# Patient Record
Sex: Male | Born: 1950
Health system: Southern US, Community
[De-identification: ages and names within clinical notes are randomized; demographics above are authoritative.]

## PROBLEM LIST (undated history)

## (undated) VITALS — BP 142/90 | HR 62 | Ht 66.0 in | Wt 197.5 lb

## (undated) DIAGNOSIS — N318 Other neuromuscular dysfunction of bladder: Secondary | ICD-10-CM

## (undated) DIAGNOSIS — I10 Essential (primary) hypertension: Secondary | ICD-10-CM

## (undated) DIAGNOSIS — E785 Hyperlipidemia, unspecified: Secondary | ICD-10-CM

## (undated) DIAGNOSIS — C61 Malignant neoplasm of prostate: Secondary | ICD-10-CM

## (undated) DIAGNOSIS — K219 Gastro-esophageal reflux disease without esophagitis: Secondary | ICD-10-CM

## (undated) DIAGNOSIS — G43009 Migraine without aura, not intractable, without status migrainosus: Secondary | ICD-10-CM

## (undated) DIAGNOSIS — N400 Enlarged prostate without lower urinary tract symptoms: Secondary | ICD-10-CM

## (undated) HISTORY — DX: Migraine without aura, not intractable, without status migrainosus: G43.009

## (undated) HISTORY — DX: Hyperlipidemia, unspecified: E78.5

## (undated) HISTORY — DX: Essential (primary) hypertension: I10

## (undated) HISTORY — DX: Gastro-esophageal reflux disease without esophagitis: K21.9

## (undated) HISTORY — DX: Other neuromuscular dysfunction of bladder: N31.8

## (undated) HISTORY — DX: Benign prostatic hyperplasia without lower urinary tract symptoms: N40.0

---

## 2001-10-01 ENCOUNTER — Encounter: Payer: Self-pay | Admitting: Internal Medicine

## 2001-10-01 ENCOUNTER — Encounter: Admission: RE | Admit: 2001-10-01 | Discharge: 2001-10-01 | Payer: Self-pay | Admitting: Internal Medicine

## 2002-09-11 ENCOUNTER — Ambulatory Visit (HOSPITAL_COMMUNITY): Admission: RE | Admit: 2002-09-11 | Discharge: 2002-09-11 | Payer: Self-pay | Admitting: Gastroenterology

## 2005-12-02 ENCOUNTER — Emergency Department (HOSPITAL_COMMUNITY): Admission: EM | Admit: 2005-12-02 | Discharge: 2005-12-02 | Payer: Self-pay | Admitting: Emergency Medicine

## 2005-12-19 ENCOUNTER — Ambulatory Visit: Payer: Self-pay | Admitting: Internal Medicine

## 2005-12-19 LAB — CONVERTED CEMR LAB: PSA: 1.06 ng/mL

## 2005-12-27 ENCOUNTER — Ambulatory Visit: Payer: Self-pay | Admitting: Internal Medicine

## 2006-01-11 ENCOUNTER — Ambulatory Visit: Payer: Self-pay

## 2006-03-08 ENCOUNTER — Ambulatory Visit: Payer: Self-pay | Admitting: Internal Medicine

## 2006-06-06 ENCOUNTER — Ambulatory Visit: Payer: Self-pay | Admitting: Internal Medicine

## 2007-01-04 ENCOUNTER — Encounter: Payer: Self-pay | Admitting: Internal Medicine

## 2007-01-04 DIAGNOSIS — N410 Acute prostatitis: Secondary | ICD-10-CM

## 2007-01-04 DIAGNOSIS — I10 Essential (primary) hypertension: Secondary | ICD-10-CM

## 2007-01-04 DIAGNOSIS — R0989 Other specified symptoms and signs involving the circulatory and respiratory systems: Secondary | ICD-10-CM | POA: Insufficient documentation

## 2007-01-04 DIAGNOSIS — E7800 Pure hypercholesterolemia, unspecified: Secondary | ICD-10-CM | POA: Insufficient documentation

## 2007-01-04 DIAGNOSIS — E785 Hyperlipidemia, unspecified: Secondary | ICD-10-CM

## 2007-01-04 HISTORY — DX: Hyperlipidemia, unspecified: E78.5

## 2007-01-04 HISTORY — DX: Essential (primary) hypertension: I10

## 2007-01-08 ENCOUNTER — Ambulatory Visit: Payer: Self-pay | Admitting: Internal Medicine

## 2007-01-08 LAB — CONVERTED CEMR LAB
Bilirubin Urine: NEGATIVE
Leukocytes, UA: NEGATIVE
Nitrite: NEGATIVE
Specific Gravity, Urine: 1.01 (ref 1.000–1.03)
Urine Glucose: NEGATIVE mg/dL
Urobilinogen, UA: 0.2 (ref 0.0–1.0)
pH: 7 (ref 5.0–8.0)

## 2007-03-06 ENCOUNTER — Ambulatory Visit: Payer: Self-pay | Admitting: Internal Medicine

## 2007-03-06 ENCOUNTER — Encounter: Payer: Self-pay | Admitting: Internal Medicine

## 2007-03-06 DIAGNOSIS — N318 Other neuromuscular dysfunction of bladder: Secondary | ICD-10-CM

## 2007-03-06 HISTORY — DX: Other neuromuscular dysfunction of bladder: N31.8

## 2007-03-07 LAB — CONVERTED CEMR LAB
ALT: 23 units/L (ref 0–53)
AST: 24 units/L (ref 0–37)
Bacteria, UA: NEGATIVE
Basophils Absolute: 0.1 10*3/uL (ref 0.0–0.1)
Bilirubin Urine: NEGATIVE
Bilirubin, Direct: 0.1 mg/dL (ref 0.0–0.3)
CO2: 31 meq/L (ref 19–32)
Chloride: 102 meq/L (ref 96–112)
Creatinine, Ser: 0.9 mg/dL (ref 0.4–1.5)
Eosinophils Absolute: 0.1 10*3/uL (ref 0.0–0.6)
Eosinophils Relative: 1.4 % (ref 0.0–5.0)
Glucose, Bld: 104 mg/dL — ABNORMAL HIGH (ref 70–99)
HCT: 43.6 % (ref 39.0–52.0)
Hemoglobin: 14.9 g/dL (ref 13.0–17.0)
MCHC: 34.3 g/dL (ref 30.0–36.0)
MCV: 92.6 fL (ref 78.0–100.0)
Monocytes Absolute: 0.6 10*3/uL (ref 0.2–0.7)
Mucus, UA: NEGATIVE
Neutrophils Relative %: 64.4 % (ref 43.0–77.0)
Nitrite: NEGATIVE
PSA: 1.07 ng/mL (ref 0.10–4.00)
RDW: 13.1 % (ref 11.5–14.6)
Sodium: 139 meq/L (ref 135–145)
Total Bilirubin: 0.8 mg/dL (ref 0.3–1.2)
Total Protein, Urine: NEGATIVE mg/dL
Total Protein: 8.2 g/dL (ref 6.0–8.3)
pH: 7 (ref 5.0–8.0)

## 2007-04-01 ENCOUNTER — Telehealth (INDEPENDENT_AMBULATORY_CARE_PROVIDER_SITE_OTHER): Payer: Self-pay | Admitting: *Deleted

## 2007-04-02 ENCOUNTER — Telehealth (INDEPENDENT_AMBULATORY_CARE_PROVIDER_SITE_OTHER): Payer: Self-pay | Admitting: *Deleted

## 2007-05-09 HISTORY — PX: COLONOSCOPY: SHX174

## 2007-05-15 ENCOUNTER — Ambulatory Visit: Payer: Self-pay | Admitting: Internal Medicine

## 2007-06-24 ENCOUNTER — Ambulatory Visit: Payer: Self-pay | Admitting: Cardiovascular Disease

## 2007-06-24 ENCOUNTER — Inpatient Hospital Stay (HOSPITAL_COMMUNITY): Admission: EM | Admit: 2007-06-24 | Discharge: 2007-06-26 | Payer: Self-pay | Admitting: Emergency Medicine

## 2007-07-05 ENCOUNTER — Ambulatory Visit: Payer: Self-pay | Admitting: Internal Medicine

## 2007-07-31 ENCOUNTER — Ambulatory Visit: Payer: Self-pay | Admitting: Internal Medicine

## 2007-07-31 DIAGNOSIS — G43009 Migraine without aura, not intractable, without status migrainosus: Secondary | ICD-10-CM

## 2007-07-31 DIAGNOSIS — K219 Gastro-esophageal reflux disease without esophagitis: Secondary | ICD-10-CM

## 2007-07-31 DIAGNOSIS — R1013 Epigastric pain: Secondary | ICD-10-CM

## 2007-07-31 HISTORY — DX: Gastro-esophageal reflux disease without esophagitis: K21.9

## 2007-07-31 HISTORY — DX: Migraine without aura, not intractable, without status migrainosus: G43.009

## 2007-07-31 LAB — CONVERTED CEMR LAB
Alkaline Phosphatase: 62 units/L (ref 39–117)
Bilirubin, Direct: 0.2 mg/dL (ref 0.0–0.3)
H Pylori IgG: NEGATIVE
LDL Cholesterol: 78 mg/dL (ref 0–99)
Lipase: 19 units/L (ref 11.0–59.0)
Total CHOL/HDL Ratio: 2.5
VLDL: 11 mg/dL (ref 0–40)

## 2007-08-22 ENCOUNTER — Ambulatory Visit: Payer: Self-pay

## 2007-08-22 ENCOUNTER — Ambulatory Visit: Payer: Self-pay | Admitting: Cardiovascular Disease

## 2007-10-07 ENCOUNTER — Ambulatory Visit: Payer: Self-pay | Admitting: Internal Medicine

## 2007-10-22 ENCOUNTER — Encounter: Payer: Self-pay | Admitting: Internal Medicine

## 2007-10-22 ENCOUNTER — Ambulatory Visit: Payer: Self-pay | Admitting: Internal Medicine

## 2007-10-23 ENCOUNTER — Encounter: Payer: Self-pay | Admitting: Internal Medicine

## 2008-04-13 ENCOUNTER — Ambulatory Visit: Payer: Self-pay | Admitting: Internal Medicine

## 2008-04-13 LAB — CONVERTED CEMR LAB
Albumin: 4.2 g/dL (ref 3.5–5.2)
Alkaline Phosphatase: 61 units/L (ref 39–117)
BUN: 12 mg/dL (ref 6–23)
Basophils Absolute: 0 10*3/uL (ref 0.0–0.1)
Basophils Relative: 0.5 % (ref 0.0–3.0)
Calcium: 9.6 mg/dL (ref 8.4–10.5)
Cholesterol: 182 mg/dL (ref 0–200)
Creatinine, Ser: 0.9 mg/dL (ref 0.4–1.5)
Crystals: NEGATIVE
Eosinophils Absolute: 0.1 10*3/uL (ref 0.0–0.7)
Eosinophils Relative: 0.8 % (ref 0.0–5.0)
GFR calc Af Amer: 111 mL/min
GFR calc non Af Amer: 92 mL/min
Glucose, Bld: 99 mg/dL (ref 70–99)
HCT: 42 % (ref 39.0–52.0)
HDL: 70.3 mg/dL (ref >39.0)
Ketones, ur: NEGATIVE mg/dL
Leukocytes, UA: NEGATIVE
MCHC: 34.6 g/dL (ref 30.0–36.0)
MCV: 93.3 fL (ref 78.0–100.0)
Monocytes Absolute: 0.6 10*3/uL (ref 0.1–1.0)
Neutrophils Relative %: 63.4 % (ref 43.0–77.0)
PSA: 1.21 ng/mL (ref 0.10–4.00)
Platelets: 254 10*3/uL (ref 150–400)
Potassium: 4.2 meq/L (ref 3.5–5.1)
RBC: 4.5 M/uL (ref 4.22–5.81)
Specific Gravity, Urine: 1.01 (ref 1.000–1.03)
Total Protein: 7.8 g/dL (ref 6.0–8.3)
Urine Glucose: NEGATIVE mg/dL
Urobilinogen, UA: 0.2 (ref 0.0–1.0)
VLDL: 14 mg/dL (ref 0–40)
WBC: 8.2 10*3/uL (ref 4.5–10.5)

## 2008-04-20 ENCOUNTER — Encounter: Payer: Self-pay | Admitting: Internal Medicine

## 2008-04-20 ENCOUNTER — Ambulatory Visit: Payer: Self-pay | Admitting: Internal Medicine

## 2008-11-30 ENCOUNTER — Ambulatory Visit: Payer: Self-pay | Admitting: Internal Medicine

## 2008-11-30 DIAGNOSIS — R350 Frequency of micturition: Secondary | ICD-10-CM

## 2008-11-30 DIAGNOSIS — L989 Disorder of the skin and subcutaneous tissue, unspecified: Secondary | ICD-10-CM | POA: Insufficient documentation

## 2008-11-30 LAB — CONVERTED CEMR LAB
Albumin: 4.3 g/dL (ref 3.5–5.2)
BUN: 14 mg/dL (ref 6–23)
Bilirubin Urine: NEGATIVE
CO2: 30 meq/L (ref 19–32)
Chloride: 106 meq/L (ref 96–112)
Cholesterol: 201 mg/dL — ABNORMAL HIGH (ref 0–200)
Direct LDL: 115.6 mg/dL
Nitrite: NEGATIVE
Potassium: 3.9 meq/L (ref 3.5–5.1)
Total CHOL/HDL Ratio: 3
Total Protein, Urine: 30 mg/dL
Triglycerides: 74 mg/dL (ref 0.0–149.0)
Urobilinogen, UA: 1 (ref 0.0–1.0)
VLDL: 14.8 mg/dL (ref 0.0–40.0)

## 2008-12-16 ENCOUNTER — Encounter: Payer: Self-pay | Admitting: Internal Medicine

## 2009-04-29 ENCOUNTER — Ambulatory Visit: Payer: Self-pay | Admitting: Internal Medicine

## 2009-04-29 LAB — CONVERTED CEMR LAB
Albumin: 4.4 g/dL (ref 3.5–5.2)
Alkaline Phosphatase: 61 units/L (ref 39–117)
BUN: 9 mg/dL (ref 6–23)
Basophils Absolute: 0 10*3/uL (ref 0.0–0.1)
CO2: 29 meq/L (ref 19–32)
Calcium: 9.4 mg/dL (ref 8.4–10.5)
Creatinine, Ser: 1 mg/dL (ref 0.4–1.5)
Eosinophils Absolute: 0 10*3/uL (ref 0.0–0.7)
Glucose, Bld: 100 mg/dL — ABNORMAL HIGH (ref 70–99)
HDL: 67 mg/dL (ref >39.00)
Lymphocytes Relative: 32.8 % (ref 12.0–46.0)
MCHC: 33.1 g/dL (ref 30.0–36.0)
Neutro Abs: 4.2 10*3/uL (ref 1.4–7.7)
Neutrophils Relative %: 57.9 % (ref 43.0–77.0)
Nitrite: NEGATIVE
Platelets: 271 10*3/uL (ref 150.0–400.0)
RDW: 13.3 % (ref 11.5–14.6)
Total Protein, Urine: NEGATIVE mg/dL
Triglycerides: 68 mg/dL (ref 0.0–149.0)
Urobilinogen, UA: 0.2 (ref 0.0–1.0)

## 2009-05-05 ENCOUNTER — Ambulatory Visit: Payer: Self-pay | Admitting: Internal Medicine

## 2009-06-14 ENCOUNTER — Ambulatory Visit: Payer: Self-pay | Admitting: Internal Medicine

## 2009-06-14 DIAGNOSIS — J069 Acute upper respiratory infection, unspecified: Secondary | ICD-10-CM | POA: Insufficient documentation

## 2009-06-14 DIAGNOSIS — R062 Wheezing: Secondary | ICD-10-CM

## 2009-06-22 ENCOUNTER — Encounter: Payer: Self-pay | Admitting: Internal Medicine

## 2009-07-28 ENCOUNTER — Ambulatory Visit: Payer: Self-pay | Admitting: Internal Medicine

## 2009-07-28 DIAGNOSIS — R109 Unspecified abdominal pain: Secondary | ICD-10-CM | POA: Insufficient documentation

## 2009-07-28 DIAGNOSIS — R0602 Shortness of breath: Secondary | ICD-10-CM

## 2009-07-28 LAB — CONVERTED CEMR LAB
ALT: 29 units/L (ref 0–53)
Alkaline Phosphatase: 60 units/L (ref 39–117)
Basophils Relative: 0.6 % (ref 0.0–3.0)
Bilirubin, Direct: 0.1 mg/dL (ref 0.0–0.3)
Calcium: 9.5 mg/dL (ref 8.4–10.5)
Chloride: 104 meq/L (ref 96–112)
Creatinine, Ser: 0.9 mg/dL (ref 0.4–1.5)
Eosinophils Relative: 0.5 % (ref 0.0–5.0)
GFR calc non Af Amer: 110.25 mL/min (ref >60)
Lymphocytes Relative: 36.5 % (ref 12.0–46.0)
Monocytes Relative: 8.6 % (ref 3.0–12.0)
Neutrophils Relative %: 53.8 % (ref 43.0–77.0)
RBC: 4.58 M/uL (ref 4.22–5.81)
Total Protein: 7.7 g/dL (ref 6.0–8.3)
WBC: 7.2 10*3/uL (ref 4.5–10.5)

## 2009-08-03 ENCOUNTER — Encounter: Admission: RE | Admit: 2009-08-03 | Discharge: 2009-08-03 | Payer: Self-pay | Admitting: Internal Medicine

## 2010-02-28 ENCOUNTER — Encounter: Payer: Self-pay | Admitting: Internal Medicine

## 2010-04-29 ENCOUNTER — Ambulatory Visit: Payer: Self-pay | Admitting: Internal Medicine

## 2010-05-03 LAB — CONVERTED CEMR LAB
Alkaline Phosphatase: 70 units/L (ref 39–117)
Basophils Absolute: 0 10*3/uL (ref 0.0–0.1)
Bilirubin Urine: NEGATIVE
Bilirubin, Direct: 0.1 mg/dL (ref 0.0–0.3)
Calcium: 9.6 mg/dL (ref 8.4–10.5)
Cholesterol: 246 mg/dL — ABNORMAL HIGH (ref 0–200)
Creatinine, Ser: 1 mg/dL (ref 0.4–1.5)
Direct LDL: 164.8 mg/dL
Eosinophils Absolute: 0 10*3/uL (ref 0.0–0.7)
GFR calc non Af Amer: 103.32 mL/min (ref >60.00)
HCT: 45.4 % (ref 39.0–52.0)
HDL: 66.7 mg/dL (ref >39.00)
Ketones, ur: NEGATIVE mg/dL
Leukocytes, UA: NEGATIVE
Lymphs Abs: 2.4 10*3/uL (ref 0.7–4.0)
MCHC: 34.3 g/dL (ref 30.0–36.0)
MCV: 93.4 fL (ref 78.0–100.0)
Monocytes Absolute: 0.6 10*3/uL (ref 0.1–1.0)
Monocytes Relative: 7.3 % (ref 3.0–12.0)
PSA: 2.23 ng/mL (ref 0.10–4.00)
Platelets: 309 10*3/uL (ref 150.0–400.0)
RDW: 14.6 % (ref 11.5–14.6)
Sodium: 139 meq/L (ref 135–145)
Total CHOL/HDL Ratio: 4
Triglycerides: 85 mg/dL (ref 0.0–149.0)
Urine Glucose: NEGATIVE mg/dL
Urobilinogen, UA: 0.2 (ref 0.0–1.0)
pH: 8 (ref 5.0–8.0)

## 2010-05-06 ENCOUNTER — Ambulatory Visit: Payer: Self-pay | Admitting: Internal Medicine

## 2010-05-06 ENCOUNTER — Encounter: Payer: Self-pay | Admitting: Internal Medicine

## 2010-05-06 DIAGNOSIS — M549 Dorsalgia, unspecified: Secondary | ICD-10-CM | POA: Insufficient documentation

## 2010-05-06 DIAGNOSIS — N400 Enlarged prostate without lower urinary tract symptoms: Secondary | ICD-10-CM

## 2010-05-06 DIAGNOSIS — E2839 Other primary ovarian failure: Secondary | ICD-10-CM

## 2010-05-06 HISTORY — DX: Benign prostatic hyperplasia without lower urinary tract symptoms: N40.0

## 2010-06-07 NOTE — Letter (Signed)
Summary: [facility]   Imported By: Sherian Rein 03/07/2010 08:16:36  _____________________________________________________________________  External Attachment:    Type:   Image     Comment:   External Document

## 2010-06-07 NOTE — Assessment & Plan Note (Signed)
Summary: COUGH/ CONGESTION /NWS  #   Vital Signs:  Patient profile:   60 year old male Height:      67 inches Weight:      198 pounds BMI:     31.12 O2 Sat:      96 % on Room air Temp:     97.6 degrees F oral Pulse rate:   65 / minute BP sitting:   122 / 78  (left arm) Cuff size:   regular  Vitals Entered ByZella Ball Ewing (June 14, 2009 2:27 PM)  O2 Flow:  Room air  CC: chest congestion,cough/RE   CC:  chest congestion and cough/RE.  History of Present Illness: here after being seen in urgent care last wk with URI and seems to be improving after zpack , but still with signficant cough and today some mild wheezing, but Pt denies CP, sob, doe, orthopnea, pnd, worsening LE edema, palps, dizziness or syncope .  Pt denies new neuro symptoms such as headache, facial or extremity weakness   Overall good compliance with meds, tolerating well.    Problems Prior to Update: 1)  Wheezing  (ICD-786.07) 2)  Uri  (ICD-465.9) 3)  Frequency, Urinary  (ICD-788.41) 4)  Skin Lesion  (ICD-709.9) 5)  Preventive Health Care  (ICD-V70.0) 6)  Hepatotoxicity, Drug-induced, Risk of  (ICD-V58.69) 7)  Abdominal Pain, Epigastric  (ICD-789.06) 8)  Common Migraine  (ICD-346.10) 9)  Gerd  (ICD-530.81) 10)  Preventive Health Care  (ICD-V70.0) 11)  Overactive Bladder  (ICD-596.51) 12)  Family History Diabetes 1st Degree Relative  (ICD-V18.0) 13)  Prostatitis, Acute  (ICD-601.0) 14)  Hypertension  (ICD-401.9) 15)  Hyperlipidemia  (ICD-272.4)  Medications Prior to Update: 1)  Ecotrin Low Strength 81 Mg  Tbec (Aspirin) .Marland Kitchen.. 1 By Mouth Qd 2)  Amlodipine Besylate 10 Mg Tabs (Amlodipine Besylate) .Marland Kitchen.. 1 By Mouth Once Daily 3)  Crestor 40 Mg Tabs (Rosuvastatin Calcium) .... 1/2  By Mouth Once Daily 4)  Miralax  Powd (Polyethylene Glycol 3350) .... Use Asd 5)  Benazepril Hcl 20 Mg Tabs (Benazepril Hcl) .Marland Kitchen.. 1po  Once Daily  Current Medications (verified): 1)  Ecotrin Low Strength 81 Mg  Tbec (Aspirin)  .Marland Kitchen.. 1 By Mouth Qd 2)  Amlodipine Besylate 10 Mg Tabs (Amlodipine Besylate) .Marland Kitchen.. 1 By Mouth Once Daily 3)  Crestor 40 Mg Tabs (Rosuvastatin Calcium) .... 1/2  By Mouth Once Daily 4)  Miralax  Powd (Polyethylene Glycol 3350) .... Use Asd 5)  Benazepril Hcl 20 Mg Tabs (Benazepril Hcl) .Marland Kitchen.. 1po  Once Daily 6)  Prednisone 10 Mg Tabs (Prednisone) .... 3po Qd For 3days, Then 2po Qd For 3days, Then 1po Qd For 3days, Then Stop  Allergies (verified): 1)  ! 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   Head:  normocephalic and atraumatic.   Eyes:  vision grossly intact, pupils equal, and pupils round.   Ears:  bilat tm's mild erythema, sinus nontender Nose:  nasal dischargemucosal pallor and mucosal erythema.   Mouth:  pharyngeal erythema and fair dentition.   Neck:  supple  and no masses.   Lungs:  normal respiratory effort, R decreased breath sounds, R wheezes, L decreased breath sounds, and L wheezes.   Heart:  normal rate and regular rhythm.   Extremities:  no edema, no erythema    Impression & Recommendations:  Problem # 1:  URI (ICD-465.9)  His updated medication list for this problem includes:    Ecotrin Low Strength 81 Mg Tbec (Aspirin) .Marland Kitchen... 1 by mouth qd resolving, prob viral, ok for fluids, and tyelnol as needed   Problem # 2:  WHEEZING (ICD-786.07)  mild , likely due to above, for low dose prednisone burst and taper off, for cxr today, and f/u any worsening s/s  Orders: T-2 View CXR, Same Day  (71020.5TC)  Problem # 3:  HYPERTENSION (ICD-401.9)  His updated medication list for this problem includes:    Amlodipine Besylate 10 Mg Tabs (Amlodipine besylate) .Marland Kitchen... 1 by mouth once daily    Benazepril Hcl 20 Mg Tabs (Benazepril hcl) .Marland Kitchen... 1po  once daily  BP today: 122/78 Prior BP: 122/80 (05/05/2009)  Labs Reviewed: K+: 3.9 (04/29/2009) Creat: : 1.0 (04/29/2009)   Chol: 159 (04/29/2009)   HDL: 67.00 (04/29/2009)   LDL: 78 (04/29/2009)   TG: 68.0 (04/29/2009) stable overall by hx and exam, ok to continue meds/tx as is   Complete Medication List: 1)  Ecotrin Low Strength 81 Mg Tbec (Aspirin) .Marland Kitchen.. 1 by mouth qd 2)  Amlodipine Besylate 10 Mg Tabs (Amlodipine besylate) .Marland Kitchen.. 1 by mouth once daily 3)  Crestor 40 Mg Tabs (Rosuvastatin calcium) .... 1/2  by mouth once daily 4)  Miralax Powd (Polyethylene glycol 3350) .... Use asd 5)  Benazepril Hcl 20 Mg Tabs (Benazepril hcl) .Marland Kitchen.. 1po  once daily 6)  Prednisone 10 Mg Tabs (Prednisone) .... 3po qd for 3days, then 2po qd for 3days, then 1po qd for 3days, then stop  Patient Instructions: 1)  Please take all new medications as prescribed 2)  Continue all previous medications as before this visit  3)  Please go to Radiology in the basement level for your X-Ray today  4)  Please schedule a follow-up appointment as needed. Prescriptions: PREDNISONE 10 MG TABS (PREDNISONE) 3po qd for 3days, then 2po qd for 3days, then 1po qd for 3days, then stop  #18 x 0   Entered and Authorized by:   Corwin Levins MD   Signed by:   Corwin Levins MD on 06/14/2009   Method used:   Print then Give to Patient   RxID:   [id number]    Immunization History:  Influenza Immunization History:    Influenza:  historical (02/05/2009)

## 2010-06-07 NOTE — Assessment & Plan Note (Signed)
Summary: BLOATING FEELING--STC   Vital Signs:  Patient profile:   60 year old male Height:      68 inches Weight:      203.25 pounds BMI:     31.02 O2 Sat:      97 % on Room air Temp:     98.1 degrees F oral Pulse rate:   62 / minute BP sitting:   112 / 70  (left arm) Cuff size:   large  Vitals Entered ByZella Ball Ewing (July 28, 2009 1:27 PM)  O2 Flow:  Room air  CC: Stomach bloating problems/RE   CC:  Stomach bloating problems/RE.  History of Present Illness: here with abd bloating and discomfort, feels full, has been trying to diet, by chart has gained about 5 lbs;  no n/v, dysphagia, but has been significantly constipated  - saw urgent care on a sat night 3 wks ago - tx with what sounds like golytely but he states did not realy help, but the herbal prep he to OTC seemed to help, though still "stuffed and bloated."  NO  abd pain but more pressure like, even up to the ribs.  Also with some sob - has to take a deeper breath to sing, and has occasional cough with this, but no other sob or cough;  Pt denies other CP, wheezing, orthopnea, pnd, worsening LE edema, palps, dizziness or syncope, but has had some mild DOE but no cough, fever.   No blood from stool.  No fever or feels hot and cold.  Has been on crestor for quite some time without constipation in the past  Problems Prior to Update: 1)  Dyspnea  (ICD-786.05) 2)  Abdominal Pain, Upper  (ICD-789.09) 3)  Wheezing  (ICD-786.07) 4)  Uri  (ICD-465.9) 5)  Frequency, Urinary  (ICD-788.41) 6)  Skin Lesion  (ICD-709.9) 7)  Preventive Health Care  (ICD-V70.0) 8)  Hepatotoxicity, Drug-induced, Risk of  (ICD-V58.69) 9)  Abdominal Pain, Epigastric  (ICD-789.06) 10)  Common Migraine  (ICD-346.10) 11)  Gerd  (ICD-530.81) 12)  Preventive Health Care  (ICD-V70.0) 13)  Overactive Bladder  (ICD-596.51) 14)  Family History Diabetes 1st Degree Relative  (ICD-V18.0) 15)  Prostatitis, Acute  (ICD-601.0) 16)  Hypertension  (ICD-401.9) 17)   Hyperlipidemia  (ICD-272.4)  Medications Prior to Update: 1)  Ecotrin Low Strength 81 Mg  Tbec (Aspirin) .Marland Kitchen.. 1 By Mouth Qd 2)  Amlodipine Besylate 10 Mg Tabs (Amlodipine Besylate) .Marland Kitchen.. 1 By Mouth Once Daily 3)  Crestor 40 Mg Tabs (Rosuvastatin Calcium) .... 1/2  By Mouth Once Daily 4)  Miralax  Powd (Polyethylene Glycol 3350) .... Use Asd 5)  Benazepril Hcl 20 Mg Tabs (Benazepril Hcl) .Marland Kitchen.. 1po  Once Daily 6)  Prednisone 10 Mg Tabs (Prednisone) .... 3po Qd For 3days, Then 2po Qd For 3days, Then 1po Qd For 3days, Then Stop  Current Medications (verified): 1)  Ecotrin Low Strength 81 Mg  Tbec (Aspirin) .Marland Kitchen.. 1 By Mouth Qd 2)  Amlodipine Besylate 10 Mg Tabs (Amlodipine Besylate) .Marland Kitchen.. 1 By Mouth Once Daily 3)  Crestor 40 Mg Tabs (Rosuvastatin Calcium) .... 1/2  By Mouth Once Daily 4)  Miralax  Powd (Polyethylene Glycol 3350) .... Use Asd 5)  Benazepril Hcl 20 Mg Tabs (Benazepril Hcl) .Marland Kitchen.. 1po  Once Daily 6)  Prednisone 10 Mg Tabs (Prednisone) .... 3po Qd For 3days, Then 2po Qd For 3days, Then 1po Qd For 3days, Then Stop  Allergies (verified): 1)  ! 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   Head:  normocephalic and atraumatic.   Eyes:  vision grossly intact, pupils equal, and pupils round.   Ears:  R ear normal and L ear normal.   Nose:  no external deformity and no nasal discharge.   Mouth:  no gingival abnormalities and pharynx pink and moist.   Neck:  supple and no masses.   Lungs:  normal respiratory  effort and normal breath sounds.   Heart:  normal rate and regular rhythm.   Abdomen:  soft, non-tender, normal bowel sounds, no masses, no guarding, and no rigidity.   Msk:  no joint tenderness and no joint swelling.   Extremities:  no edema, no erythema    Impression & Recommendations:  Problem # 1:  ABDOMINAL PAIN, UPPER (ICD-789.09)  His updated medication list for this problem includes:    Ecotrin Low Strength 81 Mg Tbec (Aspirin) .Marland Kitchen... 1 by mouth qd and generalized - for miralax daily and as needed senakot/mag citrate, check labs, and given the wt given and increased abd girth will check u/s - r/o ascites or other;  consider GI  Orders: Radiology Referral (Radiology) TLB-BMP (Basic Metabolic Panel-BMET) (80048-METABOL) TLB-CBC Platelet - w/Differential (85025-CBCD) TLB-Hepatic/Liver Function Pnl (80076-HEPATIC) TLB-Lipase (83690-LIPASE)  Problem # 2:  HYPERLIPIDEMIA (ICD-272.4)  His updated medication list for this problem includes:    Crestor 40 Mg Tabs (Rosuvastatin calcium) .Marland Kitchen... 1/2  by mouth once daily consider trial off the crestor if does not improve  Problem # 3:  DYSPNEA (ICD-786.05) etiology unclear, exam bening, no evidence for bronchitis or other at this time;  follow with expectant management,  ? anxiety  Problem # 4:  HYPERTENSION (ICD-401.9)  His updated medication list for this problem includes:    Amlodipine Besylate 10 Mg Tabs (Amlodipine besylate) .Marland Kitchen... 1 by mouth once daily    Benazepril Hcl 20 Mg Tabs (Benazepril hcl) .Marland Kitchen... 1po  once daily stable overall by hx and exam, ok to continue meds/tx as is   BP today: 112/70 Prior BP: 122/78 (06/14/2009)  Labs Reviewed: K+: 3.9 (04/29/2009) Creat: : 1.0 (04/29/2009)   Chol: 159 (04/29/2009)   HDL: 67.00 (04/29/2009)   LDL: 78 (04/29/2009)   TG: 68.0 (04/29/2009)  Complete Medication List: 1)  Ecotrin Low Strength 81 Mg Tbec (Aspirin) .Marland Kitchen.. 1 by mouth qd 2)  Amlodipine Besylate 10 Mg Tabs (Amlodipine  besylate) .Marland Kitchen.. 1 by mouth once daily 3)  Crestor 40 Mg Tabs (Rosuvastatin calcium) .... 1/2  by mouth once daily 4)  Miralax Powd (Polyethylene glycol 3350) .... Use asd 5)  Benazepril Hcl 20 Mg Tabs (Benazepril hcl) .Marland Kitchen.. 1po  once daily 6)  Prednisone 10 Mg Tabs (Prednisone) .... 3po qd for 3days, then 2po qd for 3days, then 1po qd for 3days, then stop  Anticoagulation Management Assessment/Plan:             Patient Instructions: 1)  please take the miralax every day 2)  you can also take senakot 1 per day, and/or magnesium citrate for the constipation as well 3)  Continue all previous medications  as before this visit 4)  Please schedule a follow-up appointment in Dec 2011 with CPX labs , or sooner if needed

## 2010-06-09 NOTE — Assessment & Plan Note (Signed)
Summary: PHYSICAL--STC   Vital Signs:  Patient profile:   60 year old male Height:      66 inches Weight:      197.50 pounds BMI:     31.99 O2 Sat:      97 % on Room air Temp:     97.6 degrees F oral Pulse rate:   62 / minute BP sitting:   142 / 90  (left arm) Cuff size:   regular  Vitals Entered By: Zella Ball Ewing CMA Duncan Dull) (May 06, 2010 8:34 AM)  O2 Flow:  Room air  Preventive Care Screening     had the flu shot earlier this season  CC: Adult Physical/RE   CC:  Adult Physical/RE.  History of Present Illness: here for wellness, overall doing well;  Pt denies CP, worsening sob, doe, wheezing, orthopnea, pnd, worsening LE edema, palps, dizziness or syncope  Pt denies new neuro symptoms such as headache, facial or extremity weakness  Pt denies polydipsia, polyuria   Overall good compliance with meds, trying to follow low chol diet, wt stable, little excercise however.  No fever, wt loss, night sweats, loss of appetite or other constitutional symptoms  Overall good compliance with meds, and good tolerability., except forgot to take BP med today, and has been out of the statin for 3 wks.  Denies worsening depressive symptoms, suicidal ideation, or panic.   Pt states good ability with ADL's, low fall risk, home safety reviewed and adequate, no significant change in hearing or vision, trying to follow lower chol diet, and occasionally active only with regular excercise.    Also, has had some some recurring LBP for 4 wks, mild to mod, intermittent,  across the lower back but can radiate to the bilat beltlime level, no LE pain/weaknumbness,  fever, wt lossw , bowel or bladder changes,  gait change or falls or injury.  Had similar pain in the past but gas OTC meds seemed to help , but not this time.  Bending or twisting or standing does not make worse.  Seems to be more with lyign down,  but no stiffness in the am.    Preventive Screening-Counseling & Management      Drug Use:  no.     Problems Prior to Update: 1)  Back Pain  (ICD-724.5) 2)  Benign Prostatic Hypertrophy  (ICD-600.00) 3)  Dyspnea  (ICD-786.05) 4)  Abdominal Pain, Upper  (ICD-789.09) 5)  Wheezing  (ICD-786.07) 6)  Uri  (ICD-465.9) 7)  Frequency, Urinary  (ICD-788.41) 8)  Skin Lesion  (ICD-709.9) 9)  Preventive Health Care  (ICD-V70.0) 10)  Hepatotoxicity, Drug-induced, Risk of  (ICD-V58.69) 11)  Abdominal Pain, Epigastric  (ICD-789.06) 12)  Common Migraine  (ICD-346.10) 13)  Gerd  (ICD-530.81) 14)  Preventive Health Care  (ICD-V70.0) 15)  Overactive Bladder  (ICD-596.51) 16)  Family History Diabetes 1st Degree Relative  (ICD-V18.0) 17)  Prostatitis, Acute  (ICD-601.0) 18)  Hypertension  (ICD-401.9) 19)  Hyperlipidemia  (ICD-272.4)  Medications Prior to Update: 1)  Ecotrin Low Strength 81 Mg  Tbec (Aspirin) .Marland Kitchen.. 1 By Mouth Qd 2)  Amlodipine Besylate 10 Mg Tabs (Amlodipine Besylate) .Marland Kitchen.. 1 By Mouth Once Daily 3)  Crestor 40 Mg Tabs (Rosuvastatin Calcium) .... 1/2  By Mouth Once Daily 4)  Miralax  Powd (Polyethylene Glycol 3350) .... Use Asd 5)  Benazepril Hcl 20 Mg Tabs (Benazepril Hcl) .Marland Kitchen.. 1po  Once Daily 6)  Prednisone 10 Mg Tabs (Prednisone) .... 3po Qd For 3days, Then 2po Qd For  3days, Then 1po Qd For 3days, Then Stop  Current Medications (verified): 1)  Ecotrin Low Strength 81 Mg  Tbec (Aspirin) .Marland Kitchen.. 1 By Mouth Qd 2)  Amlodipine Besylate 10 Mg Tabs (Amlodipine Besylate) .Marland Kitchen.. 1 By Mouth Once Daily 3)  Crestor 40 Mg Tabs (Rosuvastatin Calcium) .Marland Kitchen.. 1  By Mouth Once Daily 4)  Miralax  Powd (Polyethylene Glycol 3350) .... Use Asd 5)  Benazepril Hcl 20 Mg Tabs (Benazepril Hcl) .Marland Kitchen.. 1po  Once Daily 6)  Diclofenac Sodium 75 Mg Tbec (Diclofenac Sodium) .Marland Kitchen.. 1 By Mouth Two Times A Day As Needed For Pain  Allergies (verified): 1)  ! 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 Drug use-no  Risk Factors: Smoking Status: never (03/06/2007)  Past Medical History: Hyperlipidemia Hypertension Prostatitis OAB low vit D GERD/chronic constipation Benign prostatic hypertrophy  Social History: Never Smoked Alcohol use-no 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         all otherwise negative per pt -    Physical Exam  General:  alert and overweight-appearing.   Head:  normocephalic and atraumatic.   Eyes:  vision grossly intact, pupils equal, and pupils round.   Ears:  R ear normal and L ear normal.   Nose:  no external deformity and no nasal discharge.   Mouth:  no gingival abnormalities and pharynx pink and moist.   Neck:  supple and no masses.   Lungs:  normal respiratory effort and normal breath sounds.   Heart:  normal rate and regular rhythm.   Abdomen:  soft, non-tender, normal bowel sounds, no masses, no guarding, and no rigidity.   Msk:  no joint tenderness and no joint swelling.  , spine nontender Extremities:  no edema, no erythema  Neurologic:  cranial nerves II-XII intact and strength normal in all extremities.  sensation intact to light touch and DTRs symmetrical and normal.   Skin:  color normal and no rashes.     Psych:  not depressed appearing and slightly anxious.     Impression & Recommendations:  Problem # 1:  Preventive Health Care (ICD-V70.0) Overall doing well, age appropriate education and counseling updated, referral for preventive services and immunizations addressed, dietary counseling and smoking status adressed , most recent labs reviewed, ecg reviewed I have personally reviewed and have noted 1.The patient's medical and social history 2.Their use of alcohol, tobacco or illicit drugs 3.Their current medications and supplements 4. Functional ability including ADL's, fall risk, home safety risk, hearing & visual impairment 5.Diet and physical activities 6.Evidence for depression or mood disorders The patients weight, height, BMI  have been recorded in the chart I have made referrals, counseling and provided education to the patient based review of the above  Orders: EKG w/ Interpretation (93000)  Problem # 2:  HYPERTENSION (ICD-401.9) Assessment: Deteriorated  His updated  medication list for this problem includes:    Amlodipine Besylate 10 Mg Tabs (Amlodipine besylate) .Marland Kitchen... 1 by mouth once daily    Benazepril Hcl 20 Mg Tabs (Benazepril hcl) .Marland Kitchen... 1po  once daily  BP today: 142/90 Prior BP: 112/70 (07/28/2009)  Labs Reviewed: K+: 4.3 (05/03/2010) Creat: : 1.0 (05/03/2010)   Chol: 246 (05/03/2010)   HDL: 66.70 (05/03/2010)   LDL: 78 (04/29/2009)   TG: 85.0 (05/03/2010) elev today due to out of meds - to re-start  Problem # 3:  HYPERLIPIDEMIA (ICD-272.4)  His updated medication list for this problem includes:    Crestor 40 Mg Tabs (Rosuvastatin calcium) .Marland Kitchen... 1  by mouth once daily  Labs Reviewed: SGOT: 29 (05/03/2010)   SGPT: 29 (05/03/2010)   HDL:66.70 (05/03/2010), 67.00 (04/29/2009)  LDL:78 (04/29/2009), 98 (04/13/2008)  Chol:246 (05/03/2010), 159 (04/29/2009)  Trig:85.0 (05/03/2010), 68.0 (04/29/2009) LDL elev due to out of med - to re-start med, asked pt to go ahead  and call for refills as he needs instead of waiting for the appt, to avoid interruption in tx  Problem # 4:  BACK PAIN (ICD-724.5)  His updated medication list for this problem includes:    Ecotrin Low Strength 81 Mg Tbec (Aspirin) .Marland Kitchen... 1 by mouth qd    Diclofenac Sodium 75 Mg Tbec (Diclofenac sodium) .Marland Kitchen... 1 by mouth two times a day as needed for pain  Discussed use of moist heat or ice, modified activities, medications, and stretching/strengthening exercises. Back care instructions given. To be seen in 2 weeks if no improvement; sooner if worsening of symptoms.   treat as above, f/u any worsening signs or symptoms   Complete Medication List: 1)  Ecotrin Low Strength 81 Mg Tbec (Aspirin) .Marland Kitchen.. 1 by mouth qd 2)  Amlodipine Besylate 10 Mg Tabs (Amlodipine besylate) .Marland Kitchen.. 1 by mouth once daily 3)  Crestor 40 Mg Tabs (Rosuvastatin calcium) .Marland Kitchen.. 1  by mouth once daily 4)  Miralax Powd (Polyethylene glycol 3350) .... Use asd 5)  Benazepril Hcl 20 Mg Tabs (Benazepril hcl) .Marland Kitchen.. 1po  once daily 6)  Diclofenac Sodium 75 Mg Tbec (Diclofenac sodium) .Marland Kitchen.. 1 by mouth two times a day as needed for pain  Other Orders: Zoster (Shingles) Vaccine Live [phone]) Admin 1st Vaccine (60454)  Patient Instructions: 1)  you had the shingles shot today 2)  Please take all new medications as prescribed  - the new antiinflammatory for pain for as needed only 3)  Continue all previous medications as before this visit  - you are given the 30 days and 90 days prescriptions for all today 4)  Remember, only take HALF of the crestor (to save money) 5)  Please schedule a follow-up appointment in 1 year, or sooner if needed Prescriptions: DICLOFENAC SODIUM 75 MG TBEC (DICLOFENAC SODIUM) 1 by mouth two times a day as needed for pain  #180 x 3   Entered and Authorized by:   Corwin Levins MD   Signed by:   Corwin Levins MD on 05/06/2010   Method used:   Print then Give to Patient   RxID:   [id number] DICLOFENAC SODIUM  75 MG TBEC (DICLOFENAC SODIUM) 1 by mouth two times a day as needed for pain  #60 x 11   Entered and Authorized by:   Corwin Levins MD   Signed by:   Corwin Levins MD on 05/06/2010   Method used:   Print then Give to Patient   RxID:   [id number] BENAZEPRIL HCL 20  MG TABS (BENAZEPRIL HCL) 1po  once daily  #30 x 11   Entered and Authorized by:   Corwin Levins MD   Signed by:   Corwin Levins MD on 05/06/2010   Method used:   Print then Give to Patient   RxID:   [id number] BENAZEPRIL HCL 20 MG TABS (BENAZEPRIL HCL) 1po  once daily  #90 x 3   Entered and Authorized by:   Corwin Levins MD   Signed by:   Corwin Levins MD on 05/06/2010   Method used:   Print then Give to Patient   RxID:   [id number] CRESTOR 40 MG TABS (ROSUVASTATIN CALCIUM) 1  by mouth once daily  #30 x 11   Entered and Authorized by:   Corwin Levins MD   Signed by:   Corwin Levins MD on 05/06/2010   Method used:   Print then Give to Patient   RxID:   [id number] CRESTOR 40 MG TABS (ROSUVASTATIN CALCIUM) 1  by mouth once daily  #90 x 3   Entered and Authorized by:   Corwin Levins MD   Signed by:   Corwin Levins MD on 05/06/2010   Method used:   Print then Give to Patient   RxID:   [id number] AMLODIPINE BESYLATE 10 MG TABS (AMLODIPINE BESYLATE) 1 by mouth once daily  #30 x 11   Entered and Authorized by:   Corwin Levins MD   Signed by:   Corwin Levins MD on 05/06/2010   Method used:   Print then Give to Patient   RxID:   [id number] AMLODIPINE BESYLATE 10 MG TABS (AMLODIPINE BESYLATE) 1 by mouth once daily  #90 x 3   Entered and Authorized by:   Corwin Levins MD   Signed by:   Corwin Levins MD on 05/06/2010   Method used:   Print then Give to Patient   RxID:   [id number]    Orders Added: 1)  EKG w/ Interpretation [93000] 2)  Zoster (Shingles) Vaccine Live [90736] 3)  Admin 1st Vaccine [25427]   Immunizations Administered:  Zostavax # 1:    Vaccine Type: Zostavax    Site: right  deltoid    Mfr: Merck    Dose: 0.5 ml    Route: [place]    Given by: Zella Ball Ewing CMA (AAMA)    Exp. Date: 12/24/2010    Lot #: [id number]    VIS given: 02/17/05 given May 06, 2010.   Immunizations Administered:  Zostavax # 1:    Vaccine Type: Zostavax    Site: right deltoid    Mfr: Merck    Dose: 0.5 ml    Route: [place]    Given by: Zella Ball Ewing CMA (AAMA)    Exp. Date: 12/24/2010    Lot #: [id number]    VIS given: 02/17/05 given May 06, 2010.

## 2010-06-29 ENCOUNTER — Ambulatory Visit (INDEPENDENT_AMBULATORY_CARE_PROVIDER_SITE_OTHER): Payer: Self-pay | Admitting: Internal Medicine

## 2010-06-29 ENCOUNTER — Encounter: Payer: Self-pay | Admitting: Internal Medicine

## 2010-06-29 ENCOUNTER — Other Ambulatory Visit: Payer: Self-pay | Admitting: Internal Medicine

## 2010-06-29 DIAGNOSIS — N400 Enlarged prostate without lower urinary tract symptoms: Secondary | ICD-10-CM

## 2010-06-29 DIAGNOSIS — R351 Nocturia: Secondary | ICD-10-CM | POA: Insufficient documentation

## 2010-06-29 DIAGNOSIS — I10 Essential (primary) hypertension: Secondary | ICD-10-CM

## 2010-06-29 LAB — URINALYSIS, ROUTINE W REFLEX MICROSCOPIC
Bilirubin Urine: NEGATIVE
Leukocytes, UA: NEGATIVE
Nitrite: NEGATIVE
Total Protein, Urine: NEGATIVE

## 2010-07-05 NOTE — Assessment & Plan Note (Signed)
Summary: prostate exam/lb   Vital Signs:  Patient profile:   60 year old male Height:      67 inches Weight:      202.38 pounds BMI:     31.81 O2 Sat:      98 % on Room air Temp:     98.4 degrees F oral Pulse rate:   66 / minute BP sitting:   118 / 68  (left arm) Cuff size:   large  Vitals Entered By: Zella Ball Ewing CMA (AAMA) (June 29, 2010 1:17 PM)  O2 Flow:  Room air CC: Recheck prostate/RE   CC:  Recheck prostate/RE.  History of Present Illness: her to f/u - overall doing ok but although he is drinking more fluids overall he is having increased urinary freq during the day and night - having approx 4- times nocturia/.  No dysuria but has urgency, but no blood, back pain, f/c or n/v.  No prior of tx for prostate or DM or UTI or renal stones.  Starting to urine no problem, flow ok, and no sensation of retention.    Pt denies CP, worsening sob, doe, wheezing, orthopnea, pnd, worsening LE edema, palps, dizziness or syncope  Pt denies new neuro symptoms such as headache, facial or extremity weakness . Pt denies polydipsia, polyuria  Overall good compliance with meds, trying to follow low chol  diet, wt stable, little excercise however Denies worsening depressive symptoms, suicidal ideation, or panic.   Overall good compliance with meds, and good tolerability.    Problems Prior to Update: 1)  Back Pain  (ICD-724.5) 2)  Benign Prostatic Hypertrophy  (ICD-600.00) 3)  Dyspnea  (ICD-786.05) 4)  Abdominal Pain, Upper  (ICD-789.09) 5)  Wheezing  (ICD-786.07) 6)  Uri  (ICD-465.9) 7)  Frequency, Urinary  (ICD-788.41) 8)  Skin Lesion  (ICD-709.9) 9)  Preventive Health Care  (ICD-V70.0) 10)  Hepatotoxicity, Drug-induced, Risk of  (ICD-V58.69) 11)  Abdominal Pain, Epigastric  (ICD-789.06) 12)  Common Migraine  (ICD-346.10) 13)  Gerd  (ICD-530.81) 14)  Preventive Health Care  (ICD-V70.0) 15)  Overactive Bladder  (ICD-596.51) 16)  Family History Diabetes 1st Degree Relative   (ICD-V18.0) 17)  Prostatitis, Acute  (ICD-601.0) 18)  Hypertension  (ICD-401.9) 19)  Hyperlipidemia  (ICD-272.4)  Medications Prior to Update: 1)  Ecotrin Low Strength 81 Mg  Tbec (Aspirin) .Marland Kitchen.. 1 By Mouth Qd 2)  Amlodipine Besylate 10 Mg Tabs (Amlodipine Besylate) .Marland Kitchen.. 1 By Mouth Once Daily 3)  Crestor 40 Mg Tabs (Rosuvastatin Calcium) .Marland Kitchen.. 1  By Mouth Once Daily 4)  Miralax  Powd (Polyethylene Glycol 3350) .... Use Asd 5)  Benazepril Hcl 20 Mg Tabs (Benazepril Hcl) .Marland Kitchen.. 1po  Once Daily 6)  Diclofenac Sodium 75 Mg Tbec (Diclofenac Sodium) .Marland Kitchen.. 1 By Mouth Two Times A Day As Needed For Pain  Current Medications (verified): 1)  Ecotrin Low Strength 81 Mg  Tbec (Aspirin) .Marland Kitchen.. 1 By Mouth Qd 2)  Amlodipine Besylate 10 Mg Tabs (Amlodipine Besylate) .Marland Kitchen.. 1 By Mouth Once Daily 3)  Crestor 40 Mg Tabs (Rosuvastatin Calcium) .Marland Kitchen.. 1  By Mouth Once Daily 4)  Miralax  Powd (Polyethylene Glycol 3350) .... Use Asd 5)  Benazepril Hcl 20 Mg Tabs (Benazepril Hcl) .Marland Kitchen.. 1po  Once Daily 6)  Diclofenac Sodium 75 Mg Tbec (Diclofenac Sodium) .Marland Kitchen.. 1 By Mouth Two Times A Day As Needed For Pain 7)  Vesicare 5 Mg Tabs (Solifenacin Succinate) .Marland Kitchen.. 1po Once Daily  Allergies (verified): 1)  ! 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   Head:  normocephalic and atraumatic.   Eyes:  vision grossly intact, pupils equal, and pupils round.   Ears:  R ear normal and L ear normal.   Nose:  no  external deformity and no nasal discharge.   Mouth:  no gingival abnormalities and pharynx pink and moist.   Neck:  supple and no masses.   Lungs:  normal respiratory effort and normal breath sounds.   Heart:  normal rate and regular rhythm.   Abdomen:  soft, non-tender, normal bowel sounds, no masses, no guarding, and no rigidity.   Rectal:  DRE heme neg, with normal prostate size, nontender no nodule Msk:  no flank pain Extremities:  no edema, no erythema    Impression & Recommendations:  Problem # 1:  NOCTURIA (EAV-409.81)  with daytime freq and urgency as well, worse in the past few months; exam o/w benign, no hx of DM and CBG in the office today stable - 120;  wil check repeat Urine studies, but suspect overall OAB problem ;  gave vesicare 10 mg -1/2 once daily samples, and rx for vesicare as well; to urology if not improved  Orders: TLB-Udip w/ Micro (81001-URINE) T-Culture, Urine ([id number])  Problem # 2:  BENIGN PROSTATIC HYPERTROPHY (ICD-600.00) mild, consider flomax, but o/w minimal prostatism at best it seems  Problem # 3:  HYPERTENSION (ICD-401.9)  His updated medication list for this problem includes:    Amlodipine Besylate 10 Mg Tabs (Amlodipine besylate) .Marland Kitchen... 1 by mouth once daily    Benazepril Hcl 20 Mg Tabs (Benazepril hcl) .Marland Kitchen... 1po  once daily  BP today: 118/68 Prior BP: 142/90 (05/06/2010)  Labs Reviewed: K+: 4.3 (05/03/2010) Creat: : 1.0 (05/03/2010)   Chol: 246 (05/03/2010)   HDL: 66.70 (05/03/2010)   LDL: 78 (04/29/2009)   TG: 85.0 (05/03/2010) stable overall by hx and exam, ok to continue meds/tx as is   Complete Medication List: 1)  Ecotrin Low Strength 81 Mg Tbec (Aspirin) .Marland Kitchen.. 1 by mouth qd 2)  Amlodipine Besylate 10 Mg Tabs (Amlodipine besylate) .Marland Kitchen.. 1 by mouth once daily 3)  Crestor 40 Mg Tabs (Rosuvastatin calcium) .Marland Kitchen.. 1  by mouth once daily 4)  Miralax Powd (Polyethylene glycol 3350) .... Use asd 5)  Benazepril Hcl 20 Mg Tabs (Benazepril  hcl) .Marland Kitchen.. 1po  once daily 6)  Diclofenac Sodium 75 Mg Tbec (Diclofenac sodium) .Marland Kitchen.. 1 by mouth two times a day as needed for pain 7)  Vesicare 5 Mg Tabs (Solifenacin succinate) .Marland Kitchen.. 1po once daily  Patient Instructions: 1)  Your blood sugar today was 120 (OK) 2)  Please go to the Lab in the basement for your urine tests today  3)  The prostate was [facility] today 4)  Please take all new medications as prescribed  - the vesicare sample 10 mg at 1/2 once daily , then the prescription (remember the medicaiton takes about 3-4 wks to take full effect) 5)  If not improved, please call after 4 wks for referral to urology 6)  Continue all previous medications as before this visit  7)  Please schedule a follow-up appointment as needed. Prescriptions: VESICARE 5 MG TABS (SOLIFENACIN SUCCINATE) 1po once daily  #90 x 3   Entered and Authorized by:   Corwin Levins MD   Signed by:   Corwin Levins MD on 06/29/2010   Method used:   Print then Give to Patient   RxID:   [id number]    Orders Added: 1)  TLB-Udip w/ Micro [81001-URINE] 2)  T-Culture, Urine [[id number]] 3)  Est. Patient Level IV [30865]  Appended Document: Lab Order     Lab Visit  Laboratory Results   Blood Tests     CBG Random:: 120mg /dL    Orders Today:

## 2010-09-20 NOTE — Cardiovascular Report (Signed)
[name] [name], [name] [name]               ACCOUNT NO.:  [id number]   MEDICAL RECORD NO.:  [id number]          PATIENT TYPE:  INP   LOCATION:  3702                         FACILITY:  [facility]   PHYSICIAN:  Veverly Fells. Excell Seltzer, MD  DATE OF BIRTH:  [birth year]   DATE OF PROCEDURE:  06/26/2007  DATE OF DISCHARGE:  06/26/2007                            CARDIAC CATHETERIZATION   PROCEDURE:  Left heart catheterization, selective coronary angiography,  left ventricular angiography, StarClose of the right femoral artery.   INDICATIONS:  [name] [name] is a 60 year old gentleman who presented with  substernal chest pain.  He had an abnormal EKG with ST changes and T-  wave abnormality suggestive of ischemia.  He ruled out for a myocardial  infarction.  He was referred for cardiac catheterization.   DESCRIPTION OF PROCEDURE:  The risks and indications of the procedure  were reviewed with the patient and consent was obtained.  The right  groin was prepped, draped, and anesthetized with 1% lidocaine.  Using a  modified Seldinger technique, a 6-French sheath was placed in the right  femoral artery.  Standard 6-French catheters were used for selective  coronary angiography and left ventriculography.  The patient tolerated  the procedure well and had no immediate complications.  At the  completion of the procedure, a StarClose device was used to seal the  femoral arteriotomy.   FINDINGS:  1. Aortic pressure 124/73 with a mean of 96.  2. Left ventricular pressure 127/19.   CORONARY ANGIOGRAPHY:  1. Left mainstem:  Left mainstem is angiographically normal.  It      bifurcates into the LAD and left circumflex.  2. LAD:  The LAD is a large caliber vessel that courses down and wraps      around the LV apex.  The LAD supplies a moderate size first      diagonal branch.  At the first septal perforator in the mid portion      of the LAD, there is a smooth 30% stenosis.  The remaining portions      of the mid and  distal LAD have no significant angiographic disease.  3. Left circumflex:  The left circumflex is dominant.  It courses down      and supplies a large first OM branch as well as a left PDA branch      and left posterolateral branch.  There is no significant      angiographic stenosis throughout the left circumflex system.  4. Right coronary artery:  The right coronary artery is small and      nondominant.  There is a 50% ostial stenosis that I think is      related to vasospasm induced by the catheter.  There is no other      significant stenosis throughout.   LEFT VENTRICULOGRAPHY:  Left ventriculography demonstrates low normal LV  function with a LVEF of 55%.  There is no significant mitral  regurgitation.   ASSESSMENT:  1. Nonobstructive coronary artery disease.  2. Normal left ventricular function.   PLAN:  Recommend medical therapy with aspirin and a statin.  The patient  can follow up with Dr. Jonny Ruiz.  He does not require cardiology follow up  unless he develops any new cardiac problems.      Veverly Fells. Excell Seltzer, MD  Electronically Signed     MDC/MEDQ  D:  06/26/2007  T:  06/27/2007  Job:  [id number]   cc:   Corwin Levins, MD

## 2010-09-20 NOTE — Assessment & Plan Note (Signed)
[facility]                            CARDIOLOGY OFFICE NOTE   NAME:[name] [name]                      MRN:          [id number]  DATE:08/22/2007                            DOB:          29-May-1950    HISTORY:  [name] [name] presents today for evaluation of right groin  pain.  [name] [name] underwent a diagnostic heart catheterization back on  June 26, 2007.  He presented with substernal chest pain and was  referred for cardiac cath.  His catheterization was performed through a  6-French sheath in the right femoral artery.  At the completion of the  procedure, a Star Close device was used for arteriotomy closure.  He  complains of intermittent pain at the catheterization site ever since  his procedure.  He has had no numbness or tingling down the leg.  He has  noticed a small lump at the site of skin entry, but there has been no  bruising or other complaints.   [name] [name] has seen Dr. Jonny Ruiz in followup.  With his continued  complaints, he was referred here for examination.   Focused exam of the right leg shows the right groin site to be well  healed.  There is a very small area of superficial scar tissue at the  entry site.  There is no hematoma or mass.  There is no bruit over the  site.  The site is nontender.  Exam of the right leg shows no edema.  Peripheral pulses are 2+ and equal.   DIAGNOSTICS:  Arterial and venous duplex of the right groin shows no  evidence of obstruction, pseudoaneurysm or AV fistula formation.   ASSESSMENT:  This is a 60 year old gentleman with nonobstructive  coronary artery disease who has some post-cardiac catheterization right  groin pain.  I have reassured [name] [name] today that everything looked  okay both by physical exam and with ultrasound.  He does not require any  further evaluation or therapy at this point.  I would be happy to see  him back on an as-needed basis in the future.  Continued medical care  per  Dr. Jonny Ruiz.     [name] [name]. [name] Seltzer, MD  Electronically Signed    MDC/MedQ  DD: 08/22/2007  DT: 08/22/2007  Job #: [id number]   cc:   Corwin Levins, MD

## 2010-09-20 NOTE — H&P (Signed)
[name] [name], [name] [name]               ACCOUNT NO.:  [id number]   MEDICAL RECORD NO.:  [id number]          PATIENT TYPE:  INP   LOCATION:  1845                         FACILITY:  [facility]   PHYSICIAN:  Veverly Fells. Excell Seltzer, MD  DATE OF BIRTH:  1950-08-12   DATE OF ADMISSION:  06/24/2007  DATE OF DISCHARGE:                              HISTORY & PHYSICAL   TIME OF ADMISSION:  1420 hours.   PRIMARY CARE PHYSICIAN:  Dr. Corwin Levins.   CHIEF COMPLAINT:  Chest pressure.   HISTORY OF PRESENT ILLNESS:  This 60 year old male with a history of  hypertension and hyperlipidemia with four days of central substernal  chest pressure 7/10.  The chest pressure may have begun after eating.  He is not sure, but it has persisted since Friday, with no alleviating  factor.  Nothing makes the pain better.  Nothing makes the pain worse.  He notes that since the chest pressure has begun, he gets full faster.  He describes the pressure as a 7/10, decreased to 3/10 after receiving  sublingual nitroglycerin in the emergency room.  He notes that there is  no radiation of the chest pressure.  There is no lightheadedness.  He  has no confusion.  There is no shortness of breath, no complications, no  additional symptoms, just the chest pressure.   ALLERGIES:  CELEBREX causes neurological symptoms.   CURRENT MEDICATIONS:  1. Lotrel 10/20 mg once daily.  2. Crestor 5 mg once daily.  3. He said he was recently started on a fluid pill, but does not know      which one and did not recognize the name of Lasix or      hydrochlorothiazide.  4. He takes fish oil and Omega-3 once daily.  5. Aspirin 81 mg once daily.   PAST MEDICAL HISTORY:  1. Hypertension.  2. Hyperlipidemia.  He has no history of cardiac catheterizations or coronary artery bypass  graft surgery.  No prior cardiac workup.   SOCIAL/FAMILY HISTORY:  He lives in [place] with his wife.  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  He sometimes works as a pressure washer.  He has three  children, all healthy.  He has multiple healthy siblings.  His mother  died in her 22's of heart disease.  Father died of lung cancer.  He has  no history of smoking.  No alcohol use.  No drug use.  No herbal  medication use.  He says he has a fairly decent diet.  Does some mild  exercise without any problems.   REVIEW OF SYSTEMS:  He had a cough that was productive of yellow sputum,  but is now dry.  This has lasted for one week.  The review of systems is  otherwise negative.  He is a full code.   PHYSICAL EXAMINATION:  VITAL SIGNS:  Temperature 97 degrees, pulse 71,  respirations 18, blood pressure 108/62 on admission, O2 saturation 98%  on room air.  GENERAL:  In no apparent distress.  HEENT:  Normal.  NECK:  Supple, no jugular venous distention, no carotid bruits, no  thyromegaly.  He had one small left submandibular lymph node.  No  carotid bruits.  CARDIOVASCULAR:  S1 and S2, no murmur.  Regular rate and rhythm.  LUNGS:  He had coarse bilateral lung sounds.  Good air entry.  Able to  speak in full sentences without difficulty. No pain on deep inspiration.  No pain on leaning forward.  BACK:  He had a small boil in the midline of his back.  He says this has  been there for some time.  ABDOMEN:  Soft, nontender.  Normal bowel sounds.  No rebound or  guarding.  EXTREMITIES:  He had no peripheral edema.  NEUROLOGIC:  Alert and oriented x3.  Cranial nerves II-XII  intact.   Chest x-ray showed chronic bronchial changes but nothing acute.   Electrocardiogram:  Rate of 68, sinus, normal axis.  Marland Kitchen  He had no  hypertrophy.  He had ST depression in V4, V5 and V6, II, III and aVF  with the lateral ST depression being new, in addition to the inferior  depression, though he did have some questionable ST depression in the  prior electrocardiogram in 2005, and lead II was not consistent  throughout his  inferior leads.   LABORATORY DATA:  Sodium 138, potassium 3.9, chloride 107, bicarb 26,  BUN 11, glucose 100, creatinine pending.  White blood cell count 11.8  with a left shift of 8.9, hemoglobin 14.2, hematocrit 42.7, platelets  264.  D-dimer less than 0.22.  MB 1.2, troponin I less than 0.05.  In  October 2008, his HDL was 59, LDL 164.   ASSESSMENT/PLAN:  A 59 year old male with hypertension, hyperlipidemia,  chest pressure and ST depressions in multiple leads:  Based on these  findings, despite his negative cardiac markers, will plan on performing  a cardiac catheterization tomorrow while we risk stratify him, start him  on aspirin, heparin, morphine p.r.n., beta blocker, sublingual  nitroglycerin p.r.n. pain.  Watch him on telemetry and n.p.o. after  midnight.  This was discussed with the patient and his wife, and they  are agreeable.      Valetta Close, M.D.  Electronically Signed      Veverly Fells. Excell Seltzer, MD  Electronically Signed    JC/MEDQ  D:  06/24/2007  T:  06/24/2007  Job:  [id number]

## 2010-09-20 NOTE — Discharge Summary (Signed)
[name], [name]               ACCOUNT NO.:  [id number]   MEDICAL RECORD NO.:  [id number]          PATIENT TYPE:  INP   LOCATION:  3702                         FACILITY:  [facility]   PHYSICIAN:  Veverly Fells. Excell Seltzer, MD  DATE OF BIRTH:  09-25-1950   DATE OF ADMISSION:  06/24/2007  DATE OF DISCHARGE:  06/25/2007                               DISCHARGE SUMMARY   PRIMARY CARE [name] [name]:  [name] Levins, MD.   PRIMARY CARDIOLOGIST:  Veverly Fells. Excell Seltzer, MD.   DISCHARGE DIAGNOSIS:  Chest pain.   SECONDARY DIAGNOSES:  1. Hypertension.  2. Hyperlipidemia.   ALLERGIES:  CELEBREX.   PROCEDURE:  Left heart cardiac catheterization.   HISTORY OF PRESENT ILLNESS:  A 60 year old male without prior history of  coronary artery disease who presented to the [facility] Emergency  Department on June 24, 2007, with a four day history of intermittent  chest discomfort and pressure, generally developing after meals.  In the  ED, he was noted to have marked inferolateral T-wave abnormalities  suggestive of ischemia and he was admitted for further evaluation.   HOSPITAL COURSE:  [name] ruled out for MI, and has underwent left  heart cardiac catheterization this morning, June 26, 2007, revealing  nonobstructive coronary artery disease.  EF was 55%.  We have  recommended continued medical therapy with aspirin and Statin therapy as  well as blood pressure control (patient previously on Lotrel).  He will  be discharged home today in good condition.   DISCHARGE LABORATORY DATA:  Hemoglobin 13.1, hematocrit 38.5, wbc 8.9,  platelets 305.  Sodium 140, potassium 3.4, chloride 106, CO2 28, BUN 9,  creatinine 1.03, glucose 95.  Total bilirubin 1, alkaline phosphatase  62, AST 23, ALT 22, total protein 7.6, albumin 3.8, calcium 9.1.  Hemoglobin A1c 5.9.  Total CK 141, MB 1.1, troponin I 0.01.  Total  cholesterol 220, triglycerides 70, HDL 63, LDL 143.  TSH 3.058.   DISPOSITION:  Patient is being  discharged home today in good condition.   FOLLOW UP PLANS AND APPOINTMENTS:  Follow-up with Dr. Jonny [name] in 2-3 weeks.   DISCHARGE MEDICATIONS:  1. Crestor 5 mg daily.  2. Lotrel 10/20 mg daily.  3. Aspirin 81 mg daily.   OUTSTANDING LABORATORY STUDIES:  None.   DURATION OF DISCHARGE ENCOUNTER:  40 minutes including physician time.      Nicolasa Ducking, ANP      Veverly Fells. Excell Seltzer, MD  Electronically Signed    CB/MEDQ  D:  06/26/2007  T:  06/26/2007  Job:  [id number]   cc:   [name] Levins, MD

## 2010-09-23 NOTE — Consult Note (Signed)
NAMEMARSHAUN, LORTIE               ACCOUNT NO.:  [id number]   MEDICAL RECORD NO.:  [id number]          PATIENT TYPE:  EMS   LOCATION:  MAJO                         FACILITY:  [facility]   PHYSICIAN:  Marlan Palau, M.D.  DATE OF BIRTH:  March 27, 1951   DATE OF CONSULTATION:  12/02/2005  DATE OF DISCHARGE:  12/02/2005                                   CONSULTATION   HISTORY OF PRESENT ILLNESS:  [name] [name] is a 60 year old right-handed,  black gentleman born 19-Jul-1950, with a history of hypertension  followed by Dr. Ladell Pier.  This patient comes to the [facility]  emergency room via [facility] for an evaluation of onset of tongue  weakness that began within the last 24 hours prior to this evaluation.  The  patient noted that he had a long standing history of headaches dating back  greater than 7 years.  The patient began having a headache 3 days prior to  this evaluation.  The headache however, did not go away, persisted in to the  day prior to this admission.  The patient also notes some left shoulder pain  and neck pain.  The patient was seen through [facility] today after  he had taken some Celebrex for his neck pain on Friday and noted some  numbness in the mouth and prompted turning of the tongue to the left.  The  patient claims that he did not have problems swallowing, did not have  alteration in speech.  The patient felt that left face possibly was a little  bit numb.  The patient denied any weakness or numbness on the arms or legs.  Denies any gait disturbance, problems with vision, double vision, altered  vision.  It appeared headache is improved at this point and that tongue  weakness is improving, but not resolved.  The patient seen by Dr. Andee Poles today  at [facility].  Was sent to the emergency room for an MRI study.  The study was done today.  It reveals no medullary abnormalities.  There  appears to be a chronic periventricular lesion,  possibly a small vessel  infarct in the left brain.  This is in the parietal white matter.  The  patient however, has no acute lesions seen.  Neurology was then asked to see  this patient for further evaluation.  The patient was not on aspirin on a  regular basis prior to coming in.   PAST MEDICAL HISTORY:  1.  History of hypertension.  2.  Left tongue weakness as above.  3.  Hypercholesterolemia, not on medications.   PAST SURGICAL HISTORY:  Otherwise no surgical history.  The patient has  otherwise been in good health.  Does not smoke or drink.   ALLERGIES:  HAS NO KNOWN ALLERGIES.   CURRENT MEDICATIONS:  Lotrel once daily.   SOCIAL HISTORY:  The patient is married.  Has 3 children who are all alive  and well.  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   FAMILY HISTORY:  Noted that mother died with heart disease.  Father died of  lung cancer.  The patient has 3 brothers and 4 sisters who are alive and  well.   REVIEW OF SYSTEMS:  Noted for no for no recent fevers, chills.  The patient  denies any dizziness, blackout episodes.  Denies any problems with chest  pain, shortness of breath, nausea, vomiting, abdominal pain, trouble  controlling bowels or bladder, gait disturbance, blackout episodes.   PHYSICAL EXAMINATION:  VITAL SIGNS:  Blood pressure is 163/95, heart rate  59, respiratory rate 22.  He is afebrile.  GENERAL:  This patient is a fairly well-developed black male who is alert  and cooperative at the time of examination.  HEENT EXAMINATION:  Head is atraumatic.  Eyes:  Pupils equal, round and  reactive to light.  Disks soft and flat bilaterally.  NECK:  Supple. No carotid bruits noted.  RESPIRATORY EXAMINATION:  Clear.  CARDIOVASCULAR EXAMINATION:  Reveals a regular rate and rhythm.  No obvious  murmurs or rubs noted.  EXTREMITIES:  Without significant edema.  NEUROLOGIC EXAMINATION:  Cranial nerves as above.  Facial  asymmetry is  present.  The patient has some decreased pinprick sensation, the left  forehead is greater than the right, otherwise symmetric sensation in the  lower face.  The patient has deviation of the tongue fibula with protrusion.  The patient has normal speech pattern.  No dysarthria or aphasia.  Again,  the strength of the face muscles are symmetric and normal.  Motor sensory  was 5/5 in all 4.  Good symmetric motor tones.  Sensory testing was intact  to pinprick, soft touch,  senses intact in all 4.  The patient has good  finger to nose to finger, heel to shin.  Gait normal.  Tandem gait normal.  Romberg negative.  No evidence of pronator drift seen.  Deep tendon reflexes  symmetric and normal.  Toes downgoing bilaterally.  Again, gait is normal  and tandem gait normal.  No drift seen.   Laboratory values are pending at this time, drawn by Dr. Andee Poles.  MRI study is  as above.   IMPRESSION:  1.  History of headache.  2.  New onset of left tongue weakness, etiology unclear.   This patient has tongue numbness.  It is of unclear cause.  The patient has  clear protrusion of the tongue to the left suggesting left-sided tongue  weakness.  This could represent minor neuropathy affecting the 12th cranial  nerve on the left.  I suppose need to consider to rule out the possibility  of basilar process such as sarcoidosis, tuberculosis, etc,...  The patient  has no other focal findings however.  Even though the MRI scan of the brain  was unremarkable, a small medullary infarct could be missed.  Particularly  in light of the left shoulder and neck pain, we will need to consider rule  out the possibility of vertebral artery dissection.  The patient; however,  has not sustained an acute infarct that can be picked up by MRI study.   PLAN:  1.  The patient will go on aspirin.  2.  Outpatient MRI angiogram of the intracranial neck and intracranial      vessels. 3.  The patient will discharged  to home today.  Follow up with [facility].      Marlan Palau, M.D.  Electronically Signed     CKW/MEDQ  D:  12/02/2005  T:  12/03/2005  Job:  [id number]   cc:   Ladell Pier, M.D.

## 2010-09-23 NOTE — Op Note (Signed)
   [name] [name], [name] [name]                         ACCOUNT NO.:  [id number]   MEDICAL RECORD NO.:  [id number]                   PATIENT TYPE:  AMB   LOCATION:  ENDO                                 FACILITY:  [facility]   PHYSICIAN:  Danise Edge, M.D.                DATE OF BIRTH:  03-Jan-1951   DATE OF PROCEDURE:  09/10/2001  DATE OF DISCHARGE:                                 OPERATIVE REPORT   PROCEDURE PERFORMED:  Screening colonoscopy.   ENDOSCOPIST:  Charolett Bumpers, M.D.   INDICATIONS FOR PROCEDURE:  The patient is a 60 year old male born [birth year].  He is scheduled to undergo his first screening colonoscopy with  polypectomy to prevent colon cancer.   PREMEDICATION:  Versed 5 mg, Demerol 50 mg.   DESCRIPTION OF PROCEDURE:  After obtaining informed consent, the patient was  placed in the left lateral decubitus position.  I administered intravenous  Demerol and intravenous Versed to achieve conscious sedation for the  procedure.  The patient's blood pressure, oxygen saturations and cardiac  rhythm were monitored throughout the procedure and documented in the medical  record.   Anal inspection was normal.  Digital rectal exam revealed a nonnodular  prostate.  The pediatric Olympus video colonoscope was introduced into the  rectum and easily advanced to the cecum.  Colonic preparation for the exam  today was excellent.   Rectum:  Normal.   Sigmoid colon and descending colon:  Left colonic diverticulosis without  diverticulitis or diverticular stricture formation.   Splenic flexure:  Normal.   Transverse colon:  Normal.   Hepatic flexure:  Normal.   Ascending colon:  Normal.   Cecum and ileocecal valve:  Normal.    ASSESSMENT:  Normal screening proctocolonoscopy to the cecum.  No endoscopic  evidence for the presence of colorectal neoplasia.  Left colonic  diverticulosis noted.                                                    Danise Edge,  M.D.    MJ/MEDQ  D:  09/11/2002  T:  09/11/2002  Job:  [id number]   cc:   Ike Bene, M.D.  [address]  [place]  [place] [address]  Fax: [phone]

## 2011-01-03 IMAGING — US US ABDOMEN COMPLETE
1 series · 14 of 25 positions shown · non-contrast
Comparison: None.

CLINICAL DATA: Upper abdominal pain

COMPLETE ABDOMINAL ULTRASOUND

[Series 1: us abdomen complete · 0.26mm/px · 14 of 81 slices shown]
[im 1/81]
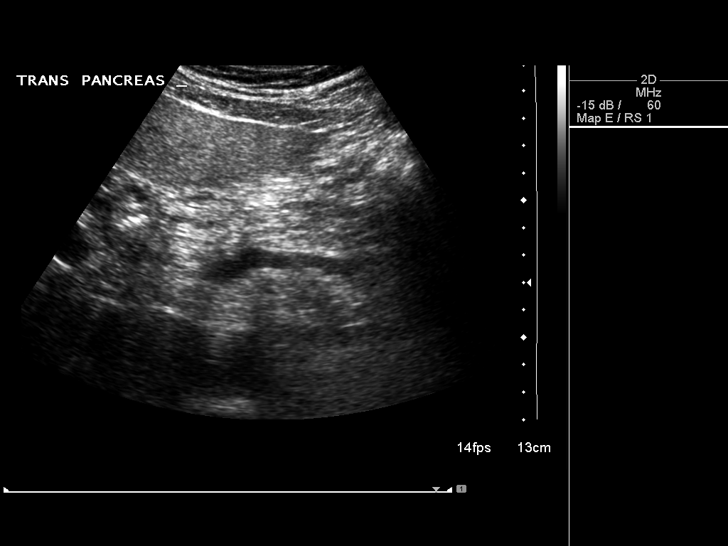
[im 7/81]
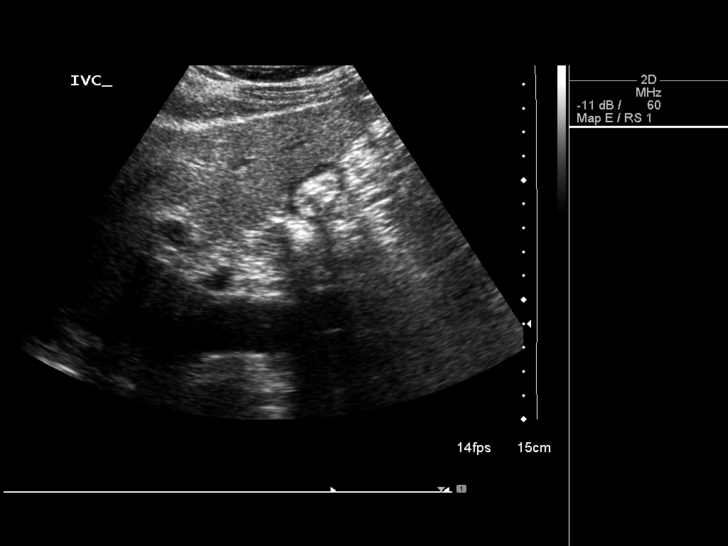
[im 14/81]
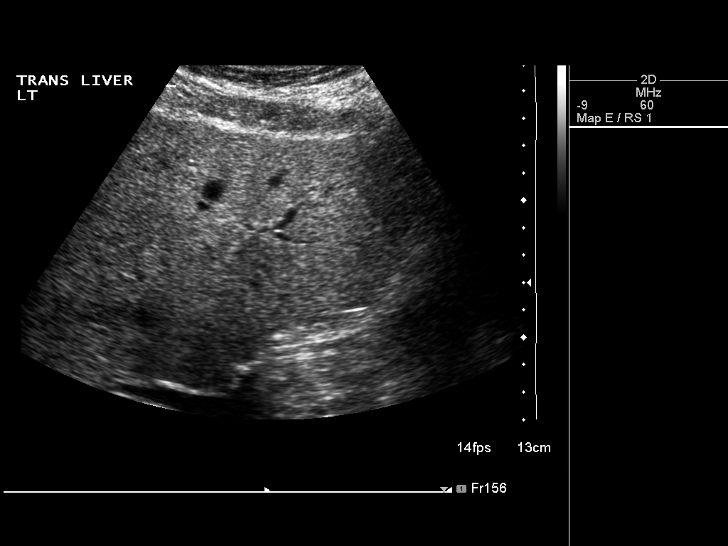
[im 21/81]
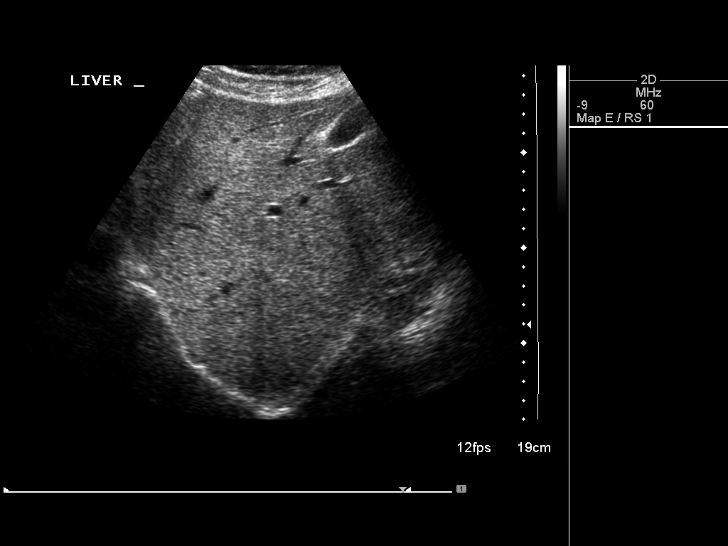
[im 27/81]
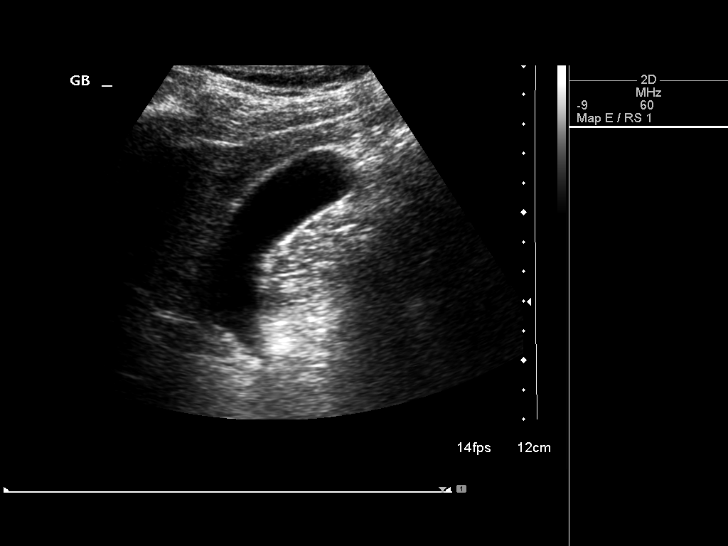
[im 31/81]
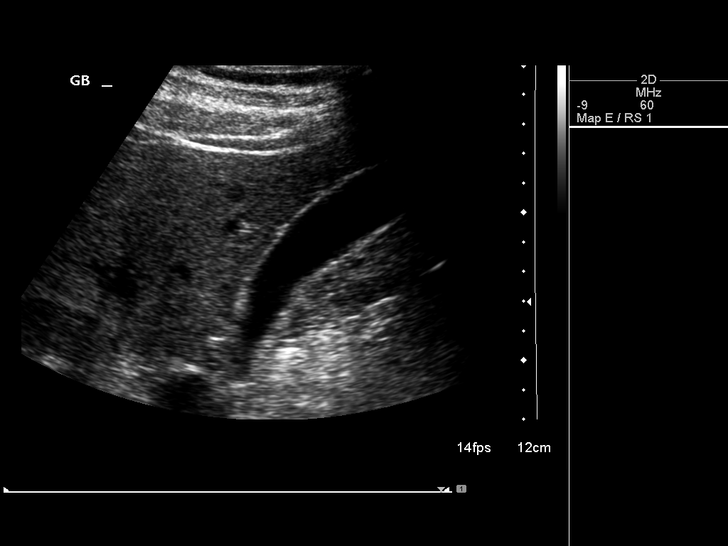
[im 37/81]
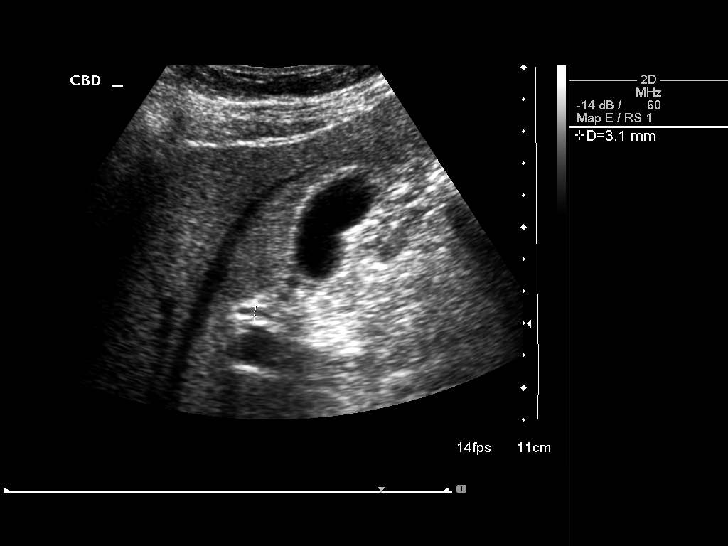
[im 44/81]
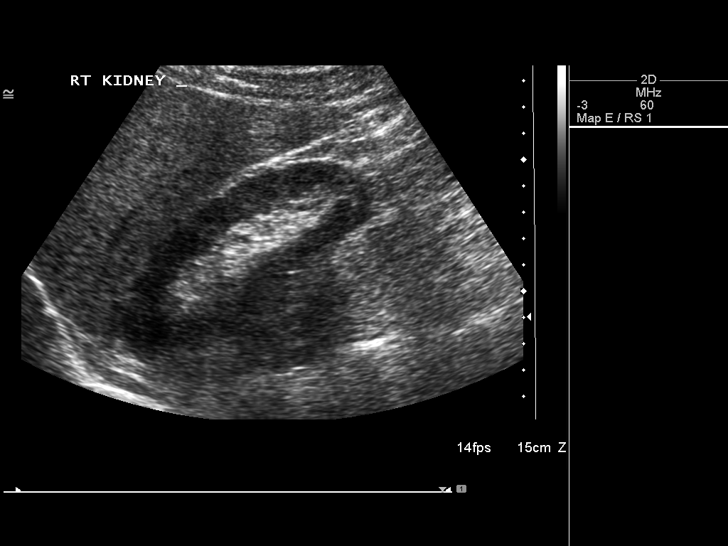
[im 51/81]
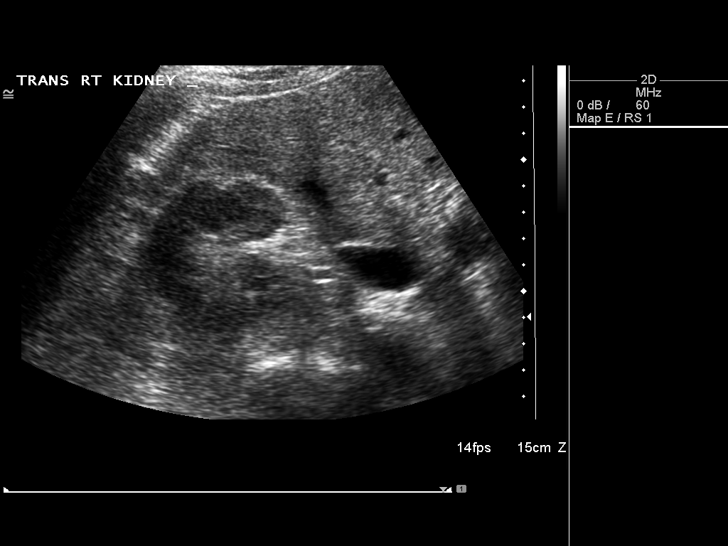
[im 54/81]
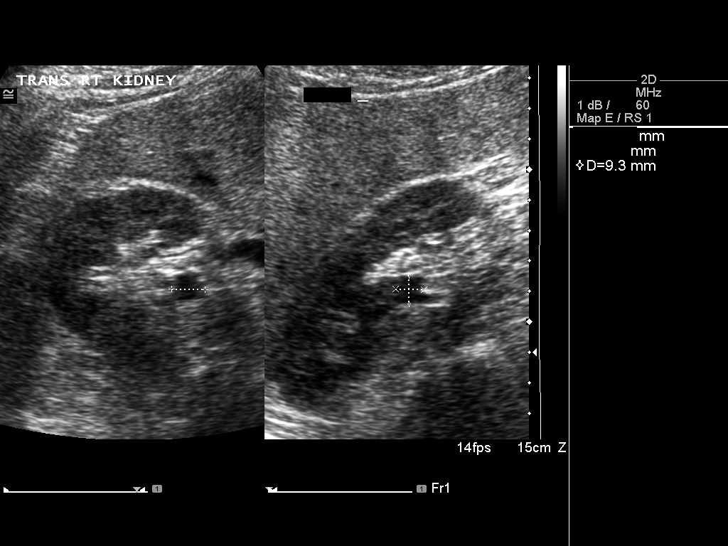
[im 61/81]
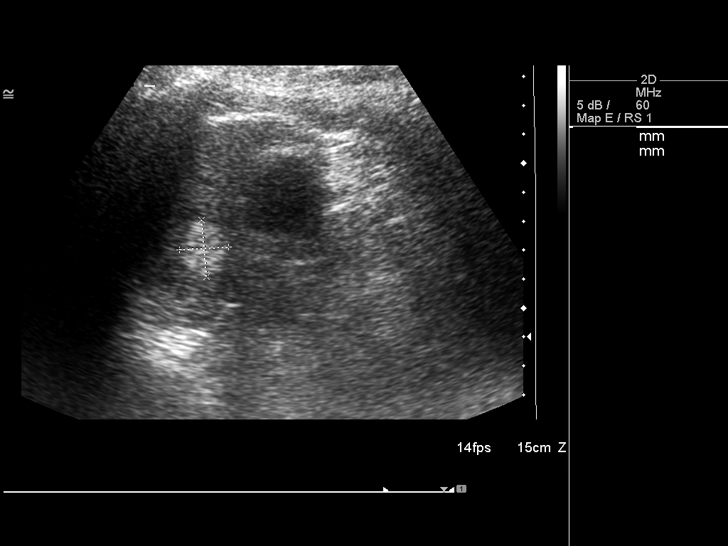
[im 67/81]
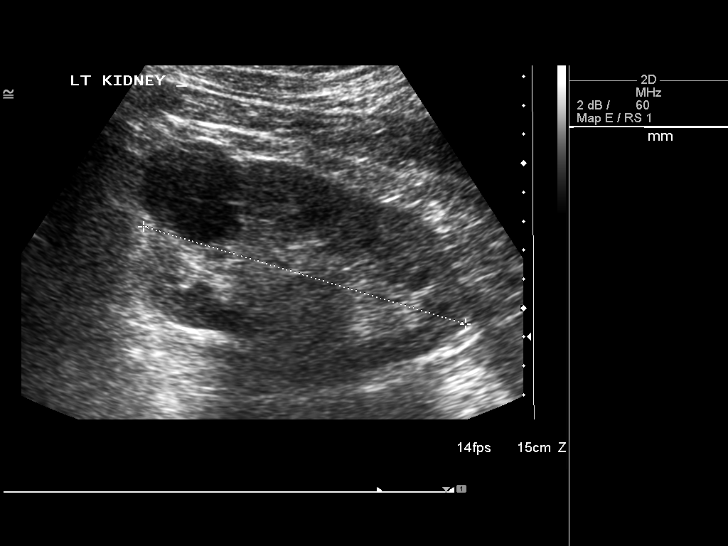
[im 74/81]
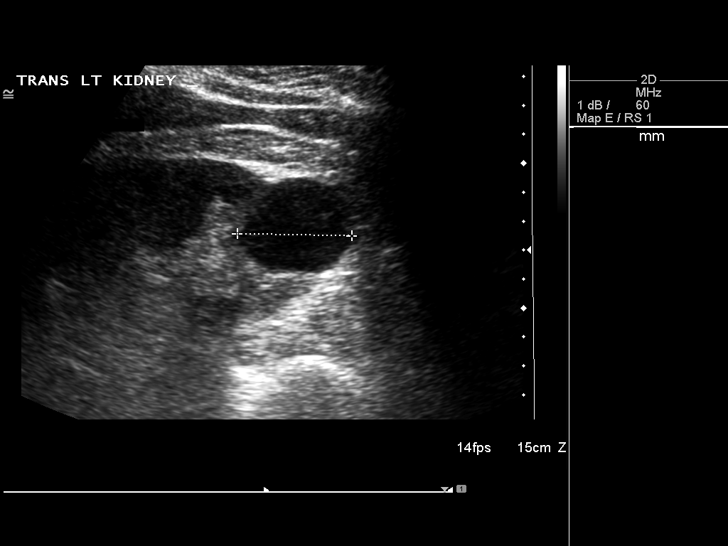
[im 81/81]
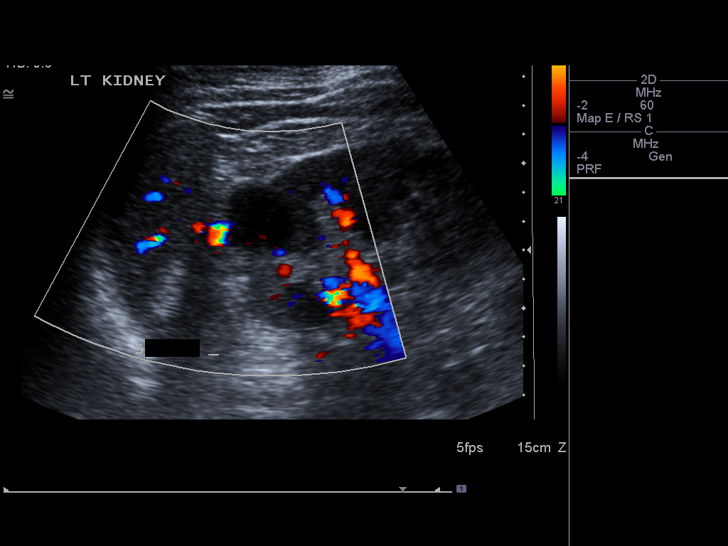

[14 of 25 positions shown; findings below may reference images not displayed]

FINDINGS: Gallbladder:  No gallstones, gallbladder wall thickening, or
pericholecystic fluid.

Common bile duct:   Within normal limits in caliber.

Liver:  No focal lesion identified.  Within normal limits in
parenchymal echogenicity.

IVC:  Appears normal.

Pancreas:  Limited visualization due to overlying bowel gas.
Visualized portions are unremarkable.

Spleen:  2.1 cm hyperechoic lesion within the spleen, likely
hemangioma.

Right Kidney:   1.1 cm medial mid pole cyst.  No hydronephrosis.

Left Kidney:  4.2 cm upper pole cyst which appears simple.  9 mm
mid pole cyst.  No hydronephrosis.

Abdominal aorta:  Mild ectasia, 2.9 cm.
IMPRESSION: Bilateral renal cysts.

Probable hemangioma in the spleen.

No acute findings.

## 2011-01-27 LAB — COMPREHENSIVE METABOLIC PANEL
ALT: 22
CO2: 29
Calcium: 9
Creatinine, Ser: 0.95
GFR calc non Af Amer: >60
Glucose, Bld: 104 — ABNORMAL HIGH
Sodium: 140
Total Bilirubin: 1

## 2011-01-27 LAB — DIFFERENTIAL
Eosinophils Absolute: 0.1
Eosinophils Relative: 1
Lymphs Abs: 2
Monocytes Relative: 6

## 2011-01-27 LAB — CARDIAC PANEL(CRET KIN+CKTOT+MB+TROPI)
CK, MB: 1.1
CK, MB: 1.3
Relative Index: 0.8
Relative Index: 0.9
Total CK: 139
Total CK: 141
Troponin I: 0.02

## 2011-01-27 LAB — HEMOGLOBIN A1C: Hgb A1c MFr Bld: 5.9

## 2011-01-27 LAB — HEPARIN LEVEL (UNFRACTIONATED)
Heparin Unfractionated: 0.39
Heparin Unfractionated: 0.57
Heparin Unfractionated: 0.92 — ABNORMAL HIGH

## 2011-01-27 LAB — BASIC METABOLIC PANEL
Calcium: 9.1
Creatinine, Ser: 1.03
GFR calc Af Amer: >60
GFR calc non Af Amer: >60
Sodium: 140

## 2011-01-27 LAB — I-STAT 8, (EC8 V) (CONVERTED LAB)
Acid-Base Excess: 1
Chloride: 107
HCT: 46
Operator id: 198171
Potassium: 3.9
pCO2, Ven: 40.7 — ABNORMAL LOW

## 2011-01-27 LAB — CBC
HCT: 42.7
Hemoglobin: 13.1
Hemoglobin: 14.8
MCHC: 34.1
MCV: 91.3
MCV: 91.6
Platelets: 303
Platelets: 324
RBC: 4.17 — ABNORMAL LOW
RBC: 4.66
RDW: 14.1
WBC: 11.8 — ABNORMAL HIGH

## 2011-01-27 LAB — CK TOTAL AND CKMB (NOT AT ARMC): Total CK: 139

## 2011-01-27 LAB — TSH: TSH: 3.058

## 2011-01-27 LAB — LIPID PANEL
Cholesterol: 220 — ABNORMAL HIGH
LDL Cholesterol: 143 — ABNORMAL HIGH

## 2011-01-27 LAB — POCT CARDIAC MARKERS: Troponin i, poc: <0.05

## 2011-01-27 LAB — PROTIME-INR: Prothrombin Time: 13.5

## 2011-02-09 ENCOUNTER — Telehealth: Payer: Self-pay

## 2011-02-09 DIAGNOSIS — Z0000 Encounter for general adult medical examination without abnormal findings: Secondary | ICD-10-CM

## 2011-02-09 DIAGNOSIS — Z1289 Encounter for screening for malignant neoplasm of other sites: Secondary | ICD-10-CM

## 2011-02-09 NOTE — Telephone Encounter (Signed)
Put order in for labs. 

## 2011-04-25 ENCOUNTER — Encounter: Payer: Self-pay | Admitting: Internal Medicine

## 2011-04-25 DIAGNOSIS — Z0000 Encounter for general adult medical examination without abnormal findings: Secondary | ICD-10-CM | POA: Insufficient documentation

## 2011-05-04 ENCOUNTER — Other Ambulatory Visit (INDEPENDENT_AMBULATORY_CARE_PROVIDER_SITE_OTHER): Payer: BC Managed Care – PPO

## 2011-05-04 DIAGNOSIS — Z0000 Encounter for general adult medical examination without abnormal findings: Secondary | ICD-10-CM

## 2011-05-04 DIAGNOSIS — Z1289 Encounter for screening for malignant neoplasm of other sites: Secondary | ICD-10-CM

## 2011-05-04 LAB — URINALYSIS, ROUTINE W REFLEX MICROSCOPIC
Bilirubin Urine: NEGATIVE
Ketones, ur: NEGATIVE
Leukocytes, UA: NEGATIVE
Urine Glucose: NEGATIVE
Urobilinogen, UA: 0.2 (ref 0.0–1.0)

## 2011-05-04 LAB — CBC WITH DIFFERENTIAL/PLATELET
Basophils Relative: 0.5 % (ref 0.0–3.0)
Eosinophils Relative: 0.8 % (ref 0.0–5.0)
HCT: 42.6 % (ref 39.0–52.0)
Lymphs Abs: 2.1 10*3/uL (ref 0.7–4.0)
MCV: 93 fl (ref 78.0–100.0)
Monocytes Absolute: 0.6 10*3/uL (ref 0.1–1.0)
Monocytes Relative: 7.5 % (ref 3.0–12.0)
Neutrophils Relative %: 66.1 % (ref 43.0–77.0)
Platelets: 292 10*3/uL (ref 150.0–400.0)
RBC: 4.58 Mil/uL (ref 4.22–5.81)
WBC: 8.3 10*3/uL (ref 4.5–10.5)

## 2011-05-04 LAB — BASIC METABOLIC PANEL
BUN: 13 mg/dL (ref 6–23)
CO2: 28 mEq/L (ref 19–32)
GFR: 95.96 mL/min (ref >60.00)
Glucose, Bld: 100 mg/dL — ABNORMAL HIGH (ref 70–99)
Potassium: 3.6 mEq/L (ref 3.5–5.1)
Sodium: 138 mEq/L (ref 135–145)

## 2011-05-04 LAB — LIPID PANEL
HDL: 64.5 mg/dL (ref >39.00)
LDL Cholesterol: 92 mg/dL (ref 0–99)
Total CHOL/HDL Ratio: 3
VLDL: 9.2 mg/dL (ref 0.0–40.0)

## 2011-05-04 LAB — HEPATIC FUNCTION PANEL
ALT: 20 U/L (ref 0–53)
AST: 24 U/L (ref 0–37)
Albumin: 4.4 g/dL (ref 3.5–5.2)
Alkaline Phosphatase: 66 U/L (ref 39–117)
Total Protein: 7.6 g/dL (ref 6.0–8.3)

## 2011-05-04 LAB — TSH: TSH: 1.19 u[IU]/mL (ref 0.35–5.50)

## 2011-05-08 ENCOUNTER — Ambulatory Visit (INDEPENDENT_AMBULATORY_CARE_PROVIDER_SITE_OTHER): Payer: BC Managed Care – PPO | Admitting: Internal Medicine

## 2011-05-08 VITALS — BP 128/80 | HR 68 | Temp 98.0°F | Ht 67.0 in | Wt 195.0 lb

## 2011-05-08 DIAGNOSIS — Z0000 Encounter for general adult medical examination without abnormal findings: Secondary | ICD-10-CM

## 2011-05-08 MED ORDER — BENAZEPRIL HCL 20 MG PO TABS: 20.0000 mg | ORAL_TABLET | Freq: Every day | ORAL | Status: DC

## 2011-05-08 MED ORDER — AMLODIPINE BESYLATE 10 MG PO TABS: 10.0000 mg | ORAL_TABLET | Freq: Every day | ORAL | Status: DC

## 2011-05-08 MED ORDER — TAMSULOSIN HCL 0.4 MG PO CAPS: 0.4000 mg | ORAL_CAPSULE | Freq: Every day | ORAL | Status: DC

## 2011-05-08 NOTE — Patient Instructions (Signed)
Your refills were sent to [facility] Continue all other medications as before Please keep your appointments with your specialists as you have planned - urology You are otherwise up to date with prevention today Please return in 1 year for your yearly visit, or sooner if needed, with Lab testing done 3-5 days before

## 2011-05-08 NOTE — Assessment & Plan Note (Addendum)
ECG reviewed as per emr, Overall doing well, age appropriate education and counseling updated, referrals for preventative services and immunizations addressed, dietary and smoking counseling addressed, most recent labs and ECG reviewed.  I have personally reviewed and have noted: 1) the patient's medical and social history 2) The pt's use of alcohol, tobacco, and illicit drugs 3) The patient's current medications and supplements 4) Functional ability including ADL's, fall risk, home safety risk, hearing and visual impairment 5) Diet and physical activities 6) Evidence for depression or mood disorder 7) The patient's height, weight, and BMI have been recorded in the chart I have made referrals, and provided counseling and education based on review of the above

## 2011-05-09 ENCOUNTER — Encounter: Payer: Self-pay | Admitting: Internal Medicine

## 2011-05-09 NOTE — Progress Notes (Signed)
  Subjective:    Patient ID: [name] [name], male    DOB: 04-Nov-1950, 61 y.o.   MRN: [id number]  HPI  Here for wellness and f/u;  Overall doing ok;  Pt denies CP, worsening SOB, DOE, wheezing, orthopnea, PND, worsening LE edema, palpitations, dizziness or syncope.  Pt denies neurological change such as new Headache, facial or extremity weakness.  Pt denies polydipsia, polyuria, or low sugar symptoms. Pt states overall good compliance with treatment and medications, good tolerability, and trying to follow lower cholesterol diet.  Pt denies worsening depressive symptoms, suicidal ideation or panic. No fever, wt loss, night sweats, loss of appetite, or other constitutional symptoms.  Pt states good ability with ADL's, low fall risk, home safety reviewed and adequate, no significant changes in hearing or vision, and occasionally active with exercise. No acute complaints Past Medical History  Diagnosis Date  . GERD 07/31/2007  . HYPERLIPIDEMIA 01/04/2007  . HYPERTENSION 01/04/2007  . OVERACTIVE BLADDER 03/06/2007  . BENIGN PROSTATIC HYPERTROPHY 05/06/2010  . COMMON MIGRAINE 07/31/2007   No past surgical history on file.  does not have a smoking history on file. He does not have any smokeless tobacco history on file. His alcohol and drug histories not on file. family history is not on file. Allergies  Allergen Reactions  . Celecoxib    No current outpatient prescriptions on file prior to visit.   Review of Systems Review of Systems  Constitutional: Negative for diaphoresis, activity change, appetite change and unexpected weight change.  HENT: Negative for hearing loss, ear pain, facial swelling, mouth sores and neck stiffness.   Eyes: Negative for pain, redness and visual disturbance.  Respiratory: Negative for shortness of breath and wheezing.   Cardiovascular: Negative for chest pain and palpitations.  Gastrointestinal: Negative for diarrhea, blood in stool, abdominal distention and rectal pain.    Genitourinary: Negative for hematuria, flank pain and decreased urine volume.  Musculoskeletal: Negative for myalgias and joint swelling.  Skin: Negative for color change and wound.  Neurological: Negative for syncope and numbness.  Hematological: Negative for adenopathy.  Psychiatric/Behavioral: Negative for hallucinations, self-injury, decreased concentration and agitation.      Objective:   Physical Exam BP 128/80  Pulse 68  Temp(Src) 98 F (36.7 C) (Oral)  Ht 5\' 7"  (1.702 m)  Wt 195 lb (88.451 kg)  BMI 30.54 kg/m2  SpO2 98% Physical Exam  VS noted Constitutional: Pt is oriented to person, place, and time. Appears well-developed and well-nourished.  HENT:  Head: Normocephalic and atraumatic.  Right Ear: External ear normal.  Left Ear: External ear normal.  Nose: Nose normal.  Mouth/Throat: Oropharynx is clear and moist.  Eyes: Conjunctivae and EOM are normal. Pupils are equal, round, and reactive to light.  Neck: Normal range of motion. Neck supple. No JVD present. No tracheal deviation present.  Cardiovascular: Normal rate, regular rhythm, normal heart sounds and intact distal pulses.   Pulmonary/Chest: Effort normal and breath sounds normal.  Abdominal: Soft. Bowel sounds are normal. There is no tenderness.  Musculoskeletal: Normal range of motion. Exhibits no edema.  Lymphadenopathy:  Has no cervical adenopathy.  Neurological: Pt is alert and oriented to person, place, and time. Pt has normal reflexes. No cranial nerve deficit.  Skin: Skin is warm and dry. No rash noted.  Psychiatric:  Has  normal mood and affect. Behavior is normal.     Assessment & Plan:

## 2011-08-10 ENCOUNTER — Encounter: Payer: Self-pay | Admitting: Internal Medicine

## 2011-08-10 ENCOUNTER — Ambulatory Visit (INDEPENDENT_AMBULATORY_CARE_PROVIDER_SITE_OTHER): Payer: BC Managed Care – PPO | Admitting: Internal Medicine

## 2011-08-10 ENCOUNTER — Other Ambulatory Visit (INDEPENDENT_AMBULATORY_CARE_PROVIDER_SITE_OTHER): Payer: BC Managed Care – PPO

## 2011-08-10 VITALS — BP 120/80 | HR 63 | Temp 97.2°F | Ht 67.0 in | Wt 197.4 lb

## 2011-08-10 DIAGNOSIS — I10 Essential (primary) hypertension: Secondary | ICD-10-CM

## 2011-08-10 DIAGNOSIS — R109 Unspecified abdominal pain: Secondary | ICD-10-CM

## 2011-08-10 DIAGNOSIS — K5900 Constipation, unspecified: Secondary | ICD-10-CM

## 2011-08-10 DIAGNOSIS — K429 Umbilical hernia without obstruction or gangrene: Secondary | ICD-10-CM

## 2011-08-10 DIAGNOSIS — K439 Ventral hernia without obstruction or gangrene: Secondary | ICD-10-CM

## 2011-08-10 LAB — URINALYSIS, ROUTINE W REFLEX MICROSCOPIC
Nitrite: NEGATIVE
Specific Gravity, Urine: 1.01 (ref 1.000–1.030)
Total Protein, Urine: NEGATIVE
pH: 7 (ref 5.0–8.0)

## 2011-08-10 LAB — BASIC METABOLIC PANEL
BUN: 11 mg/dL (ref 6–23)
Calcium: 9.6 mg/dL (ref 8.4–10.5)
GFR: 99.27 mL/min (ref >60.00)
Glucose, Bld: 98 mg/dL (ref 70–99)
Potassium: 3.7 mEq/L (ref 3.5–5.1)

## 2011-08-10 LAB — HEPATIC FUNCTION PANEL
AST: 22 U/L (ref 0–37)
Albumin: 4.3 g/dL (ref 3.5–5.2)

## 2011-08-10 LAB — CBC WITH DIFFERENTIAL/PLATELET
Eosinophils Relative: 0.2 % (ref 0.0–5.0)
HCT: 46.9 % (ref 39.0–52.0)
Lymphocytes Relative: 24.6 % (ref 12.0–46.0)
Lymphs Abs: 2.1 10*3/uL (ref 0.7–4.0)
Monocytes Relative: 7.9 % (ref 3.0–12.0)
Neutrophils Relative %: 67.1 % (ref 43.0–77.0)
Platelets: 305 10*3/uL (ref 150.0–400.0)
WBC: 8.4 10*3/uL (ref 4.5–10.5)

## 2011-08-10 LAB — LIPASE: Lipase: 21 U/L (ref 11.0–59.0)

## 2011-08-10 LAB — H. PYLORI ANTIBODY, IGG: H Pylori IgG: NEGATIVE

## 2011-08-10 MED ORDER — OMEPRAZOLE 20 MG PO CPDR: 20.0000 mg | DELAYED_RELEASE_CAPSULE | Freq: Every day | ORAL | Status: DC

## 2011-08-10 NOTE — Patient Instructions (Signed)
Take all new medications as prescribed - the omeprazole 20 mg per day (generic prilosec) Continue all other medications as before Please go to LAB in the Basement for the blood and/or urine tests to be done today You will be contacted regarding the referral for: ultrasound If not improved in 3-5 days, please call for Gi referral

## 2011-08-11 ENCOUNTER — Encounter: Payer: Self-pay | Admitting: Internal Medicine

## 2011-08-11 ENCOUNTER — Ambulatory Visit
Admission: RE | Admit: 2011-08-11 | Discharge: 2011-08-11 | Disposition: A | Discharge status: Home / Self Care | Payer: BC Managed Care – PPO | Source: Ambulatory Visit | Attending: Internal Medicine | Admitting: Internal Medicine

## 2011-08-11 DIAGNOSIS — R109 Unspecified abdominal pain: Secondary | ICD-10-CM

## 2011-08-13 ENCOUNTER — Encounter: Payer: Self-pay | Admitting: Internal Medicine

## 2011-08-13 DIAGNOSIS — K5900 Constipation, unspecified: Secondary | ICD-10-CM | POA: Insufficient documentation

## 2011-08-13 DIAGNOSIS — K439 Ventral hernia without obstruction or gangrene: Secondary | ICD-10-CM | POA: Insufficient documentation

## 2011-08-13 DIAGNOSIS — K429 Umbilical hernia without obstruction or gangrene: Secondary | ICD-10-CM | POA: Insufficient documentation

## 2011-08-13 NOTE — Progress Notes (Signed)
Subjective:    Patient ID: [name] [name], male    DOB: [birth year], 61 y.o.   MRN: [id number]  HPI  Here with 1 wk somewhat vague abd discomfort, pressure like, dull without fever, n/v, does somewhat radiate to the back, has some ongoing constipation but not clear to pt this is worse, overall mild in the past, no recent blood, grad worse over the wk, assoc with bloating, early satiety but no wt loss.  Life has been stressful recent, out of work, working now as unpaid Education officer, environmental.  Food makes no better or worse, tried one antacid pill yest but not clear if helped;   Denies urinary symptoms such as dysuria, frequency, urgency,or hematuria.  Pt denies chest pain, increased sob or doe, wheezing, orthopnea, PND, increased LE swelling, palpitations, dizziness or syncope.   Past Medical History  Diagnosis Date  . GERD 07/31/2007  . HYPERLIPIDEMIA 01/04/2007  . HYPERTENSION 01/04/2007  . OVERACTIVE BLADDER 03/06/2007  . BENIGN PROSTATIC HYPERTROPHY 05/06/2010  . COMMON MIGRAINE 07/31/2007   No past surgical history on file.  reports that he has never smoked. He does not have any smokeless tobacco history on file. His alcohol and drug histories not on file. family history is not on file. Allergies  Allergen Reactions  . Celecoxib    Current Outpatient Prescriptions on File Prior to Visit  Medication Sig Dispense Refill  . amLODipine (NORVASC) 10 MG tablet Take 1 tablet (10 mg total) by mouth daily.  90 tablet  3  . aspirin 81 MG tablet Take 160 mg by mouth daily.        . benazepril (LOTENSIN) 20 MG tablet Take 1 tablet (20 mg total) by mouth daily.  90 tablet  3  . polyethylene glycol (MIRALAX / GLYCOLAX) packet Take 17 g by mouth daily.        . rosuvastatin (CRESTOR) 40 MG tablet Take 40 mg by mouth daily.        . Tamsulosin HCl (FLOMAX) 0.4 MG CAPS Take 1 capsule (0.4 mg total) by mouth daily.  30 capsule  11  . omeprazole (PRILOSEC) 20 MG capsule Take 1 capsule (20 mg total) by mouth daily.  30  capsule  5   Review of Systems Review of Systems  Constitutional: Negative for diaphoresis and unexpected weight change.  Gastrointestinal: Negative for vomiting and blood in stool.  Genitourinary: Negative for hematuria and decreased urine volume.  Musculoskeletal: Negative for gait problem.  Skin: Negative for color change and wound.  Neurological: Negative for tremors and numbness.     Objective:   Physical Exam BP 120/80  Pulse 63  Temp(Src) 97.2 F (36.2 C) (Oral)  Ht 5\' 7"  (1.702 m)  Wt 197 lb 6 oz (89.529 kg)  BMI 30.91 kg/m2  SpO2 98% Physical Exam  VS noted Constitutional: Pt appears well-developed and well-nourished.  HENT: Head: Normocephalic.  Right Ear: External ear normal.  Left Ear: External ear normal.  Eyes: Conjunctivae and EOM are normal. Pupils are equal, round, and reactive to light.  Neck: Normal range of motion. Neck supple.  Cardiovascular: Normal rate and regular rhythm.   Pulmonary/Chest: Effort normal and breath sounds normal.  Abd:  Soft, NT, non-distended, + BS, except for mild RUQ/epigastric tender wtihout guarding or rebound Has nontender ventral and umbilical hernias - nontender Neurological: Pt is alert. No cranial nerve deficit.  Skin: Skin is warm. No erythema.  Psychiatric: Pt behavior is normal. Thought content normal. 1+ nervous, not depressed affect  Assessment & Plan:

## 2011-08-13 NOTE — Assessment & Plan Note (Signed)
Etiology unclear, for labs today and abd u/s to r/o GB, trial PPI, but suspect possible functional etiology such as mild worsening constipation

## 2011-08-13 NOTE — Assessment & Plan Note (Signed)
Asympt, doubt related to current symptoms

## 2011-08-13 NOTE — Assessment & Plan Note (Signed)
Asympt, doubt related to current symptoms  

## 2011-08-13 NOTE — Assessment & Plan Note (Signed)
stable overall by hx and exam, most recent data reviewed with pt, and pt to continue medical treatment as before BP Readings from Last 3 Encounters:  08/10/11 120/80  05/08/11 128/80  06/29/10 118/68

## 2011-08-13 NOTE — Assessment & Plan Note (Signed)
?   IBS related constipation, consider rx tx if not improved further with otc such as miralax

## 2011-11-01 ENCOUNTER — Ambulatory Visit (INDEPENDENT_AMBULATORY_CARE_PROVIDER_SITE_OTHER): Payer: BC Managed Care – PPO | Admitting: Emergency Medicine

## 2011-11-01 VITALS — BP 119/72 | HR 66 | Temp 98.6°F | Resp 16 | Ht 66.0 in | Wt 200.4 lb

## 2011-11-01 DIAGNOSIS — M546 Pain in thoracic spine: Secondary | ICD-10-CM

## 2011-11-01 MED ORDER — CYCLOBENZAPRINE HCL 10 MG PO TABS: 10.0000 mg | ORAL_TABLET | Freq: Three times a day (TID) | ORAL | Status: AC | PRN

## 2011-11-01 MED ORDER — NAPROXEN SODIUM 550 MG PO TABS: 550.0000 mg | ORAL_TABLET | Freq: Two times a day (BID) | ORAL | Status: DC

## 2011-11-01 NOTE — Patient Instructions (Addendum)
Back Pain, Adult Low back pain is very common. About 1 in 5 people have back pain.The cause of low back pain is rarely dangerous. The pain often gets better over time.About half of people with a sudden onset of back pain feel better in just 2 weeks. About 8 in 10 people feel better by 6 weeks.  CAUSES Some common causes of back pain include:  Strain of the muscles or ligaments supporting the spine.   Wear and tear (degeneration) of the spinal discs.   Arthritis.   Direct injury to the back.  DIAGNOSIS Most of the time, the direct cause of low back pain is not known.However, back pain can be treated effectively even when the exact cause of the pain is unknown.Answering your caregiver's questions about your overall health and symptoms is one of the most accurate ways to make sure the cause of your pain is not dangerous. If your caregiver needs more information, he or she may order lab work or imaging tests (X-rays or MRIs).However, even if imaging tests show changes in your back, this usually does not require surgery. HOME CARE INSTRUCTIONS For many people, back pain returns.Since low back pain is rarely dangerous, it is often a condition that people can learn to manageon their own.   Remain active. It is stressful on the back to sit or stand in one place. Do not sit, drive, or stand in one place for more than 30 minutes at a time. Take short walks on level surfaces as soon as pain allows.Try to increase the length of time you walk each day.   Do not stay in bed.Resting more than 1 or 2 days can delay your recovery.   Do not avoid exercise or work.Your body is made to move.It is not dangerous to be active, even though your back may hurt.Your back will likely heal faster if you return to being active before your pain is gone.   Pay attention to your body when you bend and lift. Many people have less discomfortwhen lifting if they bend their knees, keep the load close to their  bodies,and avoid twisting. Often, the most comfortable positions are those that put less stress on your recovering back.   Find a comfortable position to sleep. Use a firm mattress and lie on your side with your knees slightly bent. If you lie on your back, put a pillow under your knees.   Only take over-the-counter or prescription medicines as directed by your caregiver. Over-the-counter medicines to reduce pain and inflammation are often the most helpful.Your caregiver may prescribe muscle relaxant drugs.These medicines help dull your pain so you can more quickly return to your normal activities and healthy exercise.   Put ice on the injured area.   Put ice in a plastic bag.   Place a towel between your skin and the bag.   Leave the ice on for 15 to 20 minutes, 3 to 4 times a day for the first 2 to 3 days. After that, ice and heat may be alternated to reduce pain and spasms.   Ask your caregiver about trying back exercises and gentle massage. This may be of some benefit.   Avoid feeling anxious or stressed.Stress increases muscle tension and can worsen back pain.It is important to recognize when you are anxious or stressed and learn ways to manage it.Exercise is a great option.  SEEK MEDICAL CARE IF:  You have pain that is not relieved with rest or medicine.   You have   pain that does not improve in 1 week.   You have new symptoms.   You are generally not feeling well.  SEEK IMMEDIATE MEDICAL CARE IF:   You have pain that radiates from your back into your legs.   You develop new bowel or bladder control problems.   You have unusual weakness or numbness in your arms or legs.   You develop nausea or vomiting.   You develop abdominal pain.   You feel faint.  Document Released: 04/24/2005 Document Revised: 04/13/2011 Document Reviewed: 09/12/2010 [employer] Patient Information 2012 [employer]. 

## 2011-11-01 NOTE — Progress Notes (Signed)
  Subjective:    Patient ID: [name] [name], male    DOB: Oct 29, 1950, 61 y.o.   MRN: [id number]  HPI Comments: No history of direct injury.  Has right flank pain radiating around to abdomen for three weeks intermittently.  No other complaint.  Began after loading a truck.  No neurologic complaints, numbness tingling or weakness  Back Pain This is a new problem. The current episode started 1 to 4 weeks ago. The problem occurs daily. The problem is unchanged. The pain is present in the thoracic spine. The quality of the pain is described as aching. The pain is at a severity of 3/10. The pain is worse during the night. The symptoms are aggravated by bending and twisting. Pertinent negatives include no abdominal pain, bladder incontinence, bowel incontinence, chest pain, dysuria, fever, headaches, leg pain, numbness, paresis, paresthesias, pelvic pain, perianal numbness, tingling, weakness or weight loss. Risk factors include lack of exercise. He has tried nothing for the symptoms.      Review of Systems  Constitutional: Negative.  Negative for fever and weight loss.  HENT: Negative.   Respiratory: Negative.   Cardiovascular: Negative for chest pain.  Gastrointestinal: Negative.  Negative for abdominal pain and bowel incontinence.  Genitourinary: Negative.  Negative for bladder incontinence, dysuria and pelvic pain.  Musculoskeletal: Positive for back pain.  Skin: Negative.   Neurological: Negative for tingling, weakness, numbness, headaches and paresthesias.       Objective:   Physical Exam  Constitutional: He appears well-developed and well-nourished.  HENT:  Head: Normocephalic and atraumatic.  Eyes: Conjunctivae are normal.  Neck: Normal range of motion. Neck supple.  Cardiovascular: Normal rate and regular rhythm.   Pulmonary/Chest: Effort normal and breath sounds normal.  Abdominal: Soft. Bowel sounds are normal. There is no tenderness.  Musculoskeletal: Normal range of motion. He  exhibits tenderness.       Back:  Neurological: He is alert. He exhibits normal muscle tone. Coordination normal.  Skin: Skin is warm and dry.          Assessment & Plan:  Thoracic strain.  Meds, local heat.  Has no allergy to NSAIDS per patient.  Follow up in one week if no improvement

## 2012-03-15 ENCOUNTER — Ambulatory Visit (INDEPENDENT_AMBULATORY_CARE_PROVIDER_SITE_OTHER): Payer: BC Managed Care – PPO | Admitting: Emergency Medicine

## 2012-03-15 VITALS — BP 124/72 | HR 62 | Temp 98.2°F | Resp 16 | Ht 67.0 in | Wt 196.0 lb

## 2012-03-15 DIAGNOSIS — Z23 Encounter for immunization: Secondary | ICD-10-CM

## 2012-03-15 DIAGNOSIS — IMO0001 Reserved for inherently not codable concepts without codable children: Secondary | ICD-10-CM

## 2012-03-15 DIAGNOSIS — M791 Myalgia, unspecified site: Secondary | ICD-10-CM

## 2012-03-15 LAB — POCT CBC
HCT, POC: 46.7 % (ref 43.5–53.7)
Hemoglobin: 15.2 g/dL (ref 14.1–18.1)
Lymph, poc: 2.1 (ref 0.6–3.4)
MCH, POC: 30.6 pg (ref 27–31.2)
MCHC: 32.5 g/dL (ref 31.8–35.4)
MCV: 94.1 fL (ref 80–97)
RBC: 4.96 M/uL (ref 4.69–6.13)
WBC: 8.1 10*3/uL (ref 4.6–10.2)

## 2012-03-15 LAB — COMPREHENSIVE METABOLIC PANEL
CO2: 30 mEq/L (ref 19–32)
Calcium: 9.7 mg/dL (ref 8.4–10.5)
Chloride: 100 mEq/L (ref 96–112)
Creat: 0.89 mg/dL (ref 0.50–1.35)
Glucose, Bld: 98 mg/dL (ref 70–99)
Total Bilirubin: 1 mg/dL (ref 0.3–1.2)

## 2012-03-15 LAB — POCT SEDIMENTATION RATE: POCT SED RATE: 17 mm/hr (ref 0–22)

## 2012-03-15 LAB — CK: Total CK: 135 U/L (ref 7–232)

## 2012-03-15 NOTE — Progress Notes (Signed)
[facility] [address] [place] [address] [phone]  Date:  03/15/2012   Name:  [name] [name]   DOB:  07-29-1950   MRN:  [id number]  PCP:  Oliver Barre, MD    Chief Complaint: Muscle Pain and Headache   History of Present Illness:  [name] [name] is a 61 y.o. very pleasant male patient who presents with the following:  3 days ago had an upper respiratory infection that he describes as a head cold that is now resolved.  Over the same time has experienced muscle aches in his arms and legs.  No fever or chills, cough, nausea or vomiting.  No nasal congestion or drainage or sore throat.  No GI or GU symptoms.  Says that his legs feel "restless".  No claudication.  No numbness or weakness.  Denies any difficulty in past with his statin treatment.  Denies tick exposure.  No history of rash.   Patient Active Problem List  Diagnosis  . HYPERLIPIDEMIA  . COMMON MIGRAINE  . HYPERTENSION  . GERD  . OVERACTIVE BLADDER  . PROSTATITIS, ACUTE  . SKIN LESION  . DYSPNEA  . FREQUENCY, URINARY  . BENIGN PROSTATIC HYPERTROPHY  . BACK PAIN  . Nocturia  . Preventative health care  . Abdominal  pain, other specified site  . Constipation  . Ventral hernia  . Umbilical hernia    Past Medical History  Diagnosis Date  . GERD 07/31/2007  . HYPERLIPIDEMIA 01/04/2007  . HYPERTENSION 01/04/2007  . OVERACTIVE BLADDER 03/06/2007  . BENIGN PROSTATIC HYPERTROPHY 05/06/2010  . COMMON MIGRAINE 07/31/2007    No past surgical history on file.  History  Substance Use Topics  . Smoking status: Never Smoker   . Smokeless tobacco: Not on file  . Alcohol Use: Not on file    History reviewed. No pertinent family history.  Allergies  Allergen Reactions  . Celecoxib Other (See Comments)    Stroke like symptoms    Medication list has been reviewed and updated.  Current Outpatient Prescriptions on File Prior to Visit  Medication Sig Dispense Refill  . amLODipine  (NORVASC) 10 MG tablet Take 1 tablet (10 mg total) by mouth daily.  90 tablet  3  . aspirin 81 MG tablet Take 81 mg by mouth daily.       . benazepril (LOTENSIN) 20 MG tablet Take 1 tablet (20 mg total) by mouth daily.  90 tablet  3  . polyethylene glycol (MIRALAX / GLYCOLAX) packet Take 17 g by mouth as needed.       . rosuvastatin (CRESTOR) 40 MG tablet Take 40 mg by mouth daily.        . Tamsulosin HCl (FLOMAX) 0.4 MG CAPS Take 1 capsule (0.4 mg total) by mouth daily.  30 capsule  11  . naproxen sodium (ANAPROX DS) 550 MG tablet Take 1 tablet (550 mg total) by mouth 2 (two) times daily with a meal.  40 tablet  0  . omeprazole (PRILOSEC) 20 MG capsule Take 1 capsule (20 mg total) by mouth daily.  30 capsule  5    Review of Systems:  As per HPI, otherwise negative.    Physical Examination: Filed Vitals:   03/15/12 0849  BP: 124/72  Pulse: 62  Temp: 98.2 F (36.8 C)  Resp: 16   Filed Vitals:   03/15/12 0849  Height: 5\' 7"  (1.702 m)  Weight: 196 lb (88.905 kg)   Body mass index is 30.70  kg/(m^2). Ideal Body Weight: Weight in (lb) to have BMI = 25: 159.3   GEN: WDWN, NAD, Non-toxic, A & O x 3  No rash or sepsis HEENT: Atraumatic, Normocephalic. Neck supple. No masses, No LAD.  Oropharynx negative Ears and Nose: No external deformity.  TM negative CV: RRR, No M/G/R. No JVD. No thrill. No extra heart sounds. PULM: CTA B, no wheezes, crackles, rhonchi. No retractions. No resp. distress. No accessory muscle use. ABD: S, NT, ND, +BS. No rebound. No HSM. EXTR: No c/c/e NEURO Normal gait.  Normal muscle strength PSYCH: Normally interactive. Conversant. Not depressed or anxious appearing.  Calm demeanor.    Assessment and Plan: myalgais Labs Follow up based on labs  Carmelina Dane, MD  Results for orders placed in visit on 03/15/12  POCT CBC      Component Value Range   WBC 8.1  4.6 - 10.2 K/uL   Lymph, poc 2.1  0.6 - 3.4   POC LYMPH PERCENT 25.7  10 - 50 %L   MID  (cbc) 0.5  0 - 0.9   POC MID % 6.7  0 - 12 %M   POC Granulocyte 5.5  2 - 6.9   Granulocyte percent 67.6  37 - 80 %G   RBC 4.96  4.69 - 6.13 M/uL   Hemoglobin 15.2  14.1 - 18.1 g/dL   HCT, POC 13.0  86.5 - 53.7 %   MCV 94.1  80 - 97 fL   MCH, POC 30.6  27 - 31.2 pg   MCHC 32.5  31.8 - 35.4 g/dL   RDW, POC 78.4     Platelet Count, POC 323  142 - 424 K/uL   MPV 9.4  0 - 99.8 fL

## 2012-03-18 LAB — EHRLICHIA ANTIBODY PANEL
E chaffeensis (HGE) Ab, IgG: NEGATIVE
E chaffeensis (HGE) Ab, IgM: NEGATIVE

## 2012-03-19 LAB — B. BURGDORFI ANTIBODIES BY WB: B burgdorferi IgM Abs (IB): NEGATIVE

## 2012-05-07 ENCOUNTER — Other Ambulatory Visit (INDEPENDENT_AMBULATORY_CARE_PROVIDER_SITE_OTHER): Payer: BC Managed Care – PPO

## 2012-05-07 DIAGNOSIS — Z0000 Encounter for general adult medical examination without abnormal findings: Secondary | ICD-10-CM

## 2012-05-07 LAB — CBC WITH DIFFERENTIAL/PLATELET
Basophils Absolute: 0 10*3/uL (ref 0.0–0.1)
Eosinophils Absolute: 0 10*3/uL (ref 0.0–0.7)
Lymphocytes Relative: 26.7 % (ref 12.0–46.0)
MCHC: 33.2 g/dL (ref 30.0–36.0)
MCV: 91.6 fl (ref 78.0–100.0)
Monocytes Absolute: 0.5 10*3/uL (ref 0.1–1.0)
Neutrophils Relative %: 66.2 % (ref 43.0–77.0)
Platelets: 344 10*3/uL (ref 150.0–400.0)
RBC: 4.93 Mil/uL (ref 4.22–5.81)

## 2012-05-07 LAB — HEPATIC FUNCTION PANEL
Alkaline Phosphatase: 66 U/L (ref 39–117)
Bilirubin, Direct: 0.1 mg/dL (ref 0.0–0.3)
Total Bilirubin: 0.9 mg/dL (ref 0.3–1.2)
Total Protein: 8 g/dL (ref 6.0–8.3)

## 2012-05-07 LAB — URINALYSIS, ROUTINE W REFLEX MICROSCOPIC
Ketones, ur: NEGATIVE
Leukocytes, UA: NEGATIVE
Specific Gravity, Urine: 1.015 (ref 1.000–1.030)
Urine Glucose: NEGATIVE
Urobilinogen, UA: 0.2 (ref 0.0–1.0)
pH: 8 (ref 5.0–8.0)

## 2012-05-07 LAB — BASIC METABOLIC PANEL
BUN: 9 mg/dL (ref 6–23)
CO2: 29 mEq/L (ref 19–32)
Chloride: 102 mEq/L (ref 96–112)
Creatinine, Ser: 1 mg/dL (ref 0.4–1.5)
Glucose, Bld: 107 mg/dL — ABNORMAL HIGH (ref 70–99)
Potassium: 3.7 mEq/L (ref 3.5–5.1)

## 2012-05-07 LAB — PSA: PSA: 2.81 ng/mL (ref 0.10–4.00)

## 2012-05-07 LAB — LIPID PANEL
Cholesterol: 221 mg/dL — ABNORMAL HIGH (ref 0–200)
Total CHOL/HDL Ratio: 3
VLDL: 15 mg/dL (ref 0.0–40.0)

## 2012-05-07 LAB — LDL CHOLESTEROL, DIRECT: Direct LDL: 138.5 mg/dL

## 2012-05-09 ENCOUNTER — Encounter: Payer: Self-pay | Admitting: Internal Medicine

## 2012-05-09 ENCOUNTER — Ambulatory Visit (INDEPENDENT_AMBULATORY_CARE_PROVIDER_SITE_OTHER): Payer: BC Managed Care – PPO | Admitting: Internal Medicine

## 2012-05-09 VITALS — BP 134/80 | HR 62 | Temp 97.6°F | Ht 66.0 in | Wt 197.1 lb

## 2012-05-09 DIAGNOSIS — N318 Other neuromuscular dysfunction of bladder: Secondary | ICD-10-CM

## 2012-05-09 DIAGNOSIS — E785 Hyperlipidemia, unspecified: Secondary | ICD-10-CM

## 2012-05-09 DIAGNOSIS — I10 Essential (primary) hypertension: Secondary | ICD-10-CM

## 2012-05-09 DIAGNOSIS — Z0000 Encounter for general adult medical examination without abnormal findings: Secondary | ICD-10-CM

## 2012-05-09 DIAGNOSIS — R3129 Other microscopic hematuria: Secondary | ICD-10-CM

## 2012-05-09 MED ORDER — BENAZEPRIL HCL 20 MG PO TABS: 20.0000 mg | ORAL_TABLET | Freq: Every day | ORAL | Status: DC

## 2012-05-09 MED ORDER — ROSUVASTATIN CALCIUM 40 MG PO TABS: 40.0000 mg | ORAL_TABLET | Freq: Every day | ORAL | Status: DC

## 2012-05-09 MED ORDER — SOLIFENACIN SUCCINATE 5 MG PO TABS: 10.0000 mg | ORAL_TABLET | Freq: Every day | ORAL | Status: DC

## 2012-05-09 MED ORDER — OMEPRAZOLE 20 MG PO CPDR: 20.0000 mg | DELAYED_RELEASE_CAPSULE | Freq: Every day | ORAL | Status: DC

## 2012-05-09 MED ORDER — AMLODIPINE BESYLATE 10 MG PO TABS: 10.0000 mg | ORAL_TABLET | Freq: Every day | ORAL | Status: DC

## 2012-05-09 NOTE — Progress Notes (Signed)
Subjective:    Patient ID: [name] [name], male    DOB: 07/25/1950, 62 y.o.   MRN: [id number]  HPI  Here for wellness and f/u;  Overall doing ok;  Pt denies CP, worsening SOB, DOE, wheezing, orthopnea, PND, worsening LE edema, palpitations, dizziness or syncope.  Pt denies neurological change such as new Headache, facial or extremity weakness.  Pt denies polydipsia, polyuria, or low sugar symptoms. Pt states overall good compliance with treatment and medications, good tolerability, and trying to follow lower cholesterol diet.  Pt denies worsening depressive symptoms, suicidal ideation or panic. No fever, wt loss, night sweats, loss of appetite, or other constitutional symptoms.  Pt states good ability with ADL's, low fall risk, home safety reviewed and adequate, no significant changes in hearing or vision, and occasionally active with exercise.  Has not noticed gross hematuria but lab review today shows ongoing mild chronic microhemturia, not previously addressed. Sees yrly Dr Tannenbaum/urolgy for BPH .  Has not been taking the crestor lately as he should. Past Medical History  Diagnosis Date  . GERD 07/31/2007  . HYPERLIPIDEMIA 01/04/2007  . HYPERTENSION 01/04/2007  . OVERACTIVE BLADDER 03/06/2007  . BENIGN PROSTATIC HYPERTROPHY 05/06/2010  . COMMON MIGRAINE 07/31/2007   No past surgical history on file.  reports that he has never smoked. He does not have any smokeless tobacco history on file. His alcohol and drug histories not on file. family history is not on file. Allergies  Allergen Reactions  . Celecoxib Other (See Comments)    Stroke like symptoms   Current Outpatient Prescriptions on File Prior to Visit  Medication Sig Dispense Refill  . amLODipine (NORVASC) 10 MG tablet Take 1 tablet (10 mg total) by mouth daily.  90 tablet  3  . aspirin 81 MG tablet Take 81 mg by mouth daily.       . benazepril (LOTENSIN) 20 MG tablet Take 1 tablet (20 mg total) by mouth daily.  90 tablet  3  .  omeprazole (PRILOSEC) 20 MG capsule Take 1 capsule (20 mg total) by mouth daily.  30 capsule  5  . polyethylene glycol (MIRALAX / GLYCOLAX) packet Take 17 g by mouth as needed.       . rosuvastatin (CRESTOR) 40 MG tablet Take 40 mg by mouth daily.        . Tamsulosin HCl (FLOMAX) 0.4 MG CAPS Take 1 capsule (0.4 mg total) by mouth daily.  30 capsule  11   Review of Systems Review of Systems  Constitutional: Negative for diaphoresis, activity change, appetite change and unexpected weight change.  HENT: Negative for hearing loss, ear pain, facial swelling, mouth sores and neck stiffness.   Eyes: Negative for pain, redness and visual disturbance.  Respiratory: Negative for shortness of breath and wheezing.   Cardiovascular: Negative for chest pain and palpitations.  Gastrointestinal: Negative for diarrhea, blood in stool, abdominal distention and rectal pain.  Genitourinary: Negative for hematuria, flank pain and decreased urine volume.  Musculoskeletal: Negative for myalgias and joint swelling.  Skin: Negative for color change and wound.  Neurological: Negative for syncope and numbness.  Hematological: Negative for adenopathy.  Psychiatric/Behavioral: Negative for hallucinations, self-injury, decreased concentration and agitation.      Objective:   Physical Exam BP 134/80  Pulse 62  Temp 97.6 F (36.4 C) (Oral)  Ht 5\' 6"  (1.676 m)  Wt 197 lb 2 oz (89.415 kg)  BMI 31.82 kg/m2  SpO2 97% Physical Exam  VS noted Constitutional: Pt is oriented  to person, place, and time. Appears well-developed and well-nourished.  HENT:  Head: Normocephalic and atraumatic.  Right Ear: External ear normal.  Left Ear: External ear normal.  Nose: Nose normal.  Mouth/Throat: Oropharynx is clear and moist.  Eyes: Conjunctivae and EOM are normal. Pupils are equal, round, and reactive to light.  Neck: Normal range of motion. Neck supple. No JVD present. No tracheal deviation present.  Cardiovascular: Normal  rate, regular rhythm, normal heart sounds and intact distal pulses.   Pulmonary/Chest: Effort normal and breath sounds normal.  Abdominal: Soft. Bowel sounds are normal. There is no tenderness.  Musculoskeletal: Normal range of motion. Exhibits no edema.  Lymphadenopathy:  Has no cervical adenopathy.  Neurological: Pt is alert and oriented to person, place, and time. Pt has normal reflexes. No cranial nerve deficit.  Skin: Skin is warm and dry. No rash noted.  Psychiatric:  Has  normal mood and affect. Behavior is normal.      Assessment & Plan:

## 2012-05-09 NOTE — Assessment & Plan Note (Signed)

## 2012-05-09 NOTE — Assessment & Plan Note (Signed)
Has seen urology for BPH yearly for 3 yrs, but also has recurring microhematuria I think not addressed; will ask pt to return

## 2012-05-09 NOTE — Assessment & Plan Note (Signed)
To re-start crestor

## 2012-05-09 NOTE — Assessment & Plan Note (Signed)
stable overall by hx and exam, most recent data reviewed with pt, and pt to continue medical treatment as before BP Readings from Last 3 Encounters:  05/09/12 134/80  03/15/12 124/72  11/01/11 119/72

## 2012-05-09 NOTE — Patient Instructions (Addendum)
Take all new medications as prescribed - the Vesicare (which you can stop if you dont see any improvement in the frequency of urination after one month) Continue all other medications as before All of your medications were refilled today to the [facility] Please have the pharmacy call with any other other refills you may need. Please continue your efforts at being more active, low cholesterol diet, and weight control. Thank you for enrolling in MyChart. Please follow the instructions below to securely access your online medical record. MyChart allows you to send messages to your doctor, view your test results, renew your prescriptions, schedule appointments, and more. To Log into MyChart, please go to [email or web address], and your Username is: [email or web address] You will be contacted regarding the referral for: urology/Dr Patsi Sears - for the recurring blood cells in the urine Please return in 6 months, or sooner if needed

## 2012-05-09 NOTE — Assessment & Plan Note (Signed)
?   Bladder vs prostate irritative symptom, for vesicare 5 qd

## 2012-06-20 NOTE — Progress Notes (Signed)
Reviewed and agree.

## 2012-10-29 ENCOUNTER — Ambulatory Visit (INDEPENDENT_AMBULATORY_CARE_PROVIDER_SITE_OTHER): Payer: BC Managed Care – PPO | Admitting: Internal Medicine

## 2012-10-29 ENCOUNTER — Other Ambulatory Visit: Payer: Self-pay | Admitting: Internal Medicine

## 2012-10-29 ENCOUNTER — Other Ambulatory Visit (INDEPENDENT_AMBULATORY_CARE_PROVIDER_SITE_OTHER): Payer: BC Managed Care – PPO

## 2012-10-29 ENCOUNTER — Encounter: Payer: Self-pay | Admitting: Internal Medicine

## 2012-10-29 VITALS — BP 120/68 | HR 69 | Temp 98.5°F | Ht 67.0 in | Wt 201.5 lb

## 2012-10-29 DIAGNOSIS — E785 Hyperlipidemia, unspecified: Secondary | ICD-10-CM

## 2012-10-29 DIAGNOSIS — Z0000 Encounter for general adult medical examination without abnormal findings: Secondary | ICD-10-CM

## 2012-10-29 DIAGNOSIS — I10 Essential (primary) hypertension: Secondary | ICD-10-CM

## 2012-10-29 DIAGNOSIS — K219 Gastro-esophageal reflux disease without esophagitis: Secondary | ICD-10-CM

## 2012-10-29 DIAGNOSIS — G43009 Migraine without aura, not intractable, without status migrainosus: Secondary | ICD-10-CM

## 2012-10-29 LAB — LDL CHOLESTEROL, DIRECT: Direct LDL: 139.2 mg/dL

## 2012-10-29 LAB — LIPID PANEL: Triglycerides: 129 mg/dL (ref 0.0–149.0)

## 2012-10-29 MED ORDER — ATORVASTATIN CALCIUM 10 MG PO TABS: 10.0000 mg | ORAL_TABLET | Freq: Every day | ORAL | Status: DC

## 2012-10-29 MED ORDER — ROSUVASTATIN CALCIUM 40 MG PO TABS: 40.0000 mg | ORAL_TABLET | Freq: Every day | ORAL | Status: DC

## 2012-10-29 NOTE — Assessment & Plan Note (Signed)
stable overall by history and exam, recent data reviewed with pt, and pt to continue medical treatment as before,  to f/u any worsening symptoms or concerns Lab Results  Component Value Date   WBC 8.2 05/07/2012   HGB 15.0 05/07/2012   HCT 45.2 05/07/2012   PLT 344.0 05/07/2012   GLUCOSE 107* 05/07/2012   CHOL 221* 05/07/2012   TRIG 75.0 05/07/2012   HDL 69.30 05/07/2012   LDLDIRECT 138.5 05/07/2012   LDLCALC 92 05/04/2011   ALT 32 05/07/2012   AST 29 05/07/2012   NA 139 05/07/2012   K 3.7 05/07/2012   CL 102 05/07/2012   CREATININE 1.0 05/07/2012   BUN 9 05/07/2012   CO2 29 05/07/2012   TSH 1.19 05/07/2012   PSA 2.81 05/07/2012   INR 1.0 06/24/2007   HGBA1C  Value: 5.9 (NOTE)   The ADA recommends the following therapeutic goals for glycemic   control related to Hgb A1C measurement:   Goal of Therapy:   < 7.0% Hgb A1C   Action Suggested:  > 8.0% Hgb A1C   Ref:  Diabetes Care, 22, Suppl. 1, 1999 06/24/2007

## 2012-10-29 NOTE — Assessment & Plan Note (Signed)
stable overall by history and exam, recent data reviewed with pt, could not tolerate crestor though not clear to me if med was assoc with his weakness,  to f/u any worsening symptoms or concerns, f/u lipids today, consider lipitor rx Lab Results  Component Value Date   LDLCALC 92 05/04/2011

## 2012-10-29 NOTE — Assessment & Plan Note (Signed)
stable overall by history and exam, recent data reviewed with pt, and pt to continue medical treatment as before,  to f/u any worsening symptoms or concerns BP Readings from Last 3 Encounters:  10/29/12 120/68  05/09/12 134/80  03/15/12 124/72

## 2012-10-29 NOTE — Progress Notes (Signed)
Subjective:    Patient ID: [name] [name], male    DOB: 12/22/1950, 62 y.o.   MRN: [id number]  HPI  Here to f/u; overall doing ok,  Pt denies chest pain, increased sob or doe, wheezing, orthopnea, PND, increased LE swelling, palpitations, dizziness or syncope.  Pt denies polydipsia, polyuria, or low sugar symptoms such as weakness or confusion improved with po intake.  Pt denies new neurological symptoms such as new headache, or facial or extremity weakness or numbness.   Pt states overall good compliance with meds, has been trying to follow lower cholesterol diet, with wt overall stable,  but little exercise however.  Had to stop the crestor just in the past 3 wks due to "sluggish and weak", using a OTC supplement (red yeast rice) instead. Has a nagging mild intermittent hsarp pain to left head for 2 days, some sensitive to touch. Denies worsening reflux, abd pain, dysphagia, n/v, bowel change or blood.  Past Medical History  Diagnosis Date  . GERD 07/31/2007  . HYPERLIPIDEMIA 01/04/2007  . HYPERTENSION 01/04/2007  . OVERACTIVE BLADDER 03/06/2007  . BENIGN PROSTATIC HYPERTROPHY 05/06/2010  . COMMON MIGRAINE 07/31/2007   No past surgical history on file.  reports that he has never smoked. He does not have any smokeless tobacco history on file. His alcohol and drug histories are not on file. family history is not on file. Allergies  Allergen Reactions  . Celecoxib Other (See Comments)    Stroke like symptoms  . Crestor (Rosuvastatin)     weakness   Current Outpatient Prescriptions on File Prior to Visit  Medication Sig Dispense Refill  . amLODipine (NORVASC) 10 MG tablet Take 1 tablet (10 mg total) by mouth daily.  90 tablet  3  . aspirin 81 MG tablet Take 81 mg by mouth daily.       . benazepril (LOTENSIN) 20 MG tablet Take 1 tablet (20 mg total) by mouth daily.  90 tablet  3  . omeprazole (PRILOSEC) 20 MG capsule Take 1 capsule (20 mg total) by mouth daily.  90 capsule  3  . polyethylene  glycol (MIRALAX / GLYCOLAX) packet Take 17 g by mouth as needed.       . solifenacin (VESICARE) 5 MG tablet Take 2 tablets (10 mg total) by mouth daily.  90 tablet  3  . Tamsulosin HCl (FLOMAX) 0.4 MG CAPS Take 1 capsule (0.4 mg total) by mouth daily.  30 capsule  11   No current facility-administered medications on file prior to visit.    Review of Systems  Constitutional: Negative for unexpected weight change, or unusual diaphoresis  HENT: Negative for tinnitus.   Eyes: Negative for photophobia and visual disturbance.  Respiratory: Negative for choking and stridor.   Gastrointestinal: Negative for vomiting and blood in stool.  Genitourinary: Negative for hematuria and decreased urine volume.  Musculoskeletal: Negative for acute joint swelling Skin: Negative for color change and wound.  Neurological: Negative for tremors and numbness other than noted  Psychiatric/Behavioral: Negative for decreased concentration or  hyperactivity.       Objectivie   Physical Exam BP 120/68  Pulse 69  Temp(Src) 98.5 F (36.9 C) (Oral)  Ht 5\' 7"  (1.702 m)  Wt 201 lb 8 oz (91.4 kg)  BMI 31.55 kg/m2  SpO2 97% VS noted,  Constitutional: Pt appears well-developed and well-nourished.  HENT: Head: NCAT.  Right Ear: External ear normal.  Left Ear: External ear normal.  Eyes: Conjunctivae and EOM are normal. Pupils are  equal, round, and reactive to light.  Neck: Normal range of motion. Neck supple.  Cardiovascular: Normal rate and regular rhythm.   Pulmonary/Chest: Effort normal and breath sounds normal.  Abd:  Soft, NT, non-distended, + BS Neurological: Pt is alert. Not confused , cn 2-12 intact, motor 5/5 Skin: Skin is warm. No erythema.  Psychiatric: Pt behavior is normal. Thought content normal.     Assessment & Plan:

## 2012-10-29 NOTE — Patient Instructions (Signed)
Please continue all other medications as before, and refills have been done if requested. Please have the pharmacy call with any other refills you may need. Please continue your efforts at being more active, low cholesterol diet, and weight control. Please go to the LAB in the Basement (turn left off the elevator) for the tests to be done today - ONLY the cholesterol today You will be contacted by phone if any changes need to be made immediately.  Otherwise, you will receive a letter about your results with an explanation, but please check with MyChart first.  Please remember to sign up for My Chart if you have not done so, as this will be important to you in the future with finding out test results, communicating by private email, and scheduling acute appointments online when needed.  Please return in 6 months, or sooner if needed, with Lab testing done 3-5 days before

## 2012-10-29 NOTE — Assessment & Plan Note (Signed)
Not clear if recent c/w this, but o/w stable, exam benign, no neuro changes,  to f/u any worsening symptoms or concerns

## 2012-11-11 ENCOUNTER — Ambulatory Visit: Payer: BC Managed Care – PPO | Admitting: Internal Medicine

## 2012-12-24 ENCOUNTER — Other Ambulatory Visit: Payer: Self-pay

## 2012-12-24 MED ORDER — OMEPRAZOLE 20 MG PO CPDR: 20.0000 mg | DELAYED_RELEASE_CAPSULE | Freq: Every day | ORAL | Status: DC

## 2013-02-03 ENCOUNTER — Ambulatory Visit (INDEPENDENT_AMBULATORY_CARE_PROVIDER_SITE_OTHER): Payer: BC Managed Care – PPO

## 2013-02-03 DIAGNOSIS — Z23 Encounter for immunization: Secondary | ICD-10-CM

## 2013-03-01 ENCOUNTER — Ambulatory Visit (INDEPENDENT_AMBULATORY_CARE_PROVIDER_SITE_OTHER): Payer: BC Managed Care – PPO | Admitting: Family Medicine

## 2013-03-01 VITALS — BP 140/80 | HR 65 | Temp 98.4°F | Resp 16 | Ht 68.0 in | Wt 198.0 lb

## 2013-03-01 DIAGNOSIS — J209 Acute bronchitis, unspecified: Secondary | ICD-10-CM

## 2013-03-01 MED ORDER — AZITHROMYCIN 250 MG PO TABS: ORAL_TABLET | ORAL | Status: DC

## 2013-03-01 NOTE — Progress Notes (Signed)
[address]   [place], [place]  [address]   [phone]  Subjective:  This chart was scribed for Ethelda Chick, MD by Karle Plumber, ED Scribe. This patient was seen in room 12.   Patient ID: [name] [name], male    DOB: [birth year], 62 y.o.   MRN: [id number]  HPI HPI Comments:  [name] [name] is a 62 y.o. male who presents to [facility] complaining of intermittent productive cough with yellow sputum and congestion onset 3 days. He reports nasal congestion and sore throat. He states he has been taking Mucinex, NyQuil, and DayQuil. Pt denies sick contacts as far as he is aware. Pt denies chills, fever, diaphoresis, otalgia, SOB, wheezing, nausea, vomiting, and diarrhea. Pt is a retired Psychologist, occupational. He denies smoking. Pt states his PCP is Dr. Jonny Ruiz.   Review of Systems  Constitutional: Negative for fever, chills and diaphoresis.  HENT: Positive for congestion and sore throat. Negative for ear pain.        Nasal congestion  Respiratory: Positive for cough. Negative for shortness of breath and wheezing.   Gastrointestinal: Negative for nausea, vomiting and diarrhea.   Past Medical History  Diagnosis Date  . GERD 07/31/2007  . HYPERLIPIDEMIA 01/04/2007  . HYPERTENSION 01/04/2007  . OVERACTIVE BLADDER 03/06/2007  . BENIGN PROSTATIC HYPERTROPHY 05/06/2010  . COMMON MIGRAINE 07/31/2007   Allergies  Allergen Reactions  . Celecoxib Other (See Comments)    Stroke like symptoms  . Crestor [Rosuvastatin]     weakness   Current Outpatient Prescriptions on File Prior to Visit  Medication Sig Dispense Refill  . amLODipine (NORVASC) 10 MG tablet Take 1 tablet (10 mg total) by mouth daily.  90 tablet  3  . aspirin 81 MG tablet Take 81 mg by mouth daily.       Marland Kitchen atorvastatin (LIPITOR) 10 MG tablet Take 1 tablet (10 mg total) by mouth daily.  90 tablet  3  . benazepril (LOTENSIN) 20 MG tablet Take 1 tablet (20 mg total) by mouth daily.  90 tablet  3  . polyethylene glycol (MIRALAX / GLYCOLAX) packet Take  17 g by mouth as needed.       . Tamsulosin HCl (FLOMAX) 0.4 MG CAPS Take 1 capsule (0.4 mg total) by mouth daily.  30 capsule  11  . omeprazole (PRILOSEC) 20 MG capsule Take 1 capsule (20 mg total) by mouth daily.  30 capsule  11  . solifenacin (VESICARE) 5 MG tablet Take 2 tablets (10 mg total) by mouth daily.  90 tablet  3   No current facility-administered medications on file prior to visit.   History   Social History  . Marital Status: Married    Spouse Name: N/A    Number of Children: N/A  . Years of Education: N/A   Occupational History  . Not on file.   Social History Main Topics  . Smoking status: Never Smoker   . Smokeless tobacco: Not on file  . Alcohol Use: Not on file  . Drug Use: Not on file  . Sexual Activity: Not on file   Other Topics Concern  . Not on file   Social History Narrative  . No narrative on file        Objective:   Physical Exam  Nursing note and vitals reviewed. Constitutional: He is oriented to person, place, and time. He appears well-developed and well-nourished. No distress.  HENT:  Head: Normocephalic and atraumatic.  Right Ear: External ear normal.  Left Ear: External ear normal.  Mouth/Throat: Oropharynx is clear and moist. No oropharyngeal exudate.  Nasal congestion  Eyes: Conjunctivae are normal. No scleral icterus.  Neck: Neck supple.  Cardiovascular: Normal rate, regular rhythm, normal heart sounds and intact distal pulses.   No murmur heard. Pulmonary/Chest: Effort normal and breath sounds normal. No stridor. No respiratory distress. He has no wheezes. He has no rales.  Abdominal: Normal appearance. He exhibits no distension.  Lymphadenopathy:    He has no cervical adenopathy.  Neurological: He is alert and oriented to person, place, and time.  Skin: Skin is warm and dry. No rash noted.  Psychiatric: He has a normal mood and affect. His behavior is normal.    Triage Vitals: BP 140/80  Pulse 65  Temp(Src) 98.4 F (36.9  C) (Oral)  Resp 16  Ht 5\' 8"  (1.727 m)  Wt 198 lb (89.812 kg)  BMI 30.11 kg/m2  SpO2 98%     Assessment & Plan:  Acute bronchitis  1. Acute bronchitis: New.  Rx for Zpack provided; recommend Mucinex bid; continue Nyquil qhs.  RTC acute worsening or development of SOB.  Meds ordered this encounter  Medications  . azithromycin (ZITHROMAX) 250 MG tablet    Sig: Take 2 tabs PO x 1 dose, then 1 tab PO QD x 4 days    Dispense:  6 tablet    Refill:  0     I personally performed the services described in this documentation, which was scribed in my presence.  The recorded information has been reviewed and is accurate.

## 2013-03-01 NOTE — Patient Instructions (Signed)
1. Take Mucinex one pill twice daily for congestion. 2.  Use Nyquil at bedtime. 3.   Take Zpack as prescribed.  Bronchitis Bronchitis is the body's way of reacting to injury and/or infection (inflammation) of the bronchi. Bronchi are the air tubes that extend from the windpipe into the lungs. If the inflammation becomes severe, it may cause shortness of breath. CAUSES  Inflammation may be caused by:  A virus.  Germs (bacteria).  Dust.  Allergens.  Pollutants and many other irritants. The cells lining the bronchial tree are covered with tiny hairs (cilia). These constantly beat upward, away from the lungs, toward the mouth. This keeps the lungs free of pollutants. When these cells become too irritated and are unable to do their job, mucus begins to develop. This causes the characteristic cough of bronchitis. The cough clears the lungs when the cilia are unable to do their job. Without either of these protective mechanisms, the mucus would settle in the lungs. Then you would develop pneumonia. Smoking is a common cause of bronchitis and can contribute to pneumonia. Stopping this habit is the single most important thing you can do to help yourself. TREATMENT   Your caregiver may prescribe an antibiotic if the cough is caused by bacteria. Also, medicines that open up your airways make it easier to breathe. Your caregiver may also recommend or prescribe an expectorant. It will loosen the mucus to be coughed up. Only take over-the-counter or prescription medicines for pain, discomfort, or fever as directed by your caregiver.  Removing whatever causes the problem (smoking, for example) is critical to preventing the problem from getting worse.  Cough suppressants may be prescribed for relief of cough symptoms.  Inhaled medicines may be prescribed to help with symptoms now and to help prevent problems from returning.  For those with recurrent (chronic) bronchitis, there may be a need for steroid  medicines. SEEK IMMEDIATE MEDICAL CARE IF:   During treatment, you develop more pus-like mucus (purulent sputum).  You have a fever.  Your baby is older than 3 months with a rectal temperature of 102 F (38.9 C) or higher.  Your baby is 72 months old or younger with a rectal temperature of 100.4 F (38 C) or higher.  You become progressively more ill.  You have increased difficulty breathing, wheezing, or shortness of breath. It is necessary to seek immediate medical care if you are elderly or sick from any other disease. MAKE SURE YOU:   Understand these instructions.  Will watch your condition.  Will get help right away if you are not doing well or get worse. Document Released: 04/24/2005 Document Revised: 07/17/2011 Document Reviewed: 03/03/2008 [facility] Patient Information 2014 [place], [place].

## 2013-04-16 ENCOUNTER — Encounter: Payer: Self-pay | Admitting: Internal Medicine

## 2013-04-16 ENCOUNTER — Other Ambulatory Visit (INDEPENDENT_AMBULATORY_CARE_PROVIDER_SITE_OTHER): Payer: BC Managed Care – PPO

## 2013-04-16 ENCOUNTER — Ambulatory Visit (INDEPENDENT_AMBULATORY_CARE_PROVIDER_SITE_OTHER): Payer: BC Managed Care – PPO | Admitting: Internal Medicine

## 2013-04-16 VITALS — BP 132/82 | HR 64 | Temp 97.0°F | Ht 67.0 in | Wt 201.0 lb

## 2013-04-16 DIAGNOSIS — Z0000 Encounter for general adult medical examination without abnormal findings: Secondary | ICD-10-CM

## 2013-04-16 LAB — LIPID PANEL
HDL: 68.3 mg/dL (ref >39.00)
LDL Cholesterol: 108 mg/dL — ABNORMAL HIGH (ref 0–99)
VLDL: 12 mg/dL (ref 0.0–40.0)

## 2013-04-16 LAB — URINALYSIS, ROUTINE W REFLEX MICROSCOPIC
Ketones, ur: NEGATIVE
Leukocytes, UA: NEGATIVE
Specific Gravity, Urine: 1.015 (ref 1.000–1.030)
Total Protein, Urine: NEGATIVE
Urine Glucose: NEGATIVE
pH: 7.5 (ref 5.0–8.0)

## 2013-04-16 LAB — CBC WITH DIFFERENTIAL/PLATELET
Eosinophils Relative: 0.7 % (ref 0.0–5.0)
HCT: 46.9 % (ref 39.0–52.0)
Hemoglobin: 15.7 g/dL (ref 13.0–17.0)
Lymphs Abs: 2.3 10*3/uL (ref 0.7–4.0)
Monocytes Relative: 7.3 % (ref 3.0–12.0)
Neutro Abs: 6 10*3/uL (ref 1.4–7.7)
WBC: 9.1 10*3/uL (ref 4.5–10.5)

## 2013-04-16 LAB — HEPATIC FUNCTION PANEL
Albumin: 4.6 g/dL (ref 3.5–5.2)
Alkaline Phosphatase: 72 U/L (ref 39–117)
Total Bilirubin: 0.9 mg/dL (ref 0.3–1.2)

## 2013-04-16 LAB — BASIC METABOLIC PANEL
BUN: 12 mg/dL (ref 6–23)
Calcium: 9.6 mg/dL (ref 8.4–10.5)
GFR: 101.12 mL/min (ref >60.00)
Glucose, Bld: 105 mg/dL — ABNORMAL HIGH (ref 70–99)
Sodium: 139 mEq/L (ref 135–145)

## 2013-04-16 LAB — PSA: PSA: 3.11 ng/mL (ref 0.10–4.00)

## 2013-04-16 NOTE — Progress Notes (Signed)
Pre-visit discussion using our clinic review tool. No additional management support is needed unless otherwise documented below in the visit note.  

## 2013-04-16 NOTE — Assessment & Plan Note (Signed)

## 2013-04-16 NOTE — Progress Notes (Signed)
Subjective:    Patient ID: [name] [name], male    DOB: Aug 17, 1950, 62 y.o.   MRN: [id number]  HPI  Here for wellness and f/u;  Overall doing ok;  Pt denies CP, worsening SOB, DOE, wheezing, orthopnea, PND, worsening LE edema, palpitations, dizziness or syncope.  Pt denies neurological change such as new headache, facial or extremity weakness.  Pt denies polydipsia, polyuria, or low sugar symptoms. Pt states overall good compliance with treatment and medications, good tolerability, and has been trying to follow lower cholesterol diet.  Pt denies worsening depressive symptoms, suicidal ideation or panic. No fever, night sweats, wt loss, loss of appetite, or other constitutional symptoms.  Pt states good ability with ADL's, has low fall risk, home safety reviewed and adequate, no other significant changes in hearing or vision, and only occasionally active with exercise. No acute complaints. 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  Has yearly f/u with urology soon Past Medical History  Diagnosis Date  . GERD 07/31/2007  . HYPERLIPIDEMIA 01/04/2007  . HYPERTENSION 01/04/2007  . OVERACTIVE BLADDER 03/06/2007  . BENIGN PROSTATIC HYPERTROPHY 05/06/2010  . COMMON MIGRAINE 07/31/2007   No past surgical history on file.  reports that he has never smoked. He does not have any smokeless tobacco history on file. His alcohol and drug histories are not on file. family history is not on file. Allergies  Allergen Reactions  . Celecoxib Other (See Comments)    Stroke like symptoms  . Crestor [Rosuvastatin]     weakness   Current Outpatient Prescriptions on File Prior to Visit  Medication Sig Dispense Refill  . amLODipine (NORVASC) 10 MG tablet Take 1 tablet (10 mg total) by mouth daily.  90 tablet  3  . aspirin 81 MG tablet Take 81 mg by mouth daily.       Marland Kitchen atorvastatin (LIPITOR) 10 MG tablet Take 1 tablet (10 mg total) by mouth daily.  90 tablet  3  . benazepril (LOTENSIN) 20 MG tablet Take 1 tablet  (20 mg total) by mouth daily.  90 tablet  3  . omeprazole (PRILOSEC) 20 MG capsule Take 1 capsule (20 mg total) by mouth daily.  30 capsule  11  . polyethylene glycol (MIRALAX / GLYCOLAX) packet Take 17 g by mouth as needed.       . solifenacin (VESICARE) 5 MG tablet Take 2 tablets (10 mg total) by mouth daily.  90 tablet  3  . Tamsulosin HCl (FLOMAX) 0.4 MG CAPS Take 1 capsule (0.4 mg total) by mouth daily.  30 capsule  11   No current facility-administered medications on file prior to visit.   Review of Systems Constitutional: Negative for diaphoresis, activity change, appetite change or unexpected weight change.  HENT: Negative for hearing loss, ear pain, facial swelling, mouth sores and neck stiffness.   Eyes: Negative for pain, redness and visual disturbance.  Respiratory: Negative for shortness of breath and wheezing.   Cardiovascular: Negative for chest pain and palpitations.  Gastrointestinal: Negative for diarrhea, blood in stool, abdominal distention or other pain Genitourinary: Negative for hematuria, flank pain or change in urine volume.  Musculoskeletal: Negative for myalgias and joint swelling.  Skin: Negative for color change and wound.  Neurological: Negative for syncope and numbness. other than noted Hematological: Negative for adenopathy.  Psychiatric/Behavioral: Negative for hallucinations, self-injury, decreased concentration and agitation.      Objective:   Physical Exam BP 132/82  Pulse 64  Temp(Src) 97 F (36.1 C) (Oral)  Ht 5\' 7"  (1.702 m)  Wt 201 lb (91.173 kg)  BMI 31.47 kg/m2  SpO2 97% VS noted,  Constitutional: Pt is oriented to person, place, and time. Appears well-developed and well-nourished.  Head: Normocephalic and atraumatic.  Right Ear: External ear normal.  Left Ear: External ear normal.  Nose: Nose normal.  Mouth/Throat: Oropharynx is clear and moist.  Eyes: Conjunctivae and EOM are normal. Pupils are equal, round, and reactive to light.    Neck: Normal range of motion. Neck supple. No JVD present. No tracheal deviation present.  Cardiovascular: Normal rate, regular rhythm, normal heart sounds and intact distal pulses.   Pulmonary/Chest: Effort normal and breath sounds normal.  Abdominal: Soft. Bowel sounds are normal. There is no tenderness. No HSM  Musculoskeletal: Normal range of motion. Exhibits no edema.  Lymphadenopathy:  Has no cervical adenopathy.  Neurological: Pt is alert and oriented to person, place, and time. Pt has normal reflexes. No cranial nerve deficit.  Skin: Skin is warm and dry. No rash noted.  Psychiatric:  Has  normal mood and affect. Behavior is normal. mild nervous    Assessment & Plan:

## 2013-04-16 NOTE — Patient Instructions (Addendum)
Please continue all other medications as before, and refills have been done if requested. Please have the pharmacy call with any other refills you may need. Please continue your efforts at being more active, low cholesterol diet, and weight control. You are otherwise up to date with prevention measures today. Please go to the LAB in the Basement (turn left off the elevator) for the tests to be done today You will be contacted by phone if any changes need to be made immediately.  Otherwise, you will receive a letter about your results with an explanation, but please check with MyChart first.  Please keep your appointments with your specialists as you have planned - urology (and labs today should be forwarded there)  Please remember to sign up for My Chart if you have not done so, as this will be important to you in the future with finding out test results, communicating by private email, and scheduling acute appointments online when needed.  Please return in 1 year for your yearly visit, or sooner if needed  OK to re-schedule the appt in Jan 2015

## 2013-04-24 ENCOUNTER — Other Ambulatory Visit: Payer: Self-pay | Admitting: Internal Medicine

## 2013-05-06 ENCOUNTER — Telehealth: Payer: Self-pay | Admitting: Internal Medicine

## 2013-05-06 MED ORDER — ATORVASTATIN CALCIUM 10 MG PO TABS: 10.0000 mg | ORAL_TABLET | Freq: Every day | ORAL | Status: DC

## 2013-05-06 MED ORDER — AMLODIPINE BESYLATE 10 MG PO TABS: 10.0000 mg | ORAL_TABLET | Freq: Every day | ORAL | Status: DC

## 2013-05-06 NOTE — Telephone Encounter (Signed)
Pt request new rx to be send into [facility] on [address]. Pt request Atorvastatin, Amlodipine and Benazeprile. Please advise

## 2013-05-09 ENCOUNTER — Other Ambulatory Visit: Payer: Self-pay | Admitting: *Deleted

## 2013-05-09 MED ORDER — BENAZEPRIL HCL 20 MG PO TABS: ORAL_TABLET | ORAL | Status: DC

## 2013-05-27 DIAGNOSIS — H5200 Hypermetropia, unspecified eye: Secondary | ICD-10-CM | POA: Diagnosis not present

## 2013-05-27 DIAGNOSIS — H40009 Preglaucoma, unspecified, unspecified eye: Secondary | ICD-10-CM | POA: Diagnosis not present

## 2013-05-27 DIAGNOSIS — H52229 Regular astigmatism, unspecified eye: Secondary | ICD-10-CM | POA: Diagnosis not present

## 2013-05-27 DIAGNOSIS — H524 Presbyopia: Secondary | ICD-10-CM | POA: Diagnosis not present

## 2013-07-11 ENCOUNTER — Ambulatory Visit: Payer: Medicare Other

## 2013-07-11 ENCOUNTER — Ambulatory Visit (INDEPENDENT_AMBULATORY_CARE_PROVIDER_SITE_OTHER): Payer: Medicare Other | Admitting: Family Medicine

## 2013-07-11 VITALS — BP 128/80 | HR 78 | Temp 98.5°F | Resp 16 | Ht 67.0 in | Wt 196.0 lb

## 2013-07-11 DIAGNOSIS — R05 Cough: Secondary | ICD-10-CM

## 2013-07-11 DIAGNOSIS — J9801 Acute bronchospasm: Secondary | ICD-10-CM | POA: Diagnosis not present

## 2013-07-11 DIAGNOSIS — R059 Cough, unspecified: Secondary | ICD-10-CM

## 2013-07-11 MED ORDER — BENZONATATE 100 MG PO CAPS: 100.0000 mg | ORAL_CAPSULE | Freq: Three times a day (TID) | ORAL | Status: DC | PRN

## 2013-07-11 MED ORDER — ALBUTEROL SULFATE HFA 108 (90 BASE) MCG/ACT IN AERS: 2.0000 | INHALATION_SPRAY | Freq: Four times a day (QID) | RESPIRATORY_TRACT | Status: DC | PRN

## 2013-07-11 NOTE — Patient Instructions (Signed)
I do not think that you have a bacterial infection.  Use the inhaler as needed for cough attack, and try the tessalon perles to help prevent cough. Let me know if you are not better in the next few days- Sooner if worse.

## 2013-07-11 NOTE — Progress Notes (Signed)
[facility] [address] [place] [address] [phone]  Date:  07/11/2013   Name:  [name] [name]   DOB:  1950-10-19   MRN:  [id number]  PCP:  [name], MD    Chief Complaint: Cough   History of Present Illness:  [name] [name] is a 63 y.o. very pleasant male patient who presents with the following:  Here today with illness.  PMHx as below-  He is here today with a dry cough. If he takes a deep breath or exhales hard he will cough. Non- productive.  It may "hurt my chest" when he coughs but no other CP.  He feels congested.   He has not noted any fever, chills or body aches. He has not noted a runny or stuffy nose, no ST, occasional sneeze.   He is not aware of any wheezing.   No GI symptoms.    He did try some OTC medication- dimetapp?  Did not seem to help too much.    His BP is well controlled.  No sick contacts at home.  The cough is not keeping him awake at night.    Patient Active Problem List   Diagnosis Date Noted  . Hematuria, microscopic 05/09/2012  . Constipation 08/13/2011  . Ventral hernia 08/13/2011  . Umbilical hernia 92/42/6834  . Preventative health care 04/25/2011  . Nocturia 06/29/2010  . BENIGN PROSTATIC HYPERTROPHY 05/06/2010  . BACK PAIN 05/06/2010  . DYSPNEA 07/28/2009  . SKIN LESION 11/30/2008  . FREQUENCY, URINARY 11/30/2008  . COMMON MIGRAINE 07/31/2007  . GERD 07/31/2007  . OVERACTIVE BLADDER 03/06/2007  . HYPERLIPIDEMIA 01/04/2007  . HYPERTENSION 01/04/2007  . PROSTATITIS, ACUTE 01/04/2007    Past Medical History  Diagnosis Date  . GERD 07/31/2007  . HYPERLIPIDEMIA 01/04/2007  . HYPERTENSION 01/04/2007  . OVERACTIVE BLADDER 03/06/2007  . BENIGN PROSTATIC HYPERTROPHY 05/06/2010  . COMMON MIGRAINE 07/31/2007    History reviewed. No pertinent past surgical history.  History  Substance Use Topics  . Smoking status: Never Smoker   . Smokeless tobacco: Not on file  . Alcohol Use: Not on file    History  reviewed. No pertinent family history.  Allergies  Allergen Reactions  . Celecoxib Other (See Comments)    Stroke like symptoms  . Crestor [Rosuvastatin]     weakness    Medication list has been reviewed and updated.  Current Outpatient Prescriptions on File Prior to Visit  Medication Sig Dispense Refill  . amLODipine (NORVASC) 10 MG tablet Take 1 tablet (10 mg total) by mouth daily.  90 tablet  3  . aspirin 81 MG tablet Take 81 mg by mouth daily.       Marland Kitchen atorvastatin (LIPITOR) 10 MG tablet Take 1 tablet (10 mg total) by mouth daily.  90 tablet  3  . benazepril (LOTENSIN) 20 MG tablet TAKE 1 TABLET (20 MG TOTAL) BY MOUTH DAILY.  90 tablet  3  . omeprazole (PRILOSEC) 20 MG capsule Take 1 capsule (20 mg total) by mouth daily.  30 capsule  11  . polyethylene glycol (MIRALAX / GLYCOLAX) packet Take 17 g by mouth as needed.       . Tamsulosin HCl (FLOMAX) 0.4 MG CAPS Take 1 capsule (0.4 mg total) by mouth daily.  30 capsule  11  . solifenacin (VESICARE) 5 MG tablet Take 2 tablets (10 mg total) by mouth daily.  90 tablet  3   No current facility-administered medications on file prior  to visit.    Review of Systems:  As per HPI- otherwise negative.   Physical Examination: Filed Vitals:   07/11/13 0947  BP: 128/80  Pulse: 78  Temp: 98.5 F (36.9 C)  Resp: 16   Filed Vitals:   07/11/13 0947  Height: 5\' 7"  (1.702 m)  Weight: 196 lb (88.905 kg)   Body mass index is 30.69 kg/(m^2). Ideal Body Weight: Weight in (lb) to have BMI = 25: 159.3  GEN: WDWN, NAD, Non-toxic, A & O x 3, looks well HEENT: Atraumatic, Normocephalic. Neck supple. No masses, No LAD.  Bilateral TM wnl, oropharynx normal.  PEERL,EOMI.  Ears and Nose: No external deformity. CV: RRR, No M/G/R. No JVD. No thrill. No extra heart sounds. PULM: CTA B, no wheezes, crackles, rhonchi. No retractions. No resp. distress. No accessory muscle use. ABD: S, NT, ND. No rebound. No HSM. EXTR: No c/c/e NEURO Normal gait.   PSYCH: Normally interactive. Conversant. Not depressed or anxious appearing.  Calm demeanor.   [facility] reading (PRIMARY) by  Dr. Lorelei Pont. CXR: negative CHEST 2 VIEW  COMPARISON: None.  FINDINGS: Mild thoracic spondylosis. Cardiac and mediastinal margins appear normal. The lungs appear clear. No pleural effusion observed.  IMPRESSION: 1. No acute findings. 2. Thoracic spondylosis.  Assessment and Plan: Cough - Plan: DG Chest 2 View, albuterol (PROVENTIL HFA;VENTOLIN HFA) 108 (90 BASE) MCG/ACT inhaler, benzonatate (TESSALON) 100 MG capsule  Bronchospasm  Reassured that I do not think he needs abx at this time. Albuterol as needed for bronchospasm, tessalon perles prn See patient instructions for more details.     Signed Lamar Blinks, MD

## 2013-07-15 ENCOUNTER — Ambulatory Visit (INDEPENDENT_AMBULATORY_CARE_PROVIDER_SITE_OTHER): Payer: Medicare Other | Admitting: Internal Medicine

## 2013-07-15 ENCOUNTER — Encounter: Payer: Self-pay | Admitting: Internal Medicine

## 2013-07-15 VITALS — BP 122/82 | HR 65 | Temp 98.2°F | Ht 67.0 in | Wt 197.5 lb

## 2013-07-15 DIAGNOSIS — I10 Essential (primary) hypertension: Secondary | ICD-10-CM

## 2013-07-15 DIAGNOSIS — Z23 Encounter for immunization: Secondary | ICD-10-CM

## 2013-07-15 DIAGNOSIS — J45909 Unspecified asthma, uncomplicated: Secondary | ICD-10-CM | POA: Diagnosis not present

## 2013-07-15 MED ORDER — AZITHROMYCIN 250 MG PO TABS: ORAL_TABLET | ORAL | Status: DC

## 2013-07-15 MED ORDER — PREDNISONE 10 MG PO TABS: ORAL_TABLET | ORAL | Status: DC

## 2013-07-15 NOTE — Assessment & Plan Note (Signed)
stable overall by history and exam, recent data reviewed with pt, and pt to continue medical treatment as before,  to f/u any worsening symptoms or concerns BP Readings from Last 3 Encounters:  07/15/13 122/82  07/11/13 128/80  04/16/13 132/82

## 2013-07-15 NOTE — Assessment & Plan Note (Signed)
With mild worsening, recent cxr mar 6 reviewed with pt - no pna, ok for zpack, predpack x 1, cont other meds as before,  to f/u any worsening symptoms or concerns

## 2013-07-15 NOTE — Addendum Note (Signed)
Addended by: Harl Bowie on: 07/15/2013 02:14 PM   Modules accepted: Orders

## 2013-07-15 NOTE — Progress Notes (Signed)
Subjective:    Patient ID: [name] [name], male    DOB: [birth year], 63 y.o.   MRN: [id number]  HPI  Here to f/u, unfort with persistent now mild prod cough ? Greenish sputum, inhaler helps but not enough, no fever and Pt denies chest pain, orthopnea, PND, increased LE swelling, palpitations, dizziness or syncope.  Due for prevnar pneumonia shot, wants to do today Past Medical History  Diagnosis Date  . GERD 07/31/2007  . HYPERLIPIDEMIA 01/04/2007  . HYPERTENSION 01/04/2007  . OVERACTIVE BLADDER 03/06/2007  . BENIGN PROSTATIC HYPERTROPHY 05/06/2010  . COMMON MIGRAINE 07/31/2007   No past surgical history on file.  reports that he has never smoked. He does not have any smokeless tobacco history on file. His alcohol and drug histories are not on file. family history is not on file. Allergies  Allergen Reactions  . Celecoxib Other (See Comments)    Stroke like symptoms  . Crestor [Rosuvastatin]     weakness   Current Outpatient Prescriptions on File Prior to Visit  Medication Sig Dispense Refill  . albuterol (PROVENTIL HFA;VENTOLIN HFA) 108 (90 BASE) MCG/ACT inhaler Inhale 2 puffs into the lungs every 6 (six) hours as needed for wheezing or shortness of breath.  1 Inhaler  2  . amLODipine (NORVASC) 10 MG tablet Take 1 tablet (10 mg total) by mouth daily.  90 tablet  3  . aspirin 81 MG tablet Take 81 mg by mouth daily.       Marland Kitchen atorvastatin (LIPITOR) 10 MG tablet Take 1 tablet (10 mg total) by mouth daily.  90 tablet  3  . benazepril (LOTENSIN) 20 MG tablet TAKE 1 TABLET (20 MG TOTAL) BY MOUTH DAILY.  90 tablet  3  . benzonatate (TESSALON) 100 MG capsule Take 1 capsule (100 mg total) by mouth 3 (three) times daily as needed for cough.  40 capsule  0  . omeprazole (PRILOSEC) 20 MG capsule Take 1 capsule (20 mg total) by mouth daily.  30 capsule  11  . polyethylene glycol (MIRALAX / GLYCOLAX) packet Take 17 g by mouth as needed.       . solifenacin (VESICARE) 5 MG tablet Take 2 tablets (10 mg  total) by mouth daily.  90 tablet  3  . Tamsulosin HCl (FLOMAX) 0.4 MG CAPS Take 1 capsule (0.4 mg total) by mouth daily.  30 capsule  11   No current facility-administered medications on file prior to visit.    Review of Systems  Constitutional: Negative for unexpected weight change, or unusual diaphoresis  HENT: Negative for tinnitus.   Eyes: Negative for photophobia and visual disturbance.  Respiratory: Negative for choking and stridor.   Gastrointestinal: Negative for vomiting and blood in stool.  Genitourinary: Negative for hematuria and decreased urine volume.  Musculoskeletal: Negative for acute joint swelling Skin: Negative for color change and wound.  Neurological: Negative for tremors and numbness other than noted  Psychiatric/Behavioral: Negative for decreased concentration or  hyperactivity.       Objective:   Physical Exam BP 122/82  Pulse 65  Temp(Src) 98.2 F (36.8 C) (Oral)  Ht 5\' 7"  (1.702 m)  Wt 197 lb 8 oz (89.585 kg)  BMI 30.93 kg/m2  SpO2 97% VS noted, mild ill Constitutional: Pt appears well-developed and well-nourished.  HENT: Head: NCAT.  Right Ear: External ear normal.  Left Ear: External ear normal.  Bilat tm's with mild erythema.  Max sinus areas non tender.  Pharynx with mild erythema, no exudate Eyes:  Conjunctivae and EOM are normal. Pupils are equal, round, and reactive to light.  Neck: Normal range of motion. Neck supple.  Cardiovascular: Normal rate and regular rhythm.   Pulmonary/Chest: Effort normal and breath sounds decreased bilat, no overt rales or wheezing Neurological: Pt is alert. Not confused  Skin: Skin is warm. No erythema.  Psychiatric: Pt behavior is normal. Thought content normal.     Assessment & Plan:

## 2013-07-15 NOTE — Patient Instructions (Addendum)
You had the new Prevnar pneumonia shot today  Please take all new medication as prescribed Please continue all other medications as before, and refills have been done if requested. Please have the pharmacy call with any other refills you may need.  Please remember to sign up for MyChart if you have not done so, as this will be important to you in the future with finding out test results, communicating by private email, and scheduling acute appointments online when needed.

## 2013-07-21 ENCOUNTER — Other Ambulatory Visit: Payer: Self-pay | Admitting: *Deleted

## 2013-07-21 MED ORDER — AMLODIPINE BESYLATE 10 MG PO TABS: 10.0000 mg | ORAL_TABLET | Freq: Every day | ORAL | Status: DC

## 2013-07-21 MED ORDER — ATORVASTATIN CALCIUM 10 MG PO TABS: 10.0000 mg | ORAL_TABLET | Freq: Every day | ORAL | Status: DC

## 2013-07-21 MED ORDER — BENAZEPRIL HCL 20 MG PO TABS: ORAL_TABLET | ORAL | Status: DC

## 2013-07-29 ENCOUNTER — Other Ambulatory Visit: Payer: Self-pay

## 2013-07-29 MED ORDER — ATORVASTATIN CALCIUM 10 MG PO TABS: 10.0000 mg | ORAL_TABLET | Freq: Every day | ORAL | Status: DC

## 2013-07-29 MED ORDER — AMLODIPINE BESYLATE 10 MG PO TABS: 10.0000 mg | ORAL_TABLET | Freq: Every day | ORAL | Status: DC

## 2013-07-29 MED ORDER — BENAZEPRIL HCL 20 MG PO TABS: ORAL_TABLET | ORAL | Status: DC

## 2013-08-04 ENCOUNTER — Encounter: Payer: Self-pay | Admitting: Internal Medicine

## 2013-08-04 ENCOUNTER — Ambulatory Visit (INDEPENDENT_AMBULATORY_CARE_PROVIDER_SITE_OTHER): Payer: Medicare Other | Admitting: Internal Medicine

## 2013-08-04 VITALS — BP 120/88 | HR 80 | Temp 98.4°F | Resp 16 | Wt 198.0 lb

## 2013-08-04 DIAGNOSIS — I10 Essential (primary) hypertension: Secondary | ICD-10-CM

## 2013-08-04 DIAGNOSIS — R05 Cough: Secondary | ICD-10-CM | POA: Insufficient documentation

## 2013-08-04 DIAGNOSIS — R059 Cough, unspecified: Secondary | ICD-10-CM

## 2013-08-04 MED ORDER — SOLIFENACIN SUCCINATE 5 MG PO TABS: 10.0000 mg | ORAL_TABLET | Freq: Every day | ORAL | Status: DC

## 2013-08-04 MED ORDER — LOSARTAN POTASSIUM 100 MG PO TABS: 100.0000 mg | ORAL_TABLET | Freq: Every day | ORAL | Status: DC

## 2013-08-04 MED ORDER — OMEPRAZOLE 20 MG PO CPDR: 20.0000 mg | DELAYED_RELEASE_CAPSULE | Freq: Every day | ORAL | Status: DC

## 2013-08-04 NOTE — Assessment & Plan Note (Signed)
Stop Benazepril due to cough. Start Losartan instead. Continue Amlodipine

## 2013-08-04 NOTE — Assessment & Plan Note (Signed)
3/15 Stop Benazepril due to cough. Start Losartan instead. CXR is ok

## 2013-08-04 NOTE — Progress Notes (Signed)
Pre visit review using our clinic review tool, if applicable. No additional management support is needed unless otherwise documented below in the visit note. 

## 2013-08-04 NOTE — Patient Instructions (Signed)
Stop Benazepril. Start Losartan instead. Continue Amlodipine

## 2013-09-25 DIAGNOSIS — R972 Elevated prostate specific antigen [PSA]: Secondary | ICD-10-CM | POA: Diagnosis not present

## 2013-10-01 ENCOUNTER — Ambulatory Visit (INDEPENDENT_AMBULATORY_CARE_PROVIDER_SITE_OTHER): Payer: Medicare Other | Admitting: Internal Medicine

## 2013-10-01 ENCOUNTER — Encounter: Payer: Self-pay | Admitting: Internal Medicine

## 2013-10-01 VITALS — BP 132/80 | HR 61 | Temp 98.2°F | Ht 67.0 in | Wt 195.8 lb

## 2013-10-01 DIAGNOSIS — R059 Cough, unspecified: Secondary | ICD-10-CM

## 2013-10-01 DIAGNOSIS — I10 Essential (primary) hypertension: Secondary | ICD-10-CM | POA: Diagnosis not present

## 2013-10-01 DIAGNOSIS — N318 Other neuromuscular dysfunction of bladder: Secondary | ICD-10-CM

## 2013-10-01 DIAGNOSIS — R079 Chest pain, unspecified: Secondary | ICD-10-CM | POA: Diagnosis not present

## 2013-10-01 DIAGNOSIS — R972 Elevated prostate specific antigen [PSA]: Secondary | ICD-10-CM | POA: Insufficient documentation

## 2013-10-01 DIAGNOSIS — K219 Gastro-esophageal reflux disease without esophagitis: Secondary | ICD-10-CM | POA: Diagnosis not present

## 2013-10-01 DIAGNOSIS — R05 Cough: Secondary | ICD-10-CM

## 2013-10-01 MED ORDER — BENAZEPRIL HCL 20 MG PO TABS: 20.0000 mg | ORAL_TABLET | Freq: Every day | ORAL | Status: DC

## 2013-10-01 MED ORDER — TOLTERODINE TARTRATE ER 4 MG PO CP24: 4.0000 mg | ORAL_CAPSULE | Freq: Every day | ORAL | Status: DC

## 2013-10-01 NOTE — Assessment & Plan Note (Addendum)
Atypical, ECG reviewed as per emr, for stress testing - r/o ischemia, o/w suspect GI related  Note:  Total time for pt hx, exam, review of record with pt in the room, determination of diagnoses and plan for further eval and tx is > 40 min, with over 50% spent in coordination and counseling of patient

## 2013-10-01 NOTE — Assessment & Plan Note (Signed)
For re-start protonix 40 qd,  to f/u any worsening symptoms or concerns

## 2013-10-01 NOTE — Progress Notes (Signed)
Subjective:    Patient ID: [name] [name], male    DOB: [birth year], 63 y.o.   MRN: [id number]  HPI    Here to f/u; Pt denies increased sob or doe, wheezing, orthopnea, PND, increased LE swelling, palpitations, dizziness or syncope, except for ant lower sternal area tightness, constant now, alka seltzer plus seemed to help sometimes. No n/v, diaphroesis.   Last cxr mar 2015 - NAD, ECG reviewed as per emr today. Tolerating losartan 100 mg well, BP ok but legs seem to swell more, and cough from mar 30 visit no change. Prefers to go back to benazepril 20 mg. Also needs change of vesicare as although helps and no side effect, can no longer afford this yr with current ins Past Medical History  Diagnosis Date  . GERD 07/31/2007  . HYPERLIPIDEMIA 01/04/2007  . HYPERTENSION 01/04/2007  . OVERACTIVE BLADDER 03/06/2007  . BENIGN PROSTATIC HYPERTROPHY 05/06/2010  . COMMON MIGRAINE 07/31/2007   No past surgical history on file.  reports that he has never smoked. He does not have any smokeless tobacco history on file. His alcohol and drug histories are not on file. family history is not on file. Allergies  Allergen Reactions  . Benazepril     cough  . Celecoxib Other (See Comments)    Stroke like symptoms  . Crestor [Rosuvastatin]     weakness   Current Outpatient Prescriptions on File Prior to Visit  Medication Sig Dispense Refill  . albuterol (PROVENTIL HFA;VENTOLIN HFA) 108 (90 BASE) MCG/ACT inhaler Inhale 2 puffs into the lungs every 6 (six) hours as needed for wheezing or shortness of breath.  1 Inhaler  2  . amLODipine (NORVASC) 10 MG tablet Take 1 tablet (10 mg total) by mouth daily.  90 tablet  3  . aspirin 81 MG tablet Take 81 mg by mouth daily.       Marland Kitchen atorvastatin (LIPITOR) 10 MG tablet Take 1 tablet (10 mg total) by mouth daily.  90 tablet  3  . omeprazole (PRILOSEC) 20 MG capsule Take 1 capsule (20 mg total) by mouth daily.  90 capsule  3  . polyethylene glycol (MIRALAX / GLYCOLAX)  packet Take 17 g by mouth as needed.       . Tamsulosin HCl (FLOMAX) 0.4 MG CAPS Take 1 capsule (0.4 mg total) by mouth daily.  30 capsule  11  . solifenacin (VESICARE) 5 MG tablet Take 2 tablets (10 mg total) by mouth daily.  90 tablet  3   No current facility-administered medications on file prior to visit.   Review of Systems Constitutional: Negative for increased diaphoresis, other activity, appetite or other siginficant weight change  HENT: Negative for worsening hearing loss, ear pain, facial swelling, mouth sores and neck stiffness.   Eyes: Negative for other worsening pain, redness or visual disturbance.  Respiratory: Negative for shortness of breath and wheezing.   Cardiovascular: Negative for chest pain and palpitations.  Gastrointestinal: Negative for diarrhea, blood in stool, abdominal distention or other pain Genitourinary: Negative for hematuria, flank pain or change in urine volume.  Musculoskeletal: Negative for myalgias or other joint complaints.  Skin: Negative for color change and wound.  Neurological: Negative for syncope and numbness. other than noted Hematological: Negative for adenopathy. or other swelling Psychiatric/Behavioral: Negative for hallucinations, self-injury, decreased concentration or other worsening agitation.      Objective:   Physical Exam BP 132/80  Pulse 61  Temp(Src) 98.2 F (36.8 C) (Oral)  Ht 5\' 7"  (1.702  m)  Wt 195 lb 12 oz (88.792 kg)  BMI 30.65 kg/m2  SpO2 96% VS noted,  Constitutional: Pt appears well-developed, well-nourished.  HENT: Head: NCAT.  Right Ear: External ear normal.  Left Ear: External ear normal.  Eyes: . Pupils are equal, round, and reactive to light. Conjunctivae and EOM are normal Neck: Normal range of motion. Neck supple.  Cardiovascular: Normal rate and regular rhythm.   Pulmonary/Chest: Effort normal and breath sounds normal.  Abd:  Soft, NT, ND, + BS Neurological: Pt is alert. Not confused , motor grossly  intact Skin: Skin is warm. No rash Psychiatric: Pt behavior is normal. No agitation.     Assessment & Plan:  ADD: at end visit, we had called [facility] was contated, PSA last wk was 4.57; I let pt know, and stated since this was over 4.0, he will likely hear further from Dr Gaynelle Arabian regarding the elevated PSA

## 2013-10-01 NOTE — Progress Notes (Signed)
Pre visit review using our clinic review tool, if applicable. No additional management support is needed unless otherwise documented below in the visit note. 

## 2013-10-01 NOTE — Assessment & Plan Note (Signed)
Ok to change losartan 100 back to benazepril 20, Please continue all other medications as before, cont to monitor BP at home and next visit BP Readings from Last 3 Encounters:  10/01/13 132/80  08/04/13 120/88  07/15/13 122/82

## 2013-10-01 NOTE — Assessment & Plan Note (Signed)
Ok for trial change from vesicare to [employer] LA

## 2013-10-01 NOTE — Assessment & Plan Note (Signed)
In retrospect ? Related to GERD - for restart PPI as above

## 2013-10-01 NOTE — Patient Instructions (Addendum)
Please take all new medication as prescribed - the protonix 40 mg per day (for reflux), detrol LA (for overactive bladder), and benazepril 20 mg per day  OK to stop the losartan 100 mg, as well as the vesicare  Please continue all other medications as before, and refills have been done if requested. Please have the pharmacy call with any other refills you may need.  You will be contacted regarding the referral for: stress test  Please keep your appointments with your specialists as you have planned- Dr Tannenbaum/urology  Please return in 6 months, or sooner if needed

## 2013-10-02 ENCOUNTER — Telehealth: Payer: Self-pay | Admitting: Internal Medicine

## 2013-10-02 NOTE — Telephone Encounter (Signed)
Relevant patient education assigned to patient using Emmi. ° °

## 2013-10-06 DIAGNOSIS — R972 Elevated prostate specific antigen [PSA]: Secondary | ICD-10-CM | POA: Diagnosis not present

## 2013-11-27 ENCOUNTER — Encounter: Payer: Self-pay | Admitting: Cardiology

## 2013-12-01 ENCOUNTER — Ambulatory Visit (HOSPITAL_COMMUNITY): Payer: Medicare Other | Attending: Cardiology | Admitting: Radiology

## 2013-12-01 VITALS — BP 118/73 | Ht 67.0 in | Wt 196.0 lb

## 2013-12-01 DIAGNOSIS — J4489 Other specified chronic obstructive pulmonary disease: Secondary | ICD-10-CM | POA: Insufficient documentation

## 2013-12-01 DIAGNOSIS — R079 Chest pain, unspecified: Secondary | ICD-10-CM | POA: Diagnosis not present

## 2013-12-01 DIAGNOSIS — I5189 Other ill-defined heart diseases: Secondary | ICD-10-CM | POA: Insufficient documentation

## 2013-12-01 DIAGNOSIS — J449 Chronic obstructive pulmonary disease, unspecified: Secondary | ICD-10-CM | POA: Insufficient documentation

## 2013-12-01 DIAGNOSIS — Z8249 Family history of ischemic heart disease and other diseases of the circulatory system: Secondary | ICD-10-CM | POA: Insufficient documentation

## 2013-12-01 DIAGNOSIS — R0602 Shortness of breath: Secondary | ICD-10-CM | POA: Diagnosis not present

## 2013-12-01 MED ORDER — REGADENOSON 0.4 MG/5ML IV SOLN
0.4000 mg | Freq: Once | INTRAVENOUS | Status: AC
  Administered 2013-12-01: 0.4 mg via INTRAVENOUS

## 2013-12-01 MED ORDER — TECHNETIUM TC 99M SESTAMIBI GENERIC - CARDIOLITE
10.0000 | Freq: Once | INTRAVENOUS | Status: AC | PRN
  Administered 2013-12-01: 10 via INTRAVENOUS

## 2013-12-01 MED ORDER — TECHNETIUM TC 99M SESTAMIBI GENERIC - CARDIOLITE
30.0000 | Freq: Once | INTRAVENOUS | Status: AC | PRN
  Administered 2013-12-01: 30 via INTRAVENOUS

## 2013-12-01 NOTE — Progress Notes (Signed)
[facility] [place], [place] [address] [phone]    Cardiology Nuclear Med Study  [name] [name] is a 63 y.o. male     MRN : [id number]     DOB: Mar 19, 1951  Procedure Date: 12/01/2013  Nuclear Med Background Indication for Stress Test:  Evaluation for Ischemia History:  COPD and No H/O CAD, CATH: 7/8 yrs ago NL per pt  Cardiac Risk Factors: Family History - CAD, Hypertension and Lipids  Symptoms:  Chest Pain and SOB   Nuclear Pre-Procedure Caffeine/Decaff Intake:  None NPO After: 9:30pm   Lungs:  clear O2 Sat: 98% on room air. IV 0.9% NS with Angio Cath:  20g  IV Site: R Hand  IV Started by:  Matilde Haymaker, RN  Chest Size (in):  44 Cup Size: n/a  Height: 5\' 7"  (1.702 m)  Weight:  196 lb (88.905 kg)  BMI:  Body mass index is 30.69 kg/(m^2). Tech Comments:  n/a    Nuclear Med Study 1 or 2 day study: 1 day  Stress Test Type:  Lexiscan  Reading MD: n/a  Order Authorizing Provider:  Waynard Edwards  Resting Radionuclide: Technetium 54m Sestamibi  Resting Radionuclide Dose: 11.0 mCi   Stress Radionuclide:  Technetium 83m Sestamibi  Stress Radionuclide Dose: 33.0 mCi           Stress Protocol Rest HR: 61 Stress HR: 76  Rest BP: 118/73 Stress BP: 119/75  Exercise Time (min): n/a METS: n/a   Predicted Max HR: 155 bpm % Max HR: 49.03 bpm Rate Pressure Product: 9044   Dose of Adenosine (mg):  n/a Dose of Lexiscan: 0.4 mg  Dose of Atropine (mg): n/a Dose of Dobutamine: n/a mcg/kg/min (at max HR)  Stress Test Technologist: Perrin Maltese, EMT-P  Nuclear Technologist:  Vedia Pereyra, CNMT     Rest Procedure:  Myocardial perfusion imaging was performed at rest 45 minutes following the intravenous administration of Technetium 91m Sestamibi. Rest ECG: NSR with septal infarct and nonspecific T wave abnormality in the inferolateral leads  Stress Procedure:  The patient received IV Lexiscan 0.4 mg over 15-seconds.  Technetium  77m Sestamibi injected at 30-seconds. This patient had chest tightness with the Lexiscan injection. Quantitative spect images were obtained after a 45 minute delay. Stress ECG: No significant change from baseline ECG  QPS Raw Data Images:  Mild diaphragmatic attenuation.  Normal left ventricular size. Stress Images:  Very small defect in the mid and basal inferior wall Rest Images:  Normal homogeneous uptake in all areas of the myocardium. Subtraction (SDS):  Very small defect in the mid and basal inferior wall that could be due to variations in diaphragmatic attenuation but cannot rule out a very small area of ischemia Transient Ischemic Dilatation (Normal <1.22):  1.08 Lung/Heart Ratio (Normal <0.45):  0.29  Quantitative Gated Spect Images QGS EDV:  136 ml QGS ESV:  58 ml  Impression Exercise Capacity:  Lexiscan with no exercise. BP Response:  Hypotensive blood pressure response. Clinical Symptoms:  Typical chest pain. ECG Impression:  No signifcant changes from the baseline T wave changes noted on rest EKG Comparison with Prior Nuclear Study: No previous nuclear study performed  Overall Impression:  Low risk stress nuclear study with a very small reversible defect in the mid and basal inferior wall which is most likely related to variations in diaphragmatic attenunation but cannot rule out a very small area of ischemia..  LV Ejection Fraction: 57%.  LV Wall  Motion:  NL LV Function; NL Wall Motion  Signed: Fransico Him, MD [facility]

## 2013-12-19 ENCOUNTER — Encounter: Payer: Self-pay | Admitting: Internal Medicine

## 2013-12-29 ENCOUNTER — Ambulatory Visit (INDEPENDENT_AMBULATORY_CARE_PROVIDER_SITE_OTHER): Payer: Medicare Other | Admitting: Family Medicine

## 2013-12-29 VITALS — BP 122/74 | HR 92 | Temp 98.0°F | Resp 18 | Wt 195.0 lb

## 2013-12-29 DIAGNOSIS — M79609 Pain in unspecified limb: Secondary | ICD-10-CM | POA: Diagnosis not present

## 2013-12-29 DIAGNOSIS — M79645 Pain in left finger(s): Secondary | ICD-10-CM

## 2013-12-29 DIAGNOSIS — S61209A Unspecified open wound of unspecified finger without damage to nail, initial encounter: Secondary | ICD-10-CM

## 2013-12-29 MED ORDER — CEPHALEXIN 500 MG PO CAPS: 500.0000 mg | ORAL_CAPSULE | Freq: Two times a day (BID) | ORAL | Status: DC

## 2013-12-29 NOTE — Progress Notes (Signed)
This 63 year old gentleman who suffered a fishhook in his left volar ring finger at the distal phalanx. He clipped off the shank of the fishhook and the barb is buried in the phalanx.  Patient's is up-to-date on his tetanus shot, having had a tetanus shot within 10 years.  Objective: Patient has an obvious fishhook in the pulp of his distal phalanx on the left ring finger.  Area was cleansed with Betadine, anesthetized with 1% Xylocaine plain, and then removed uneventfully. A dressing was then applied as hemostasis was established.  Assessment: Foreign body, left ring finger, distal phalanx  Plan: Keflex 500 twice a day x5 days Return if signs of infection  Signed, Robyn Haber, MD

## 2014-02-23 ENCOUNTER — Telehealth: Payer: Self-pay | Admitting: Internal Medicine

## 2014-02-23 NOTE — Telephone Encounter (Signed)
Pt came by office requesting to speak to someone about a medication TOLTERODINE he was prescribed during his last visit with Dr Jenny Reichmann. Pt states he is experiencing symptoms and would like an alternative if possible. Please review this request and contact pt to advise.

## 2014-02-23 NOTE — Telephone Encounter (Signed)
Robin to contact pt regardin ? Side effect detrol ?

## 2014-02-24 MED ORDER — OXYBUTYNIN CHLORIDE ER 10 MG PO TB24: 10.0000 mg | ORAL_TABLET | Freq: Every day | ORAL | Status: DC

## 2014-02-24 NOTE — Telephone Encounter (Signed)
Ok fo change from detrol to oxybutinin asd,  - done erx

## 2014-02-24 NOTE — Telephone Encounter (Signed)
Called pt he stated whenever he started taking the detrol he started having real bad headaches, and plus his copay was to high a 30 day cost him $130. A nurse recommended oxybutynin she stated it works the same. If ok with md would like to try...[name] [name]

## 2014-02-24 NOTE — Telephone Encounter (Signed)
Notified pt with md response. Pt is wanting med sent to [facility]. Inform him will resend...[name] [name]

## 2014-02-27 ENCOUNTER — Ambulatory Visit (INDEPENDENT_AMBULATORY_CARE_PROVIDER_SITE_OTHER): Payer: Medicare Other | Admitting: *Deleted

## 2014-02-27 DIAGNOSIS — Z23 Encounter for immunization: Secondary | ICD-10-CM

## 2014-03-02 ENCOUNTER — Telehealth: Payer: Self-pay | Admitting: Internal Medicine

## 2014-03-02 NOTE — Telephone Encounter (Signed)
Pt came by office requesting to speak to Shirlean Mylar about Rx that had been sent in recently. Pt has specific questions and concerns. Please contatc pt when request is reviewed.  Name of med is: OXYBUTYNIN

## 2014-03-03 MED ORDER — OXYBUTYNIN CHLORIDE 5 MG PO TABS: 5.0000 mg | ORAL_TABLET | Freq: Two times a day (BID) | ORAL | Status: DC

## 2014-03-03 NOTE — Telephone Encounter (Signed)
Change to oxybutinin 5 bid done erx

## 2014-03-03 NOTE — Telephone Encounter (Signed)
Called the patient and his pharmacy stated the Oxybutynin 24 hour is too expensive and could you change to the regular, send to mail order

## 2014-03-27 DIAGNOSIS — N401 Enlarged prostate with lower urinary tract symptoms: Secondary | ICD-10-CM | POA: Diagnosis not present

## 2014-03-27 DIAGNOSIS — E559 Vitamin D deficiency, unspecified: Secondary | ICD-10-CM | POA: Diagnosis not present

## 2014-04-01 DIAGNOSIS — R972 Elevated prostate specific antigen [PSA]: Secondary | ICD-10-CM | POA: Diagnosis not present

## 2014-04-01 DIAGNOSIS — R351 Nocturia: Secondary | ICD-10-CM | POA: Diagnosis not present

## 2014-04-01 DIAGNOSIS — N401 Enlarged prostate with lower urinary tract symptoms: Secondary | ICD-10-CM | POA: Diagnosis not present

## 2014-04-03 ENCOUNTER — Ambulatory Visit: Payer: Medicare Other | Admitting: Internal Medicine

## 2014-04-08 ENCOUNTER — Encounter: Payer: Self-pay | Admitting: Internal Medicine

## 2014-04-08 ENCOUNTER — Other Ambulatory Visit (INDEPENDENT_AMBULATORY_CARE_PROVIDER_SITE_OTHER): Payer: Medicare Other

## 2014-04-08 ENCOUNTER — Ambulatory Visit (INDEPENDENT_AMBULATORY_CARE_PROVIDER_SITE_OTHER): Payer: Medicare Other | Admitting: Internal Medicine

## 2014-04-08 VITALS — BP 110/68 | HR 73 | Temp 98.5°F | Ht 67.0 in | Wt 198.0 lb

## 2014-04-08 DIAGNOSIS — R059 Cough, unspecified: Secondary | ICD-10-CM

## 2014-04-08 DIAGNOSIS — I10 Essential (primary) hypertension: Secondary | ICD-10-CM

## 2014-04-08 DIAGNOSIS — R972 Elevated prostate specific antigen [PSA]: Secondary | ICD-10-CM | POA: Diagnosis not present

## 2014-04-08 DIAGNOSIS — Z23 Encounter for immunization: Secondary | ICD-10-CM | POA: Diagnosis not present

## 2014-04-08 DIAGNOSIS — E785 Hyperlipidemia, unspecified: Secondary | ICD-10-CM

## 2014-04-08 DIAGNOSIS — N318 Other neuromuscular dysfunction of bladder: Secondary | ICD-10-CM | POA: Diagnosis not present

## 2014-04-08 DIAGNOSIS — R05 Cough: Secondary | ICD-10-CM | POA: Diagnosis not present

## 2014-04-08 LAB — HEPATIC FUNCTION PANEL
ALT: 28 U/L (ref 0–53)
AST: 26 U/L (ref 0–37)
Albumin: 4.4 g/dL (ref 3.5–5.2)
Alkaline Phosphatase: 68 U/L (ref 39–117)
BILIRUBIN TOTAL: 1 mg/dL (ref 0.2–1.2)
Bilirubin, Direct: 0.1 mg/dL (ref 0.0–0.3)
Total Protein: 7.7 g/dL (ref 6.0–8.3)

## 2014-04-08 LAB — BASIC METABOLIC PANEL
BUN: 13 mg/dL (ref 6–23)
CALCIUM: 9.3 mg/dL (ref 8.4–10.5)
CO2: 27 mEq/L (ref 19–32)
Chloride: 101 mEq/L (ref 96–112)
Creatinine, Ser: 1 mg/dL (ref 0.4–1.5)
GFR: 97.3 mL/min (ref >60.00)
GLUCOSE: 99 mg/dL (ref 70–99)
POTASSIUM: 3.6 meq/L (ref 3.5–5.1)
Sodium: 135 mEq/L (ref 135–145)

## 2014-04-08 LAB — LIPID PANEL
CHOLESTEROL: 167 mg/dL (ref 0–200)
HDL: 54.7 mg/dL (ref >39.00)
LDL Cholesterol: 102 mg/dL — ABNORMAL HIGH (ref 0–99)
NonHDL: 112.3
Total CHOL/HDL Ratio: 3
Triglycerides: 52 mg/dL (ref 0.0–149.0)
VLDL: 10.4 mg/dL (ref 0.0–40.0)

## 2014-04-08 LAB — CBC WITH DIFFERENTIAL/PLATELET
Basophils Absolute: 0 10*3/uL (ref 0.0–0.1)
Basophils Relative: 0.3 % (ref 0.0–3.0)
EOS PCT: 0.7 % (ref 0.0–5.0)
Eosinophils Absolute: 0.1 10*3/uL (ref 0.0–0.7)
HEMATOCRIT: 45.9 % (ref 39.0–52.0)
Hemoglobin: 15.2 g/dL (ref 13.0–17.0)
Lymphocytes Relative: 24.3 % (ref 12.0–46.0)
Lymphs Abs: 2 10*3/uL (ref 0.7–4.0)
MCHC: 33.2 g/dL (ref 30.0–36.0)
MCV: 91.9 fl (ref 78.0–100.0)
Monocytes Absolute: 0.6 10*3/uL (ref 0.1–1.0)
Monocytes Relative: 7.5 % (ref 3.0–12.0)
NEUTROS ABS: 5.6 10*3/uL (ref 1.4–7.7)
Neutrophils Relative %: 67.2 % (ref 43.0–77.0)
Platelets: 318 10*3/uL (ref 150.0–400.0)
RBC: 5 Mil/uL (ref 4.22–5.81)
RDW: 14.1 % (ref 11.5–15.5)
WBC: 8.4 10*3/uL (ref 4.0–10.5)

## 2014-04-08 LAB — URINALYSIS, ROUTINE W REFLEX MICROSCOPIC
BILIRUBIN URINE: NEGATIVE
KETONES UR: NEGATIVE
Leukocytes, UA: NEGATIVE
Nitrite: NEGATIVE
Specific Gravity, Urine: 1.015 (ref 1.000–1.030)
Total Protein, Urine: NEGATIVE
Urine Glucose: NEGATIVE
Urobilinogen, UA: 0.2 (ref 0.0–1.0)
pH: 6.5 (ref 5.0–8.0)

## 2014-04-08 LAB — PSA: PSA: 4.53 ng/mL — AB (ref 0.10–4.00)

## 2014-04-08 LAB — TSH: TSH: 1.54 u[IU]/mL (ref 0.35–4.50)

## 2014-04-08 NOTE — Addendum Note (Signed)
Addended by: Sharon Seller B on: 04/08/2014 08:58 AM   Modules accepted: Orders

## 2014-04-08 NOTE — Assessment & Plan Note (Signed)
stable overall by history and exam, recent data reviewed with pt, and pt to continue medical treatment as before,  to f/u any worsening symptoms or concerns Lab Results  Component Value Date   LDLCALC 108* 04/16/2013   For f/u labs

## 2014-04-08 NOTE — Assessment & Plan Note (Signed)
stable overall by history and exam, recent data reviewed with pt, and pt to continue medical treatment as before,  to f/u any worsening symptoms or concerns BP Readings from Last 3 Encounters:  04/08/14 110/68  12/29/13 122/74  12/01/13 118/73

## 2014-04-08 NOTE — Progress Notes (Signed)
Pre visit review using our clinic review tool, if applicable. No additional management support is needed unless otherwise documented below in the visit note. 

## 2014-04-08 NOTE — Assessment & Plan Note (Signed)
For f/u psa today,  to f/u any worsening symptoms or concerns 

## 2014-04-08 NOTE — Patient Instructions (Addendum)
You had the new Prevnar pneumonia shot today  Ok to stop the oxybutinin as you have done  Please ask your pharmacist for a similar medication such as Detrol LA, or Vesicare or similar is covered by your insurance  Please continue all other medications as before, and refills have been done if requested.  Please have the pharmacy call with any other refills you may need.  Please continue your efforts at being more active, low cholesterol diet, and weight control.  You are otherwise up to date with prevention measures today.  Please keep your appointments with your specialists as you may have planned  Please go to the LAB in the Basement (turn left off the elevator) for the tests to be done today  You will be contacted by phone if any changes need to be made immediately.  Otherwise, you will receive a letter about your results with an explanation, but please check with MyChart first.  Please remember to sign up for MyChart if you have not done so, as this will be important to you in the future with finding out test results, communicating by private email, and scheduling acute appointments online when needed.  Please return in 6 months, or sooner if needed  OK TO CANCEL the May 26, 2014 appt

## 2014-04-08 NOTE — Assessment & Plan Note (Signed)
Pt to inquire at pharmacy regarding other OAB med covered by his insurance and  Let us know

## 2014-04-08 NOTE — Assessment & Plan Note (Signed)
Minor to pt, adamant he does not feel related to his ACE and wants to cont acei due to good BP control, last cxr mar 2015 ok, declinse furhter evaluation

## 2014-04-08 NOTE — Progress Notes (Signed)
Subjective:    Patient ID: [name] [name], male    DOB: [birth year], 63 y.o.   MRN: [id number]  HPI  Here for yearly f/u;  Overall doing ok;  Pt denies CP, worsening SOB, DOE, wheezing, orthopnea, PND, worsening LE edema, palpitations, dizziness or syncope, except for sharp tender lower xyphoid area occasionally. Does have an unusual scant prod cough in the past month wihtout overt wheezing or allergy symtpoms,  Never smoker.  Pt denies neurological change such as new headache, facial or extremity weakness.  Pt denies polydipsia, polyuria, or low sugar symptoms. Pt states overall good compliance with treatment and medications, good tolerability, and has been trying to follow lower cholesterol diet.  Pt denies worsening depressive symptoms, suicidal ideation or panic. No fever, night sweats, wt loss, loss of appetite, or other constitutional symptoms.  Pt states good ability with ADL's, has low fall risk, home safety reviewed and adequate, no other significant changes in hearing or vision, and only occasionally active with exercise. Tolerateing the ACE inhibito o/w ok.  protonix prn helps with reflux. Denies worsening reflux, abd pain, dysphagia, n/v, bowel change or blood. Could not take the oxybutinin due to dry mouth, still has urinary ugency and freq  Saw urology may 2015 - psa felt to be overall stable.  Past Medical History  Diagnosis Date  . GERD 07/31/2007  . HYPERLIPIDEMIA 01/04/2007  . HYPERTENSION 01/04/2007  . OVERACTIVE BLADDER 03/06/2007  . BENIGN PROSTATIC HYPERTROPHY 05/06/2010  . COMMON MIGRAINE 07/31/2007   No past surgical history on file.  reports that he has never smoked. He does not have any smokeless tobacco history on file. His alcohol and drug histories are not on file. family history is not on file. Allergies  Allergen Reactions  . Celecoxib Other (See Comments)    Stroke like symptoms  . Crestor [Rosuvastatin]     weakness  . Detrol [Tolterodine] Other (See Comments)   headache   Current Outpatient Prescriptions on File Prior to Visit  Medication Sig Dispense Refill  . aspirin 81 MG tablet Take 81 mg by mouth daily.     Marland Kitchen atorvastatin (LIPITOR) 10 MG tablet Take 1 tablet (10 mg total) by mouth daily. 90 tablet 3  . benazepril (LOTENSIN) 20 MG tablet Take 1 tablet (20 mg total) by mouth daily. 90 tablet 3  . oxybutynin (DITROPAN) 5 MG tablet Take 1 tablet (5 mg total) by mouth 2 (two) times daily. 180 tablet 3  . polyethylene glycol (MIRALAX / GLYCOLAX) packet Take 17 g by mouth as needed.     . Tamsulosin HCl (FLOMAX) 0.4 MG CAPS Take 1 capsule (0.4 mg total) by mouth daily. 30 capsule 11   No current facility-administered medications on file prior to visit.    Review of Systems Constitutional: Negative for increased diaphoresis, other activity, appetite or other siginficant weight change  HENT: Negative for worsening hearing loss, ear pain, facial swelling, mouth sores and neck stiffness.   Eyes: Negative for other worsening pain, redness or visual disturbance.  Respiratory: Negative for shortness of breath and wheezing.   Cardiovascular: Negative for chest pain and palpitations.  Gastrointestinal: Negative for diarrhea, blood in stool, abdominal distention or other pain Genitourinary: Negative for hematuria, flank pain or change in urine volume.  Musculoskeletal: Negative for myalgias or other joint complaints.  Skin: Negative for color change and wound.  Neurological: Negative for syncope and numbness. other than noted Hematological: Negative for adenopathy. or other swelling Psychiatric/Behavioral: Negative for hallucinations, self-injury,  decreased concentration or other worsening agitation.      Objective:   Physical Exam BP 110/68 mmHg  Pulse 73  Temp(Src) 98.5 F (36.9 C) (Oral)  Ht 5\' 7"  (1.702 m)  Wt 198 lb (89.812 kg)  BMI 31.00 kg/m2  SpO2 97% VS noted,  Constitutional: Pt is oriented to person, place, and time. Appears  well-developed and well-nourished.  Head: Normocephalic and atraumatic.  Right Ear: External ear normal.  Left Ear: External ear normal.  Nose: Nose normal.  Mouth/Throat: Oropharynx is clear and moist.  Eyes: Conjunctivae and EOM are normal. Pupils are equal, round, and reactive to light.  Neck: Normal range of motion. Neck supple. No JVD present. No tracheal deviation present.  Cardiovascular: Normal rate, regular rhythm, normal heart sounds and intact distal pulses.   Pulmonary/Chest: Effort normal and breath sounds without rales or wheezing  Abdominal: Soft. Bowel sounds are normal. NT. No HSM  Musculoskeletal: Normal range of motion. Exhibits no edema.  Lymphadenopathy:  Has no cervical adenopathy.  Neurological: Pt is alert and oriented to person, place, and time. Pt has normal reflexes. No cranial nerve deficit. Motor grossly intact Skin: Skin is warm and dry. No rash noted.  Psychiatric:  Has normal mood and affect. Behavior is normal.     Assessment & Plan:

## 2014-05-26 ENCOUNTER — Encounter: Payer: No Typology Code available for payment source | Admitting: Internal Medicine

## 2014-07-27 ENCOUNTER — Other Ambulatory Visit: Payer: Self-pay | Admitting: Internal Medicine

## 2014-08-29 ENCOUNTER — Ambulatory Visit (INDEPENDENT_AMBULATORY_CARE_PROVIDER_SITE_OTHER): Payer: Medicare Other

## 2014-08-29 ENCOUNTER — Ambulatory Visit (INDEPENDENT_AMBULATORY_CARE_PROVIDER_SITE_OTHER): Payer: Medicare Other | Admitting: Family Medicine

## 2014-08-29 VITALS — BP 126/80 | HR 86 | Temp 97.9°F | Resp 16 | Ht 68.0 in | Wt 194.0 lb

## 2014-08-29 DIAGNOSIS — R059 Cough, unspecified: Secondary | ICD-10-CM

## 2014-08-29 DIAGNOSIS — J209 Acute bronchitis, unspecified: Secondary | ICD-10-CM

## 2014-08-29 DIAGNOSIS — R05 Cough: Secondary | ICD-10-CM | POA: Diagnosis not present

## 2014-08-29 MED ORDER — PREDNISONE 20 MG PO TABS: ORAL_TABLET | ORAL | Status: DC

## 2014-08-29 MED ORDER — AZITHROMYCIN 500 MG PO TABS: 500.0000 mg | ORAL_TABLET | Freq: Every day | ORAL | Status: DC

## 2014-08-29 MED ORDER — HYDROCODONE-HOMATROPINE 5-1.5 MG/5ML PO SYRP: 5.0000 mL | ORAL_SOLUTION | Freq: Three times a day (TID) | ORAL | Status: DC | PRN

## 2014-08-29 NOTE — Patient Instructions (Signed)

## 2014-08-29 NOTE — Progress Notes (Signed)
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 He has no history of asthma.  Patient states that the cough is productive although there is no fever.  Objective: Adult male in no acute distress Congested cough HEENT: Unremarkable Chest: Very congested with decreased breath sounds in the left and rhonchi as well as rales on the left. He has bilateral extra wheezes Heart: Regular no murmur Extremity: No edema Neck: Supple, no JVD  [facility] reading (PRIMARY) by  Dr. Joseph Art.  Heavy markings without definite infiltrate    This chart was scribed in my presence and reviewed by me personally.    ICD-9-CM ICD-10-CM   1. Acute bronchitis, unspecified organism 466.0 J20.9 azithromycin (ZITHROMAX) 500 MG tablet     predniSONE (DELTASONE) 20 MG tablet     HYDROcodone-homatropine (HYCODAN) 5-1.5 MG/5ML syrup  2. Cough 786.2 R05 DG Chest 2 View     DG Chest 2 View     Signed, Robyn Haber, MD

## 2014-08-30 ENCOUNTER — Telehealth: Payer: Self-pay

## 2014-08-30 NOTE — Telephone Encounter (Signed)
PATIENT STATES HE WAS IN THE OFFICE ON Saturday TO SEE DR. Joseph Art FOR BRONCHITIS. HE WAS PRESCRIBED PREDNISONE 20 MG AND AZITHROMYCIN 500 MG. DR. Joseph Art TOLD HIM TO CALL BACK IF HE HAD ANY PROBLEMS WITH THE MEDICATION. THE ONLY THING THAT HE HAS NOTICED IS THAT HE WAS NOT ABLE TO GET ANY SLEEP LAST NIGHT. HE IS DUE TO TAKE HIS NEXT DOSES AT 12:30 TODAY. HE WOULD LIKE TO KNOW IF HE SHOULD GO AHEAD AND TAKE IT? BEST PHONE [phone] (CELL) PHARMACY CHOICE IS [facility] ON [address] OFF [address].  [place]

## 2014-09-01 NOTE — Telephone Encounter (Signed)
Called pt. lmom that prednisone is probably what is keeping him up at night. Told him to take it earlier in the day if possible. Told him this is unfortunately what prednisone does and that it will go away when rx is complete. Lm to cb if any more ?'s.

## 2014-09-01 NOTE — Telephone Encounter (Signed)
Please note:  This is my day off.

## 2014-10-13 ENCOUNTER — Other Ambulatory Visit (INDEPENDENT_AMBULATORY_CARE_PROVIDER_SITE_OTHER): Payer: Medicare Other

## 2014-10-13 ENCOUNTER — Telehealth: Payer: Self-pay

## 2014-10-13 ENCOUNTER — Encounter: Payer: Self-pay | Admitting: Internal Medicine

## 2014-10-13 ENCOUNTER — Ambulatory Visit (INDEPENDENT_AMBULATORY_CARE_PROVIDER_SITE_OTHER): Payer: Medicare Other | Admitting: Internal Medicine

## 2014-10-13 ENCOUNTER — Other Ambulatory Visit: Payer: Self-pay | Admitting: Internal Medicine

## 2014-10-13 VITALS — BP 132/82 | HR 72 | Temp 98.7°F | Wt 196.0 lb

## 2014-10-13 DIAGNOSIS — E785 Hyperlipidemia, unspecified: Secondary | ICD-10-CM | POA: Diagnosis not present

## 2014-10-13 DIAGNOSIS — R972 Elevated prostate specific antigen [PSA]: Secondary | ICD-10-CM

## 2014-10-13 DIAGNOSIS — I10 Essential (primary) hypertension: Secondary | ICD-10-CM | POA: Diagnosis not present

## 2014-10-13 DIAGNOSIS — N318 Other neuromuscular dysfunction of bladder: Secondary | ICD-10-CM | POA: Diagnosis not present

## 2014-10-13 LAB — PSA: PSA: 5.57 ng/mL — AB (ref 0.10–4.00)

## 2014-10-13 MED ORDER — FESOTERODINE FUMARATE ER 4 MG PO TB24: 4.0000 mg | ORAL_TABLET | Freq: Every day | ORAL | Status: DC

## 2014-10-13 MED ORDER — SOLIFENACIN SUCCINATE 5 MG PO TABS: 5.0000 mg | ORAL_TABLET | Freq: Every day | ORAL | Status: DC

## 2014-10-13 NOTE — Progress Notes (Signed)
Pre visit review using our clinic review tool, if applicable. No additional management support is needed unless otherwise documented below in the visit note. 

## 2014-10-13 NOTE — Patient Instructions (Signed)
Please take all new medication as prescribed - the vesicare  Please have the phamacy let us know if this is not covered by your pharmacy  Please continue all other medications as before, and refills have been done if requested.  Please have the pharmacy call with any other refills you may need.  Please continue your efforts at being more active, low cholesterol diet, and weight control.  Please keep your appointments with your specialists as you may have planned  - you are due for Urology follow up in November 2016  Please go to the LAB in the Basement (turn left off the elevator) for the tests to be done today - just the PSA today  Please return in 6 months, or sooner if needed

## 2014-10-13 NOTE — Telephone Encounter (Signed)
Insurance company states Vesicare is not covered under pt's plan. Insurance suggest Lisbeth Ply, please advise

## 2014-10-13 NOTE — Telephone Encounter (Signed)
Or try change to Lisbeth Ply - done erx

## 2014-10-13 NOTE — Assessment & Plan Note (Signed)
Ok to try change ditropan to vesicare if ok with insurance due to side effect dry mouth,  to f/u any worsening symptoms or concerns

## 2014-10-13 NOTE — Assessment & Plan Note (Signed)
I am concerned his PSA is increasing, and would like f/u psa today instead of one yr as planned; will ask for PSA today Lab Results  Component Value Date   PSA 4.53* 04/08/2014   PSA 3.11 04/16/2013   PSA 2.81 05/07/2012

## 2014-10-13 NOTE — Assessment & Plan Note (Signed)
stable overall by history and exam, recent data reviewed with pt, and pt to continue medical treatment as before,  to f/u any worsening symptoms or concerns BP Readings from Last 3 Encounters:  10/13/14 132/82  08/29/14 126/80  04/08/14 110/68

## 2014-10-13 NOTE — Progress Notes (Signed)
Subjective:    Patient ID: [name] [name], male    DOB: [birth year], 64 y.o.   MRN: [id number]  HPI Here to f/u; overall doing ok,  Pt denies chest pain, increasing sob or doe, wheezing, orthopnea, PND, increased LE swelling, palpitations, dizziness or syncope.  Pt denies new neurological symptoms such as new headache, or facial or extremity weakness or numbness.  Pt denies polydipsia, polyuria, or low sugar episode.   Pt denies new neurological symptoms such as new headache, or facial or extremity weakness or numbness.   Pt states overall good compliance with meds.  Last seen per urology nov 2015 for BPH and PSA increasing, felt ok to f/u at one yr.Last PSA there still < 4, but here was higher at: Lab Results  Component Value Date   PSA 4.53* 04/08/2014   PSA 3.11 04/16/2013   PSA 2.81 05/07/2012  Could not tolerate ditropan due to dry mouth, ok to try another as pumpkin seed oil not really helping. Still with nocturia 3-4 times per night Past Medical History  Diagnosis Date  . GERD 07/31/2007  . HYPERLIPIDEMIA 01/04/2007  . HYPERTENSION 01/04/2007  . OVERACTIVE BLADDER 03/06/2007  . BENIGN PROSTATIC HYPERTROPHY 05/06/2010  . COMMON MIGRAINE 07/31/2007   No past surgical history on file.  reports that he has never smoked. He does not have any smokeless tobacco history on file. His alcohol and drug histories are not on file. family history is not on file. Allergies  Allergen Reactions  . Celecoxib Other (See Comments)    Stroke like symptoms  . Crestor [Rosuvastatin]     weakness  . Detrol [Tolterodine] Other (See Comments)    headache   Current Outpatient Prescriptions on File Prior to Visit  Medication Sig Dispense Refill  . amLODipine (NORVASC) 10 MG tablet TAKE 1 TABLET EVERY DAY 90 tablet 3  . aspirin 81 MG tablet Take 81 mg by mouth daily.     Marland Kitchen atorvastatin (LIPITOR) 10 MG tablet TAKE 1 TABLET  DAILY 90 tablet 3  . benazepril (LOTENSIN) 20 MG tablet TAKE 1 TABLET EVERY DAY 90  tablet 3  . Tamsulosin HCl (FLOMAX) 0.4 MG CAPS Take 1 capsule (0.4 mg total) by mouth daily. 30 capsule 11  . polyethylene glycol (MIRALAX / GLYCOLAX) packet Take 17 g by mouth as needed.      No current facility-administered medications on file prior to visit.     Review of Systems  Constitutional: Negative for unusual diaphoresis or night sweats HENT: Negative for ringing in ear or discharge Eyes: Negative for double vision or worsening visual disturbance.  Respiratory: Negative for choking and stridor.   Gastrointestinal: Negative for vomiting or other signifcant bowel change Genitourinary: Negative for hematuria or change in urine volume.  Musculoskeletal: Negative for other MSK pain or swelling Skin: Negative for color change and worsening wound.  Neurological: Negative for tremors and numbness other than noted  Psychiatric/Behavioral: Negative for decreased concentration or agitation other than above       Objective:   Physical Exam BP 132/82 mmHg  Pulse 72  Temp(Src) 98.7 F (37.1 C) (Oral)  Wt 196 lb (88.905 kg)  SpO2 97% VS noted,  Constitutional: Pt appears in no significant distress HENT: Head: NCAT.  Right Ear: External ear normal.  Left Ear: External ear normal.  Eyes: . Pupils are equal, round, and reactive to light. Conjunctivae and EOM are normal Neck: Normal range of motion. Neck supple.  Cardiovascular: Normal rate and regular rhythm.  Pulmonary/Chest: Effort normal and breath sounds without rales or wheezing.  Abd:  Soft, NT, ND, + BS Neurological: Pt is alert. Not confused , motor grossly intact Skin: Skin is warm. No rash, no LE edema Psychiatric: Pt behavior is normal. No agitation.     Assessment & Plan:

## 2014-10-13 NOTE — Assessment & Plan Note (Signed)
stable overall by history and exam, recent data reviewed with pt, and pt to continue medical treatment as before,  to f/u any worsening symptoms or concerns Lab Results  Component Value Date   LDLCALC 102* 04/08/2014

## 2014-10-15 ENCOUNTER — Encounter: Payer: Self-pay | Admitting: Internal Medicine

## 2014-12-29 DIAGNOSIS — N138 Other obstructive and reflux uropathy: Secondary | ICD-10-CM | POA: Diagnosis not present

## 2014-12-29 DIAGNOSIS — R351 Nocturia: Secondary | ICD-10-CM | POA: Diagnosis not present

## 2014-12-29 DIAGNOSIS — N401 Enlarged prostate with lower urinary tract symptoms: Secondary | ICD-10-CM | POA: Diagnosis not present

## 2014-12-29 DIAGNOSIS — R312 Other microscopic hematuria: Secondary | ICD-10-CM | POA: Diagnosis not present

## 2014-12-29 DIAGNOSIS — R972 Elevated prostate specific antigen [PSA]: Secondary | ICD-10-CM | POA: Diagnosis not present

## 2015-02-04 DIAGNOSIS — R972 Elevated prostate specific antigen [PSA]: Secondary | ICD-10-CM | POA: Diagnosis not present

## 2015-02-04 DIAGNOSIS — C61 Malignant neoplasm of prostate: Secondary | ICD-10-CM | POA: Diagnosis not present

## 2015-02-04 HISTORY — DX: Malignant neoplasm of prostate: C61

## 2015-02-18 DIAGNOSIS — C61 Malignant neoplasm of prostate: Secondary | ICD-10-CM | POA: Diagnosis not present

## 2015-02-22 ENCOUNTER — Ambulatory Visit: Payer: Medicare Other | Admitting: Radiation Oncology

## 2015-02-22 ENCOUNTER — Encounter: Payer: Self-pay | Admitting: Radiation Oncology

## 2015-02-22 NOTE — Progress Notes (Addendum)
GU Location of Tumor / Histology: Adenocarcinoma of the Prostate  If Prostate Cancer, Gleason Score is (4 + 3) and PSA is (5.57)  [name] [name] was referred to Dr.Tannenbaum on 10/29/14 for an elevated PSA of 5.57  Biopsies of Prostate  02/04/15 Adenocarcinoma of the Prostate  Right Apex: Prostatic Adenocarcinoma 3+4 Involves 10% of one core (Pattern 4=10)  Right Base Lateral Prostatic Adenocarcinoma, Gleason Score 3+3=6. Involves 5% of one core  Right Mid Lateral- Prostatic Adenocarcinoma, Gleason Score 4+3=7 INvolves 30% of one Core (pattern 4=60%)  Right Apex Lateral - Prostatic Adenocarcinoma, Gleason Score 4+3 = 7, Involves 20% of one core(Pattern 4= 80%)  Past/Anticipated interventions by urology, if any: Biospy of the Prostate  Past/Anticipated interventions by medical oncology, if any: Unknown  Weight changes, if any: NO Bladder/bowel c/o: frequency, urgency, nocturia 3-4x,  Regular bowel movements, no hematuria,   Nausea/Vomiting, if any: NO  Pain issues, if any:  NO  SAFETY ISSUES: NO  Prior radiation? NO  Pacemaker/ICD: NO  Is the patient on methotrexate? NO  Current Complaints / other details:  Married, Company secretary, 3 children,  Non smoker, no alcohol or ilicit drug use, Father Lung cancer Allergies:Celebrex, Ditropan, Detrol, Crestor intolerance I-PSS score=13  BP 143/83 mmHg  Pulse 65  Temp(Src) 98 F (36.7 C) (Oral)  Resp 20  Ht 5\' 7"  (1.702 m)  Wt 195 lb 8 oz (88.678 kg)  BMI 30.61 kg/m2  Wt Readings from Last 3 Encounters:  02/23/15 195 lb 8 oz (88.678 kg)  10/13/14 196 lb (88.905 kg)  08/29/14 194 lb (87.998 kg)

## 2015-02-23 ENCOUNTER — Ambulatory Visit
Admission: RE | Admit: 2015-02-23 | Discharge: 2015-02-23 | Disposition: A | Discharge status: Home / Self Care | Payer: Medicare Other | Source: Ambulatory Visit | Attending: Radiation Oncology | Admitting: Radiation Oncology

## 2015-02-23 ENCOUNTER — Ambulatory Visit (INDEPENDENT_AMBULATORY_CARE_PROVIDER_SITE_OTHER): Payer: Medicare Other | Admitting: *Deleted

## 2015-02-23 ENCOUNTER — Encounter: Payer: Self-pay | Admitting: Radiation Oncology

## 2015-02-23 VITALS — BP 143/83 | HR 65 | Temp 98.0°F | Resp 20 | Ht 67.0 in | Wt 195.5 lb

## 2015-02-23 DIAGNOSIS — N3281 Overactive bladder: Secondary | ICD-10-CM | POA: Insufficient documentation

## 2015-02-23 DIAGNOSIS — F1010 Alcohol abuse, uncomplicated: Secondary | ICD-10-CM | POA: Diagnosis not present

## 2015-02-23 DIAGNOSIS — E785 Hyperlipidemia, unspecified: Secondary | ICD-10-CM | POA: Insufficient documentation

## 2015-02-23 DIAGNOSIS — C61 Malignant neoplasm of prostate: Secondary | ICD-10-CM | POA: Insufficient documentation

## 2015-02-23 DIAGNOSIS — I10 Essential (primary) hypertension: Secondary | ICD-10-CM | POA: Insufficient documentation

## 2015-02-23 DIAGNOSIS — Z23 Encounter for immunization: Secondary | ICD-10-CM

## 2015-02-23 DIAGNOSIS — Z7982 Long term (current) use of aspirin: Secondary | ICD-10-CM | POA: Insufficient documentation

## 2015-02-23 DIAGNOSIS — Z801 Family history of malignant neoplasm of trachea, bronchus and lung: Secondary | ICD-10-CM | POA: Insufficient documentation

## 2015-02-23 DIAGNOSIS — K219 Gastro-esophageal reflux disease without esophagitis: Secondary | ICD-10-CM | POA: Insufficient documentation

## 2015-02-23 DIAGNOSIS — Z510 Encounter for antineoplastic radiation therapy: Secondary | ICD-10-CM | POA: Insufficient documentation

## 2015-02-23 DIAGNOSIS — Z79899 Other long term (current) drug therapy: Secondary | ICD-10-CM | POA: Diagnosis not present

## 2015-02-23 HISTORY — DX: Malignant neoplasm of prostate: C61

## 2015-02-23 NOTE — Progress Notes (Signed)
[facility] NEW PATIENT EVALUATION  Name: [name] [name] MRN: [id number]  Date:   02/23/2015           DOB: January 11, 1951  Status: outpatient   CC: [name] Cower, MD , Dr. Katrine [name]    REFERRING PHYSICIAN: Dr. Katrine [name]  DIAGNOSIS: Stage T1c intermediate risk adenocarcinoma prostate   HISTORY OF PRESENT ILLNESS:  [name] [name] is a 64 y.o. male who is seen today through the courtesy Dr. Gaynelle [name] for evaluation of his stage T1c intermediate risk adenocarcinoma prostate.  His PSA in December 2014 was 3.11, rising to 4.53 by December 2015, and most recently 5.57 on 10/13/2014.  Dr. Gaynelle [name] performed ultrasound-guided biopsies on 02/04/2015.  He was found have Gleason 7 (3+4) involving 10% of one core from the right apex and Gleason 7 (4+3) involving 30% of one core from the right lateral mid gland and 20% of one core from the right lateral apex.  He had Gleason 6 (3+3) involving 5% of one core from the right lateral base.  Remaining biopsies were benign.  His gland volume was 31.4 mL.  He does have moderate obstructive symptomatology with an I PSS score of 13.  He has a history of BPH with outlet obstructive symptomatology which is improved with tamsulosin.  He has used Ditropan in the past for lower urinary tract symptoms but this was discontinued since it gave him a dry mouth and he did not see much benefit.  He claims to be potent but has some degree of erectile dysfunction.  No GI difficulties.  Past urologic history remarkable for cystoscopy in March 2014 for workup of microhematuria.  PREVIOUS RADIATION THERAPY: No   PAST MEDICAL HISTORY:  has a past medical history of GERD (07/31/2007); HYPERLIPIDEMIA (01/04/2007); HYPERTENSION (01/04/2007); OVERACTIVE BLADDER (03/06/2007); BENIGN PROSTATIC HYPERTROPHY (05/06/2010); COMMON MIGRAINE (07/31/2007); and Prostate cancer ([place]) (02/04/15).     PAST SURGICAL HISTORY: No past surgical history on  file.   FAMILY HISTORY: family history includes Lung cancer (age of onset: 73) in his father.  His father died of lung cancer 19.  His mother died following a heart attack is 21.  No family history of prostate cancer, he has 3 brothers.   SOCIAL HISTORY:  reports that he has never smoked. He does not have any smokeless tobacco history on file. He reports that he drinks alcohol.  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   ALLERGIES: Celecoxib; Crestor; Detrol; and Ditropan   MEDICATIONS:  Current Outpatient Prescriptions  Medication Sig Dispense Refill  . amLODipine (NORVASC) 10 MG tablet TAKE 1 TABLET EVERY DAY 90 tablet 3  . aspirin 81 MG tablet Take 81 mg by mouth daily.     Marland Kitchen atorvastatin (LIPITOR) 10 MG tablet TAKE 1 TABLET  DAILY 90 tablet 3  . benazepril (LOTENSIN) 20 MG tablet TAKE 1 TABLET EVERY DAY 90 tablet 3  . fesoterodine (TOVIAZ) 4 MG TB24 tablet Take 1 tablet (4 mg total) by mouth daily. 90 tablet 3  . polyethylene glycol (MIRALAX / GLYCOLAX) packet Take 17 g by mouth as needed.     . Tamsulosin HCl (FLOMAX) 0.4 MG CAPS Take 1 capsule (0.4 mg total) by mouth daily. 30 capsule 11   No current facility-administered medications for this encounter.     REVIEW OF SYSTEMS:  Pertinent items are noted in HPI.    PHYSICAL EXAM:  height is 5\' 7"  (1.702 m) and weight is  195 lb 8 oz (88.678 kg). His oral temperature is 98 F (36.7 C). His blood pressure is 143/83 and his pulse is 65. His respiration is 20.   Alert and oriented.  Rectal examination: His prostate gland is small and is without focal induration or nodularity.  I'm unable to fully palpate the base of his gland.   LABORATORY DATA:  Lab Results  Component Value Date   WBC 8.4 04/08/2014   HGB 15.2 04/08/2014   HCT 45.9 04/08/2014   MCV 91.9 04/08/2014   PLT 318.0 04/08/2014   Lab Results  Component Value Date   NA 135 04/08/2014   K 3.6 04/08/2014   CL 101 04/08/2014   CO2 27  04/08/2014   Lab Results  Component Value Date   ALT 28 04/08/2014   AST 26 04/08/2014   ALKPHOS 68 04/08/2014   BILITOT 1.0 04/08/2014   PSA 5.57 from 10/13/2014.   IMPRESSION: Stage T1c intermediate risk adenocarcinoma prostate.  I explained to the patient and his wife that his prognosis is related to his stage, PSA level, and Gleason score.  His stage and PSA level are favorable while his Gleason score of 7 is of intermediate favorability.  We discussed robotic surgery versus observation versus radiation therapy.  His [facility] nomogram calculation shows a 38% chance for having organ confined disease,  a 59% chance for extracapsular extension, 8% risk for lymph node involvement, and 7% risk for seminal vesicle invasion.  Radiation therapy options include 5 weeks of external beam followed by seed implantation or 8 weeks of external beam/IMRT.  With his degree of risk for extracapsular extension, he is not a candidate for seed implantation alone.  With his  IPSS score is 13, he is borderline for a seed implant boost from a physiologic standpoint.  I feel that he would be best served by external beam/IMRT if he wants to proceed with radiation therapy.  We discussed the potential acute and late toxicities of radiation therapy.  We discussed bladder filling to minimize urinary toxicity.  I told him that he should see one of robotic prostate surgeons to discuss robotic surgery before making up his mind regarding his choice of therapy.  He understands that he would need to have placement of 3 gold seed markers should he want to consider radiation therapy.  He is to call me if he wants to proceed with radiation therapy after he has visited with one of the robotic surgeons.  Arrangements can be made by Dr. Gaynelle [name].   PLAN: As discussed above.  I spent 60  minutes face to face with the patient and more than 50% of that time was spent in counseling and/or coordination of care.

## 2015-02-23 NOTE — Progress Notes (Signed)
GU Location of Tumor / Histology: Adenocarcinoma of the Prostate  If Prostate Cancer, Gleason Score is (4 + 3) and PSA is (5.57)  [name] [name] was referred to Dr.Tannenbaum on 10/29/14 for an elevated PSA of 5.57  Biopsies of Prostate  02/04/15 Adenocarcinoma of the Prostate  Right Apex: Prostatic Adenocarcinoma 3+4 Involves 10% of one core (Pattern 4=10)  Right Base Lateral Prostatic Adenocarcinoma, Gleason Score 3+3=6. Involves 5% of one core  Right Mid Lateral- Prostatic Adenocarcinoma, Gleason Score 4+3=7 INvolves 30% of one Core (pattern 4=60%)  Right Apex Lateral - Prostatic Adenocarcinoma, Gleason Score 4+3 = 7, Involves 20% of one core(Pattern 4= 80%)  Past/Anticipated interventions by urology, if any: Biospy of the Prostate  Past/Anticipated interventions by medical oncology, if any: Unknown  Weight changes, if any:   Bowel/Bladder complaints, if any:  Hx of BPH, and difficulty voiding and hesistancy  Nausea/Vomiting, if any: }  Pain issues, if any:    SAFETY ISSUES:  Prior radiation? NO  Pacemaker/ICD?NO  Is the patient on methotrexate? NO  Current Complaints / other details:  Married, 3 children,  Company secretary,  non smoker,  No alcohol or illicit drug use,  Father 79, lung cancer ,  Allergies: Celebrex caps, Ditropan tabsPlease see the Nurse Progress Note in the MD Initial Consult Encounter for this patient.

## 2015-03-09 DIAGNOSIS — C61 Malignant neoplasm of prostate: Secondary | ICD-10-CM | POA: Diagnosis not present

## 2015-03-15 ENCOUNTER — Encounter: Payer: Self-pay | Admitting: Radiation Oncology

## 2015-03-15 NOTE — Progress Notes (Signed)
Dr. Katrine Coho  [name] [name] called me today, he wants to proceed with external beam/IMRT.  He met with Dr. Phebe Colla to discuss robotic surgery.  He wants to proceed with external beam/IMRT.  I told the patient that we need to have 3 gold markers placed for image guidance.  After this is done, he will visit [place] for CT simulation.  I offered to have Dr. [name] do his CT simulation since I will be retiring at the end of the year.  He tells me he wants me to do his CT simulation, and I more than happy to do so.  We will, we asked Dr. Gaynelle Arabian to place 3 gold seed markers and then have him return here within a few days for his CT simulation.  We again discussed bladder filling for his CT simulation and daily treatment.

## 2015-03-26 ENCOUNTER — Ambulatory Visit (INDEPENDENT_AMBULATORY_CARE_PROVIDER_SITE_OTHER)
Admission: RE | Admit: 2015-03-26 | Discharge: 2015-03-26 | Disposition: A | Discharge status: Home / Self Care | Payer: Medicare Other | Source: Ambulatory Visit | Attending: Internal Medicine | Admitting: Internal Medicine

## 2015-03-26 ENCOUNTER — Ambulatory Visit (INDEPENDENT_AMBULATORY_CARE_PROVIDER_SITE_OTHER): Payer: Medicare Other | Admitting: Internal Medicine

## 2015-03-26 ENCOUNTER — Other Ambulatory Visit (INDEPENDENT_AMBULATORY_CARE_PROVIDER_SITE_OTHER): Payer: Medicare Other

## 2015-03-26 ENCOUNTER — Encounter: Payer: Self-pay | Admitting: Internal Medicine

## 2015-03-26 VITALS — BP 134/82 | HR 70 | Temp 97.8°F | Ht 67.0 in | Wt 194.0 lb

## 2015-03-26 DIAGNOSIS — E785 Hyperlipidemia, unspecified: Secondary | ICD-10-CM

## 2015-03-26 DIAGNOSIS — C61 Malignant neoplasm of prostate: Secondary | ICD-10-CM

## 2015-03-26 DIAGNOSIS — I10 Essential (primary) hypertension: Secondary | ICD-10-CM | POA: Diagnosis not present

## 2015-03-26 DIAGNOSIS — Z20828 Contact with and (suspected) exposure to other viral communicable diseases: Secondary | ICD-10-CM

## 2015-03-26 DIAGNOSIS — R0600 Dyspnea, unspecified: Secondary | ICD-10-CM | POA: Diagnosis not present

## 2015-03-26 DIAGNOSIS — R531 Weakness: Secondary | ICD-10-CM | POA: Insufficient documentation

## 2015-03-26 LAB — BASIC METABOLIC PANEL
BUN: 12 mg/dL (ref 6–23)
CO2: 28 mEq/L (ref 19–32)
CREATININE: 1.01 mg/dL (ref 0.40–1.50)
Calcium: 9.6 mg/dL (ref 8.4–10.5)
Chloride: 104 mEq/L (ref 96–112)
GFR: 94.8 mL/min (ref >60.00)
GLUCOSE: 103 mg/dL — AB (ref 70–99)
POTASSIUM: 4.1 meq/L (ref 3.5–5.1)
Sodium: 140 mEq/L (ref 135–145)

## 2015-03-26 LAB — CBC WITH DIFFERENTIAL/PLATELET
Basophils Absolute: 0 10*3/uL (ref 0.0–0.1)
Basophils Relative: 0.4 % (ref 0.0–3.0)
EOS PCT: 0.7 % (ref 0.0–5.0)
Eosinophils Absolute: 0.1 10*3/uL (ref 0.0–0.7)
HCT: 45.5 % (ref 39.0–52.0)
Hemoglobin: 15.1 g/dL (ref 13.0–17.0)
LYMPHS ABS: 2.1 10*3/uL (ref 0.7–4.0)
Lymphocytes Relative: 25.1 % (ref 12.0–46.0)
MCHC: 33.3 g/dL (ref 30.0–36.0)
MCV: 91.9 fl (ref 78.0–100.0)
MONOS PCT: 7.6 % (ref 3.0–12.0)
Monocytes Absolute: 0.6 10*3/uL (ref 0.1–1.0)
NEUTROS ABS: 5.4 10*3/uL (ref 1.4–7.7)
NEUTROS PCT: 66.2 % (ref 43.0–77.0)
PLATELETS: 305 10*3/uL (ref 150.0–400.0)
RBC: 4.95 Mil/uL (ref 4.22–5.81)
RDW: 14.7 % (ref 11.5–15.5)
WBC: 8.2 10*3/uL (ref 4.0–10.5)

## 2015-03-26 LAB — URINALYSIS, ROUTINE W REFLEX MICROSCOPIC
Bilirubin Urine: NEGATIVE
Ketones, ur: NEGATIVE
Leukocytes, UA: NEGATIVE
NITRITE: NEGATIVE
SPECIFIC GRAVITY, URINE: 1.015 (ref 1.000–1.030)
TOTAL PROTEIN, URINE-UPE24: NEGATIVE
URINE GLUCOSE: NEGATIVE
Urobilinogen, UA: 0.2 (ref 0.0–1.0)
WBC, UA: NONE SEEN (ref 0–?)
pH: 8 (ref 5.0–8.0)

## 2015-03-26 LAB — HEPATITIS C ANTIBODY: HCV Ab: NEGATIVE

## 2015-03-26 LAB — HEPATIC FUNCTION PANEL
ALBUMIN: 4.6 g/dL (ref 3.5–5.2)
ALT: 27 U/L (ref 0–53)
AST: 25 U/L (ref 0–37)
Alkaline Phosphatase: 67 U/L (ref 39–117)
BILIRUBIN TOTAL: 1.3 mg/dL — AB (ref 0.2–1.2)
Bilirubin, Direct: 0.2 mg/dL (ref 0.0–0.3)
Total Protein: 7.7 g/dL (ref 6.0–8.3)

## 2015-03-26 LAB — LIPID PANEL
CHOL/HDL RATIO: 3
CHOLESTEROL: 179 mg/dL (ref 0–200)
HDL: 65.5 mg/dL (ref >39.00)
LDL CALC: 102 mg/dL — AB (ref 0–99)
NonHDL: 113.72
TRIGLYCERIDES: 59 mg/dL (ref 0.0–149.0)
VLDL: 11.8 mg/dL (ref 0.0–40.0)

## 2015-03-26 LAB — PSA: PSA: 5.39 ng/mL — ABNORMAL HIGH (ref 0.10–4.00)

## 2015-03-26 LAB — TSH: TSH: 0.98 u[IU]/mL (ref 0.35–4.50)

## 2015-03-26 NOTE — Assessment & Plan Note (Signed)
Pt also requests fu psa, as well as pt adamant requests change of urologist from Dr Gaynelle Arabian as he simply has not been able to get his gold marker procedure scheduled in a timely fashion, so he can then proceed with CT simulation, then ext XRT per Dr Valere Dross

## 2015-03-26 NOTE — Assessment & Plan Note (Addendum)
Etiology unclear, for ecg, cxr, echo, in addition to routine labs today, exam seems overall benign  Note:  Total time for pt hx, exam, review of record with pt in the room, determination of diagnoses and plan for further eval and tx is > 40 min, with over 50% spent in coordination and counseling of patient  ECG reviewed as per emr

## 2015-03-26 NOTE — Progress Notes (Signed)
Pre visit review using our clinic review tool, if applicable. No additional management support is needed unless otherwise documented below in the visit note. 

## 2015-03-26 NOTE — Assessment & Plan Note (Signed)
stable overall by history and exam, recent data reviewed with pt, and pt to continue medical treatment as before,  to f/u any worsening symptoms or concerns BP Readings from Last 3 Encounters:  03/26/15 134/82  02/23/15 143/83  10/13/14 132/82

## 2015-03-26 NOTE — Assessment & Plan Note (Signed)
stable overall by history and exam, recent data reviewed with pt, and pt to continue medical treatment as before,  to f/u any worsening symptoms or concerns Lab Results  Component Value Date   LDLCALC 102* 04/08/2014    for f/u labs todya

## 2015-03-26 NOTE — Progress Notes (Signed)
Subjective:    Patient ID: [name] [name], male    DOB: 05-May-1951, 64 y.o.   MRN: [id number]  HPI  /Here for yearly f/u;  Overall doing ok;  Pt denies Chest pain, worsening wheezing, orthopnea, PND, worsening LE edema, palpitations, dizziness or syncope.  Pt denies neurological change such as new headache, facial or extremity weakness.  Pt denies polydipsia, polyuria, or low sugar symptoms. Pt states overall good compliance with treatment and medications, good tolerability, and has been trying to follow appropriate diet.  Pt denies worsening depressive symptoms, suicidal ideation or panic. No fever, night sweats, wt loss, loss of appetite, or other constitutional symptoms.  Pt states good ability with ADL's, has low fall risk, home safety reviewed and adequate, no other significant changes in hearing or vision, Gets tired quickly with doing activities outside with exhaustion for the past week with mild sob/doe, but can still do treadmill may be 3-5 days out of week, can go 30 min, not sure how fast or how far.  Now with diagnosis prostate ca oct 2016, has been referred by urology Dr Gaynelle Arabian to Dr Valere Dross who is ready to do Ct simulation and ext XRT, but needs to have gold markers placed.  Pt has not been able to get this scheduled with Dr Arlyn Leak office in the last 10 days, and requests referral elsewhere to have this done asap.   Last stress test neg 2015.  Last cxr neg for acute 08/2014 Past Medical History  Diagnosis Date  . GERD 07/31/2007  . HYPERLIPIDEMIA 01/04/2007  . HYPERTENSION 01/04/2007  . OVERACTIVE BLADDER 03/06/2007  . BENIGN PROSTATIC HYPERTROPHY 05/06/2010  . COMMON MIGRAINE 07/31/2007  . Prostate cancer ([place]) 02/04/15   No past surgical history on file.  reports that he has never smoked. He does not have any smokeless tobacco history on file. He reports that he drinks alcohol. His drug history is not on file. family history includes Lung cancer (age of onset: 53) in his  father. Allergies  Allergen Reactions  . Celecoxib Other (See Comments)    Stroke like symptoms  . Crestor [Rosuvastatin]     weakness  . Detrol [Tolterodine] Other (See Comments)    headache  . Ditropan [Oxybutynin] Other (See Comments)    Dry mouth   Current Outpatient Prescriptions on File Prior to Visit  Medication Sig Dispense Refill  . amLODipine (NORVASC) 10 MG tablet TAKE 1 TABLET EVERY DAY 90 tablet 3  . aspirin 81 MG tablet Take 81 mg by mouth daily.     Marland Kitchen atorvastatin (LIPITOR) 10 MG tablet TAKE 1 TABLET  DAILY 90 tablet 3  . benazepril (LOTENSIN) 20 MG tablet TAKE 1 TABLET EVERY DAY 90 tablet 3  . polyethylene glycol (MIRALAX / GLYCOLAX) packet Take 17 g by mouth as needed.     . Tamsulosin HCl (FLOMAX) 0.4 MG CAPS Take 1 capsule (0.4 mg total) by mouth daily. 30 capsule 11  . fesoterodine (TOVIAZ) 4 MG TB24 tablet Take 1 tablet (4 mg total) by mouth daily. (Patient not taking: Reported on 03/26/2015) 90 tablet 3   No current facility-administered medications on file prior to visit.    Review of Systems Constitutional: Negative for increased diaphoresis, other activity, appetite or siginficant weight change other than noted HENT: Negative for worsening hearing loss, ear pain, facial swelling, mouth sores and neck stiffness.   Eyes: Negative for other worsening pain, redness or visual disturbance.  Respiratory: Negative for shortness of breath and wheezing  Cardiovascular: Negative for chest pain and palpitations.  Gastrointestinal: Negative for diarrhea, blood in stool, abdominal distention or other pain Genitourinary: Negative for hematuria, flank pain or change in urine volume.  Musculoskeletal: Negative for myalgias or other joint complaints.  Skin: Negative for color change and wound or drainage.  Neurological: Negative for syncope and numbness. other than noted Hematological: Negative for adenopathy. or other swelling Psychiatric/Behavioral: Negative for  hallucinations, SI, self-injury, decreased concentration or other worsening agitation.      Objective:   Physical Exam BP 134/82 mmHg  Pulse 70  Temp(Src) 97.8 F (36.6 C) (Oral)  Ht 5\' 7"  (1.702 m)  Wt 194 lb (87.998 kg)  BMI 30.38 kg/m2  SpO2 98% VS noted,  Constitutional: Pt is oriented to person, place, and time. Appears well-developed and well-nourished, in no significant distress Head: Normocephalic and atraumatic.  Right Ear: External ear normal.  Left Ear: External ear normal.  Nose: Nose normal.  Mouth/Throat: Oropharynx is clear and moist.  Eyes: Conjunctivae and EOM are normal. Pupils are equal, round, and reactive to light.  Neck: Normal range of motion. Neck supple. No JVD present. No tracheal deviation present or significant neck LA or mass Cardiovascular: Normal rate, regular rhythm, normal heart sounds and intact distal pulses.   Pulmonary/Chest: Effort normal and breath sounds without rales or wheezing  Abdominal: Soft. Bowel sounds are normal. NT. No HSM  Musculoskeletal: Normal range of motion. Exhibits no edema.  Lymphadenopathy:  Has no cervical adenopathy.  Neurological: Pt is alert and oriented to person, place, and time. Pt has normal reflexes. No cranial nerve deficit. Motor grossly intact Skin: Skin is warm and dry. No rash noted.  Psychiatric:  Has mild nervousmood and affect. Behavior is normal.     Assessment & Plan:

## 2015-03-26 NOTE — Patient Instructions (Signed)
Your EKG was [facility] today  Please continue all other medications as before, and refills have been done if requested.  Please have the pharmacy call with any other refills you may need.  Please continue your efforts at being more active, low cholesterol diet, and weight control.  You are otherwise up to date with prevention measures today.  Please keep your appointments with your specialists as you may have planned  You will be contacted regarding the referral for: urology for the gold marker placement, and echocardiogram  Please go to the XRAY Department in the Basement (go straight as you get off the elevator) for the x-ray testing  Please go to the LAB in the Basement (turn left off the elevator) for the tests to be done today  You will be contacted by phone if any changes need to be made immediately.  Otherwise, you will receive a letter about your results with an explanation, but please check with MyChart first.  Please remember to sign up for MyChart if you have not done so, as this will be important to you in the future with finding out test results, communicating by private email, and scheduling acute appointments online when needed.  Please return in 6 months, or sooner if needed

## 2015-03-29 ENCOUNTER — Telehealth: Payer: Self-pay | Admitting: *Deleted

## 2015-03-29 NOTE — Telephone Encounter (Signed)
Called patient to inform of gold seed placement on 04-12-15 - arrival time - 3 pm @ [facility] and his sim on 04-19-15 @ 10 am @ Dr. Charlton Amor Office, lvm for a return call

## 2015-04-06 ENCOUNTER — Other Ambulatory Visit: Payer: Self-pay

## 2015-04-06 ENCOUNTER — Ambulatory Visit (HOSPITAL_COMMUNITY): Payer: Medicare Other | Attending: Internal Medicine

## 2015-04-06 DIAGNOSIS — I10 Essential (primary) hypertension: Secondary | ICD-10-CM | POA: Insufficient documentation

## 2015-04-06 DIAGNOSIS — R531 Weakness: Secondary | ICD-10-CM

## 2015-04-06 DIAGNOSIS — C61 Malignant neoplasm of prostate: Secondary | ICD-10-CM

## 2015-04-06 DIAGNOSIS — R0600 Dyspnea, unspecified: Secondary | ICD-10-CM | POA: Insufficient documentation

## 2015-04-06 DIAGNOSIS — I517 Cardiomegaly: Secondary | ICD-10-CM | POA: Insufficient documentation

## 2015-04-06 DIAGNOSIS — E785 Hyperlipidemia, unspecified: Secondary | ICD-10-CM | POA: Insufficient documentation

## 2015-04-12 DIAGNOSIS — C61 Malignant neoplasm of prostate: Secondary | ICD-10-CM | POA: Diagnosis not present

## 2015-04-19 ENCOUNTER — Ambulatory Visit
Admission: RE | Admit: 2015-04-19 | Discharge: 2015-04-19 | Disposition: A | Discharge status: Home / Self Care | Payer: Medicare Other | Source: Ambulatory Visit | Attending: Radiation Oncology | Admitting: Radiation Oncology

## 2015-04-19 DIAGNOSIS — Z510 Encounter for antineoplastic radiation therapy: Secondary | ICD-10-CM | POA: Diagnosis not present

## 2015-04-19 DIAGNOSIS — C61 Malignant neoplasm of prostate: Secondary | ICD-10-CM

## 2015-04-19 NOTE — Progress Notes (Signed)
Complex simulation/treatment planning note: the patient was taken to the CT simulator. A Vac lock immobilization device was constructed.  A red rubber tube was placed within the rectal vault.  He was then catheterized and contrast instilled into  the bladder/urethra. He was then scanned.  I placed an isocenter along the center of the prostate.   The CT data set was sent to the MIM  Planning system where contoured his prostate, seminal vesicles, rectum , bladder, and lower rectosigmoid colon.  I'm prescribing 7800 cGy in 40 sessions to his prostate PTV which represents the prostate +0.8 cm except for 0.5 cm along the rectum.  I prescribing 5600 cGy in 40 sessions to his seminal vesicles which represents the seminal vesicles +0.5 cm.  He is now ready for IMRT simulation/treatment planning.

## 2015-04-20 ENCOUNTER — Encounter: Payer: Self-pay | Admitting: Radiation Oncology

## 2015-04-20 DIAGNOSIS — Z510 Encounter for antineoplastic radiation therapy: Secondary | ICD-10-CM | POA: Diagnosis not present

## 2015-04-20 DIAGNOSIS — C61 Malignant neoplasm of prostate: Secondary | ICD-10-CM | POA: Diagnosis not present

## 2015-04-20 NOTE — Progress Notes (Signed)
IMRT simulation/treatment planning note: The patient completed IMRT treatment planning in the management of his carcinoma the prostate.  IMRT was chosen to decrease the risk for both acute and late bladder and rectal toxicity compared to 3-D conformal or conventional radiation therapy.  Dose volume histograms were obtained for the target structures including the prostate and seminal vesicles and avoidance structures including the bladder, rectum, and femoral heads.  We met our departmental guidelines.  I'm prescribing 7800 cGy in 40 sessions to his prostate PTV and 5600 cGy in 40 sessions to his seminal vesicle PTV.  He is being treated with dual ARC VMAT IMRT with 6 MV photons.

## 2015-04-29 ENCOUNTER — Ambulatory Visit
Admission: RE | Admit: 2015-04-29 | Discharge: 2015-04-29 | Disposition: A | Discharge status: Home / Self Care | Payer: Medicare Other | Source: Ambulatory Visit | Attending: Radiation Oncology | Admitting: Radiation Oncology

## 2015-04-29 ENCOUNTER — Encounter: Payer: Self-pay | Admitting: Radiation Oncology

## 2015-04-29 DIAGNOSIS — C61 Malignant neoplasm of prostate: Secondary | ICD-10-CM

## 2015-04-29 DIAGNOSIS — Z510 Encounter for antineoplastic radiation therapy: Secondary | ICD-10-CM | POA: Diagnosis not present

## 2015-04-29 NOTE — Progress Notes (Signed)
Chart note: The patient began his VMAT IMRT today in the management of his carcinoma the prostate.  He is being treated with 2 sets of dynamic MLCs corresponding to one set of IMRT treatment devices (77338). 

## 2015-04-29 NOTE — Progress Notes (Signed)
Weekly Management Note:  Site: Prostate Current Dose:  195  cGy Projected Dose: 7800  cGy  Narrative: The patient is seen today for routine under treatment assessment. CBCT/MVCT images/port films were reviewed. The chart was reviewed.   Bladder filling is excellent today.  No new GU or GI difficulties.  He began his radiation therapy today.  Physical Examination: There were no vitals filed for this visit..  Weight:  .  No change.  Impression: Tolerating radiation therapy well.  Plan: Continue radiation therapy as planned.

## 2015-04-30 ENCOUNTER — Ambulatory Visit
Admission: RE | Admit: 2015-04-30 | Discharge: 2015-04-30 | Disposition: A | Discharge status: Home / Self Care | Payer: Medicare Other | Source: Ambulatory Visit | Attending: Radiation Oncology | Admitting: Radiation Oncology

## 2015-04-30 DIAGNOSIS — C61 Malignant neoplasm of prostate: Secondary | ICD-10-CM | POA: Diagnosis not present

## 2015-04-30 DIAGNOSIS — Z510 Encounter for antineoplastic radiation therapy: Secondary | ICD-10-CM | POA: Diagnosis not present

## 2015-05-04 ENCOUNTER — Ambulatory Visit
Admission: RE | Admit: 2015-05-04 | Discharge: 2015-05-04 | Disposition: A | Discharge status: Home / Self Care | Payer: Medicare Other | Source: Ambulatory Visit | Attending: Radiation Oncology | Admitting: Radiation Oncology

## 2015-05-04 DIAGNOSIS — Z510 Encounter for antineoplastic radiation therapy: Secondary | ICD-10-CM | POA: Diagnosis not present

## 2015-05-04 DIAGNOSIS — C61 Malignant neoplasm of prostate: Secondary | ICD-10-CM | POA: Diagnosis not present

## 2015-05-05 ENCOUNTER — Ambulatory Visit
Admission: RE | Admit: 2015-05-05 | Discharge: 2015-05-05 | Disposition: A | Discharge status: Home / Self Care | Payer: Medicare Other | Source: Ambulatory Visit | Attending: Radiation Oncology | Admitting: Radiation Oncology

## 2015-05-05 DIAGNOSIS — H25013 Cortical age-related cataract, bilateral: Secondary | ICD-10-CM | POA: Diagnosis not present

## 2015-05-05 DIAGNOSIS — H52223 Regular astigmatism, bilateral: Secondary | ICD-10-CM | POA: Diagnosis not present

## 2015-05-05 DIAGNOSIS — H11153 Pinguecula, bilateral: Secondary | ICD-10-CM | POA: Diagnosis not present

## 2015-05-05 DIAGNOSIS — H524 Presbyopia: Secondary | ICD-10-CM | POA: Diagnosis not present

## 2015-05-05 DIAGNOSIS — H5203 Hypermetropia, bilateral: Secondary | ICD-10-CM | POA: Diagnosis not present

## 2015-05-05 DIAGNOSIS — H40022 Open angle with borderline findings, high risk, left eye: Secondary | ICD-10-CM | POA: Diagnosis not present

## 2015-05-05 DIAGNOSIS — C61 Malignant neoplasm of prostate: Secondary | ICD-10-CM | POA: Diagnosis not present

## 2015-05-05 DIAGNOSIS — H35373 Puckering of macula, bilateral: Secondary | ICD-10-CM | POA: Diagnosis not present

## 2015-05-05 DIAGNOSIS — H40011 Open angle with borderline findings, low risk, right eye: Secondary | ICD-10-CM | POA: Diagnosis not present

## 2015-05-05 DIAGNOSIS — H18413 Arcus senilis, bilateral: Secondary | ICD-10-CM | POA: Diagnosis not present

## 2015-05-05 DIAGNOSIS — H2513 Age-related nuclear cataract, bilateral: Secondary | ICD-10-CM | POA: Diagnosis not present

## 2015-05-05 DIAGNOSIS — Z510 Encounter for antineoplastic radiation therapy: Secondary | ICD-10-CM | POA: Diagnosis not present

## 2015-05-06 ENCOUNTER — Ambulatory Visit
Admission: RE | Admit: 2015-05-06 | Discharge: 2015-05-06 | Disposition: A | Discharge status: Home / Self Care | Payer: Medicare Other | Source: Ambulatory Visit | Attending: Radiation Oncology | Admitting: Radiation Oncology

## 2015-05-06 DIAGNOSIS — Z510 Encounter for antineoplastic radiation therapy: Secondary | ICD-10-CM | POA: Diagnosis not present

## 2015-05-06 DIAGNOSIS — C61 Malignant neoplasm of prostate: Secondary | ICD-10-CM | POA: Diagnosis not present

## 2015-05-07 ENCOUNTER — Encounter: Payer: Self-pay | Admitting: Radiation Oncology

## 2015-05-07 ENCOUNTER — Ambulatory Visit
Admission: RE | Admit: 2015-05-07 | Discharge: 2015-05-07 | Disposition: A | Discharge status: Home / Self Care | Payer: No Typology Code available for payment source | Source: Ambulatory Visit | Attending: Radiation Oncology | Admitting: Radiation Oncology

## 2015-05-07 ENCOUNTER — Ambulatory Visit
Admission: RE | Admit: 2015-05-07 | Discharge: 2015-05-07 | Disposition: A | Discharge status: Home / Self Care | Payer: Medicare Other | Source: Ambulatory Visit | Attending: Radiation Oncology | Admitting: Radiation Oncology

## 2015-05-07 VITALS — BP 132/75 | HR 70 | Temp 98.0°F | Ht 67.0 in | Wt 203.3 lb

## 2015-05-07 DIAGNOSIS — C61 Malignant neoplasm of prostate: Secondary | ICD-10-CM | POA: Diagnosis not present

## 2015-05-07 DIAGNOSIS — Z510 Encounter for antineoplastic radiation therapy: Secondary | ICD-10-CM | POA: Diagnosis not present

## 2015-05-07 NOTE — Progress Notes (Addendum)
[name] [name] states no changes since starting radiation.

## 2015-05-07 NOTE — Progress Notes (Signed)
  Radiation Oncology         [phone]   Name: [name] [name] MRN: [id number]   Date: 05/07/2015  DOB: [birth year]     Weekly Radiation Therapy Management    ICD-9-CM ICD-10-CM   1. Malignant neoplasm of prostate (HCC) 185 C61     Current Dose: 11.7 Gy  Planned Dose:  78 Gy  Narrative The patient presents for routine under treatment assessment.  [name] [name] has no changes since starting radiation. He continues to have urgency with urination, which started before radiation.  The patient is without complaint. Set-up films were reviewed. The chart was checked.  Physical Findings  height is 5\' 7"  (1.702 m) and weight is 203 lb 4.8 oz (92.216 kg). His temperature is 98 F (36.7 C). His blood pressure is 132/75 and his pulse is 70. . Weight essentially stable.  No significant changes.  Impression The patient is tolerating radiation.  Plan Continue treatment as planned.         [name] [name], M.D.  This document serves as a record of services personally performed by [name], MD. It was created on his behalf by Lendon Collar, a trained medical scribe. The creation of this record is based on the scribe's personal observations and the provider's statements to them. This document has been checked and approved by the attending provider.

## 2015-05-11 ENCOUNTER — Ambulatory Visit
Admission: RE | Admit: 2015-05-11 | Discharge: 2015-05-11 | Disposition: A | Discharge status: Home / Self Care | Payer: Medicare Other | Source: Ambulatory Visit | Attending: Radiation Oncology | Admitting: Radiation Oncology

## 2015-05-11 DIAGNOSIS — Z510 Encounter for antineoplastic radiation therapy: Secondary | ICD-10-CM | POA: Diagnosis not present

## 2015-05-11 DIAGNOSIS — R351 Nocturia: Secondary | ICD-10-CM | POA: Diagnosis not present

## 2015-05-11 DIAGNOSIS — R300 Dysuria: Secondary | ICD-10-CM | POA: Diagnosis not present

## 2015-05-11 DIAGNOSIS — C61 Malignant neoplasm of prostate: Secondary | ICD-10-CM | POA: Insufficient documentation

## 2015-05-11 DIAGNOSIS — H2513 Age-related nuclear cataract, bilateral: Secondary | ICD-10-CM | POA: Diagnosis not present

## 2015-05-11 DIAGNOSIS — H401131 Primary open-angle glaucoma, bilateral, mild stage: Secondary | ICD-10-CM | POA: Diagnosis not present

## 2015-05-12 ENCOUNTER — Ambulatory Visit
Admission: RE | Admit: 2015-05-12 | Discharge: 2015-05-12 | Disposition: A | Discharge status: Home / Self Care | Payer: Medicare Other | Source: Ambulatory Visit | Attending: Radiation Oncology | Admitting: Radiation Oncology

## 2015-05-12 DIAGNOSIS — C61 Malignant neoplasm of prostate: Secondary | ICD-10-CM | POA: Diagnosis not present

## 2015-05-12 DIAGNOSIS — Z510 Encounter for antineoplastic radiation therapy: Secondary | ICD-10-CM | POA: Diagnosis not present

## 2015-05-12 DIAGNOSIS — R300 Dysuria: Secondary | ICD-10-CM | POA: Diagnosis not present

## 2015-05-12 DIAGNOSIS — R351 Nocturia: Secondary | ICD-10-CM | POA: Diagnosis not present

## 2015-05-13 ENCOUNTER — Ambulatory Visit
Admission: RE | Admit: 2015-05-13 | Discharge: 2015-05-13 | Disposition: A | Discharge status: Home / Self Care | Payer: Medicare Other | Source: Ambulatory Visit | Attending: Radiation Oncology | Admitting: Radiation Oncology

## 2015-05-13 DIAGNOSIS — Z510 Encounter for antineoplastic radiation therapy: Secondary | ICD-10-CM | POA: Diagnosis not present

## 2015-05-13 DIAGNOSIS — C61 Malignant neoplasm of prostate: Secondary | ICD-10-CM | POA: Diagnosis not present

## 2015-05-13 DIAGNOSIS — R300 Dysuria: Secondary | ICD-10-CM | POA: Diagnosis not present

## 2015-05-13 DIAGNOSIS — R351 Nocturia: Secondary | ICD-10-CM | POA: Diagnosis not present

## 2015-05-14 ENCOUNTER — Ambulatory Visit
Admission: RE | Admit: 2015-05-14 | Discharge: 2015-05-14 | Disposition: A | Discharge status: Home / Self Care | Payer: Medicare Other | Source: Ambulatory Visit | Attending: Radiation Oncology | Admitting: Radiation Oncology

## 2015-05-14 ENCOUNTER — Encounter: Payer: Self-pay | Admitting: Radiation Oncology

## 2015-05-14 VITALS — BP 135/73 | HR 60 | Resp 16 | Wt 201.6 lb

## 2015-05-14 DIAGNOSIS — C61 Malignant neoplasm of prostate: Secondary | ICD-10-CM | POA: Diagnosis not present

## 2015-05-14 DIAGNOSIS — R351 Nocturia: Secondary | ICD-10-CM | POA: Diagnosis not present

## 2015-05-14 DIAGNOSIS — R300 Dysuria: Secondary | ICD-10-CM | POA: Diagnosis not present

## 2015-05-14 DIAGNOSIS — Z510 Encounter for antineoplastic radiation therapy: Secondary | ICD-10-CM | POA: Diagnosis not present

## 2015-05-14 NOTE — Progress Notes (Signed)
  Radiation Oncology         [phone]   Name: [name] [name] MRN: [id number]   Date: 05/14/2015  DOB: 05-03-1951     Weekly Radiation Therapy Management    ICD-9-CM ICD-10-CM   1. Malignant neoplasm of prostate (HCC) 185 C61     Current Dose: 19.5 Gy  Planned Dose:  78 Gy  Narrative The patient presents for routine under treatment assessment.  Weight and vitals stable. Denies pain. Reports he continues to take Flomax as directed. Reports nocturia x 5. Reports fatigue associated with nocturia. Reports new onset of persistent dysuria. Denies hematuria. Denies diarrhea or loose bowels. Reports urgency but, denies incontinence or leakage. Reports increased frequency. Reports occasional difficulty emptying. Reports a strong steady urine stream for the most part. Reports he received post sim education from [facility].   The patient is without complaint. Set-up films were reviewed. The chart was checked.  Physical Findings  weight is 201 lb 9.6 oz (91.445 kg). His blood pressure is 135/73 and his pulse is 60. His respiration is 16. . Weight essentially stable.  No significant changes.  Impression The patient is tolerating radiation.  Plan Continue treatment as planned. Recommended for the patient to decrease liquid intake later in the day to avoid nocturia.         [name] [name], M.D.  This document serves as a record of services personally performed by [name], MD. It was created on his behalf by Arlyce Harman, a trained medical scribe. The creation of this record is based on the scribe's personal observations and the provider's statements to them. This document has been checked and approved by the attending provider.

## 2015-05-14 NOTE — Progress Notes (Signed)
Weight and vitals stable. Denies pain. Reports he continues to take Flomax as directed. Reports nocturia x 5. Reports fatigue associated with nocturia. Reports new onset of persistent dysuria. Denies hematuria. Denies diarrhea or loose bowels. Reports urgency but, denies incontinence or leakage. Reports occasional difficulty emptying. Reports a strong steady urine stream for the most part. Reports he received post sim education from [facility].   BP 135/73 mmHg  Pulse 60  Resp 16  Wt 201 lb 9.6 oz (91.445 kg) Wt Readings from Last 3 Encounters:  05/14/15 201 lb 9.6 oz (91.445 kg)  05/07/15 203 lb 4.8 oz (92.216 kg)  03/26/15 194 lb (87.998 kg)

## 2015-05-17 ENCOUNTER — Ambulatory Visit
Admission: RE | Admit: 2015-05-17 | Discharge: 2015-05-17 | Disposition: A | Discharge status: Home / Self Care | Payer: Medicare Other | Source: Ambulatory Visit | Attending: Radiation Oncology | Admitting: Radiation Oncology

## 2015-05-17 DIAGNOSIS — C61 Malignant neoplasm of prostate: Secondary | ICD-10-CM | POA: Diagnosis not present

## 2015-05-17 DIAGNOSIS — Z510 Encounter for antineoplastic radiation therapy: Secondary | ICD-10-CM | POA: Diagnosis not present

## 2015-05-17 DIAGNOSIS — R351 Nocturia: Secondary | ICD-10-CM | POA: Diagnosis not present

## 2015-05-17 DIAGNOSIS — R300 Dysuria: Secondary | ICD-10-CM | POA: Diagnosis not present

## 2015-05-18 ENCOUNTER — Ambulatory Visit
Admission: RE | Admit: 2015-05-18 | Discharge: 2015-05-18 | Disposition: A | Discharge status: Home / Self Care | Payer: Medicare Other | Source: Ambulatory Visit | Attending: Radiation Oncology | Admitting: Radiation Oncology

## 2015-05-18 DIAGNOSIS — C61 Malignant neoplasm of prostate: Secondary | ICD-10-CM | POA: Diagnosis not present

## 2015-05-18 DIAGNOSIS — H401131 Primary open-angle glaucoma, bilateral, mild stage: Secondary | ICD-10-CM | POA: Diagnosis not present

## 2015-05-18 DIAGNOSIS — Z510 Encounter for antineoplastic radiation therapy: Secondary | ICD-10-CM | POA: Diagnosis not present

## 2015-05-18 DIAGNOSIS — R300 Dysuria: Secondary | ICD-10-CM | POA: Diagnosis not present

## 2015-05-18 DIAGNOSIS — R351 Nocturia: Secondary | ICD-10-CM | POA: Diagnosis not present

## 2015-05-19 ENCOUNTER — Ambulatory Visit
Admission: RE | Admit: 2015-05-19 | Discharge: 2015-05-19 | Disposition: A | Discharge status: Home / Self Care | Payer: Medicare Other | Source: Ambulatory Visit | Attending: Radiation Oncology | Admitting: Radiation Oncology

## 2015-05-19 DIAGNOSIS — R300 Dysuria: Secondary | ICD-10-CM | POA: Diagnosis not present

## 2015-05-19 DIAGNOSIS — C61 Malignant neoplasm of prostate: Secondary | ICD-10-CM | POA: Diagnosis not present

## 2015-05-19 DIAGNOSIS — R351 Nocturia: Secondary | ICD-10-CM | POA: Diagnosis not present

## 2015-05-19 DIAGNOSIS — Z510 Encounter for antineoplastic radiation therapy: Secondary | ICD-10-CM | POA: Diagnosis not present

## 2015-05-20 ENCOUNTER — Encounter: Payer: Self-pay | Admitting: Radiation Oncology

## 2015-05-20 ENCOUNTER — Ambulatory Visit
Admission: RE | Admit: 2015-05-20 | Discharge: 2015-05-20 | Disposition: A | Discharge status: Home / Self Care | Payer: Medicare Other | Source: Ambulatory Visit | Attending: Radiation Oncology | Admitting: Radiation Oncology

## 2015-05-20 VITALS — BP 135/72 | HR 69 | Temp 98.6°F | Resp 18 | Wt 199.7 lb

## 2015-05-20 DIAGNOSIS — R351 Nocturia: Secondary | ICD-10-CM | POA: Diagnosis not present

## 2015-05-20 DIAGNOSIS — Z510 Encounter for antineoplastic radiation therapy: Secondary | ICD-10-CM | POA: Diagnosis not present

## 2015-05-20 DIAGNOSIS — C61 Malignant neoplasm of prostate: Secondary | ICD-10-CM

## 2015-05-20 DIAGNOSIS — R300 Dysuria: Secondary | ICD-10-CM | POA: Diagnosis not present

## 2015-05-20 NOTE — Progress Notes (Signed)
Weekly rad tx prostate 14/40,  Nocturia x 6-7 , has frequency, urgency,  And burning when first  Starting his stream, bowels okay, appetite good, no hematuria, takes flomax daily energy level okay 8:51 AM BP 135/72 mmHg  Pulse 69  Temp(Src) 98.6 F (37 C) (Oral)  Resp 18  Wt 199 lb 11.2 oz (90.583 kg)  Wt Readings from Last 3 Encounters:  05/20/15 199 lb 11.2 oz (90.583 kg)  05/14/15 201 lb 9.6 oz (91.445 kg)  05/07/15 203 lb 4.8 oz (92.216 kg)

## 2015-05-20 NOTE — Progress Notes (Signed)
  Radiation Oncology         [phone]   Name: [name] [name] MRN: [id number]   Date: 05/20/2015  DOB: Sep 17, 1950     Weekly Radiation Therapy Management    ICD-9-CM ICD-10-CM   1. Malignant neoplasm of prostate (HCC) 185 C61     Current Dose: 27.3 Gy  Planned Dose:  78 Gy  Narrative The patient presents for routine under treatment assessment.  Weekly radiation treatment to the prostate 14/40. He states that he has nocturia x 6-7. He mentions he has urinary frequency and urgency. He has a little burning when he first starts his stream. His bowels are okay and appetite is good. He denies hematuria. He takes flomax daily energy level okay.  The patient is without complaint. Set-up films were reviewed. The chart was checked.  Physical Findings  weight is 199 lb 11.2 oz (90.583 kg). His oral temperature is 98.6 F (37 C). His blood pressure is 135/72 and his pulse is 69. His respiration is 18. . Weight essentially stable.  No significant changes.  Impression The patient is tolerating radiation.  Plan Continue treatment as planned.         [name] [name], M.D.  This document serves as a record of services personally performed by [name], MD. It was created on his behalf by Lendon Collar, a trained medical scribe. The creation of this record is based on the scribe's personal observations and the provider's statements to them. This document has been checked and approved by the attending provider.

## 2015-05-21 ENCOUNTER — Ambulatory Visit
Admission: RE | Admit: 2015-05-21 | Discharge: 2015-05-21 | Disposition: A | Discharge status: Home / Self Care | Payer: Medicare Other | Source: Ambulatory Visit | Attending: Radiation Oncology | Admitting: Radiation Oncology

## 2015-05-21 DIAGNOSIS — C61 Malignant neoplasm of prostate: Secondary | ICD-10-CM | POA: Diagnosis not present

## 2015-05-21 DIAGNOSIS — Z510 Encounter for antineoplastic radiation therapy: Secondary | ICD-10-CM | POA: Diagnosis not present

## 2015-05-21 DIAGNOSIS — R351 Nocturia: Secondary | ICD-10-CM | POA: Diagnosis not present

## 2015-05-21 DIAGNOSIS — R300 Dysuria: Secondary | ICD-10-CM | POA: Diagnosis not present

## 2015-05-24 ENCOUNTER — Ambulatory Visit
Admission: RE | Admit: 2015-05-24 | Discharge: 2015-05-24 | Disposition: A | Discharge status: Home / Self Care | Payer: Medicare Other | Source: Ambulatory Visit | Attending: Radiation Oncology | Admitting: Radiation Oncology

## 2015-05-24 DIAGNOSIS — Z510 Encounter for antineoplastic radiation therapy: Secondary | ICD-10-CM | POA: Diagnosis not present

## 2015-05-24 DIAGNOSIS — R300 Dysuria: Secondary | ICD-10-CM | POA: Diagnosis not present

## 2015-05-24 DIAGNOSIS — C61 Malignant neoplasm of prostate: Secondary | ICD-10-CM | POA: Diagnosis not present

## 2015-05-24 DIAGNOSIS — R351 Nocturia: Secondary | ICD-10-CM | POA: Diagnosis not present

## 2015-05-25 ENCOUNTER — Ambulatory Visit
Admission: RE | Admit: 2015-05-25 | Discharge: 2015-05-25 | Disposition: A | Discharge status: Home / Self Care | Payer: Medicare Other | Source: Ambulatory Visit | Attending: Radiation Oncology | Admitting: Radiation Oncology

## 2015-05-25 DIAGNOSIS — R300 Dysuria: Secondary | ICD-10-CM | POA: Diagnosis not present

## 2015-05-25 DIAGNOSIS — C61 Malignant neoplasm of prostate: Secondary | ICD-10-CM | POA: Diagnosis not present

## 2015-05-25 DIAGNOSIS — Z510 Encounter for antineoplastic radiation therapy: Secondary | ICD-10-CM | POA: Diagnosis not present

## 2015-05-25 DIAGNOSIS — R351 Nocturia: Secondary | ICD-10-CM | POA: Diagnosis not present

## 2015-05-26 ENCOUNTER — Ambulatory Visit
Admission: RE | Admit: 2015-05-26 | Discharge: 2015-05-26 | Disposition: A | Discharge status: Home / Self Care | Payer: Medicare Other | Source: Ambulatory Visit | Attending: Radiation Oncology | Admitting: Radiation Oncology

## 2015-05-26 DIAGNOSIS — Z510 Encounter for antineoplastic radiation therapy: Secondary | ICD-10-CM | POA: Diagnosis not present

## 2015-05-26 DIAGNOSIS — R300 Dysuria: Secondary | ICD-10-CM | POA: Diagnosis not present

## 2015-05-26 DIAGNOSIS — R351 Nocturia: Secondary | ICD-10-CM | POA: Diagnosis not present

## 2015-05-26 DIAGNOSIS — C61 Malignant neoplasm of prostate: Secondary | ICD-10-CM | POA: Diagnosis not present

## 2015-05-27 ENCOUNTER — Ambulatory Visit
Admission: RE | Admit: 2015-05-27 | Discharge: 2015-05-27 | Disposition: A | Discharge status: Home / Self Care | Payer: Medicare Other | Source: Ambulatory Visit | Attending: Radiation Oncology | Admitting: Radiation Oncology

## 2015-05-27 ENCOUNTER — Encounter: Payer: Self-pay | Admitting: Radiation Oncology

## 2015-05-27 VITALS — BP 157/80 | HR 73 | Resp 16 | Wt 198.6 lb

## 2015-05-27 DIAGNOSIS — R300 Dysuria: Secondary | ICD-10-CM | POA: Diagnosis not present

## 2015-05-27 DIAGNOSIS — C61 Malignant neoplasm of prostate: Secondary | ICD-10-CM | POA: Diagnosis not present

## 2015-05-27 DIAGNOSIS — Z510 Encounter for antineoplastic radiation therapy: Secondary | ICD-10-CM | POA: Diagnosis not present

## 2015-05-27 DIAGNOSIS — R351 Nocturia: Secondary | ICD-10-CM | POA: Diagnosis not present

## 2015-05-27 NOTE — Progress Notes (Signed)
Weight and vitals stable. Denies pain. Reports nocturia x 5 or more. Reports dysuria. Denies hematuria. Describes an intermittent urine stream. Reports occasional difficulty emptying his bladder. Denies diarrhea. Denies fatigue.   BP 157/80 mmHg  Pulse 73  Resp 16  Wt 198 lb 9.6 oz (90.084 kg)  SpO2 100%  Wt Readings from Last 3 Encounters:  05/27/15 198 lb 9.6 oz (90.084 kg)  05/20/15 199 lb 11.2 oz (90.583 kg)  05/14/15 201 lb 9.6 oz (91.445 kg)

## 2015-05-27 NOTE — Progress Notes (Signed)
  Radiation Oncology         [phone]   Name: [name] [name] MRN: [id number]   Date: 05/27/2015  DOB: [birth year]     Weekly Radiation Therapy Management    ICD-9-CM ICD-10-CM   1. Malignant neoplasm of prostate (HCC) 185 C61     Current Dose:  37.05 Gy  Planned Dose:  78 Gy  Narrative The patient presents for routine under treatment assessment.  Weight and vitals stable. Denies pain. Reports nocturia x 5 or more. Reports dysuria. Denies hematuria. Describes an intermittent urine stream. Reports occasional difficulty emptying his bladder. Denies diarrhea. Denies fatigue.  The patient is without complaint. Set-up films were reviewed. The chart was checked.  Physical Findings  weight is 198 lb 9.6 oz (90.084 kg). His blood pressure is 157/80 and his pulse is 73. His respiration is 16 and oxygen saturation is 100%. . Weight essentially stable.  No significant changes.  Impression The patient is tolerating radiation.  Plan Continue treatment as planned. Suggested Advil or Aleve for nocturia.          [name] [name], M.D.  This document serves as a record of services personally performed by [name], MD. It was created on his behalf by Jenell Milliner, a trained medical scribe. The creation of this record is based on the scribe's personal observations and the provider's statements to them. This document has been checked and approved by the attending provider.

## 2015-05-28 ENCOUNTER — Ambulatory Visit
Admission: RE | Admit: 2015-05-28 | Discharge: 2015-05-28 | Disposition: A | Discharge status: Home / Self Care | Payer: Medicare Other | Source: Ambulatory Visit | Attending: Radiation Oncology | Admitting: Radiation Oncology

## 2015-05-28 DIAGNOSIS — C61 Malignant neoplasm of prostate: Secondary | ICD-10-CM | POA: Diagnosis not present

## 2015-05-28 DIAGNOSIS — R351 Nocturia: Secondary | ICD-10-CM | POA: Diagnosis not present

## 2015-05-28 DIAGNOSIS — Z510 Encounter for antineoplastic radiation therapy: Secondary | ICD-10-CM | POA: Diagnosis not present

## 2015-05-28 DIAGNOSIS — R300 Dysuria: Secondary | ICD-10-CM | POA: Diagnosis not present

## 2015-05-31 ENCOUNTER — Ambulatory Visit: Payer: Medicare Other

## 2015-05-31 ENCOUNTER — Ambulatory Visit
Admission: RE | Admit: 2015-05-31 | Discharge: 2015-05-31 | Disposition: A | Discharge status: Home / Self Care | Payer: Medicare Other | Source: Ambulatory Visit | Attending: Radiation Oncology | Admitting: Radiation Oncology

## 2015-06-01 ENCOUNTER — Ambulatory Visit
Admission: RE | Admit: 2015-06-01 | Discharge: 2015-06-01 | Disposition: A | Discharge status: Home / Self Care | Payer: Medicare Other | Source: Ambulatory Visit | Attending: Radiation Oncology | Admitting: Radiation Oncology

## 2015-06-01 DIAGNOSIS — C61 Malignant neoplasm of prostate: Secondary | ICD-10-CM | POA: Diagnosis not present

## 2015-06-01 DIAGNOSIS — R351 Nocturia: Secondary | ICD-10-CM | POA: Diagnosis not present

## 2015-06-01 DIAGNOSIS — R300 Dysuria: Secondary | ICD-10-CM | POA: Diagnosis not present

## 2015-06-01 DIAGNOSIS — H401131 Primary open-angle glaucoma, bilateral, mild stage: Secondary | ICD-10-CM | POA: Diagnosis not present

## 2015-06-01 DIAGNOSIS — Z510 Encounter for antineoplastic radiation therapy: Secondary | ICD-10-CM | POA: Diagnosis not present

## 2015-06-02 ENCOUNTER — Ambulatory Visit
Admission: RE | Admit: 2015-06-02 | Discharge: 2015-06-02 | Disposition: A | Discharge status: Home / Self Care | Payer: Medicare Other | Source: Ambulatory Visit | Attending: Radiation Oncology | Admitting: Radiation Oncology

## 2015-06-02 DIAGNOSIS — R351 Nocturia: Secondary | ICD-10-CM | POA: Diagnosis not present

## 2015-06-02 DIAGNOSIS — C61 Malignant neoplasm of prostate: Secondary | ICD-10-CM | POA: Diagnosis not present

## 2015-06-02 DIAGNOSIS — R300 Dysuria: Secondary | ICD-10-CM | POA: Diagnosis not present

## 2015-06-02 DIAGNOSIS — Z510 Encounter for antineoplastic radiation therapy: Secondary | ICD-10-CM | POA: Diagnosis not present

## 2015-06-03 ENCOUNTER — Ambulatory Visit
Admission: RE | Admit: 2015-06-03 | Discharge: 2015-06-03 | Disposition: A | Discharge status: Home / Self Care | Payer: Medicare Other | Source: Ambulatory Visit | Attending: Radiation Oncology | Admitting: Radiation Oncology

## 2015-06-03 DIAGNOSIS — R300 Dysuria: Secondary | ICD-10-CM | POA: Diagnosis not present

## 2015-06-03 DIAGNOSIS — R351 Nocturia: Secondary | ICD-10-CM | POA: Diagnosis not present

## 2015-06-03 DIAGNOSIS — C61 Malignant neoplasm of prostate: Secondary | ICD-10-CM | POA: Diagnosis not present

## 2015-06-03 DIAGNOSIS — Z510 Encounter for antineoplastic radiation therapy: Secondary | ICD-10-CM | POA: Diagnosis not present

## 2015-06-04 ENCOUNTER — Ambulatory Visit
Admission: RE | Admit: 2015-06-04 | Discharge: 2015-06-04 | Disposition: A | Discharge status: Home / Self Care | Payer: Medicare Other | Source: Ambulatory Visit | Attending: Radiation Oncology | Admitting: Radiation Oncology

## 2015-06-04 VITALS — BP 134/83 | HR 71 | Resp 16 | Wt 198.9 lb

## 2015-06-04 DIAGNOSIS — Z510 Encounter for antineoplastic radiation therapy: Secondary | ICD-10-CM | POA: Diagnosis not present

## 2015-06-04 DIAGNOSIS — R351 Nocturia: Secondary | ICD-10-CM | POA: Diagnosis not present

## 2015-06-04 DIAGNOSIS — C61 Malignant neoplasm of prostate: Secondary | ICD-10-CM

## 2015-06-04 DIAGNOSIS — R300 Dysuria: Secondary | ICD-10-CM | POA: Diagnosis not present

## 2015-06-04 NOTE — Progress Notes (Signed)
Weight and vitals stable. Denies pain. Reports nocturia x 5 or more. Reports dysuria. Denies hematuria. Describes an intermittent urine stream. Reports occasional difficulty emptying his bladder. Denies diarrhea. Denies fatigue.   BP 134/83 mmHg  Pulse 71  Resp 16  Wt 198 lb 14.4 oz (90.22 kg)  SpO2 100% Wt Readings from Last 3 Encounters:  06/04/15 198 lb 14.4 oz (90.22 kg)  05/27/15 198 lb 9.6 oz (90.084 kg)  05/20/15 199 lb 11.2 oz (90.583 kg)

## 2015-06-04 NOTE — Progress Notes (Signed)
  Radiation Oncology         [phone]   Name: [name] [name] MRN: [id number]   Date: 06/04/2015  DOB: [birth year]     Weekly Radiation Therapy Management  No diagnosis found.  Current Dose:  46.8 Gy  Planned Dose:  78 Gy  Narrative The patient presents for routine under treatment assessment.  Patient has completed 24/40 treatments. Weight and vitals stable. Denies pain. Reports nocturia x 5 or more. Reports dysuria/discomfort. Denies hematuria. Describes an intermittent urine stream. Reports occasional difficulty emptying his bladder. Denies diarrhea. Denies fatigue.   Set-up films were reviewed. The chart was checked.  Physical Findings  weight is 198 lb 14.4 oz (90.22 kg). His blood pressure is 134/83 and his pulse is 71. His respiration is 16 and oxygen saturation is 100%.  Weight essentially stable. No significant changes.  Impression The patient is tolerating radiation.  Plan Continue treatment as planned.         [name] [name], M.D.  This document serves as a record of services personally performed by [name], MD. It was created on his behalf by Jenell Milliner, a trained medical scribe. The creation of this record is based on the scribe's personal observations and the provider's statements to them. This document has been checked and approved by the attending provider.

## 2015-06-07 ENCOUNTER — Ambulatory Visit
Admission: RE | Admit: 2015-06-07 | Discharge: 2015-06-07 | Disposition: A | Discharge status: Home / Self Care | Payer: Medicare Other | Source: Ambulatory Visit | Attending: Radiation Oncology | Admitting: Radiation Oncology

## 2015-06-07 DIAGNOSIS — C61 Malignant neoplasm of prostate: Secondary | ICD-10-CM | POA: Diagnosis not present

## 2015-06-07 DIAGNOSIS — Z510 Encounter for antineoplastic radiation therapy: Secondary | ICD-10-CM | POA: Diagnosis not present

## 2015-06-07 DIAGNOSIS — R351 Nocturia: Secondary | ICD-10-CM | POA: Diagnosis not present

## 2015-06-07 DIAGNOSIS — R300 Dysuria: Secondary | ICD-10-CM | POA: Diagnosis not present

## 2015-06-08 ENCOUNTER — Ambulatory Visit
Admission: RE | Admit: 2015-06-08 | Discharge: 2015-06-08 | Disposition: A | Discharge status: Home / Self Care | Payer: Medicare Other | Source: Ambulatory Visit | Attending: Radiation Oncology | Admitting: Radiation Oncology

## 2015-06-08 DIAGNOSIS — Z510 Encounter for antineoplastic radiation therapy: Secondary | ICD-10-CM | POA: Diagnosis not present

## 2015-06-08 DIAGNOSIS — R300 Dysuria: Secondary | ICD-10-CM | POA: Diagnosis not present

## 2015-06-08 DIAGNOSIS — R351 Nocturia: Secondary | ICD-10-CM | POA: Diagnosis not present

## 2015-06-08 DIAGNOSIS — C61 Malignant neoplasm of prostate: Secondary | ICD-10-CM | POA: Diagnosis not present

## 2015-06-09 ENCOUNTER — Ambulatory Visit
Admission: RE | Admit: 2015-06-09 | Discharge: 2015-06-09 | Disposition: A | Discharge status: Home / Self Care | Payer: Medicare Other | Source: Ambulatory Visit | Attending: Radiation Oncology | Admitting: Radiation Oncology

## 2015-06-09 DIAGNOSIS — Z510 Encounter for antineoplastic radiation therapy: Secondary | ICD-10-CM | POA: Diagnosis not present

## 2015-06-09 DIAGNOSIS — C61 Malignant neoplasm of prostate: Secondary | ICD-10-CM | POA: Diagnosis not present

## 2015-06-10 ENCOUNTER — Ambulatory Visit
Admission: RE | Admit: 2015-06-10 | Discharge: 2015-06-10 | Disposition: A | Discharge status: Home / Self Care | Payer: Medicare Other | Source: Ambulatory Visit | Attending: Radiation Oncology | Admitting: Radiation Oncology

## 2015-06-10 DIAGNOSIS — C61 Malignant neoplasm of prostate: Secondary | ICD-10-CM | POA: Diagnosis not present

## 2015-06-10 DIAGNOSIS — Z510 Encounter for antineoplastic radiation therapy: Secondary | ICD-10-CM | POA: Diagnosis not present

## 2015-06-11 ENCOUNTER — Ambulatory Visit
Admission: RE | Admit: 2015-06-11 | Discharge: 2015-06-11 | Disposition: A | Discharge status: Home / Self Care | Payer: Medicare Other | Source: Ambulatory Visit | Attending: Radiation Oncology | Admitting: Radiation Oncology

## 2015-06-11 VITALS — BP 118/71 | HR 60 | Resp 16 | Wt 198.0 lb

## 2015-06-11 DIAGNOSIS — C61 Malignant neoplasm of prostate: Secondary | ICD-10-CM

## 2015-06-11 DIAGNOSIS — Z510 Encounter for antineoplastic radiation therapy: Secondary | ICD-10-CM | POA: Diagnosis not present

## 2015-06-11 NOTE — Progress Notes (Signed)
Weight and vitals stable. Denies pain. Reports nocturia x 5 or more. Reports dysuria. Denies hematuria. Describes an intermittent urine stream. Reports occasional difficulty emptying his bladder. Denies diarrhea. Denies fatigue.   BP 118/71 mmHg  Pulse 60  Resp 16  Wt 198 lb (89.812 kg)  SpO2 100% Wt Readings from Last 3 Encounters:  06/11/15 198 lb (89.812 kg)  06/04/15 198 lb 14.4 oz (90.22 kg)  05/27/15 198 lb 9.6 oz (90.084 kg)

## 2015-06-11 NOTE — Progress Notes (Signed)
  Radiation Oncology         [phone]   Name: [name] [name] MRN: [id number]   Date: 06/11/2015  DOB: [birth year]     Weekly Radiation Therapy Management    ICD-9-CM ICD-10-CM   1. Malignant neoplasm of prostate ([place]) 185 C61     Current Dose:  56.55 Gy  Planned Dose:  78 Gy  Narrative The patient presents for routine under treatment assessment.  Weight and vitals stable. Denies pain. Reports nocturia x 5 or more. Reports dysuria. Denies hematuria. Describes an intermittent urine stream. Reports occasional difficulty emptying his bladder. Denies diarrhea. Denies fatigue. He is taking flomax at bedtime.  Set-up films were reviewed. The chart was checked.  Physical Findings  weight is 198 lb (89.812 kg). His blood pressure is 118/71 and his pulse is 60. His respiration is 16 and oxygen saturation is 100%.  Weight essentially stable. No significant changes.  Impression The patient is tolerating radiation.  Plan Continue treatment as planned.         [name] [name], M.D.  This document serves as a record of services personally performed by [name], MD. It was created on his behalf by Arlyce Harman, a trained medical scribe. The creation of this record is based on the scribe's personal observations and the provider's statements to them. This document has been checked and approved by the attending provider.

## 2015-06-14 ENCOUNTER — Ambulatory Visit
Admission: RE | Admit: 2015-06-14 | Discharge: 2015-06-14 | Disposition: A | Discharge status: Home / Self Care | Payer: Medicare Other | Source: Ambulatory Visit | Attending: Radiation Oncology | Admitting: Radiation Oncology

## 2015-06-14 DIAGNOSIS — C61 Malignant neoplasm of prostate: Secondary | ICD-10-CM | POA: Diagnosis not present

## 2015-06-14 DIAGNOSIS — Z510 Encounter for antineoplastic radiation therapy: Secondary | ICD-10-CM | POA: Diagnosis not present

## 2015-06-15 ENCOUNTER — Ambulatory Visit
Admission: RE | Admit: 2015-06-15 | Discharge: 2015-06-15 | Disposition: A | Discharge status: Home / Self Care | Payer: Medicare Other | Source: Ambulatory Visit | Attending: Radiation Oncology | Admitting: Radiation Oncology

## 2015-06-15 DIAGNOSIS — Z510 Encounter for antineoplastic radiation therapy: Secondary | ICD-10-CM | POA: Diagnosis not present

## 2015-06-15 DIAGNOSIS — C61 Malignant neoplasm of prostate: Secondary | ICD-10-CM | POA: Diagnosis not present

## 2015-06-16 ENCOUNTER — Ambulatory Visit
Admission: RE | Admit: 2015-06-16 | Discharge: 2015-06-16 | Disposition: A | Discharge status: Home / Self Care | Payer: Medicare Other | Source: Ambulatory Visit | Attending: Radiation Oncology | Admitting: Radiation Oncology

## 2015-06-16 DIAGNOSIS — C61 Malignant neoplasm of prostate: Secondary | ICD-10-CM | POA: Diagnosis not present

## 2015-06-16 DIAGNOSIS — Z510 Encounter for antineoplastic radiation therapy: Secondary | ICD-10-CM | POA: Diagnosis not present

## 2015-06-17 ENCOUNTER — Encounter: Payer: Self-pay | Admitting: Radiation Oncology

## 2015-06-17 ENCOUNTER — Ambulatory Visit
Admission: RE | Admit: 2015-06-17 | Discharge: 2015-06-17 | Disposition: A | Discharge status: Home / Self Care | Payer: Medicare Other | Source: Ambulatory Visit | Attending: Radiation Oncology | Admitting: Radiation Oncology

## 2015-06-17 VITALS — BP 131/71 | HR 77 | Temp 98.4°F | Resp 20 | Wt 197.6 lb

## 2015-06-17 DIAGNOSIS — Z510 Encounter for antineoplastic radiation therapy: Secondary | ICD-10-CM | POA: Diagnosis not present

## 2015-06-17 DIAGNOSIS — C61 Malignant neoplasm of prostate: Secondary | ICD-10-CM | POA: Diagnosis not present

## 2015-06-17 NOTE — Progress Notes (Deleted)
Weight and vitals stable. Denies pain. Reports nocturia x 5 or more. Reports dysuria. Denies hematuria. Describes an intermittent urine stream. Reports occasional difficulty emptying his bladder. Denies diarrhea. Denies fatigue.

## 2015-06-17 NOTE — Progress Notes (Signed)
  Radiation Oncology         [phone]   Name: [name] [name] MRN: [id number]   Date: 06/17/2015  DOB: October 26, 1950   Weekly Radiation Therapy Management    ICD-9-CM ICD-10-CM   1. Malignant neoplasm of prostate (HCC) 185 C61     Current Dose: 64.35 Gy  Planned Dose:  78 Gy  Narrative The patient presents for routine under treatment assessment. Weekly rad txs prostate=33/40 completed, dysuria mild, increased nocturia 5-6x, urgency,frequency, takes flomax ddaily , appetite good, bowels regular, drinks plenty water, no fatigue no pain.  The patient is without complaint. Set-up films were reviewed. The chart was checked.  Physical Findings  weight is 197 lb 9.6 oz (89.631 kg). His oral temperature is 98.4 F (36.9 C). His blood pressure is 131/71 and his pulse is 77. His respiration is 20. . Weight essentially stable.  No significant changes.  Impression The patient is tolerating radiation.  Plan Continue treatment as planned.    [name] [name], M.D.  This document serves as a record of services personally performed by [name] Pita, MD. It was created on his behalf by [name] [name], a trained medical scribe. The creation of this record is based on the scribe's personal observations and the provider's statements to them. This document has been checked and approved by the attending provider.

## 2015-06-17 NOTE — Progress Notes (Signed)
Weekly rad txs prostate=33/40 completed, dysuria mild, increased nocturia 5-6x, urgency,frequency, takes flomax ddaily , appetite good, bowels regular, drinks plenty water, no fatigue no pain 8:33 AM BP 131/71 mmHg  Pulse 77  Temp(Src) 98.4 F (36.9 C) (Oral)  Resp 20  Wt 197 lb 9.6 oz (89.631 kg)  Wt Readings from Last 3 Encounters:  06/17/15 197 lb 9.6 oz (89.631 kg)  06/11/15 198 lb (89.812 kg)  06/04/15 198 lb 14.4 oz (90.22 kg)

## 2015-06-18 ENCOUNTER — Ambulatory Visit
Admission: RE | Admit: 2015-06-18 | Discharge: 2015-06-18 | Disposition: A | Discharge status: Home / Self Care | Payer: Medicare Other | Source: Ambulatory Visit | Attending: Radiation Oncology | Admitting: Radiation Oncology

## 2015-06-18 DIAGNOSIS — C61 Malignant neoplasm of prostate: Secondary | ICD-10-CM | POA: Diagnosis not present

## 2015-06-18 DIAGNOSIS — Z510 Encounter for antineoplastic radiation therapy: Secondary | ICD-10-CM | POA: Diagnosis not present

## 2015-06-21 ENCOUNTER — Ambulatory Visit
Admission: RE | Admit: 2015-06-21 | Discharge: 2015-06-21 | Disposition: A | Discharge status: Home / Self Care | Payer: Medicare Other | Source: Ambulatory Visit | Attending: Radiation Oncology | Admitting: Radiation Oncology

## 2015-06-21 DIAGNOSIS — Z510 Encounter for antineoplastic radiation therapy: Secondary | ICD-10-CM | POA: Diagnosis not present

## 2015-06-21 DIAGNOSIS — C61 Malignant neoplasm of prostate: Secondary | ICD-10-CM | POA: Diagnosis not present

## 2015-06-22 ENCOUNTER — Ambulatory Visit
Admission: RE | Admit: 2015-06-22 | Discharge: 2015-06-22 | Disposition: A | Discharge status: Home / Self Care | Payer: Medicare Other | Source: Ambulatory Visit | Attending: Radiation Oncology | Admitting: Radiation Oncology

## 2015-06-22 DIAGNOSIS — C61 Malignant neoplasm of prostate: Secondary | ICD-10-CM | POA: Diagnosis not present

## 2015-06-22 DIAGNOSIS — Z510 Encounter for antineoplastic radiation therapy: Secondary | ICD-10-CM | POA: Diagnosis not present

## 2015-06-23 ENCOUNTER — Ambulatory Visit
Admission: RE | Admit: 2015-06-23 | Discharge: 2015-06-23 | Disposition: A | Discharge status: Home / Self Care | Payer: Medicare Other | Source: Ambulatory Visit | Attending: Radiation Oncology | Admitting: Radiation Oncology

## 2015-06-23 DIAGNOSIS — C61 Malignant neoplasm of prostate: Secondary | ICD-10-CM | POA: Diagnosis not present

## 2015-06-23 DIAGNOSIS — Z510 Encounter for antineoplastic radiation therapy: Secondary | ICD-10-CM | POA: Diagnosis not present

## 2015-06-24 ENCOUNTER — Ambulatory Visit
Admission: RE | Admit: 2015-06-24 | Discharge: 2015-06-24 | Disposition: A | Discharge status: Home / Self Care | Payer: Medicare Other | Source: Ambulatory Visit | Attending: Radiation Oncology | Admitting: Radiation Oncology

## 2015-06-24 ENCOUNTER — Encounter: Payer: Self-pay | Admitting: Radiation Oncology

## 2015-06-24 VITALS — BP 139/81 | HR 64 | Temp 97.6°F | Ht 67.0 in | Wt 194.1 lb

## 2015-06-24 DIAGNOSIS — C61 Malignant neoplasm of prostate: Secondary | ICD-10-CM | POA: Diagnosis not present

## 2015-06-24 DIAGNOSIS — Z510 Encounter for antineoplastic radiation therapy: Secondary | ICD-10-CM | POA: Diagnosis not present

## 2015-06-24 NOTE — Progress Notes (Signed)
[name] [name] presents for his 38th fraction of radiation to his Prostate. He denies any pain at this. He reports urinary frequency which he states is not new, and reports he is up five times nightly to void.  He does admit to some burning with urination which started during his treatments. He states at times he feels like he has not emptied his bladder completely after he has urinated. He denies any incontinence of urine. He also reports urgency at times to void. He reports regular bowel movements daily, and occasionally will use miralax to help with constipation. He has no other concerns at this time. He was given a follow up appointment to see Dr. [name] in one month.   BP 139/81 mmHg  Pulse 64  Temp(Src) 97.6 F (36.4 C)  Ht 5\' 7"  (1.702 m)  Wt 194 lb 1.6 oz (88.043 kg)  BMI 30.39 kg/m2  SpO2 100%   Wt Readings from Last 3 Encounters:  06/24/15 194 lb 1.6 oz (88.043 kg)  06/17/15 197 lb 9.6 oz (89.631 kg)  06/11/15 198 lb (89.812 kg)

## 2015-06-24 NOTE — Progress Notes (Signed)
  Radiation Oncology         [phone]   Name: [name] [name] MRN: [id number]   Date: 06/24/2015  DOB: 09/11/1950   Weekly Radiation Therapy Management    ICD-9-CM ICD-10-CM   1. Malignant neoplasm of prostate (HCC) 185 C61     Current Dose: 74.1 Gy  Planned Dose:  78 Gy  Narrative The patient presents for routine under treatment assessment.  He presents for his 38th fraction of radiation to his prostate. He denies any pain at this time. He reports urinary frequency, which he states is not new, and reports he is up five times nightly to void. He does admit to some burning with urination which started during his treatments. He states at times he feels like he has not emptied his bladder completely after he has urinated. He denies any incontinence of urine. He also reports urgency at times to void. He reports regular bowel movements daily, and occasionally will use miralax to help with constipation. He has no other concerns at this time.  Set-up films were reviewed. The chart was checked.  Physical Findings  height is 5\' 7"  (1.702 m) and weight is 194 lb 1.6 oz (88.043 kg). His temperature is 97.6 F (36.4 C). His blood pressure is 139/81 and his pulse is 64. His oxygen saturation is 100%.  Weight essentially stable.  No significant changes.  Impression The patient is tolerating radiation.  Plan Continue treatment as planned. He was given a follow up appointment card to return in one month.    [name] [name], M.D.  This document serves as a record of services personally performed by [name], MD. It was created on his behalf by Darcus Austin, a trained medical scribe. The creation of this record is based on the scribe's personal observations and the provider's statements to them. This document has been checked and approved by the attending provider.

## 2015-06-25 ENCOUNTER — Ambulatory Visit
Admission: RE | Admit: 2015-06-25 | Discharge: 2015-06-25 | Disposition: A | Discharge status: Home / Self Care | Payer: Medicare Other | Source: Ambulatory Visit | Attending: Radiation Oncology | Admitting: Radiation Oncology

## 2015-06-25 ENCOUNTER — Ambulatory Visit: Payer: Medicare Other

## 2015-06-25 DIAGNOSIS — C61 Malignant neoplasm of prostate: Secondary | ICD-10-CM | POA: Diagnosis not present

## 2015-06-25 DIAGNOSIS — Z510 Encounter for antineoplastic radiation therapy: Secondary | ICD-10-CM | POA: Diagnosis not present

## 2015-06-28 ENCOUNTER — Ambulatory Visit
Admission: RE | Admit: 2015-06-28 | Discharge: 2015-06-28 | Disposition: A | Discharge status: Home / Self Care | Payer: Medicare Other | Source: Ambulatory Visit | Attending: Radiation Oncology | Admitting: Radiation Oncology

## 2015-06-28 ENCOUNTER — Encounter: Payer: Self-pay | Admitting: Radiation Oncology

## 2015-06-28 DIAGNOSIS — C61 Malignant neoplasm of prostate: Secondary | ICD-10-CM | POA: Diagnosis not present

## 2015-06-28 DIAGNOSIS — Z510 Encounter for antineoplastic radiation therapy: Secondary | ICD-10-CM | POA: Diagnosis not present

## 2015-07-05 NOTE — Progress Notes (Signed)
  Radiation Oncology         [phone] ________________________________  Name: [name] [name] MRN: [id number]  Date: 06/28/2015  DOB: 03-Mar-1951  End of Treatment Note  Diagnosis:  65 yo man with stage T1c adenocarcinoma of the prostate with a Gleason Score of 4 + 3 and PSA of 5.57    Indication for treatment:  Curative, Definitive Radiotherapy       Radiation treatment dates:   04/29/2015-06/28/2015  Site/dose:   The prostate was treated to 78 Gy in 40 fractions of 1.95 Gy  Beams/energy:   The patient was treated with IMRT using volumetric arc therapy delivering 6 MV X-rays to clockwise and counterclockwise circumferential arcs with a 90 degree collimator offset to avoid dose scalloping.  Image guidance was performed with daily cone beam CT prior to each fraction to align to gold markers in the prostate and assure proper bladder and rectal fill volumes.  Immobilization was achieved with [employer] custom mold.  Narrative: The patient tolerated radiation treatment relatively well.   The patient experienced some minor urinary irritation and modest fatigue.   Plan: The patient has completed radiation treatment. He will return to radiation oncology clinic for routine followup in one month. I advised him to call or return sooner if he has any questions or concerns related to his recovery or treatment. ________________________________  [name]. [name], M.D.

## 2015-07-13 DIAGNOSIS — H11153 Pinguecula, bilateral: Secondary | ICD-10-CM | POA: Diagnosis not present

## 2015-07-13 DIAGNOSIS — H18413 Arcus senilis, bilateral: Secondary | ICD-10-CM | POA: Diagnosis not present

## 2015-07-13 DIAGNOSIS — H16143 Punctate keratitis, bilateral: Secondary | ICD-10-CM | POA: Diagnosis not present

## 2015-07-13 DIAGNOSIS — H04123 Dry eye syndrome of bilateral lacrimal glands: Secondary | ICD-10-CM | POA: Diagnosis not present

## 2015-07-13 DIAGNOSIS — H11423 Conjunctival edema, bilateral: Secondary | ICD-10-CM | POA: Diagnosis not present

## 2015-07-13 DIAGNOSIS — H2513 Age-related nuclear cataract, bilateral: Secondary | ICD-10-CM | POA: Diagnosis not present

## 2015-07-13 DIAGNOSIS — H02403 Unspecified ptosis of bilateral eyelids: Secondary | ICD-10-CM | POA: Diagnosis not present

## 2015-07-13 DIAGNOSIS — H401131 Primary open-angle glaucoma, bilateral, mild stage: Secondary | ICD-10-CM | POA: Diagnosis not present

## 2015-07-16 ENCOUNTER — Other Ambulatory Visit: Payer: Self-pay | Admitting: Internal Medicine

## 2015-07-26 ENCOUNTER — Telehealth: Payer: Self-pay | Admitting: *Deleted

## 2015-07-26 NOTE — Telephone Encounter (Signed)
CALLED PATIENT TO ASK QUESTION, LVM FOR A RETURN CALL 

## 2015-07-28 ENCOUNTER — Ambulatory Visit (INDEPENDENT_AMBULATORY_CARE_PROVIDER_SITE_OTHER): Payer: Medicare Other

## 2015-07-28 ENCOUNTER — Ambulatory Visit (INDEPENDENT_AMBULATORY_CARE_PROVIDER_SITE_OTHER): Payer: Medicare Other | Admitting: Physician Assistant

## 2015-07-28 VITALS — BP 122/78 | HR 75 | Temp 97.9°F | Resp 16 | Ht 67.0 in | Wt 199.0 lb

## 2015-07-28 DIAGNOSIS — M5030 Other cervical disc degeneration, unspecified cervical region: Secondary | ICD-10-CM | POA: Diagnosis not present

## 2015-07-28 DIAGNOSIS — M542 Cervicalgia: Secondary | ICD-10-CM

## 2015-07-28 DIAGNOSIS — M47812 Spondylosis without myelopathy or radiculopathy, cervical region: Secondary | ICD-10-CM

## 2015-07-28 MED ORDER — MELOXICAM 15 MG PO TABS: 7.5000 mg | ORAL_TABLET | Freq: Every day | ORAL | Status: AC

## 2015-07-28 NOTE — Patient Instructions (Addendum)
Take meloxicam 7.5 to 15 mg daily for neck pain.  Take Tylenol 650 every 6-8 hours daily.      IF you received an x-ray today, you will receive an invoice from [facility]. Please contact [facility] at [phone] with questions or concerns regarding your invoice.   IF you received labwork today, you will receive an invoice from [employer]. Please contact [facility] at [phone] with questions or concerns regarding your invoice.   Our billing staff will not be able to assist you with questions regarding bills from these companies.  You will be contacted with the lab results as soon as they are available. The fastest way to get your results is to activate your My Chart account. Instructions are located on the last page of this paperwork. If you have not heard from [place] regarding the results in 2 weeks, please contact this office.

## 2015-07-28 NOTE — Progress Notes (Signed)
07/28/2015 10:37 AM   DOB: [birth year] / MRN: [id number]  SUBJECTIVE:  [name] [name] is a 65 y.o. male presenting for left sided neck pain that began two months ago. He denies weakness, left arm paresthesia, and change in left hand dexterity.  He denies a history of arthritis. He works as a retired [employer] manager.    He is allergic to celecoxib; crestor; detrol; and ditropan.   He  has a past medical history of GERD (07/31/2007); HYPERLIPIDEMIA (01/04/2007); HYPERTENSION (01/04/2007); OVERACTIVE BLADDER (03/06/2007); BENIGN PROSTATIC HYPERTROPHY (05/06/2010); COMMON MIGRAINE (07/31/2007); and Prostate cancer ([place]) (02/04/15).    He  reports that he has never smoked. He does not have any smokeless tobacco history on file. He reports that he drinks alcohol. He  has no sexual activity history on file. The patient  has no past surgical history on file.  His family history includes Lung cancer (age of onset: 69) in his father.  Review of Systems  Constitutional: Negative for fever and chills.  Respiratory: Negative for cough.   Cardiovascular: Negative for chest pain.  Gastrointestinal: Negative for nausea.  Musculoskeletal: Positive for myalgias and neck pain.  Skin: Negative for rash.  Neurological: Negative for dizziness and headaches.    Problem list and medications reviewed and updated by myself where necessary, and exist elsewhere in the encounter.   OBJECTIVE:  BP 122/78 mmHg  Pulse 75  Temp(Src) 97.9 F (36.6 C) (Oral)  Resp 16  Ht 5\' 7"  (1.702 m)  Wt 199 lb (90.266 kg)  BMI 31.16 kg/m2  SpO2 98%  Physical Exam  Constitutional: He is oriented to person, place, and time.  Cardiovascular: Normal rate and regular rhythm.   Pulmonary/Chest: Effort normal and breath sounds normal.  Musculoskeletal: Normal range of motion.  Neurological: He is alert and oriented to person, place, and time. He has normal strength. No cranial nerve deficit or sensory deficit. He displays a negative Romberg  sign.  Reflex Scores:      Brachioradialis reflexes are 2+ on the right side and 2+ on the left side.      Patellar reflexes are 2+ on the right side and 2+ on the left side.      Achilles reflexes are 2+ on the right side and 2+ on the left side. Skin: Skin is warm and dry.  Vitals reviewed.   No results found for this or any previous visit (from the past 72 hour(s)).  Dg Cervical Spine Complete  07/28/2015  CLINICAL DATA:  Left-sided neck pain for the past 2 months without known injury. No radicular symptoms reported. EXAM: CERVICAL SPINE - COMPLETE 4+ VIEW COMPARISON:  None in PACs FINDINGS: The cervical vertebral bodies are preserved in height. The disc space heights are reasonably well-maintained. There small anterior endplate osteophytes from C3 through C6. There is no perched facet or spinous process fracture. The oblique views reveal no high-grade bony encroachment upon the neural foramina. The odontoid is intact. The prevertebral soft tissue spaces appear normal. IMPRESSION: There is very mild degenerative disc and facet joint change at multiple levels. There is no compression fracture nor other significant bony abnormality. Electronically Signed   By: David  Martinique M.D.   On: 07/28/2015 10:14   Lab Results  Component Value Date   CREATININE 1.01 03/26/2015     ASSESSMENT AND PLAN  [name] [name] was seen today for neck pain.  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  He neuro exam is reassuring.  Will treat symptomatically for now. Will refill meloxicam as needed.    -     meloxicam (MOBIC) 15 MG tablet; Take 0.5-1 tablets (7.5-15 mg total) by mouth daily. Take with food. Do not take Ibuprofen, Goody's, or Aleve while taking this medication.    The patient was advised to call or return to clinic if he does not see an improvement in symptoms or to seek the care of the closest emergency  department if he worsens with the above plan.   Philis Fendt, MHS, PA-C [facility] 07/28/2015 10:37 AM

## 2015-08-02 DIAGNOSIS — H401131 Primary open-angle glaucoma, bilateral, mild stage: Secondary | ICD-10-CM | POA: Diagnosis not present

## 2015-08-02 DIAGNOSIS — H18413 Arcus senilis, bilateral: Secondary | ICD-10-CM | POA: Diagnosis not present

## 2015-08-02 DIAGNOSIS — H2513 Age-related nuclear cataract, bilateral: Secondary | ICD-10-CM | POA: Diagnosis not present

## 2015-08-02 DIAGNOSIS — H11153 Pinguecula, bilateral: Secondary | ICD-10-CM | POA: Diagnosis not present

## 2015-08-02 DIAGNOSIS — H11423 Conjunctival edema, bilateral: Secondary | ICD-10-CM | POA: Diagnosis not present

## 2015-08-02 DIAGNOSIS — H04123 Dry eye syndrome of bilateral lacrimal glands: Secondary | ICD-10-CM | POA: Diagnosis not present

## 2015-08-05 ENCOUNTER — Ambulatory Visit
Admission: RE | Admit: 2015-08-05 | Discharge: 2015-08-05 | Disposition: A | Discharge status: Home / Self Care | Payer: Medicare Other | Source: Ambulatory Visit | Attending: Radiation Oncology | Admitting: Radiation Oncology

## 2015-08-05 ENCOUNTER — Encounter: Payer: Self-pay | Admitting: Radiation Oncology

## 2015-08-05 ENCOUNTER — Telehealth: Payer: Self-pay | Admitting: *Deleted

## 2015-08-05 VITALS — BP 140/76 | HR 68 | Temp 97.6°F | Ht 67.0 in | Wt 201.9 lb

## 2015-08-05 DIAGNOSIS — N410 Acute prostatitis: Secondary | ICD-10-CM

## 2015-08-05 DIAGNOSIS — C61 Malignant neoplasm of prostate: Secondary | ICD-10-CM

## 2015-08-05 NOTE — Progress Notes (Signed)
[name] [name] is here for follow up of radiation completed 06/28/15 to his Prostate. He denies pain, but reports some fatigue since his treatments completed. He reports urinary frequency during the day and at night. He states he is getting up about 5 times nightly. He feels like he is not emptying his bladder completely when he void, and he reports occasional burning with urination. He denies any bowel problems. He has no other concerns at this time.   BP 140/76 mmHg  Pulse 68  Temp(Src) 97.6 F (36.4 C)  Ht 5\' 7"  (1.702 m)  Wt 201 lb 14.4 oz (91.581 kg)  BMI 31.61 kg/m2  SpO2 100%   Wt Readings from Last 3 Encounters:  08/05/15 201 lb 14.4 oz (91.581 kg)  07/28/15 199 lb (90.266 kg)  06/24/15 194 lb 1.6 oz (88.043 kg)

## 2015-08-05 NOTE — Progress Notes (Signed)
Radiation Oncology         [phone] ________________________________  Name: [name] [name] MRN: [id number]  Date: 08/05/2015  DOB: [birth year]  Follow-Up Visit Note  CC: [name], MD  Carolan Clines, MD  Diagnosis:  Stage T1c adenocarcinoma of the prostate, Gleason score 4+3, PSA of 5.57 s/p 78 Gy to the prostate.  Interval Since Last Radiation:  1  months The prostate was treated to 78 Gy in 40 fractions of 1.95 Gy from 04/29/2015-06/28/2015  Narrative:  The patient returns today for routine follow-up.  Of note the patient did experience multiple episodes of nocturia during his radiation treatment, and had been treated with Flomax prior to initiating radiation treatment with Dr. Gaynelle Arabian.  On review of systems, he describes improvement of his dysuria, and states that urinary urgency is about the same as it was prior to initiating radiation. He denies any bowel dysfunction. He is not experiencing any abdominal pain, nausea, vomiting, fevers or chills. He continues to have some fatigue but states that he is able to exercise and enjoys walking on a treadmill. He denies any chest pain or shortness of breath. A complete review of systems is obtained and is otherwise negative.                          ALLERGIES:  is allergic to celecoxib; crestor; detrol; and ditropan.  Meds: Current Outpatient Prescriptions  Medication Sig Dispense Refill  . amLODipine (NORVASC) 10 MG tablet TAKE 1 TABLET EVERY DAY 90 tablet 3  . aspirin 81 MG tablet Take 81 mg by mouth daily.     Marland Kitchen atorvastatin (LIPITOR) 10 MG tablet TAKE 1 TABLET EVERY DAY 90 tablet 3  . benazepril (LOTENSIN) 20 MG tablet TAKE 1 TABLET EVERY DAY 90 tablet 3  . polyethylene glycol (MIRALAX / GLYCOLAX) packet Take 17 g by mouth as needed.     . Tamsulosin HCl (FLOMAX) 0.4 MG CAPS Take 1 capsule (0.4 mg total) by mouth daily. 30 capsule 11  . diazepam (VALIUM) 10 MG tablet Reported on 08/05/2015    . fesoterodine (TOVIAZ) 4 MG  TB24 tablet Take 1 tablet (4 mg total) by mouth daily. (Patient not taking: Reported on 06/24/2015) 90 tablet 3  . meloxicam (MOBIC) 15 MG tablet Take 0.5-1 tablets (7.5-15 mg total) by mouth daily. Take with food. Do not take Ibuprofen, Goody's, or Aleve while taking this medication. (Patient not taking: Reported on 08/05/2015) 30 tablet 0   No current facility-administered medications for this encounter.    Physical Findings:  height is 5\' 7"  (1.702 m) and weight is 201 lb 14.4 oz (91.581 kg). His temperature is 97.6 F (36.4 C). His blood pressure is 140/76 and his pulse is 68. His oxygen saturation is 100%.   Pain scale 0/10 In general this is a well appearing African-American male in no acute distress. He's alert and oriented x4 and appropriate throughout the examination. Cardiopulmonary assessment is negative for acute distress and he exhibits normal effort.    Lab Findings: Lab Results  Component Value Date   WBC 8.2 03/26/2015   WBC 8.1 03/15/2012   HGB 15.1 03/26/2015   HGB 15.2 03/15/2012   HCT 45.5 03/26/2015   HCT 46.7 03/15/2012   PLT 305.0 03/26/2015    Lab Results  Component Value Date   NA 140 03/26/2015   K 4.1 03/26/2015   CO2 28 03/26/2015   GLUCOSE 103* 03/26/2015   BUN 12 03/26/2015  CREATININE 1.01 03/26/2015   CREATININE 0.89 03/15/2012   BILITOT 1.3* 03/26/2015   ALKPHOS 67 03/26/2015   AST 25 03/26/2015   ALT 27 03/26/2015   PROT 7.7 03/26/2015   ALBUMIN 4.6 03/26/2015   CALCIUM 9.6 03/26/2015    Radiographic Findings: Dg Cervical Spine Complete  07/28/2015  CLINICAL DATA:  Left-sided neck pain for the past 2 months without known injury. No radicular symptoms reported. EXAM: CERVICAL SPINE - COMPLETE 4+ VIEW COMPARISON:  None in PACs FINDINGS: The cervical vertebral bodies are preserved in height. The disc space heights are reasonably well-maintained. There small anterior endplate osteophytes from C3 through C6. There is no perched facet or spinous  process fracture. The oblique views reveal no high-grade bony encroachment upon the neural foramina. The odontoid is intact. The prevertebral soft tissue spaces appear normal. IMPRESSION: There is very mild degenerative disc and facet joint change at multiple levels. There is no compression fracture nor other significant bony abnormality. Electronically Signed   By: David  Martinique M.D.   On: 07/28/2015 10:14    Impression/Plan:  1. Stage T1c adenocarcinoma of the prostate, Gleason score 4+3, PSA of 5.57 s/p 78 Gy to the prostate.The patient appears to be doing well following radiation treatment, he continues to have some improvement of his symptoms but also experiences symptoms that were noted prior to initiating his radiation treatment. We discussed the rationale for moving forward with follow-up under the care of Dr. Gaynelle Arabian, and we'll plan to release him to Dr. Arlyn Leak care approximately 2 months time. A referral was placed for this. The patient states agreement and understanding and is encouraged to call us if he has any questions or concerns regarding his previous treatment. We anticipate that his symptoms may continue to improve though his symptoms that were initially present at the time of beginning radiation may warrant additional evaluation by Dr. Gaynelle Arabian. 2.  Survivorship. I've spoken with patient regarding the importance of survivorship, and he is interested in meeting with her nurse practitioner Mike Craze. A referral was placed to that he can meet with her in approximately one month's time. He states agreement and understanding of this plan.      Carola Rhine, PAC

## 2015-08-05 NOTE — Telephone Encounter (Signed)
CALLED PATIENT TO INFORM OF FU WITH DIANE WARDEN, PA FOR DR. TANNENBAUM ON  10-05-15- ARRIVAL TIME - 10:45 AM, SPOKE WITH [name] [name] AND HE IS AWARE OF THIS APPT.

## 2015-08-05 NOTE — Addendum Note (Signed)
Encounter addended by: Benn Moulder, RN on: 08/05/2015  6:53 PM<BR>     Documentation filed: Clinical Notes

## 2015-08-18 ENCOUNTER — Telehealth: Payer: Self-pay | Admitting: Adult Health

## 2015-08-18 NOTE — Telephone Encounter (Signed)
I attempted to reach Mr. Monette  to get his survivorship appt scheduled since the patient has completed treatment for prostate cancer, as requested by Shona Simpson, PA-C.   I left a voicemail for Mr. Shropshire letting him know that I would mail him a copy of his appts to his home and encouraged him to call me with any questions.   I have mailed him a letter, along with a calendar of his appt, as well as my business card and the "Life After Cancer for Every Survivor" brochure. I look forward to participating in his care!  Mike Craze, NP [place] [phone]

## 2015-09-08 ENCOUNTER — Encounter: Payer: Medicare Other | Admitting: Adult Health

## 2015-09-20 ENCOUNTER — Encounter: Payer: Self-pay | Admitting: Adult Health

## 2015-09-20 NOTE — Progress Notes (Signed)
Survivorship Care Plan and survivorship resources packet mailed to patient d/t to no-show for visit scheduled on 09/08/15.  Letter included in mailing with encouragement for patient to reach out to me with any questions or concerns.   Mike Craze, NP [place] [phone]

## 2015-09-23 ENCOUNTER — Encounter: Payer: Self-pay | Admitting: Internal Medicine

## 2015-09-23 ENCOUNTER — Ambulatory Visit (INDEPENDENT_AMBULATORY_CARE_PROVIDER_SITE_OTHER): Payer: Medicare Other | Admitting: Internal Medicine

## 2015-09-23 VITALS — BP 122/78 | HR 72 | Resp 20 | Wt 192.0 lb

## 2015-09-23 DIAGNOSIS — Z23 Encounter for immunization: Secondary | ICD-10-CM | POA: Diagnosis not present

## 2015-09-23 DIAGNOSIS — C61 Malignant neoplasm of prostate: Secondary | ICD-10-CM | POA: Diagnosis not present

## 2015-09-23 DIAGNOSIS — I10 Essential (primary) hypertension: Secondary | ICD-10-CM | POA: Diagnosis not present

## 2015-09-23 DIAGNOSIS — R531 Weakness: Secondary | ICD-10-CM | POA: Diagnosis not present

## 2015-09-23 DIAGNOSIS — E785 Hyperlipidemia, unspecified: Secondary | ICD-10-CM

## 2015-09-23 NOTE — Assessment & Plan Note (Signed)
stable overall by history and exam, recent data reviewed with pt, and pt to continue medical treatment as before,  to f/u any worsening symptoms or concerns BP Readings from Last 3 Encounters:  09/23/15 122/78  08/05/15 140/76  07/28/15 122/78

## 2015-09-23 NOTE — Assessment & Plan Note (Signed)
stable overall by history and exam, recent data reviewed with pt, and pt to continue medical treatment as before,  to f/u any worsening symptoms or concerns Lab Results  Component Value Date   LDLCALC 102* 03/26/2015

## 2015-09-23 NOTE — Patient Instructions (Signed)
You had the Pneumovax pneumonia shot today  Please continue all other medications as before, and refills have been done if requested.  Please have the pharmacy call with any other refills you may need.  Please keep your appointments with your specialists as you may have planned  Please make sure to follow up with Urology as planned for the PSA and exam  Please return in 6 months, or sooner if needed

## 2015-09-23 NOTE — Progress Notes (Signed)
Subjective:    Patient ID: [name] [name], male    DOB: June 30, 1950, 65 y.o.   MRN: [id number]  HPI  Here to f/u; overall doing ok,  Pt denies chest pain, increasing sob or doe, wheezing, orthopnea, PND, increased LE swelling, palpitations, dizziness or syncope.  Pt denies new neurological symptoms such as new headache, or facial or extremity weakness or numbness.  Pt denies polydipsia, polyuria, or low sugar episode.   Pt denies new neurological symptoms such as new headache, or facial or extremity weakness or numbness.   Pt states overall good compliance with meds, mostly trying to follow appropriate diet, with wt overall stable,  but little exercise however. Plans to be more active.  Now s/p XRT for prostate ca, has f/u with urology planned at 2 mo.  Overall weakness has actually improved, Does c/o ongoing fatigue, mild but denies signficant daytime hypersomnolence, and again overall better than last viist.  Past Medical History  Diagnosis Date  . GERD 07/31/2007  . HYPERLIPIDEMIA 01/04/2007  . HYPERTENSION 01/04/2007  . OVERACTIVE BLADDER 03/06/2007  . BENIGN PROSTATIC HYPERTROPHY 05/06/2010  . COMMON MIGRAINE 07/31/2007  . Prostate cancer ([place]) 02/04/15   No past surgical history on file.  reports that he has never smoked. He does not have any smokeless tobacco history on file. He reports that he drinks alcohol. His drug history is not on file. family history includes Lung cancer (age of onset: 30) in his father. Allergies  Allergen Reactions  . Celecoxib Other (See Comments)    Stroke like symptoms  . Crestor [Rosuvastatin]     weakness  . Detrol [Tolterodine] Other (See Comments)    headache  . Ditropan [Oxybutynin] Other (See Comments)    Dry mouth   Current Outpatient Prescriptions on File Prior to Visit  Medication Sig Dispense Refill  . amLODipine (NORVASC) 10 MG tablet TAKE 1 TABLET EVERY DAY 90 tablet 3  . aspirin 81 MG tablet Take 81 mg by mouth daily.     Marland Kitchen atorvastatin  (LIPITOR) 10 MG tablet TAKE 1 TABLET EVERY DAY 90 tablet 3  . benazepril (LOTENSIN) 20 MG tablet TAKE 1 TABLET EVERY DAY 90 tablet 3  . diazepam (VALIUM) 10 MG tablet Reported on 08/05/2015    . fesoterodine (TOVIAZ) 4 MG TB24 tablet Take 1 tablet (4 mg total) by mouth daily. 90 tablet 3  . polyethylene glycol (MIRALAX / GLYCOLAX) packet Take 17 g by mouth as needed.     . Tamsulosin HCl (FLOMAX) 0.4 MG CAPS Take 1 capsule (0.4 mg total) by mouth daily. 30 capsule 11   No current facility-administered medications on file prior to visit.   Review of Systems  Constitutional: Negative for unusual diaphoresis or night sweats HENT: Negative for ear swelling or discharge Eyes: Negative for worsening visual haziness  Respiratory: Negative for choking and stridor.   Gastrointestinal: Negative for distension or worsening eructation Genitourinary: Negative for retention or change in urine volume.  Musculoskeletal: Negative for other MSK pain or swelling Skin: Negative for color change and worsening wound Neurological: Negative for tremors and numbness other than noted  Psychiatric/Behavioral: Negative for decreased concentration or agitation other than above       Objective:   Physical Exam BP 122/78 mmHg  Pulse 72  Resp 20  Wt 192 lb (87.091 kg)  SpO2 98% VS noted,  Constitutional: Pt appears in no apparent distress HENT: Head: NCAT.  Right Ear: External ear normal.  Left Ear: External ear normal.  Eyes: . Pupils are equal, round, and reactive to light. Conjunctivae and EOM are normal Neck: Normal range of motion. Neck supple.  Cardiovascular: Normal rate and regular rhythm.   Pulmonary/Chest: Effort normal and breath sounds without rales or wheezing.  Abd:  Soft, NT, ND, + BS Neurological: Pt is alert. Not confused , motor grossly intact Skin: Skin is warm. No rash, no LE edema Psychiatric: Pt behavior is normal. No agitation.   Lab Results  Component Value Date   WBC 8.2  03/26/2015   HGB 15.1 03/26/2015   HCT 45.5 03/26/2015   PLT 305.0 03/26/2015   GLUCOSE 103* 03/26/2015   CHOL 179 03/26/2015   TRIG 59.0 03/26/2015   HDL 65.50 03/26/2015   LDLDIRECT 139.2 10/29/2012   LDLCALC 102* 03/26/2015   ALT 27 03/26/2015   AST 25 03/26/2015   NA 140 03/26/2015   K 4.1 03/26/2015   CL 104 03/26/2015   CREATININE 1.01 03/26/2015   BUN 12 03/26/2015   CO2 28 03/26/2015   TSH 0.98 03/26/2015   PSA 5.39* 03/26/2015   INR 1.0 06/24/2007   HGBA1C  06/24/2007    5.9 (NOTE)   The ADA recommends the following therapeutic goals for glycemic   control related to Hgb A1C measurement:   Goal of Therapy:   < 7.0% Hgb A1C   Action Suggested:  > 8.0% Hgb A1C   Ref:  Diabetes Care, 22, Suppl. 1, 1999       Assessment & Plan:

## 2015-09-23 NOTE — Addendum Note (Signed)
Addended by: Della Goo C on: 09/23/2015 10:04 AM   Modules accepted: Orders

## 2015-09-23 NOTE — Assessment & Plan Note (Signed)
Etiology unclear, labs ok from nov 2016, subjectively improved, wt stable, appetite ok, cont to follow with expectant management

## 2015-09-23 NOTE — Progress Notes (Signed)
Pre visit review using our clinic review tool, if applicable. No additional management support is needed unless otherwise documented below in the visit note. 

## 2015-09-23 NOTE — Assessment & Plan Note (Signed)
For urology f/u with psa as planned

## 2015-10-05 DIAGNOSIS — C61 Malignant neoplasm of prostate: Secondary | ICD-10-CM | POA: Diagnosis not present

## 2015-10-05 DIAGNOSIS — Z0000 Encounter for general adult medical examination without abnormal findings: Secondary | ICD-10-CM | POA: Diagnosis not present

## 2016-01-20 DIAGNOSIS — C61 Malignant neoplasm of prostate: Secondary | ICD-10-CM | POA: Diagnosis not present

## 2016-01-28 DIAGNOSIS — N401 Enlarged prostate with lower urinary tract symptoms: Secondary | ICD-10-CM | POA: Diagnosis not present

## 2016-01-28 DIAGNOSIS — R351 Nocturia: Secondary | ICD-10-CM | POA: Diagnosis not present

## 2016-01-28 DIAGNOSIS — C61 Malignant neoplasm of prostate: Secondary | ICD-10-CM | POA: Diagnosis not present

## 2016-02-24 ENCOUNTER — Ambulatory Visit (INDEPENDENT_AMBULATORY_CARE_PROVIDER_SITE_OTHER): Payer: Medicare Other

## 2016-02-24 DIAGNOSIS — Z23 Encounter for immunization: Secondary | ICD-10-CM | POA: Diagnosis not present

## 2016-04-04 ENCOUNTER — Other Ambulatory Visit (INDEPENDENT_AMBULATORY_CARE_PROVIDER_SITE_OTHER): Payer: Medicare Other

## 2016-04-04 ENCOUNTER — Encounter: Payer: Self-pay | Admitting: Internal Medicine

## 2016-04-04 ENCOUNTER — Ambulatory Visit (INDEPENDENT_AMBULATORY_CARE_PROVIDER_SITE_OTHER): Payer: Medicare Other | Admitting: Internal Medicine

## 2016-04-04 VITALS — BP 140/80 | HR 72 | Temp 98.0°F | Resp 20 | Wt 195.0 lb

## 2016-04-04 DIAGNOSIS — E785 Hyperlipidemia, unspecified: Secondary | ICD-10-CM

## 2016-04-04 DIAGNOSIS — C61 Malignant neoplasm of prostate: Secondary | ICD-10-CM

## 2016-04-04 DIAGNOSIS — R55 Syncope and collapse: Secondary | ICD-10-CM

## 2016-04-04 DIAGNOSIS — I10 Essential (primary) hypertension: Secondary | ICD-10-CM

## 2016-04-04 LAB — LIPID PANEL
CHOLESTEROL: 191 mg/dL (ref 0–200)
HDL: 67 mg/dL (ref >39.00)
LDL Cholesterol: 100 mg/dL — ABNORMAL HIGH (ref 0–99)
NonHDL: 124.03
Total CHOL/HDL Ratio: 3
Triglycerides: 121 mg/dL (ref 0.0–149.0)
VLDL: 24.2 mg/dL (ref 0.0–40.0)

## 2016-04-04 LAB — TSH: TSH: 1.01 u[IU]/mL (ref 0.35–4.50)

## 2016-04-04 LAB — HEPATIC FUNCTION PANEL
ALBUMIN: 4.5 g/dL (ref 3.5–5.2)
ALK PHOS: 63 U/L (ref 39–117)
ALT: 19 U/L (ref 0–53)
AST: 17 U/L (ref 0–37)
Bilirubin, Direct: 0.1 mg/dL (ref 0.0–0.3)
TOTAL PROTEIN: 7.7 g/dL (ref 6.0–8.3)
Total Bilirubin: 0.7 mg/dL (ref 0.2–1.2)

## 2016-04-04 LAB — URINALYSIS, ROUTINE W REFLEX MICROSCOPIC
BILIRUBIN URINE: NEGATIVE
KETONES UR: NEGATIVE
LEUKOCYTES UA: NEGATIVE
Nitrite: NEGATIVE
PH: 6 (ref 5.0–8.0)
Specific Gravity, Urine: 1.02 (ref 1.000–1.030)
Total Protein, Urine: NEGATIVE
URINE GLUCOSE: NEGATIVE
UROBILINOGEN UA: 0.2 (ref 0.0–1.0)

## 2016-04-04 LAB — BASIC METABOLIC PANEL
BUN: 13 mg/dL (ref 6–23)
CALCIUM: 9.5 mg/dL (ref 8.4–10.5)
CO2: 31 meq/L (ref 19–32)
CREATININE: 1.03 mg/dL (ref 0.40–1.50)
Chloride: 104 mEq/L (ref 96–112)
GFR: 92.39 mL/min (ref >60.00)
GLUCOSE: 83 mg/dL (ref 70–99)
Potassium: 3.9 mEq/L (ref 3.5–5.1)
Sodium: 141 mEq/L (ref 135–145)

## 2016-04-04 LAB — PSA: PSA: 0.81 ng/mL (ref 0.10–4.00)

## 2016-04-04 MED ORDER — BENAZEPRIL HCL 10 MG PO TABS: 10.0000 mg | ORAL_TABLET | Freq: Every day | ORAL | 3 refills | Status: DC

## 2016-04-04 NOTE — Progress Notes (Signed)
Pre visit review using our clinic review tool, if applicable. No additional management support is needed unless otherwise documented below in the visit note. 

## 2016-04-04 NOTE — Patient Instructions (Addendum)
Your EKG was OK today  Your blood pressures were Ok today with standing  OK to reduce the benazepril to 10 mg per day  You will be contacted regarding the referral for: Echocardiogram, and Carotid Artery ultrasound  Please continue all other medications as before, and refills have been done if requested.  Please have the pharmacy call with any other refills you may need.  Please continue your efforts at being more active, low cholesterol diet, and weight control.  You are otherwise up to date with prevention measures today.  Please keep your appointments with your specialists as you may have planned  Please go to the LAB in the Basement (turn left off the elevator) for the tests to be done today  You will be contacted by phone if any changes need to be made immediately.  Otherwise, you will receive a letter about your results with an explanation, but please check with MyChart first.  Please remember to sign up for MyChart if you have not done so, as this will be important to you in the future with finding out test results, communicating by private email, and scheduling acute appointments online when needed.  If you have Medicare related insurance (such as traditoinal Medicare, [employer] or [employer], or similar), Please make an appointment at the Scheduling desk with Maudie Mercury, the [employer], for your Wellness Visit in this office, which is a benefit with your insurance.

## 2016-04-04 NOTE — Progress Notes (Signed)
Subjective:    Patient ID: [name] [name], male    DOB: May 09, 1950, 65 y.o.   MRN: [id number]  HPI  Cold for 2 wks, then last wk felt ok, then last sun was at church but not overly emotional, was joining in prayer and had episode of lightheaded with standing, had to leave and sit down, drink water, resolved in 1 hr..   None since then and Pt otherwise denies chest pain, increased sob or doe, wheezing, orthopnea, PND, increased LE swelling, palpitations  Pt denies new neurological symptoms such as new headache, or facial or extremity weakness or numbness  Just felt like I got "too hot". Denies worsening reflux, abd pain, dysphagia, n/v, bowel change or blood. BP at home 110/65, later 115/65 but had no symptoms. Did not take med today, wondering if needs less med.   Past Medical History:  Diagnosis Date  . BENIGN PROSTATIC HYPERTROPHY 05/06/2010  . COMMON MIGRAINE 07/31/2007  . GERD 07/31/2007  . HYPERLIPIDEMIA 01/04/2007  . HYPERTENSION 01/04/2007  . OVERACTIVE BLADDER 03/06/2007  . Prostate cancer ([place]) 02/04/15   No past surgical history on file.  reports that he has never smoked. He does not have any smokeless tobacco history on file. He reports that he drinks alcohol. His drug history is not on file. family history includes Lung cancer (age of onset: 63) in his father. Allergies  Allergen Reactions  . Celecoxib Other (See Comments)    Stroke like symptoms  . Crestor [Rosuvastatin]     weakness  . Detrol [Tolterodine] Other (See Comments)    headache  . Ditropan [Oxybutynin] Other (See Comments)    Dry mouth   Current Outpatient Prescriptions on File Prior to Visit  Medication Sig Dispense Refill  . amLODipine (NORVASC) 10 MG tablet TAKE 1 TABLET EVERY DAY 90 tablet 3  . aspirin 81 MG tablet Take 81 mg by mouth daily.     Marland Kitchen atorvastatin (LIPITOR) 10 MG tablet TAKE 1 TABLET EVERY DAY 90 tablet 3  . diazepam (VALIUM) 10 MG tablet Reported on 08/05/2015    . fesoterodine (TOVIAZ) 4 MG  TB24 tablet Take 1 tablet (4 mg total) by mouth daily. 90 tablet 3  . polyethylene glycol (MIRALAX / GLYCOLAX) packet Take 17 g by mouth as needed.     . Tamsulosin HCl (FLOMAX) 0.4 MG CAPS Take 1 capsule (0.4 mg total) by mouth daily. 30 capsule 11   No current facility-administered medications on file prior to visit.    Review of Systems  Constitutional: Negative for unusual diaphoresis or night sweats HENT: Negative for ear swelling or discharge Eyes: Negative for worsening visual haziness  Respiratory: Negative for choking and stridor.   Gastrointestinal: Negative for distension or worsening eructation Genitourinary: Negative for retention or change in urine volume.  Musculoskeletal: Negative for other MSK pain or swelling Skin: Negative for color change and worsening wound Neurological: Negative for tremors and numbness other than noted  Psychiatric/Behavioral: Negative for decreased concentration or agitation other than above   All other system neg per pt    Objective:   Physical Exam BP 140/80   Pulse 72   Temp 98 F (36.7 C) (Oral)   Resp 20   Wt 195 lb (88.5 kg)   SpO2 97%   BMI 30.54 kg/m  VS noted, not ill appearing Constitutional: Pt appears in no apparent distress HENT: Head: NCAT.  Right Ear: External ear normal.  Left Ear: External ear normal.  Eyes: . Pupils are  equal, round, and reactive to light. Conjunctivae and EOM are normal Neck: Normal range of motion. Neck supple.  Cardiovascular: Normal rate and regular rhythm.   Pulmonary/Chest: Effort normal and breath sounds without rales or wheezing.  Abd:  Soft, NT, ND, + BS Neurological: Pt is alert. Not confused , motor grossly intact Skin: Skin is warm. No rash, no LE edema Psychiatric: Pt behavior is normal. No agitation.   ECG today I have personally interpreted Sinus  Rhythm  -First degree A-V block  -With rate variation   -  Nonspecific T-abnormality.     Assessment & Plan:

## 2016-04-05 LAB — CBC WITH DIFFERENTIAL/PLATELET
BASOS ABS: 0.1 10*3/uL (ref 0.0–0.1)
Basophils Relative: 0.9 % (ref 0.0–3.0)
Eosinophils Absolute: 0.1 10*3/uL (ref 0.0–0.7)
Eosinophils Relative: 1 % (ref 0.0–5.0)
HCT: 41.8 % (ref 39.0–52.0)
Hemoglobin: 14.2 g/dL (ref 13.0–17.0)
LYMPHS ABS: 1.6 10*3/uL (ref 0.7–4.0)
LYMPHS PCT: 16.1 % (ref 12.0–46.0)
MCHC: 34 g/dL (ref 30.0–36.0)
MCV: 92.1 fl (ref 78.0–100.0)
MONOS PCT: 13 % — AB (ref 3.0–12.0)
Monocytes Absolute: 1.3 10*3/uL — ABNORMAL HIGH (ref 0.1–1.0)
NEUTROS PCT: 69 % (ref 43.0–77.0)
Neutro Abs: 6.6 10*3/uL (ref 1.4–7.7)
Platelets: 268 10*3/uL (ref 150.0–400.0)
RBC: 4.54 Mil/uL (ref 4.22–5.81)
RDW: 14.4 % (ref 11.5–15.5)
WBC: 9.6 10*3/uL (ref 4.0–10.5)

## 2016-04-10 NOTE — Assessment & Plan Note (Addendum)
Likely c/w near vasovagal episode x 1 hr before resolved, doubt med related or orthostatic hypotension, ecg reviewed, exam o/w benign - no neuro changes,, for labs as ordered and echo/carotids, hold on CNS imaging for now,  to f/u any worsening symptoms or concerns

## 2016-04-10 NOTE — Assessment & Plan Note (Signed)
Also for psa with labs,  to f/u any worsening symptoms or concerns 

## 2016-04-10 NOTE — Assessment & Plan Note (Signed)
Lab Results  Component Value Date   LDLCALC 100 (H) 04/04/2016   stable overall by history and exam, recent data reviewed with pt, and pt to continue medical treatment as before,  to f/u any worsening symptoms or concerns

## 2016-04-10 NOTE — Assessment & Plan Note (Signed)
?   Overcontrolled, to reduce the lisinopril to 10 qd from 20 mg

## 2016-05-17 ENCOUNTER — Other Ambulatory Visit: Payer: Self-pay

## 2016-05-17 ENCOUNTER — Encounter: Payer: Self-pay | Admitting: Internal Medicine

## 2016-05-17 ENCOUNTER — Ambulatory Visit (HOSPITAL_COMMUNITY): Payer: Medicare Other | Attending: Cardiovascular Disease

## 2016-05-17 DIAGNOSIS — I5189 Other ill-defined heart diseases: Secondary | ICD-10-CM | POA: Insufficient documentation

## 2016-05-17 DIAGNOSIS — R55 Syncope and collapse: Secondary | ICD-10-CM | POA: Diagnosis not present

## 2016-05-17 DIAGNOSIS — I517 Cardiomegaly: Secondary | ICD-10-CM | POA: Insufficient documentation

## 2016-05-17 DIAGNOSIS — I700 Atherosclerosis of aorta: Secondary | ICD-10-CM | POA: Diagnosis not present

## 2016-05-18 ENCOUNTER — Encounter: Payer: Self-pay | Admitting: Internal Medicine

## 2016-05-19 MED ORDER — TADALAFIL 20 MG PO TABS: 20.0000 mg | ORAL_TABLET | Freq: Every day | ORAL | 11 refills | Status: DC | PRN

## 2016-05-23 ENCOUNTER — Ambulatory Visit (HOSPITAL_COMMUNITY)
Admission: RE | Admit: 2016-05-23 | Discharge: 2016-05-23 | Disposition: A | Discharge status: Home / Self Care | Payer: Medicare Other | Source: Ambulatory Visit | Attending: Cardiology | Admitting: Cardiology

## 2016-05-23 DIAGNOSIS — I6523 Occlusion and stenosis of bilateral carotid arteries: Secondary | ICD-10-CM | POA: Insufficient documentation

## 2016-05-23 DIAGNOSIS — R55 Syncope and collapse: Secondary | ICD-10-CM | POA: Diagnosis not present

## 2016-07-19 DIAGNOSIS — N5201 Erectile dysfunction due to arterial insufficiency: Secondary | ICD-10-CM | POA: Diagnosis not present

## 2016-07-19 DIAGNOSIS — R351 Nocturia: Secondary | ICD-10-CM | POA: Diagnosis not present

## 2016-07-19 DIAGNOSIS — C61 Malignant neoplasm of prostate: Secondary | ICD-10-CM | POA: Diagnosis not present

## 2016-07-19 DIAGNOSIS — N401 Enlarged prostate with lower urinary tract symptoms: Secondary | ICD-10-CM | POA: Diagnosis not present

## 2016-07-25 DIAGNOSIS — H5203 Hypermetropia, bilateral: Secondary | ICD-10-CM | POA: Diagnosis not present

## 2016-07-25 DIAGNOSIS — H00021 Hordeolum internum right upper eyelid: Secondary | ICD-10-CM | POA: Diagnosis not present

## 2016-07-25 DIAGNOSIS — H04123 Dry eye syndrome of bilateral lacrimal glands: Secondary | ICD-10-CM | POA: Diagnosis not present

## 2016-07-25 DIAGNOSIS — H11153 Pinguecula, bilateral: Secondary | ICD-10-CM | POA: Diagnosis not present

## 2016-07-25 DIAGNOSIS — H11423 Conjunctival edema, bilateral: Secondary | ICD-10-CM | POA: Diagnosis not present

## 2016-07-25 DIAGNOSIS — H52223 Regular astigmatism, bilateral: Secondary | ICD-10-CM | POA: Diagnosis not present

## 2016-07-25 DIAGNOSIS — H401131 Primary open-angle glaucoma, bilateral, mild stage: Secondary | ICD-10-CM | POA: Diagnosis not present

## 2016-07-25 DIAGNOSIS — H18413 Arcus senilis, bilateral: Secondary | ICD-10-CM | POA: Diagnosis not present

## 2016-07-25 DIAGNOSIS — H25013 Cortical age-related cataract, bilateral: Secondary | ICD-10-CM | POA: Diagnosis not present

## 2016-07-25 DIAGNOSIS — H2513 Age-related nuclear cataract, bilateral: Secondary | ICD-10-CM | POA: Diagnosis not present

## 2016-07-25 DIAGNOSIS — H16143 Punctate keratitis, bilateral: Secondary | ICD-10-CM | POA: Diagnosis not present

## 2016-07-25 DIAGNOSIS — H35373 Puckering of macula, bilateral: Secondary | ICD-10-CM | POA: Diagnosis not present

## 2016-07-31 ENCOUNTER — Telehealth: Payer: Self-pay | Admitting: Internal Medicine

## 2016-07-31 NOTE — Telephone Encounter (Signed)
Called patient to schedule appt. Lvm for patient to call office to schedule appt.

## 2016-08-11 ENCOUNTER — Ambulatory Visit (INDEPENDENT_AMBULATORY_CARE_PROVIDER_SITE_OTHER): Payer: Medicare Other | Admitting: *Deleted

## 2016-08-11 VITALS — BP 136/82 | HR 69 | Resp 20 | Ht 67.0 in | Wt 200.0 lb

## 2016-08-11 DIAGNOSIS — Z0000 Encounter for general adult medical examination without abnormal findings: Secondary | ICD-10-CM | POA: Diagnosis not present

## 2016-08-11 NOTE — Progress Notes (Addendum)
Subjective:   [name] [name] is a 66 y.o. male who presents for an Initial Medicare Annual Wellness Visit.  Review of Systems  No ROS.  Medicare Wellness Visit.  Cardiac Risk Factors include: advanced age (>5men, >60 women);dyslipidemia;hypertension;male gender  Sleep patterns: no sleep issues, feels rested on waking, gets up 4 times nightly to void and sleeps 6-8 hours nightly.   Home Safety/Smoke Alarms: Feels safe in home. Smoke alarms in place.   Living environment; residence and Firearm Safety: 1-story house/ trailer, no firearms. Lives with wife Seat Belt Safety/Bike Helmet: Wears seat belt.   Counseling:   Eye Exam- appointment every 6 months Dental- appointment every 6 months  Male:   CCS- Last 10/22/07, benign polyps, recall 10 years     PSA-  Lab Results  Component Value Date   PSA 0.81 04/04/2016   PSA 5.39 (H) 03/26/2015   PSA 5.57 (H) 10/13/2014     Objective:    Today's Vitals   08/11/16 1010  BP: 136/82  Pulse: 69  Resp: 20  SpO2: 99%  Weight: 200 lb (90.7 kg)  Height: 5\' 7"  (1.702 m)   Body mass index is 31.32 kg/m.  Current Medications (verified) Outpatient Encounter Prescriptions as of 08/11/2016  Medication Sig  . amLODipine (NORVASC) 10 MG tablet TAKE 1 TABLET EVERY DAY  . aspirin 81 MG tablet Take 81 mg by mouth daily.   . benazepril (LOTENSIN) 10 MG tablet Take 1 tablet (10 mg total) by mouth daily.  . polyethylene glycol (MIRALAX / GLYCOLAX) packet Take 17 g by mouth as needed.   . Tamsulosin HCl (FLOMAX) 0.4 MG CAPS Take 1 capsule (0.4 mg total) by mouth daily.  Marland Kitchen atorvastatin (LIPITOR) 10 MG tablet TAKE 1 TABLET EVERY DAY  . [DISCONTINUED] diazepam (VALIUM) 10 MG tablet Reported on 08/05/2015  . [DISCONTINUED] fesoterodine (TOVIAZ) 4 MG TB24 tablet Take 1 tablet (4 mg total) by mouth daily. (Patient not taking: Reported on 08/11/2016)  . [DISCONTINUED] tadalafil (CIALIS) 20 MG tablet Take 1 tablet (20 mg total) by mouth daily as needed for  erectile dysfunction.   No facility-administered encounter medications on file as of 08/11/2016.     Allergies (verified) Celecoxib; Crestor [rosuvastatin]; Detrol [tolterodine]; and Ditropan [oxybutynin]   History: Past Medical History:  Diagnosis Date  . BENIGN PROSTATIC HYPERTROPHY 05/06/2010  . COMMON MIGRAINE 07/31/2007  . GERD 07/31/2007  . HYPERLIPIDEMIA 01/04/2007  . HYPERTENSION 01/04/2007  . OVERACTIVE BLADDER 03/06/2007  . Prostate cancer ([place]) 02/04/15   History reviewed. No pertinent surgical history. Family History  Problem Relation Age of Onset  . Lung cancer Father 71   Social History   Occupational History  . Not on file.   Social History Main Topics  . Smoking status: Never Smoker  . Smokeless tobacco: Never Used  . Alcohol use No  . Drug use: No  . Sexual activity: Yes   Tobacco Counseling Counseling given: Not Answered   Activities of Daily Living In your present state of health, do you have any difficulty performing the following activities: 08/11/2016  Hearing? N  Vision? N  Difficulty concentrating or making decisions? N  Walking or climbing stairs? N  Dressing or bathing? N  Doing errands, shopping? N  Preparing Food and eating ? N  Using the Toilet? N  In the past six months, have you accidently leaked urine? N  Do you have problems with loss of bowel control? N  Managing your Medications? N  Managing your Finances?  N  Housekeeping or managing your Housekeeping? N  Some recent data might be hidden    Immunizations and Health Maintenance Immunization History  Administered Date(s) Administered  . Influenza Split 03/15/2012  . Influenza Whole 03/08/2006, 03/09/2008, 02/05/2009  . Influenza,inj,Quad PF,36+ Mos 02/03/2013, 02/27/2014, 02/23/2015, 02/24/2016  . Pneumococcal Conjugate-13 07/15/2013, 04/08/2014  . Pneumococcal Polysaccharide-23 09/23/2015  . Td 04/20/2008  . Zoster 05/06/2010   There are no preventive care reminders to  display for this patient.  Patient Care Team: Biagio Borg, MD as PCP - General  Indicate any recent Medical Services you may have received from other than [facility] providers in the past year (date may be approximate).    Assessment:   This is a routine wellness examination for [place]. Physical assessment deferred to PCP.   Hearing/Vision screen Hearing Screening Comments: Able to hear conversational tones w/o difficulty. No issues reported.  Passed whisper test Vision Screening Comments: Wears glasses  Dietary issues and exercise activities discussed: Current Exercise Habits: Home exercise routine, Type of exercise: walking, Time (Minutes): 30, Frequency (Times/Week): 2, Weekly Exercise (Minutes/Week): 60, Intensity: Mild  Diet (meal preparation, eat out, water intake, caffeinated beverages, dairy products, fruits and vegetables): in general, a "healthy" diet  , low fat/ cholesterol, low salt  Patient reports eating a variety of fruits and vegetables daily, limits sugar, fried foods, drinks 2-3 bottles of water daily.  Encouraged patient to continue healthy diet and to increase daily water intake.  Goals    . reduce stomach fat          Limit sugar and carbohydrates and do sit-ups      Depression Screen PHQ 2/9 Scores 08/11/2016 04/04/2016 08/05/2015 07/28/2015  PHQ - 2 Score 0 0 0 0    Fall Risk Fall Risk  08/11/2016 04/04/2016 08/05/2015 05/27/2015 03/26/2015  Falls in the past year? No No No No No    Cognitive Function:       Ad8 score reviewed for issues:  Issues making decisions: no  Less interest in hobbies / activities: no  Repeats questions, stories (family complaining): no  Trouble using ordinary gadgets (microwave, computer, phone): no  Forgets the month or year: no  Mismanaging finances: no  Remembering appts: no  Daily problems with thinking and/or memory: no Ad8 score is= 0  Screening Tests Health Maintenance  Topic Date Due  . INFLUENZA VACCINE   12/06/2016  . COLONOSCOPY  10/21/2017  . TETANUS/TDAP  04/20/2018  . Hepatitis C Screening  Completed  . PNA vac Low Risk Adult  Completed      Plan:    Continue to eat heart healthy diet (full of fruits, vegetables, whole grains, lean protein, water--limit salt, fat, and sugar intake) and increase physical activity as tolerated.  Continue doing brain stimulating activities (puzzles, reading, adult coloring books, staying active) to keep memory sharp.   During the course of the visit Valon was educated and counseled about the following appropriate screening and preventive services:   Vaccines to include Pneumoccal, Influenza, Hepatitis B, Td, Zostavax, HCV  Colorectal cancer screening  Cardiovascular disease screening  Diabetes screening  Glaucoma screening  Nutrition counseling  Prostate cancer screening  Patient Instructions (the written plan) were given to the patient.   Michiel Cowboy, RN   08/11/2016    Medical screening examination/treatment/procedure(s) were performed by non-physician practitioner and as supervising physician I was immediately available for consultation/collaboration. I agree with above. [name], MD

## 2016-08-11 NOTE — Patient Instructions (Addendum)
Continue to eat heart healthy diet (full of fruits, vegetables, whole grains, lean protein, water--limit salt, fat, and sugar intake) and increase physical activity as tolerated.  Continue doing brain stimulating activities (puzzles, reading, adult coloring books, staying active) to keep memory sharp.   [name] [name] , Thank you for taking time to come for your Medicare Wellness Visit. I appreciate your ongoing commitment to your health goals. Please review the following plan we discussed and let me know if I can assist you in the future.   These are the goals we discussed: Goals    . reduce stomach fat          Limit sugar and carbohydrates and so sit-ups       This is a list of the screening recommended for you and due dates:  Health Maintenance  Topic Date Due  . Flu Shot  12/06/2016  . Colon Cancer Screening  10/21/2017  . Tetanus Vaccine  04/20/2018  .  Hepatitis C: One time screening is recommended by Center for Disease Control  (CDC) for  adults born from [birth year] through 1967.   Completed  . Pneumonia vaccines  Completed

## 2016-08-11 NOTE — Progress Notes (Signed)
Pre visit review using our clinic review tool, if applicable. No additional management support is needed unless otherwise documented below in the visit note. 

## 2016-08-17 ENCOUNTER — Ambulatory Visit (INDEPENDENT_AMBULATORY_CARE_PROVIDER_SITE_OTHER): Payer: Medicare Other | Admitting: Internal Medicine

## 2016-08-17 ENCOUNTER — Encounter: Payer: Self-pay | Admitting: Internal Medicine

## 2016-08-17 VITALS — BP 138/84 | HR 72 | Wt 193.0 lb

## 2016-08-17 DIAGNOSIS — N529 Male erectile dysfunction, unspecified: Secondary | ICD-10-CM

## 2016-08-17 DIAGNOSIS — J309 Allergic rhinitis, unspecified: Secondary | ICD-10-CM | POA: Diagnosis not present

## 2016-08-17 DIAGNOSIS — H6981 Other specified disorders of Eustachian tube, right ear: Secondary | ICD-10-CM | POA: Diagnosis not present

## 2016-08-17 MED ORDER — SILDENAFIL CITRATE 100 MG PO TABS: 50.0000 mg | ORAL_TABLET | Freq: Every day | ORAL | 11 refills | Status: DC | PRN

## 2016-08-17 MED ORDER — METHYLPREDNISOLONE ACETATE 80 MG/ML IJ SUSP
80.0000 mg | Freq: Once | INTRAMUSCULAR | Status: AC
  Administered 2016-08-17: 80 mg via INTRAMUSCULAR

## 2016-08-17 MED ORDER — CETIRIZINE HCL 10 MG PO TABS: 10.0000 mg | ORAL_TABLET | Freq: Every day | ORAL | 11 refills | Status: DC

## 2016-08-17 MED ORDER — TRIAMCINOLONE ACETONIDE 55 MCG/ACT NA AERO: 2.0000 | INHALATION_SPRAY | Freq: Every day | NASAL | 12 refills | Status: DC

## 2016-08-17 NOTE — Patient Instructions (Signed)
You had the steroid shot today  Please take all new medication as prescribed- the zyrtec and nasacort for allergies  You can also take Mucinex (or it's generic off brand) for congestion, and tylenol as needed for pain.  Please continue all other medications as before, and refills have been done if requested.  Please have the pharmacy call with any other refills you may need.  Please keep your appointments with your specialists as you may have planned

## 2016-08-17 NOTE — Progress Notes (Signed)
Subjective:    Patient ID: [name] [name], male    DOB: 07-09-1950, 66 y.o.   MRN: [id number]  HPI  Here to f/u, Does have several wks ongoing nasal allergy symptoms with clearish congestion, itch and sneezing, without fever, pain, ST, cough, swelling or wheezing.  Pt denies new neurological symptoms such as new headache, or facial or extremity weakness or numbness   Pt denies polydipsia, polyuria.  Does also have right hearing loss without HA, d/c or vertigo, ? Wax impaction.  Has also ongoing ED symptoms, asks for viagra prn since now generic.  Denies urinary symptoms such as dysuria, frequency, urgency, flank pain, hematuria or n/v, fever, chills.  Past Medical History:  Diagnosis Date  . BENIGN PROSTATIC HYPERTROPHY 05/06/2010  . COMMON MIGRAINE 07/31/2007  . GERD 07/31/2007  . HYPERLIPIDEMIA 01/04/2007  . HYPERTENSION 01/04/2007  . OVERACTIVE BLADDER 03/06/2007  . Prostate cancer ([place]) 02/04/15   No past surgical history on file.  reports that he has never smoked. He has never used smokeless tobacco. He reports that he does not drink alcohol or use drugs. family history includes Lung cancer (age of onset: 62) in his father. Allergies  Allergen Reactions  . Celecoxib Other (See Comments)    Stroke like symptoms  . Crestor [Rosuvastatin]     weakness  . Detrol [Tolterodine] Other (See Comments)    headache  . Ditropan [Oxybutynin] Other (See Comments)    Dry mouth   Current Outpatient Prescriptions on File Prior to Visit  Medication Sig Dispense Refill  . amLODipine (NORVASC) 10 MG tablet TAKE 1 TABLET EVERY DAY 90 tablet 3  . aspirin 81 MG tablet Take 81 mg by mouth daily.     Marland Kitchen atorvastatin (LIPITOR) 10 MG tablet TAKE 1 TABLET EVERY DAY 90 tablet 3  . benazepril (LOTENSIN) 10 MG tablet Take 1 tablet (10 mg total) by mouth daily. 90 tablet 3  . polyethylene glycol (MIRALAX / GLYCOLAX) packet Take 17 g by mouth as needed.     . Tamsulosin HCl (FLOMAX) 0.4 MG CAPS Take 1 capsule  (0.4 mg total) by mouth daily. 30 capsule 11   No current facility-administered medications on file prior to visit.    Review of Systems  Constitutional: Negative for other unusual diaphoresis or sweats HENT: Negative for ear discharge or swelling Eyes: Negative for other worsening visual disturbances Respiratory: Negative for stridor or other swelling  Gastrointestinal: Negative for worsening distension or other blood Genitourinary: Negative for retention or other urinary change Musculoskeletal: Negative for other MSK pain or swelling Skin: Negative for color change or other new lesions Neurological: Negative for worsening tremors and other numbness  Psychiatric/Behavioral: Negative for worsening agitation or other fatigue All other system neg per pt    Objective:   Physical Exam BP 138/84   Pulse 72   Wt 193 lb (87.5 kg)   SpO2 98%   BMI 30.23 kg/m  VS noted,  Constitutional: Pt appears in NAD HENT: Head: NCAT.  Right Ear: External ear normal.  Left Ear: External ear normal.  Bilat tm's with mild erythema.  Max sinus areas non tender.  Pharynx with mild erythema, no exudateEyes: . Pupils are equal, round, and reactive to light. Conjunctivae and EOM are normal Nose: without d/c or deformity Neck: Neck supple. Gross normal ROM Cardiovascular: Normal rate and regular rhythm.   Pulmonary/Chest: Effort normal and breath sounds without rales or wheezing.  Abd:  Soft, NT, ND, + BS, no organomegaly Neurological: Pt  is alert. At baseline orientation, motor grossly intact Skin: Skin is warm. No rashes, other new lesions, no LE edema Psychiatric: Pt behavior is normal without agitation  No other exam findings     Assessment & Plan:

## 2016-08-17 NOTE — Progress Notes (Signed)
Pre visit review using our clinic review tool, if applicable. No additional management support is needed unless otherwise documented below in the visit note. 

## 2016-08-19 NOTE — Assessment & Plan Note (Signed)
Mild to mod, c/w seasonal flare, for depomedrol IM 80, also zyrtec and nasacort asd, to f/u any worsening symptoms or concerns

## 2016-08-19 NOTE — Assessment & Plan Note (Signed)
Mild to mod, for viagra prn,  to f/u any worsening symptoms or concerns 

## 2016-08-19 NOTE — Assessment & Plan Note (Signed)
Mild to mod, no wax impaction today, for mucinex otc prn,  to f/u any worsening symptoms or concerns

## 2016-08-25 IMAGING — DX DG CHEST 2V
2 series · 2 of 2 positions shown · non-contrast
Comparison: 08/29/2014

CLINICAL DATA: Fatigue and dyspnea on exertion. History of
hypertension.

EXAM:
CHEST  2 VIEW

[chest pa]
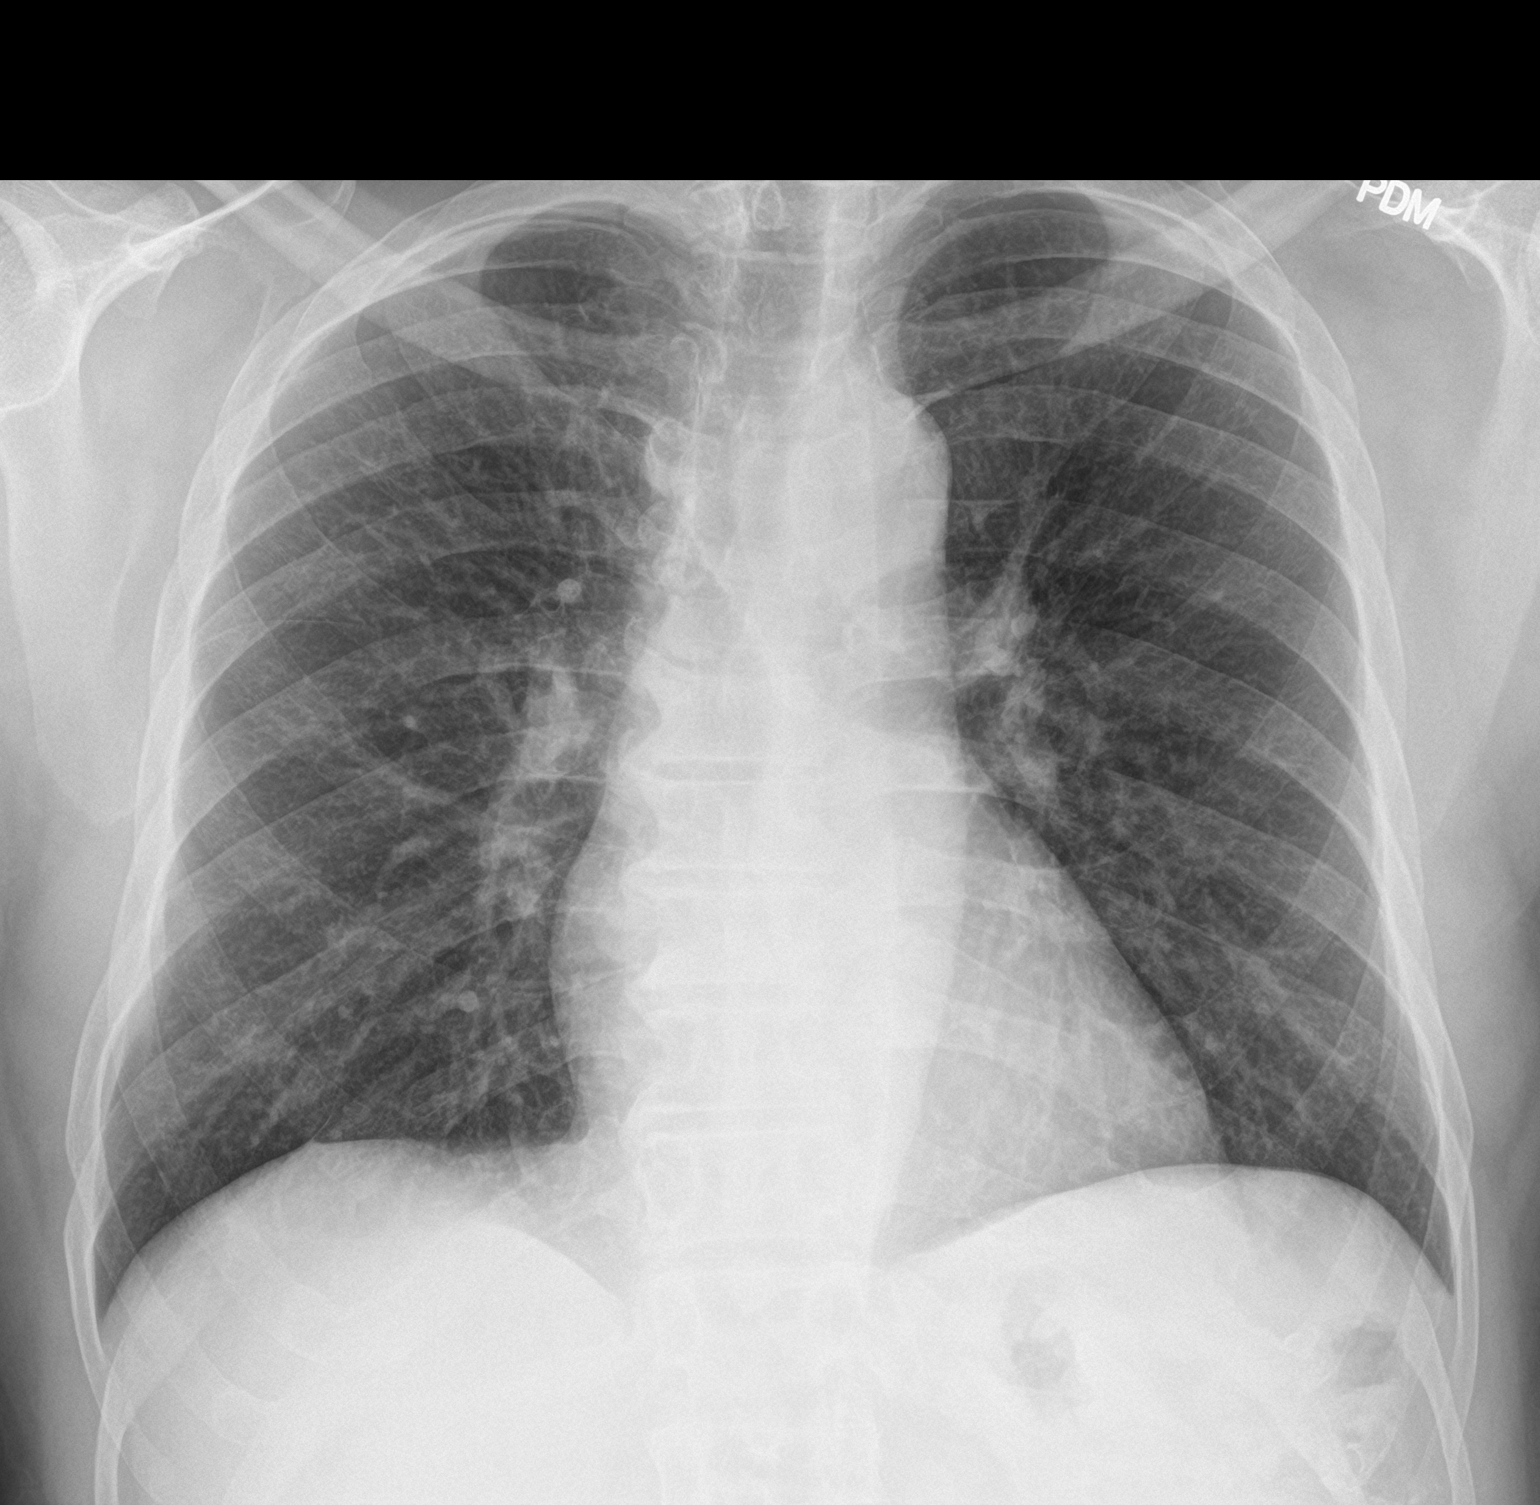

[chest lat]
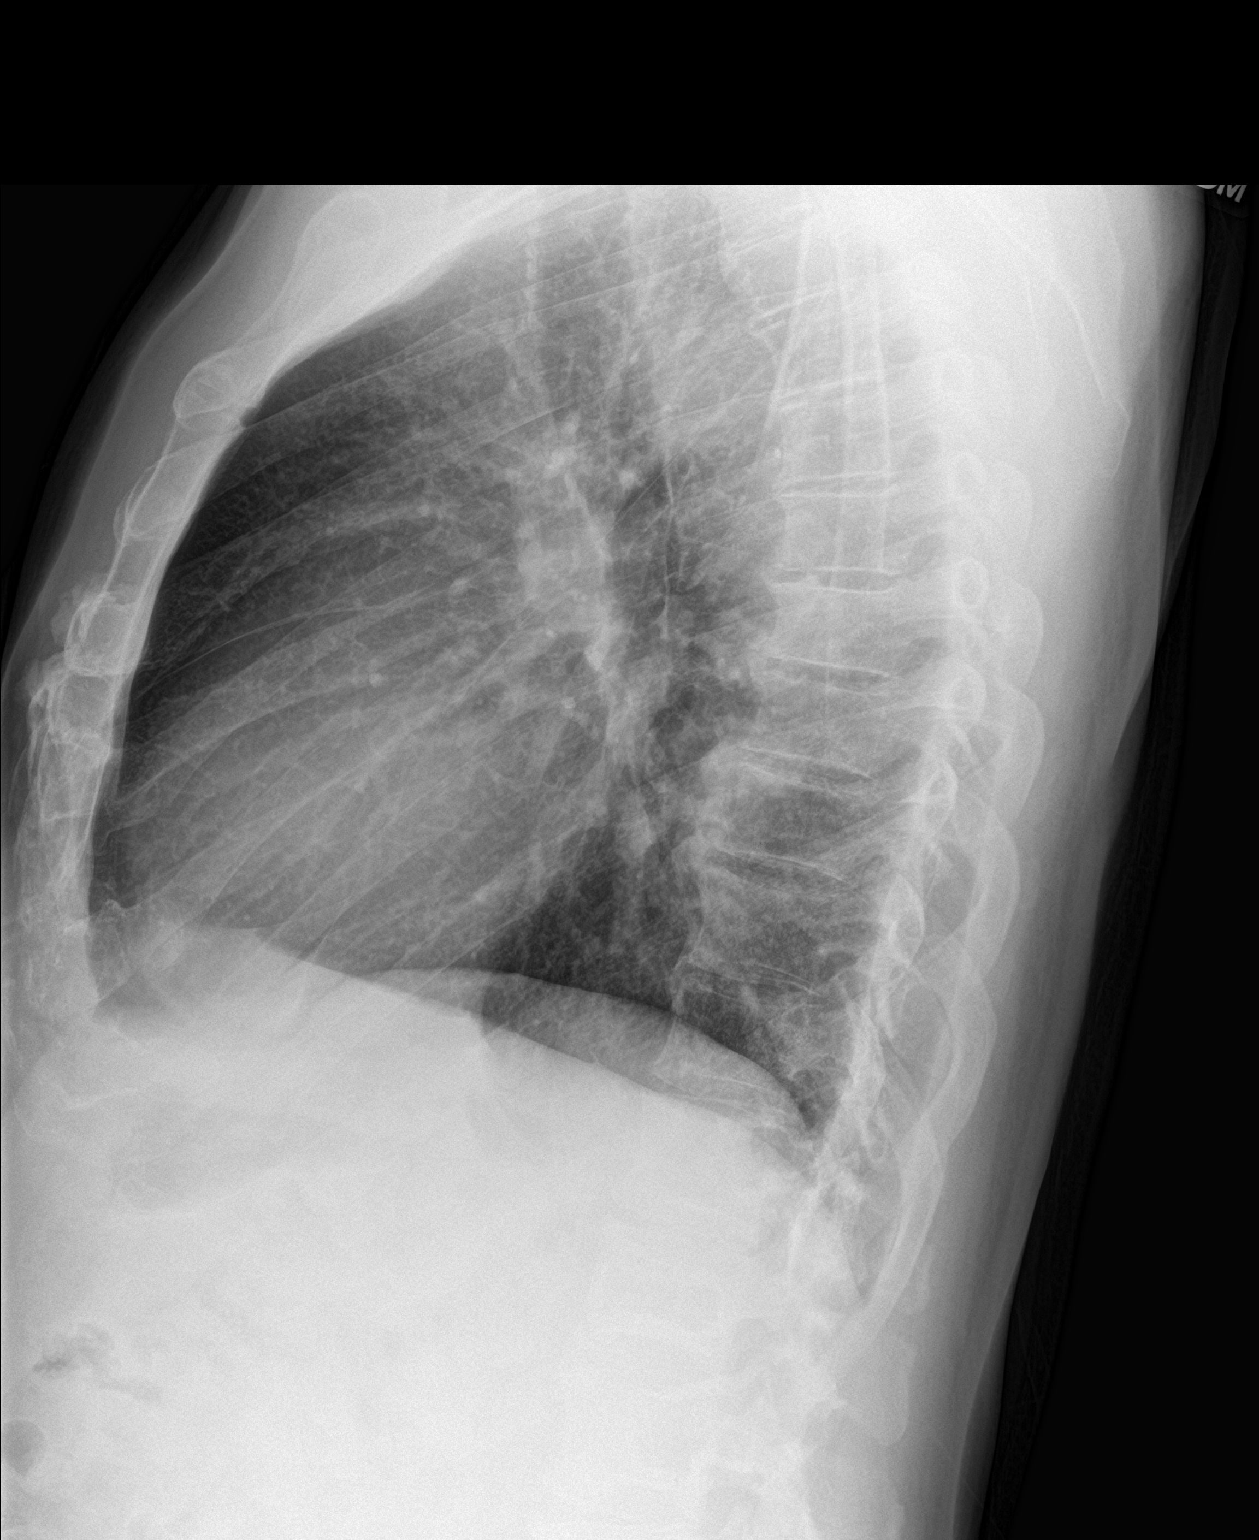

[2 of 2 positions shown; findings below may reference images not displayed]

FINDINGS: Cardiac silhouette normal in size and configuration. Normal
mediastinal and hilar contours.

There is prominence of the bronchovascular markings bilaterally, but
no evidence of pneumonia or pulmonary edema. No pleural effusion or
pneumothorax. The bony thorax is intact.
IMPRESSION: No active cardiopulmonary disease.

## 2016-09-28 ENCOUNTER — Encounter: Payer: Self-pay | Admitting: Internal Medicine

## 2016-09-28 MED ORDER — SILDENAFIL CITRATE 100 MG PO TABS: 50.0000 mg | ORAL_TABLET | Freq: Every day | ORAL | 1 refills | Status: DC | PRN

## 2016-09-28 NOTE — Telephone Encounter (Signed)
Done hardcopy to Shirron  

## 2016-09-30 MED ORDER — SILDENAFIL CITRATE 20 MG PO TABS
ORAL_TABLET | ORAL | 11 refills | Status: DC
Start: 2016-09-30 — End: 2018-01-07

## 2016-10-12 ENCOUNTER — Encounter: Payer: Self-pay | Admitting: Internal Medicine

## 2016-10-12 DIAGNOSIS — H905 Unspecified sensorineural hearing loss: Secondary | ICD-10-CM

## 2016-10-19 DIAGNOSIS — H11153 Pinguecula, bilateral: Secondary | ICD-10-CM | POA: Diagnosis not present

## 2016-10-19 DIAGNOSIS — H2513 Age-related nuclear cataract, bilateral: Secondary | ICD-10-CM | POA: Diagnosis not present

## 2016-10-19 DIAGNOSIS — H04123 Dry eye syndrome of bilateral lacrimal glands: Secondary | ICD-10-CM | POA: Diagnosis not present

## 2016-10-19 DIAGNOSIS — H11423 Conjunctival edema, bilateral: Secondary | ICD-10-CM | POA: Diagnosis not present

## 2016-10-19 DIAGNOSIS — H401231 Low-tension glaucoma, bilateral, mild stage: Secondary | ICD-10-CM | POA: Diagnosis not present

## 2016-10-19 DIAGNOSIS — H35373 Puckering of macula, bilateral: Secondary | ICD-10-CM | POA: Diagnosis not present

## 2016-10-19 DIAGNOSIS — H47233 Glaucomatous optic atrophy, bilateral: Secondary | ICD-10-CM | POA: Diagnosis not present

## 2016-10-19 DIAGNOSIS — H18413 Arcus senilis, bilateral: Secondary | ICD-10-CM | POA: Diagnosis not present

## 2016-10-23 DIAGNOSIS — H11153 Pinguecula, bilateral: Secondary | ICD-10-CM | POA: Diagnosis not present

## 2016-10-23 DIAGNOSIS — H401231 Low-tension glaucoma, bilateral, mild stage: Secondary | ICD-10-CM | POA: Diagnosis not present

## 2016-10-23 DIAGNOSIS — H16223 Keratoconjunctivitis sicca, not specified as Sjogren's, bilateral: Secondary | ICD-10-CM | POA: Diagnosis not present

## 2016-10-23 DIAGNOSIS — H16143 Punctate keratitis, bilateral: Secondary | ICD-10-CM | POA: Diagnosis not present

## 2016-10-23 DIAGNOSIS — H25013 Cortical age-related cataract, bilateral: Secondary | ICD-10-CM | POA: Diagnosis not present

## 2016-10-23 DIAGNOSIS — H04123 Dry eye syndrome of bilateral lacrimal glands: Secondary | ICD-10-CM | POA: Diagnosis not present

## 2016-10-23 DIAGNOSIS — H2513 Age-related nuclear cataract, bilateral: Secondary | ICD-10-CM | POA: Diagnosis not present

## 2016-10-23 DIAGNOSIS — H35373 Puckering of macula, bilateral: Secondary | ICD-10-CM | POA: Diagnosis not present

## 2016-10-23 DIAGNOSIS — H11423 Conjunctival edema, bilateral: Secondary | ICD-10-CM | POA: Diagnosis not present

## 2016-10-23 DIAGNOSIS — H18413 Arcus senilis, bilateral: Secondary | ICD-10-CM | POA: Diagnosis not present

## 2016-10-23 DIAGNOSIS — H47233 Glaucomatous optic atrophy, bilateral: Secondary | ICD-10-CM | POA: Diagnosis not present

## 2016-10-26 ENCOUNTER — Ambulatory Visit (INDEPENDENT_AMBULATORY_CARE_PROVIDER_SITE_OTHER): Payer: Medicare Other | Admitting: Internal Medicine

## 2016-10-26 ENCOUNTER — Encounter: Payer: Self-pay | Admitting: Internal Medicine

## 2016-10-26 VITALS — BP 144/86 | HR 69 | Ht 67.0 in | Wt 197.0 lb

## 2016-10-26 DIAGNOSIS — E785 Hyperlipidemia, unspecified: Secondary | ICD-10-CM

## 2016-10-26 DIAGNOSIS — I10 Essential (primary) hypertension: Secondary | ICD-10-CM

## 2016-10-26 DIAGNOSIS — R42 Dizziness and giddiness: Secondary | ICD-10-CM | POA: Insufficient documentation

## 2016-10-26 DIAGNOSIS — H903 Sensorineural hearing loss, bilateral: Secondary | ICD-10-CM | POA: Diagnosis not present

## 2016-10-26 NOTE — Assessment & Plan Note (Signed)
stable overall by history and exam, recent data reviewed with pt, and pt to continue medical treatment as before,  to f/u any worsening symptoms or concerns Lab Results  Component Value Date   LDLCALC 100 (H) 04/04/2016

## 2016-10-26 NOTE — Progress Notes (Signed)
Subjective:    Patient ID: [name] [name], male    DOB: [birth year], 66 y.o.   MRN: [id number]  HPI  Here to f/u; overall doing ok,  Pt denies chest pain, increasing sob or doe, wheezing, orthopnea, PND, increased LE swelling, palpitations, or syncope but has had some lightheaded and off balance in the last few wks..  Pt denies new neurological symptoms such as new headache, or facial or extremity weakness or numbness.  Pt denies polydipsia, polyuria, or low sugar episode.  Pt states overall good compliance with meds, mostly trying to follow appropriate diet, with wt overall stable,  BP at local drugstore < 140/90. Has hx of reduced ACE due to lower BP in past Wt Readings from Last 3 Encounters:  10/26/16 197 lb (89.4 kg)  08/17/16 193 lb (87.5 kg)  08/11/16 200 lb (90.7 kg)   BP Readings from Last 3 Encounters:  10/26/16 (!) 144/86  08/17/16 138/84  08/11/16 136/82  had hearing eval earlier today with suggestion of hearing aids but he will consider for now.   Sees urology every 6 mo for f/u psa Past Medical History:  Diagnosis Date  . BENIGN PROSTATIC HYPERTROPHY 05/06/2010  . COMMON MIGRAINE 07/31/2007  . GERD 07/31/2007  . HYPERLIPIDEMIA 01/04/2007  . HYPERTENSION 01/04/2007  . OVERACTIVE BLADDER 03/06/2007  . Prostate cancer ([place]) 02/04/15   No past surgical history on file.  reports that he has never smoked. He has never used smokeless tobacco. He reports that he does not drink alcohol or use drugs. family history includes Lung cancer (age of onset: 30) in his father. Allergies  Allergen Reactions  . Celecoxib Other (See Comments)    Stroke like symptoms  . Crestor [Rosuvastatin]     weakness  . Detrol [Tolterodine] Other (See Comments)    headache  . Ditropan [Oxybutynin] Other (See Comments)    Dry mouth   Current Outpatient Prescriptions on File Prior to Visit  Medication Sig Dispense Refill  . amLODipine (NORVASC) 10 MG tablet TAKE 1 TABLET EVERY DAY 90 tablet 3  .  aspirin 81 MG tablet Take 81 mg by mouth daily.     Marland Kitchen atorvastatin (LIPITOR) 10 MG tablet TAKE 1 TABLET EVERY DAY 90 tablet 3  . benazepril (LOTENSIN) 10 MG tablet Take 1 tablet (10 mg total) by mouth daily. 90 tablet 3  . cetirizine (ZYRTEC) 10 MG tablet Take 1 tablet (10 mg total) by mouth daily. 30 tablet 11  . polyethylene glycol (MIRALAX / GLYCOLAX) packet Take 17 g by mouth as needed.     . sildenafil (REVATIO) 20 MG tablet Take 3 tabs by mouth every day as needed 30 tablet 11  . Tamsulosin HCl (FLOMAX) 0.4 MG CAPS Take 1 capsule (0.4 mg total) by mouth daily. 30 capsule 11  . triamcinolone (NASACORT AQ) 55 MCG/ACT AERO nasal inhaler Place 2 sprays into the nose daily. 1 Inhaler 12   No current facility-administered medications on file prior to visit.    Review of Systems  Constitutional: Negative for other unusual diaphoresis or sweats HENT: Negative for ear discharge or swelling Eyes: Negative for other worsening visual disturbances Respiratory: Negative for stridor or other swelling  Gastrointestinal: Negative for worsening distension or other blood Genitourinary: Negative for retention or other urinary change Musculoskeletal: Negative for other MSK pain or swelling Skin: Negative for color change or other new lesions Neurological: Negative for worsening tremors and other numbness  Psychiatric/Behavioral: Negative for worsening agitation or other fatigue  Objective:   Physical Exam BP (!) 144/86   Pulse 69   Ht 5\' 7"  (1.702 m)   Wt 197 lb (89.4 kg)   SpO2 99%   BMI 30.85 kg/m  VS noted,  Constitutional: Pt appears in NAD HENT: Head: NCAT.  Right Ear: External ear normal.  Left Ear: External ear normal.  Eyes: . Pupils are equal, round, and reactive to light. Conjunctivae and EOM are normal Nose: without d/c or deformity Neck: Neck supple. Gross normal ROM Cardiovascular: Normal rate and regular rhythm.   Pulmonary/Chest: Effort normal and breath sounds without  rales or wheezing.  Abd:  Soft, NT, ND, + BS, no organomegaly Neurological: Pt is alert. At baseline orientation, motor grossly intact Skin: Skin is warm. No rashes, other new lesions, no LE edema Psychiatric: Pt behavior is normal without agitation  No other exam findings  Lab Results  Component Value Date   WBC 9.6 04/04/2016   HGB 14.2 04/04/2016   HCT 41.8 04/04/2016   PLT 268.0 04/04/2016   GLUCOSE 83 04/04/2016   CHOL 191 04/04/2016   TRIG 121.0 04/04/2016   HDL 67.00 04/04/2016   LDLDIRECT 139.2 10/29/2012   LDLCALC 100 (H) 04/04/2016   ALT 19 04/04/2016   AST 17 04/04/2016   NA 141 04/04/2016   K 3.9 04/04/2016   CL 104 04/04/2016   CREATININE 1.03 04/04/2016   BUN 13 04/04/2016   CO2 31 04/04/2016   TSH 1.01 04/04/2016   PSA 0.81 04/04/2016   INR 1.0 06/24/2007   HGBA1C  06/24/2007    5.9 (NOTE)   The ADA recommends the following therapeutic goals for glycemic   control related to Hgb A1C measurement:   Goal of Therapy:   < 7.0% Hgb A1C   Action Suggested:  > 8.0% Hgb A1C   Ref:  Diabetes Care, 22, Suppl. 1, 1999        Assessment & Plan:

## 2016-10-26 NOTE — Assessment & Plan Note (Signed)
Though slightly elevated, suspect he may be overtreated again with higher dosing BP med , to cont same tx, f/u at home and next visit

## 2016-10-26 NOTE — Assessment & Plan Note (Signed)
Etiology unclear, exam benign, had recent carotids and echo/ecg and labs in dec-jan recently, ok to follow,  to f/u any worsening symptoms or concerns

## 2016-10-26 NOTE — Patient Instructions (Signed)
Please continue all other medications as before, and refills have been done if requested.  Please have the pharmacy call with any other refills you may need.  Please continue your efforts at being more active, low cholesterol diet, and weight control.  Please keep your appointments with your specialists as you may have planned  Please return in 6 months, or sooner if needed 

## 2016-11-12 ENCOUNTER — Other Ambulatory Visit: Payer: Self-pay | Admitting: Internal Medicine

## 2016-11-20 DIAGNOSIS — H401131 Primary open-angle glaucoma, bilateral, mild stage: Secondary | ICD-10-CM | POA: Diagnosis not present

## 2016-11-20 DIAGNOSIS — H2513 Age-related nuclear cataract, bilateral: Secondary | ICD-10-CM | POA: Diagnosis not present

## 2016-11-30 DIAGNOSIS — H5703 Miosis: Secondary | ICD-10-CM | POA: Diagnosis not present

## 2016-11-30 DIAGNOSIS — H401121 Primary open-angle glaucoma, left eye, mild stage: Secondary | ICD-10-CM | POA: Diagnosis not present

## 2016-11-30 DIAGNOSIS — H2512 Age-related nuclear cataract, left eye: Secondary | ICD-10-CM | POA: Diagnosis not present

## 2017-01-15 DIAGNOSIS — C61 Malignant neoplasm of prostate: Secondary | ICD-10-CM | POA: Diagnosis not present

## 2017-01-22 DIAGNOSIS — H0011 Chalazion right upper eyelid: Secondary | ICD-10-CM | POA: Diagnosis not present

## 2017-01-22 DIAGNOSIS — C61 Malignant neoplasm of prostate: Secondary | ICD-10-CM | POA: Diagnosis not present

## 2017-01-22 DIAGNOSIS — R3915 Urgency of urination: Secondary | ICD-10-CM | POA: Diagnosis not present

## 2017-01-22 DIAGNOSIS — N5201 Erectile dysfunction due to arterial insufficiency: Secondary | ICD-10-CM | POA: Diagnosis not present

## 2017-01-22 DIAGNOSIS — R351 Nocturia: Secondary | ICD-10-CM | POA: Diagnosis not present

## 2017-02-06 ENCOUNTER — Ambulatory Visit (INDEPENDENT_AMBULATORY_CARE_PROVIDER_SITE_OTHER): Payer: Medicare Other | Admitting: Physician Assistant

## 2017-02-06 DIAGNOSIS — Z23 Encounter for immunization: Secondary | ICD-10-CM

## 2017-02-07 ENCOUNTER — Ambulatory Visit: Payer: Medicare Other | Admitting: Family Medicine

## 2017-02-26 DIAGNOSIS — H2511 Age-related nuclear cataract, right eye: Secondary | ICD-10-CM | POA: Diagnosis not present

## 2017-03-01 DIAGNOSIS — H401111 Primary open-angle glaucoma, right eye, mild stage: Secondary | ICD-10-CM | POA: Diagnosis not present

## 2017-03-01 DIAGNOSIS — H5703 Miosis: Secondary | ICD-10-CM | POA: Diagnosis not present

## 2017-03-01 DIAGNOSIS — H2511 Age-related nuclear cataract, right eye: Secondary | ICD-10-CM | POA: Diagnosis not present

## 2017-03-22 ENCOUNTER — Encounter: Payer: Self-pay | Admitting: Internal Medicine

## 2017-03-22 ENCOUNTER — Ambulatory Visit (INDEPENDENT_AMBULATORY_CARE_PROVIDER_SITE_OTHER): Payer: Medicare Other | Admitting: Internal Medicine

## 2017-03-22 ENCOUNTER — Other Ambulatory Visit (INDEPENDENT_AMBULATORY_CARE_PROVIDER_SITE_OTHER): Payer: Medicare Other

## 2017-03-22 VITALS — BP 146/90 | HR 64 | Temp 97.8°F | Ht 67.0 in | Wt 203.0 lb

## 2017-03-22 DIAGNOSIS — N320 Bladder-neck obstruction: Secondary | ICD-10-CM

## 2017-03-22 DIAGNOSIS — I10 Essential (primary) hypertension: Secondary | ICD-10-CM | POA: Diagnosis not present

## 2017-03-22 DIAGNOSIS — E785 Hyperlipidemia, unspecified: Secondary | ICD-10-CM | POA: Diagnosis not present

## 2017-03-22 DIAGNOSIS — N318 Other neuromuscular dysfunction of bladder: Secondary | ICD-10-CM | POA: Diagnosis not present

## 2017-03-22 LAB — HEPATIC FUNCTION PANEL
ALBUMIN: 4.6 g/dL (ref 3.5–5.2)
ALT: 22 U/L (ref 0–53)
AST: 22 U/L (ref 0–37)
Alkaline Phosphatase: 68 U/L (ref 39–117)
BILIRUBIN TOTAL: 1.1 mg/dL (ref 0.2–1.2)
Bilirubin, Direct: 0.2 mg/dL (ref 0.0–0.3)
Total Protein: 7.8 g/dL (ref 6.0–8.3)

## 2017-03-22 LAB — CBC WITH DIFFERENTIAL/PLATELET
BASOS PCT: 0.5 % (ref 0.0–3.0)
Basophils Absolute: 0 10*3/uL (ref 0.0–0.1)
Eosinophils Absolute: 0.1 10*3/uL (ref 0.0–0.7)
Eosinophils Relative: 0.8 % (ref 0.0–5.0)
HCT: 43.5 % (ref 39.0–52.0)
Hemoglobin: 14.7 g/dL (ref 13.0–17.0)
LYMPHS ABS: 1.6 10*3/uL (ref 0.7–4.0)
Lymphocytes Relative: 21.6 % (ref 12.0–46.0)
MCHC: 33.9 g/dL (ref 30.0–36.0)
MCV: 92.3 fl (ref 78.0–100.0)
MONO ABS: 0.7 10*3/uL (ref 0.1–1.0)
MONOS PCT: 9.7 % (ref 3.0–12.0)
NEUTROS ABS: 5.1 10*3/uL (ref 1.4–7.7)
NEUTROS PCT: 67.4 % (ref 43.0–77.0)
Platelets: 288 10*3/uL (ref 150.0–400.0)
RBC: 4.71 Mil/uL (ref 4.22–5.81)
RDW: 14.1 % (ref 11.5–15.5)
WBC: 7.5 10*3/uL (ref 4.0–10.5)

## 2017-03-22 LAB — PSA: PSA: 0.6 ng/mL (ref 0.10–4.00)

## 2017-03-22 LAB — URINALYSIS, ROUTINE W REFLEX MICROSCOPIC
Bilirubin Urine: NEGATIVE
KETONES UR: NEGATIVE
LEUKOCYTES UA: NEGATIVE
Nitrite: NEGATIVE
SPECIFIC GRAVITY, URINE: 1.015 (ref 1.000–1.030)
TOTAL PROTEIN, URINE-UPE24: NEGATIVE
URINE GLUCOSE: NEGATIVE
UROBILINOGEN UA: 0.2 (ref 0.0–1.0)
pH: 7.5 (ref 5.0–8.0)

## 2017-03-22 LAB — BASIC METABOLIC PANEL
BUN: 15 mg/dL (ref 6–23)
CALCIUM: 10.1 mg/dL (ref 8.4–10.5)
CO2: 30 meq/L (ref 19–32)
Chloride: 100 mEq/L (ref 96–112)
Creatinine, Ser: 1.18 mg/dL (ref 0.40–1.50)
GFR: 78.75 mL/min (ref >60.00)
Glucose, Bld: 93 mg/dL (ref 70–99)
Potassium: 3.7 mEq/L (ref 3.5–5.1)
Sodium: 138 mEq/L (ref 135–145)

## 2017-03-22 LAB — LIPID PANEL
CHOLESTEROL: 185 mg/dL (ref 0–200)
HDL: 65.9 mg/dL (ref >39.00)
LDL CALC: 103 mg/dL — AB (ref 0–99)
NonHDL: 119.32
TRIGLYCERIDES: 83 mg/dL (ref 0.0–149.0)
Total CHOL/HDL Ratio: 3
VLDL: 16.6 mg/dL (ref 0.0–40.0)

## 2017-03-22 LAB — TSH: TSH: 1.37 u[IU]/mL (ref 0.35–4.50)

## 2017-03-22 MED ORDER — AMLODIPINE BESYLATE 10 MG PO TABS: 10.0000 mg | ORAL_TABLET | Freq: Every day | ORAL | 3 refills | Status: DC

## 2017-03-22 MED ORDER — BENAZEPRIL HCL 10 MG PO TABS: 10.0000 mg | ORAL_TABLET | Freq: Every day | ORAL | 3 refills | Status: DC

## 2017-03-22 MED ORDER — MIRABEGRON ER 25 MG PO TB24: 25.0000 mg | ORAL_TABLET | Freq: Every day | ORAL | 3 refills | Status: DC

## 2017-03-22 NOTE — Progress Notes (Signed)
Subjective:    Patient ID: [name] [name], male    DOB: August 26, 1950, 66 y.o.   MRN: [id number]  HPI  Here for f/u;  Overall doing ok;  Pt denies Chest pain, worsening SOB, DOE, wheezing, orthopnea, PND, worsening LE edema, palpitations, dizziness or syncope.  Pt denies neurological change such as new headache, facial or extremity weakness.  Pt denies polydipsia, polyuria, or low sugar symptoms. Pt states overall good compliance with treatment and medications, good tolerability, and has been trying to follow appropriate diet.  Pt denies worsening depressive symptoms, suicidal ideation or panic. No fever, night sweats, wt loss, loss of appetite, or other constitutional symptoms.  Pt states good ability with ADL's, has low fall risk, home safety reviewed and adequate, no other significant changes in hearing or vision, and only occasionally active with exercise.  Has BP cuff at home, not done recently, had been having low BP when meds were previously increased, with dizziness. BP Readings from Last 3 Encounters:  03/22/17 (!) 146/90  10/26/16 (!) 144/86  08/17/16 138/84  Still having urinary frequency c/w OAB . Denies urinary symptoms such as dysuria, urgency, flank pain, hematuria or n/v, fever, chills. Previous medication caused dry mouth , needs new urology as last has retired Past Medical History:  Diagnosis Date  . BENIGN PROSTATIC HYPERTROPHY 05/06/2010  . COMMON MIGRAINE 07/31/2007  . GERD 07/31/2007  . HYPERLIPIDEMIA 01/04/2007  . HYPERTENSION 01/04/2007  . OVERACTIVE BLADDER 03/06/2007  . Prostate cancer ([place]) 02/04/15   History reviewed. No pertinent surgical history.  reports that  has never smoked. he has never used smokeless tobacco. He reports that he does not drink alcohol or use drugs. family history includes Lung cancer (age of onset: 32) in his father. Allergies  Allergen Reactions  . Celecoxib Other (See Comments)    Stroke like symptoms  . Crestor [Rosuvastatin]     weakness    . Detrol [Tolterodine] Other (See Comments)    headache  . Ditropan [Oxybutynin] Other (See Comments)    Dry mouth   Current Outpatient Medications on File Prior to Visit  Medication Sig Dispense Refill  . aspirin 81 MG tablet Take 81 mg by mouth daily.     Marland Kitchen atorvastatin (LIPITOR) 10 MG tablet TAKE 1 TABLET EVERY DAY 90 tablet 1  . cetirizine (ZYRTEC) 10 MG tablet Take 1 tablet (10 mg total) by mouth daily. 30 tablet 11  . polyethylene glycol (MIRALAX / GLYCOLAX) packet Take 17 g by mouth as needed.     . sildenafil (REVATIO) 20 MG tablet Take 3 tabs by mouth every day as needed 30 tablet 11  . Tamsulosin HCl (FLOMAX) 0.4 MG CAPS Take 1 capsule (0.4 mg total) by mouth daily. 30 capsule 11  . triamcinolone (NASACORT AQ) 55 MCG/ACT AERO nasal inhaler Place 2 sprays into the nose daily. 1 Inhaler 12   No current facility-administered medications on file prior to visit.    Review of Systems  Constitutional: Negative for other unusual diaphoresis or sweats HENT: Negative for ear discharge or swelling Eyes: Negative for other worsening visual disturbances Respiratory: Negative for stridor or other swelling  Gastrointestinal: Negative for worsening distension or other blood Genitourinary: Negative for retention or other urinary change Musculoskeletal: Negative for other MSK pain or swelling Skin: Negative for color change or other new lesions Neurological: Negative for worsening tremors and other numbness  Psychiatric/Behavioral: Negative for worsening agitation or other fatigue All other system neg per pt    Objective:  Physical Exam BP (!) 146/90   Pulse 64   Temp 97.8 F (36.6 C) (Oral)   Ht 5\' 7"  (1.702 m)   Wt 203 lb (92.1 kg)   SpO2 99%   BMI 31.79 kg/m  VS noted,  Constitutional: Pt appears in NAD HENT: Head: NCAT.  Right Ear: External ear normal.  Left Ear: External ear normal.  Eyes: . Pupils are equal, round, and reactive to light. Conjunctivae and EOM are  normal Nose: without d/c or deformity Neck: Neck supple. Gross normal ROM Cardiovascular: Normal rate and regular rhythm.   Pulmonary/Chest: Effort normal and breath sounds without rales or wheezing.  Abd:  Soft, NT, ND, + BS, no organomegaly Neurological: Pt is alert. At baseline orientation, motor grossly intact Skin: Skin is warm. No rashes, other new lesions, no LE edema Psychiatric: Pt behavior is normal without agitation  No other exam findings     Assessment & Plan:

## 2017-03-22 NOTE — Patient Instructions (Signed)
Please take all new medication as prescribed - the bladder medication  Please continue all other medications as before, and refills have been done if requested.  Please have the pharmacy call with any other refills you may need.  Please continue your efforts at being more active, low cholesterol diet, and weight control.  You are otherwise up to date with prevention measures today.  Please keep your appointments with your specialists as you may have planned  You will be contacted regarding the referral for: Urology  Please go to the LAB in the Basement (turn left off the elevator) for the tests to be done today  You will be contacted by phone if any changes need to be made immediately.  Otherwise, you will receive a letter about your results with an explanation, but please check with MyChart first.  Please remember to sign up for MyChart if you have not done so, as this will be important to you in the future with finding out test results, communicating by private email, and scheduling acute appointments online when needed.  Please return in 6 months, or sooner if needed

## 2017-03-24 ENCOUNTER — Encounter: Payer: Self-pay | Admitting: Internal Medicine

## 2017-03-25 ENCOUNTER — Encounter: Payer: Self-pay | Admitting: Internal Medicine

## 2017-03-25 NOTE — Assessment & Plan Note (Signed)
Ok for change to myrbetrix asd, check ua, and refer new urology per pt request

## 2017-03-25 NOTE — Assessment & Plan Note (Signed)
Lab Results  Component Value Date   LDLCALC 103 (H) 03/22/2017  stable overall by history and exam, recent data reviewed with pt, and pt to continue medical treatment as before,  to f/u any worsening symptoms or concerns

## 2017-03-25 NOTE — Assessment & Plan Note (Signed)
States BP at home < 140/90, o/w stable overall by history and exam, recent data reviewed with pt, and pt to continue medical treatment as before - declines change in med tx for now,  to f/u any worsening symptoms or concerns  BP Readings from Last 3 Encounters:  03/22/17 (!) 146/90  10/26/16 (!) 144/86  08/17/16 138/84

## 2017-04-09 ENCOUNTER — Encounter: Payer: Self-pay | Admitting: Internal Medicine

## 2017-04-17 DIAGNOSIS — C61 Malignant neoplasm of prostate: Secondary | ICD-10-CM | POA: Diagnosis not present

## 2017-04-17 DIAGNOSIS — R3915 Urgency of urination: Secondary | ICD-10-CM | POA: Diagnosis not present

## 2017-04-17 DIAGNOSIS — R351 Nocturia: Secondary | ICD-10-CM | POA: Diagnosis not present

## 2017-04-19 ENCOUNTER — Ambulatory Visit (INDEPENDENT_AMBULATORY_CARE_PROVIDER_SITE_OTHER)
Admission: RE | Admit: 2017-04-19 | Discharge: 2017-04-19 | Disposition: A | Discharge status: Home / Self Care | Payer: Medicare Other | Source: Ambulatory Visit | Attending: Internal Medicine | Admitting: Internal Medicine

## 2017-04-19 ENCOUNTER — Encounter: Payer: Self-pay | Admitting: Internal Medicine

## 2017-04-19 ENCOUNTER — Ambulatory Visit (INDEPENDENT_AMBULATORY_CARE_PROVIDER_SITE_OTHER): Payer: Medicare Other | Admitting: Internal Medicine

## 2017-04-19 VITALS — BP 132/84 | HR 73 | Temp 97.8°F | Ht 67.0 in | Wt 203.0 lb

## 2017-04-19 DIAGNOSIS — M545 Low back pain, unspecified: Secondary | ICD-10-CM | POA: Insufficient documentation

## 2017-04-19 DIAGNOSIS — M5441 Lumbago with sciatica, right side: Secondary | ICD-10-CM

## 2017-04-19 DIAGNOSIS — I10 Essential (primary) hypertension: Secondary | ICD-10-CM | POA: Diagnosis not present

## 2017-04-19 DIAGNOSIS — N318 Other neuromuscular dysfunction of bladder: Secondary | ICD-10-CM

## 2017-04-19 MED ORDER — TRAMADOL HCL 50 MG PO TABS: 50.0000 mg | ORAL_TABLET | Freq: Three times a day (TID) | ORAL | 1 refills | Status: DC | PRN

## 2017-04-19 MED ORDER — PREDNISONE 10 MG PO TABS: ORAL_TABLET | ORAL | 0 refills | Status: DC

## 2017-04-19 MED ORDER — GABAPENTIN 100 MG PO CAPS: 100.0000 mg | ORAL_CAPSULE | Freq: Three times a day (TID) | ORAL | 3 refills | Status: DC

## 2017-04-19 MED ORDER — FESOTERODINE FUMARATE ER 8 MG PO TB24: 8.0000 mg | ORAL_TABLET | Freq: Every day | ORAL | 3 refills | Status: DC

## 2017-04-19 NOTE — Progress Notes (Signed)
Subjective:    Patient ID: [name] [name], male    DOB: [birth year], 66 y.o.   MRN: [id number]  HPI  Here with LBP x 3 days, started on first out of bed with a slight stiffness that seemed to resolve quickly, but then 30 min later had onset Right LBP, sharp, severe, mostly midline with radiation to the right post upper leg and buttock, worse to sit, better to be standing up and walking, and no LE numbness or weakness. Pt denies bowel or bladder change, fever, wt loss, gait change or falls.  Denies urinary symptoms such as dysuria, urgency, flank pain, hematuria or n/v, fever, chills, but still has marked urinary freq as the last rx of myrbetrix was not covered by insurance, and has had side effect with oxybutinin in the past.  Would like to try other med.  Pt denies chest pain, increased sob or doe, wheezing, orthopnea, PND, increased LE swelling, palpitations, dizziness or syncope. Past Medical History:  Diagnosis Date  . BENIGN PROSTATIC HYPERTROPHY 05/06/2010  . COMMON MIGRAINE 07/31/2007  . GERD 07/31/2007  . HYPERLIPIDEMIA 01/04/2007  . HYPERTENSION 01/04/2007  . OVERACTIVE BLADDER 03/06/2007  . Prostate cancer ([place]) 02/04/15   History reviewed. No pertinent surgical history.  reports that  has never smoked. he has never used smokeless tobacco. He reports that he does not drink alcohol or use drugs. family history includes Lung cancer (age of onset: 75) in his father. Allergies  Allergen Reactions  . Celecoxib Other (See Comments)    Stroke like symptoms  . Crestor [Rosuvastatin]     weakness  . Detrol [Tolterodine] Other (See Comments)    headache  . Ditropan [Oxybutynin] Other (See Comments)    Dry mouth   Current Outpatient Medications on File Prior to Visit  Medication Sig Dispense Refill  . amLODipine (NORVASC) 10 MG tablet Take 1 tablet (10 mg total) daily by mouth. 90 tablet 3  . aspirin 81 MG tablet Take 81 mg by mouth daily.     Marland Kitchen atorvastatin (LIPITOR) 10 MG tablet TAKE 1  TABLET EVERY DAY 90 tablet 1  . benazepril (LOTENSIN) 10 MG tablet Take 1 tablet (10 mg total) daily by mouth. 90 tablet 3  . polyethylene glycol (MIRALAX / GLYCOLAX) packet Take 17 g by mouth as needed.     . sildenafil (REVATIO) 20 MG tablet Take 3 tabs by mouth every day as needed 30 tablet 11  . Tamsulosin HCl (FLOMAX) 0.4 MG CAPS Take 1 capsule (0.4 mg total) by mouth daily. 30 capsule 11   No current facility-administered medications on file prior to visit.    Review of Systems  Constitutional: Negative for other unusual diaphoresis or sweats HENT: Negative for ear discharge or swelling Eyes: Negative for other worsening visual disturbances Respiratory: Negative for stridor or other swelling  Gastrointestinal: Negative for worsening distension or other blood Genitourinary: Negative for retention or other urinary change Musculoskeletal: Negative for other MSK pain or swelling Skin: Negative for color change or other new lesions Neurological: Negative for worsening tremors and other numbness  Psychiatric/Behavioral: Negative for worsening agitation or other fatigue All other system neg per pt    Objective:   Physical Exam BP 132/84   Pulse 73   Temp 97.8 F (36.6 C) (Oral)   Ht 5\' 7"  (1.702 m)   Wt 203 lb (92.1 kg)   SpO2 99%   BMI 31.79 kg/m  VS noted,  Constitutional: Pt appears in NAD HENT: Head: NCAT.  Right Ear: External ear normal.  Left Ear: External ear normal.  Eyes: . Pupils are equal, round, and reactive to light. Conjunctivae and EOM are normal Nose: without d/c or deformity Neck: Neck supple. Gross normal ROM Cardiovascular: Normal rate and regular rhythm.   Pulmonary/Chest: Effort normal and breath sounds without rales or wheezing.  Abd:  Soft, NT, ND, + BS, no organomegaly Spine with lowest midline lumbar tender and right paravertebral and buttock as well Neurological: Pt is alert. At baseline orientation, motor 5/5 intact, no LE sens decreased Skin:  Skin is warm. No rashes, other new lesions, no LE edema Psychiatric: Pt behavior is normal without agitation  No other exam findings    Assessment & Plan:

## 2017-04-19 NOTE — Patient Instructions (Addendum)
Please take all new medication as prescribed - the [place] for the bladder  Please have the pharmacy call us with a similar medication if Lisbeth Ply is not covered with your insurance  Please take all new medication as prescribed  - the tramadol for pain, prednisone, and gabapentin  We can consider Physical Therapy if not improved in 1-2 wks  Please return in 3-4 weeks if not improved, to consider MRI  Please continue all other medications as before, and refills have been done if requested.  Please have the pharmacy call with any other refills you may need.  Please keep your appointments with your specialists as you may have planned  Please go to the XRAY Department in the Basement (go straight as you get off the elevator) for the x-ray testing  You will be contacted by phone if any changes need to be made immediately.  Otherwise, you will receive a letter about your results with an explanation, but please check with MyChart first.  Please remember to sign up for MyChart if you have not done so, as this will be important to you in the future with finding out test results, communicating by private email, and scheduling acute appointments online when needed.

## 2017-04-19 NOTE — Assessment & Plan Note (Signed)
stable overall by history and exam, recent data reviewed with pt, and pt to continue medical treatment as before,  to f/u any worsening symptoms or concerns BP Readings from Last 3 Encounters:  04/19/17 132/84  03/22/17 (!) 146/90  10/26/16 (!) 144/86

## 2017-04-19 NOTE — Assessment & Plan Note (Signed)
Hx and exam c/w probable flare of underlying possible lumbar DJD or DDD; no specific neuro changes, so will check plain films as has never been done previously, but o/w tx conservatively pending clinical improvement, for tramadol, predpac asd, and gabapentin if needed; consider MRI for persistent or worsening symptoms

## 2017-04-19 NOTE — Assessment & Plan Note (Signed)
[place] for [facility] asd, with pt encouraged to have pharmacy call if not improved with similar alternative

## 2017-04-25 ENCOUNTER — Telehealth: Payer: Self-pay

## 2017-04-25 NOTE — Telephone Encounter (Signed)
Denied  Per Medicare drugs used for erectile dysfunction are excluded from medicare part d coverage.   Determination has been sent to scan.

## 2017-04-27 DIAGNOSIS — H26492 Other secondary cataract, left eye: Secondary | ICD-10-CM | POA: Diagnosis not present

## 2017-05-18 ENCOUNTER — Encounter: Payer: Self-pay | Admitting: Internal Medicine

## 2017-05-18 MED ORDER — AMLODIPINE BESYLATE 10 MG PO TABS: 10.0000 mg | ORAL_TABLET | Freq: Every day | ORAL | 1 refills | Status: DC

## 2017-05-18 MED ORDER — BENAZEPRIL HCL 10 MG PO TABS: 10.0000 mg | ORAL_TABLET | Freq: Every day | ORAL | 1 refills | Status: DC

## 2017-05-18 MED ORDER — ATORVASTATIN CALCIUM 10 MG PO TABS: 10.0000 mg | ORAL_TABLET | Freq: Every day | ORAL | 1 refills | Status: DC

## 2017-05-21 ENCOUNTER — Telehealth: Payer: Self-pay

## 2017-05-21 NOTE — Telephone Encounter (Signed)
Denied per insurance  Medicare part D rules do not allow coverage of drugs being used for erectile and sexual dysfunction of decreased libido.   Determination has been sent to scan.

## 2017-06-06 DIAGNOSIS — Z961 Presence of intraocular lens: Secondary | ICD-10-CM | POA: Diagnosis not present

## 2017-06-06 DIAGNOSIS — H401131 Primary open-angle glaucoma, bilateral, mild stage: Secondary | ICD-10-CM | POA: Diagnosis not present

## 2017-07-04 ENCOUNTER — Encounter: Payer: Self-pay | Admitting: Internal Medicine

## 2017-07-04 MED ORDER — MIRABEGRON ER 25 MG PO TB24: 25.0000 mg | ORAL_TABLET | Freq: Every day | ORAL | 3 refills | Status: DC

## 2017-07-04 NOTE — Telephone Encounter (Signed)
Ok for change toviaz to myrbetrix - done erx

## 2017-07-13 DIAGNOSIS — H0011 Chalazion right upper eyelid: Secondary | ICD-10-CM | POA: Diagnosis not present

## 2017-07-13 DIAGNOSIS — H401131 Primary open-angle glaucoma, bilateral, mild stage: Secondary | ICD-10-CM | POA: Diagnosis not present

## 2017-08-10 DIAGNOSIS — H401131 Primary open-angle glaucoma, bilateral, mild stage: Secondary | ICD-10-CM | POA: Diagnosis not present

## 2017-08-10 DIAGNOSIS — H0011 Chalazion right upper eyelid: Secondary | ICD-10-CM | POA: Diagnosis not present

## 2017-08-10 DIAGNOSIS — Z961 Presence of intraocular lens: Secondary | ICD-10-CM | POA: Diagnosis not present

## 2017-08-13 ENCOUNTER — Ambulatory Visit (INDEPENDENT_AMBULATORY_CARE_PROVIDER_SITE_OTHER): Payer: Medicare Other | Admitting: *Deleted

## 2017-08-13 ENCOUNTER — Telehealth: Payer: Self-pay | Admitting: *Deleted

## 2017-08-13 VITALS — BP 158/86 | HR 60 | Resp 18 | Ht 67.0 in | Wt 206.0 lb

## 2017-08-13 DIAGNOSIS — Z0000 Encounter for general adult medical examination without abnormal findings: Secondary | ICD-10-CM

## 2017-08-13 NOTE — Telephone Encounter (Signed)
Ok to continue same tx, as these side effects must be extremely rare as I have not had these complaints from other patients  Remember, the internet lists everything someone complained out, without having to prove it actually was caused by the medication

## 2017-08-13 NOTE — Progress Notes (Addendum)
Subjective:   [name] [name] is a 67 y.o. male who presents for Medicare Annual/Subsequent preventive examination.  Review of Systems:  No ROS.  Medicare Wellness Visit. Additional risk factors are reflected in the social history.  Cardiac Risk Factors include: advanced age (>12men, >39 women);dyslipidemia;hypertension Sleep patterns: feels rested on waking, gets up 4 times nightly to void and sleeps 6 hours nightly.   Home Safety/Smoke Alarms: Feels safe in home. Smoke alarms in place.  Living environment; residence and Firearm Safety: 1-story house/ trailer, no firearms. Lives with wife, no needs for DME, good support system Seat Belt Safety/Bike Helmet: Wears seat belt.   PSA-  Lab Results  Component Value Date   PSA 0.60 03/22/2017   PSA 0.81 04/04/2016   PSA 5.39 (H) 03/26/2015       Objective:    Vitals: BP (!) 158/86   Pulse 60   Resp 18   Ht 5\' 7"  (1.702 m)   Wt 206 lb (93.4 kg)   SpO2 99%   BMI 32.26 kg/m   Body mass index is 32.26 kg/m.  Advanced Directives 08/13/2017 08/11/2016 08/05/2015 02/23/2015  Does Patient Have a Medical Advance Directive? No No No No  Type of Academic librarian;Living will - - -  Copy of [place] in Chart? No - copy requested - - -  Would patient like information on creating a medical advance directive? - Yes (ED - Information included in AVS) Yes - Educational materials given No - patient declined information    Tobacco Social History   Tobacco Use  Smoking Status Never Smoker  Smokeless Tobacco Never Used     Counseling given: Not Answered  Past Medical History:  Diagnosis Date  . BENIGN PROSTATIC HYPERTROPHY 05/06/2010  . COMMON MIGRAINE 07/31/2007  . GERD 07/31/2007  . HYPERLIPIDEMIA 01/04/2007  . HYPERTENSION 01/04/2007  . OVERACTIVE BLADDER 03/06/2007  . Prostate cancer ([place]) 02/04/15   No past surgical history on file. Family History  Problem Relation Age of Onset  .  Lung cancer Father 93   Social History   Socioeconomic History  . Marital status: Married    Spouse name: Not on file  . Number of children: 3  . Years of education: Not on file  . Highest education level: Not on file  Occupational History  . Not on file  Social Needs  . Financial resource strain: Not hard at all  . Food insecurity:    Worry: Never true    Inability: Never true  . Transportation needs:    Medical: No    Non-medical: No  Tobacco Use  . Smoking status: Never Smoker  . Smokeless tobacco: Never Used  Substance and Sexual Activity  . Alcohol use: No    Alcohol/week: 0.0 oz  . Drug use: No  . Sexual activity: Yes  Lifestyle  . Physical activity:    Days per week: 0 days    Minutes per session: 0 min  . Stress: Not at all  Relationships  . Social connections:    Talks on phone: More than three times a week    Gets together: More than three times a week    Attends religious service: More than 4 times per year    Active member of club or organization: Yes    Attends meetings of clubs or organizations: More than 4 times per year    Relationship status: Married  Other Topics Concern  . Not on file  Social History Narrative  . Not on file    Outpatient Encounter Medications as of 08/13/2017  Medication Sig  . amLODipine (NORVASC) 10 MG tablet Take 1 tablet (10 mg total) by mouth daily.  Marland Kitchen aspirin 81 MG tablet Take 81 mg by mouth daily.   Marland Kitchen atorvastatin (LIPITOR) 10 MG tablet Take 1 tablet (10 mg total) by mouth daily.  . benazepril (LOTENSIN) 10 MG tablet Take 1 tablet (10 mg total) by mouth daily.  . mirabegron ER (MYRBETRIQ) 25 MG TB24 tablet Take 1 tablet (25 mg total) by mouth daily.  . polyethylene glycol (MIRALAX / GLYCOLAX) packet Take 17 g by mouth as needed.   . sildenafil (REVATIO) 20 MG tablet Take 3 tabs by mouth every day as needed  . Tamsulosin HCl (FLOMAX) 0.4 MG CAPS Take 1 capsule (0.4 mg total) by mouth daily.  . [DISCONTINUED] gabapentin  (NEURONTIN) 100 MG capsule Take 1 capsule (100 mg total) by mouth 3 (three) times daily. (Patient not taking: Reported on 08/13/2017)  . [DISCONTINUED] predniSONE (DELTASONE) 10 MG tablet 3 tabs by mouth per day for 3 days,2tabs per day for 3 days,1tab per day for 3 days (Patient not taking: Reported on 08/13/2017)  . [DISCONTINUED] traMADol (ULTRAM) 50 MG tablet Take 1 tablet (50 mg total) by mouth every 8 (eight) hours as needed. (Patient not taking: Reported on 08/13/2017)   No facility-administered encounter medications on file as of 08/13/2017.     Activities of Daily Living In your present state of health, do you have any difficulty performing the following activities: 08/13/2017  Hearing? N  Vision? N  Difficulty concentrating or making decisions? N  Walking or climbing stairs? N  Dressing or bathing? N  Doing errands, shopping? N  Preparing Food and eating ? N  Using the Toilet? N  In the past six months, have you accidently leaked urine? Y  Do you have problems with loss of bowel control? N  Managing your Medications? N  Managing your Finances? N  Housekeeping or managing your Housekeeping? N  Some recent data might be hidden    Patient Care Team: Biagio Borg, MD as PCP - General   Assessment:   This is a routine wellness examination for [name] [name]. Physical assessment deferred to PCP.   Exercise Activities and Dietary recommendations Current Exercise Habits: The patient does not participate in regular exercise at present  Diet (meal preparation, eat out, water intake, caffeinated beverages, dairy products, fruits and vegetables): in general, a "healthy" diet  , well balanced   Reviewed heart healthy diet, encouraged patient to increase daily water intake. Discussed weight loss strategies, Diet education was attached to patient's AVS and vis handout. Goals    . Patient Stated     Lose weight by monitoring my diet and eat healthy, getting back on my treadmill, enjoy life family,  worship God.       Fall Risk Fall Risk  08/13/2017 03/22/2017 08/11/2016 04/04/2016 08/05/2015  Falls in the past year? No No No No No    Depression Screen PHQ 2/9 Scores 08/13/2017 03/22/2017 08/11/2016 04/04/2016  PHQ - 2 Score 0 0 0 0  PHQ- 9 Score 0 - - -    Cognitive Function       Ad8 score reviewed for issues:  Issues making decisions: no  Less interest in hobbies / activities: no  Repeats questions, stories (family complaining): no  Trouble using ordinary gadgets (microwave, computer, phone):no  Forgets the month or year: no  Mismanaging finances: no  Remembering appts: no  Daily problems with thinking and/or memory: no Ad8 score is= 0  Immunization History  Administered Date(s) Administered  . Influenza Split 03/15/2012  . Influenza Whole 03/08/2006, 03/09/2008, 02/05/2009  . Influenza,inj,Quad PF,6+ Mos 02/03/2013, 02/27/2014, 02/23/2015, 02/24/2016, 02/06/2017  . Pneumococcal Conjugate-13 07/15/2013, 04/08/2014  . Pneumococcal Polysaccharide-23 09/23/2015  . Td 04/20/2008  . Zoster 05/06/2010   Screening Tests Health Maintenance  Topic Date Due  . COLONOSCOPY  10/21/2017  . INFLUENZA VACCINE  12/06/2017  . TETANUS/TDAP  04/20/2018  . Hepatitis C Screening  Completed  . PNA vac Low Risk Adult  Completed        Plan:    Continue doing brain stimulating activities (puzzles, reading, adult coloring books, staying active) to keep memory sharp.   Continue to eat heart healthy diet (full of fruits, vegetables, whole grains, lean protein, water--limit salt, fat, and sugar intake) and increase physical activity as tolerated.  I have personally reviewed and noted the following in the patient's chart:   . Medical and social history . Use of alcohol, tobacco or illicit drugs  . Current medications and supplements . Functional ability and status . Nutritional status . Physical activity . Advanced directives . List of other  physicians . Vitals . Screenings to include cognitive, depression, and falls . Referrals and appointments  In addition, I have reviewed and discussed with patient certain preventive protocols, quality metrics, and best practice recommendations. A written personalized care plan for preventive services as well as general preventive health recommendations were provided to patient.     Michiel Cowboy, RN  08/13/2017  Medical screening examination/treatment/procedure(s) were performed by non-physician practitioner and as supervising physician I was immediately available for consultation/collaboration. I agree with above. [name], MD

## 2017-08-13 NOTE — Patient Instructions (Signed)
Continue doing brain stimulating activities (puzzles, reading, adult coloring books, staying active) to keep memory sharp.   Continue to eat heart healthy diet (full of fruits, vegetables, whole grains, lean protein, water--limit salt, fat, and sugar intake) and increase physical activity as tolerated.   [name] [name] , Thank you for taking time to come for your Medicare Wellness Visit. I appreciate your ongoing commitment to your health goals. Please review the following plan we discussed and let me know if I can assist you in the future.   These are the goals we discussed: Goals    . Patient Stated     Lose weight by monitoring my diet and eat healthy, getting back on my treadmill, enjoy life family, worship God.       This is a list of the screening recommended for you and due dates:  Health Maintenance  Topic Date Due  . Colon Cancer Screening  10/21/2017  . Flu Shot  12/06/2017  . Tetanus Vaccine  04/20/2018  .  Hepatitis C: One time screening is recommended by Center for Disease Control  (CDC) for  adults born from [birth year] through 1967.   Completed  . Pneumonia vaccines  Completed    Protein Content in Foods Generally, most healthy people need around 50 grams of protein each day. Depending on your overall health, you may need more or less protein in your diet. Talk to your health care provider or dietitian about how much protein you need. See the following list for the protein content of some common foods. High-protein foods High-protein foods contain 4 grams (4 g) or more of protein per serving. They include:  Beef, ground sirloin (cooked) - 3 oz have 24 g of protein.  Cheese (hard) - 1 oz has 7 g of protein.  Chicken breast, boneless and skinless (cooked) - 3 oz have 13.4 g of protein.  Cottage cheese - 1/2 cup has 13.4 g of protein.  Egg - 1 egg has 6 g of protein.  Fish, filet (cooked) - 1 oz has 6-7 g of protein.  Garbanzo beans (canned or cooked) - 1/2 cup has 6-7 g  of protein.  Kidney beans (canned or cooked) - 1/2 cup has 6-7 g of protein.  Lamb (cooked) - 3 oz has 24 g of protein.  Milk - 1 cup (8 oz) has 8 g of protein.  Nuts (peanuts, pistachios, almonds) - 1 oz has 6 g of protein.  Peanut butter - 1 oz has 7-8 g of protein.  Pork tenderloin (cooked) - 3 oz has 18.4 g of protein.  Pumpkin seeds - 1 oz has 8.5 g of protein.  Soybeans (roasted) - 1 oz has 8 g of protein.  Soybeans (cooked) - 1/2 cup has 11 g of protein.  Soy milk - 1 cup (8 oz) has 5-10 g of protein.  Soy or vegetable patty - 1 patty has 11 g of protein.  Sunflower seeds - 1 oz has 5.5 g of protein.  Tofu (firm) - 1/2 cup has 20 g of protein.  Tuna (canned in water) - 3 oz has 20 g of protein.  Yogurt - 6 oz has 8 g of protein.  Low-protein foods Low-protein foods contain 3 grams (3 g) or less of protein per serving. They include:  Beets (raw or cooked) - 1/2 cup has 1.5 g of protein.  Bran cereal - 1/2 cup has 2-3 g of protein.  Bread - 1 slice has 2.5 g of protein.  Broccoli (raw or cooked) - 1/2 cup has 2 g of protein.  Collard greens (raw or cooked) - 1/2 cup has 2 g of protein.  Corn (fresh or cooked) - 1/2 cup has 2 g of protein.  Cream cheese - 1 oz has 2 g of protein.  Creamer (half-and-half) - 1 oz has 1 g of protein.  Flour tortilla - 1 tortilla has 2.5 g of protein  Frozen yogurt - 1/2 cup has 3 g of protein.  Fruit or vegetable juice - 1/2 cup has 1 g of protein.  Green beans (raw or cooked) - 1/2 cup has 1 g of protein.  Green peas (canned) - 1/2 cup has 3.5 g of protein.  Muffins - 1 small muffin (2 oz) has 3 g of protein.  Oatmeal (cooked) - 1/2 cup has 3 g of protein.  Potato (baked with skin) - 1 medium potato has 3 g of protein.  Rice (cooked) - 1/2 cup has 2.5-3.5 g of protein.  Sour cream - 1/2 cup has 2.5 g of protein.  Spinach (cooked) - 1/2 cup has 3 g of protein.  Squash (cooked) - 1/2 cup has 1.5 g of  protein.  Actual amounts of protein may be different depending on processing. Talk with your health care provider or dietitian about what foods are recommended for you. This information is not intended to replace advice given to you by your health care provider. Make sure you discuss any questions you have with your health care provider. Document Released: 07/24/2015 Document Revised: 01/03/2016 Document Reviewed: 01/03/2016 Elsevier Interactive Patient Education  2018 [place] refers to food and lifestyle choices that are based on the traditions of countries located on the [employer]. This way of eating has been shown to help prevent certain conditions and improve outcomes for people who have chronic diseases, like kidney disease and heart disease. What are tips for following this plan? Lifestyle  Cook and eat meals together with your family, when possible.  Drink enough fluid to keep your urine clear or pale yellow.  Be physically active every day. This includes: ? Aerobic exercise like running or swimming. ? Leisure activities like gardening, walking, or housework.  Get 7-8 hours of sleep each night.  If recommended by your health care provider, drink red Scotti Kosta in moderation. This means 1 glass a day for nonpregnant women and 2 glasses a day for men. A glass of Chavie Kolinski equals 5 oz (150 mL). Reading food labels  Check the serving size of packaged foods. For foods such as rice and pasta, the serving size refers to the amount of cooked product, not dry.  Check the total fat in packaged foods. Avoid foods that have saturated fat or trans fats.  Check the ingredients list for added sugars, such as corn syrup. Shopping  At the grocery store, buy most of your food from the areas near the walls of the store. This includes: ? Fresh fruits and vegetables (produce). ? Grains, beans, nuts, and seeds. Some of these may be available in  unpackaged forms or large amounts (in bulk). ? Fresh seafood. ? Poultry and eggs. ? Low-fat dairy products.  Buy whole ingredients instead of prepackaged foods.  Buy fresh fruits and vegetables in-season from local farmers markets.  Buy frozen fruits and vegetables in resealable bags.  If you do not have access to quality fresh seafood, buy precooked frozen shrimp or canned fish, such as tuna, salmon, or sardines.  Buy small amounts of raw or cooked vegetables, salads, or olives from the deli or salad bar at your store.  Stock your pantry so you always have certain foods on hand, such as olive oil, canned tuna, canned tomatoes, rice, pasta, and beans. Cooking  Cook foods with extra-virgin olive oil instead of using butter or other vegetable oils.  Have meat as a side dish, and have vegetables or grains as your main dish. This means having meat in small portions or adding small amounts of meat to foods like pasta or stew.  Use beans or vegetables instead of meat in common dishes like chili or lasagna.  Experiment with different cooking methods. Try roasting or broiling vegetables instead of steaming or sauteing them.  Add frozen vegetables to soups, stews, pasta, or rice.  Add nuts or seeds for added healthy fat at each meal. You can add these to yogurt, salads, or vegetable dishes.  Marinate fish or vegetables using olive oil, lemon juice, garlic, and fresh herbs. Meal planning  Plan to eat 1 vegetarian meal one day each week. Try to work up to 2 vegetarian meals, if possible.  Eat seafood 2 or more times a week.  Have healthy snacks readily available, such as: ? Vegetable sticks with hummus. ? [place] yogurt. ? Fruit and nut trail mix.  Eat balanced meals throughout the week. This includes: ? Fruit: 2-3 servings a day ? Vegetables: 4-5 servings a day ? Low-fat dairy: 2 servings a day ? Fish, poultry, or lean meat: 1 serving a day ? Beans and legumes: 2 or more servings  a week ? Nuts and seeds: 1-2 servings a day ? Whole grains: 6-8 servings a day ? Extra-virgin olive oil: 3-4 servings a day  Limit red meat and sweets to only a few servings a month What are my food choices?  Mediterranean diet ? Recommended ? Grains: Whole-grain pasta. Brown rice. Bulgar wheat. Polenta. Couscous. Whole-wheat bread. [place]. ? Vegetables: Artichokes. Beets. Broccoli. Cabbage. Carrots. Eggplant. Green beans. Chard. Kale. Spinach. Onions. Leeks. Peas. Squash. Tomatoes. Peppers. Radishes. ? Fruits: Apples. Apricots. Avocado. Berries. Bananas. Cherries. Dates. Figs. Grapes. Lemons. Melon. Oranges. Peaches. Plums. Pomegranate. ? Meats and other protein foods: Beans. Almonds. Sunflower seeds. Pine nuts. Peanuts. [place]. Salmon. Scallops. Shrimp. [place]. Tilapia. Clams. Oysters. Eggs. ? Dairy: Low-fat milk. Cheese. Greek yogurt. ? Beverages: Water. Red [place] Raspberry. Herbal tea. ? Fats and oils: Extra virgin olive oil. Avocado oil. Grape seed oil. ? Sweets and desserts: [place] yogurt with honey. Baked apples. Poached pears. Trail mix. ? Seasoning and other foods: Basil. Cilantro. Coriander. Cumin. Mint. Parsley. Sage. Rosemary. Tarragon. Garlic. Oregano. Thyme. Pepper. Balsalmic vinegar. Tahini. Hummus. Tomato sauce. Olives. Mushrooms. ? Limit these ? Grains: Prepackaged pasta or rice dishes. Prepackaged cereal with added sugar. ? Vegetables: Deep fried potatoes (french fries). ? Fruits: Fruit canned in syrup. ? Meats and other protein foods: Beef. Pork. Lamb. Poultry with skin. Hot dogs. Bernie[place] Salines. ? Dairy: Ice cream. Sour cream. Whole milk. ? Beverages: Juice. Sugar-sweetened soft drinks. Beer. Liquor and spirits. ? Fats and oils: Butter. Canola oil. Vegetable oil. Beef fat (tallow). Lard. ? Sweets and desserts: Cookies. Cakes. Pies. Candy. ? Seasoning and other foods: Mayonnaise. Premade sauces and marinades. ? The items listed may not be a complete list. Talk with your dietitian about  what dietary choices are right for you. Summary  The Mediterranean diet includes both food and lifestyle choices.  Eat a variety of fresh fruits and vegetables, beans, nuts, seeds,  and whole grains.  Limit the amount of red meat and sweets that you eat.  Talk with your health care provider about whether it is safe for you to drink red Maram Bently in moderation. This means 1 glass a day for nonpregnant women and 2 glasses a day for men. A glass of Emmery Seiler equals 5 oz (150 mL). This information is not intended to replace advice given to you by your health care provider. Make sure you discuss any questions you have with your health care provider. Document Released: 12/16/2015 Document Revised: 01/18/2016 Document Reviewed: 12/16/2015 Elsevier Interactive Patient Education  [employer].

## 2017-08-13 NOTE — Telephone Encounter (Addendum)
During AWV, patient stated he would like to see about taking a different medication other than amlodipine. He read on the intranet that common side effects from this medication are urinating more frequently and having night sweats, both of which he states he experiences.

## 2017-08-14 NOTE — Telephone Encounter (Signed)
Called to inform patient that PCP would like for him to continue the same treatment of taking amlodipine. Explaining that PCP feels like the side effects that the patient mention are rare and that the intranet will list everything that any one has complained about. Patient verbalized understanding.

## 2017-08-15 ENCOUNTER — Encounter: Payer: Self-pay | Admitting: Physician Assistant

## 2017-08-17 ENCOUNTER — Ambulatory Visit (INDEPENDENT_AMBULATORY_CARE_PROVIDER_SITE_OTHER): Payer: Medicare Other | Admitting: Podiatry

## 2017-08-17 ENCOUNTER — Encounter: Payer: Self-pay | Admitting: Podiatry

## 2017-08-17 DIAGNOSIS — B351 Tinea unguium: Secondary | ICD-10-CM

## 2017-08-17 DIAGNOSIS — M79675 Pain in left toe(s): Secondary | ICD-10-CM

## 2017-08-17 DIAGNOSIS — Q828 Other specified congenital malformations of skin: Secondary | ICD-10-CM

## 2017-08-17 DIAGNOSIS — M79674 Pain in right toe(s): Secondary | ICD-10-CM | POA: Diagnosis not present

## 2017-08-17 NOTE — Progress Notes (Signed)
Subjective:    Patient ID: [name] [name], male    DOB: 04/26/1951, 67 y.o.   MRN: [id number]  HPI 67 year old male presents the office today for concerns of a callus in the right foot submetatarsal 2 as well as the bottom of the big toe.  This is been ongoing for some time.  He states that it hurts with pressure and walking at times.  Denies any drainage or pus or any recent injury.  No swelling or redness.  There is also thick and elongated he cannot trim them himself.  No other concerns today.   Review of Systems  All other systems reviewed and are negative.  Past Medical History:  Diagnosis Date  . BENIGN PROSTATIC HYPERTROPHY 05/06/2010  . COMMON MIGRAINE 07/31/2007  . GERD 07/31/2007  . HYPERLIPIDEMIA 01/04/2007  . HYPERTENSION 01/04/2007  . OVERACTIVE BLADDER 03/06/2007  . Prostate cancer ([place]) 02/04/15    History reviewed. No pertinent surgical history.   Current Outpatient Medications:  .  amLODipine (NORVASC) 10 MG tablet, Take 1 tablet (10 mg total) by mouth daily., Disp: 90 tablet, Rfl: 1 .  aspirin 81 MG tablet, Take 81 mg by mouth daily. , Disp: , Rfl:  .  atorvastatin (LIPITOR) 10 MG tablet, Take 1 tablet (10 mg total) by mouth daily., Disp: 90 tablet, Rfl: 1 .  benazepril (LOTENSIN) 10 MG tablet, Take 1 tablet (10 mg total) by mouth daily., Disp: 90 tablet, Rfl: 1 .  mirabegron ER (MYRBETRIQ) 25 MG TB24 tablet, Take 1 tablet (25 mg total) by mouth daily., Disp: 90 tablet, Rfl: 3 .  polyethylene glycol (MIRALAX / GLYCOLAX) packet, Take 17 g by mouth as needed. , Disp: , Rfl:  .  sildenafil (REVATIO) 20 MG tablet, Take 3 tabs by mouth every day as needed, Disp: 30 tablet, Rfl: 11 .  Tamsulosin HCl (FLOMAX) 0.4 MG CAPS, Take 1 capsule (0.4 mg total) by mouth daily., Disp: 30 capsule, Rfl: 11  Allergies  Allergen Reactions  . Celecoxib Other (See Comments)    Stroke like symptoms  . Crestor [Rosuvastatin]     weakness  . Detrol [Tolterodine] Other (See Comments)   headache  . Ditropan [Oxybutynin] Other (See Comments)    Dry mouth    Social History   Socioeconomic History  . Marital status: Married    Spouse name: Not on file  . Number of children: 3  . Years of education: Not on file  . Highest education level: Not on file  Occupational History  . Not on file  Social Needs  . Financial resource strain: Not hard at all  . Food insecurity:    Worry: Never true    Inability: Never true  . Transportation needs:    Medical: No    Non-medical: No  Tobacco Use  . Smoking status: Never Smoker  . Smokeless tobacco: Never Used  Substance and Sexual Activity  . Alcohol use: No    Alcohol/week: 0.0 oz  . Drug use: No  . Sexual activity: Yes  Lifestyle  . Physical activity:    Days per week: 0 days    Minutes per session: 0 min  . Stress: Not at all  Relationships  . Social connections:    Talks on phone: More than three times a week    Gets together: More than three times a week    Attends religious service: More than 4 times per year    Active member of club or organization: Yes  Attends meetings of clubs or organizations: More than 4 times per year    Relationship status: Married  . Intimate partner violence:    Fear of current or ex partner: No    Emotionally abused: No    Physically abused: No    Forced sexual activity: No  Other Topics Concern  . Not on file  Social History Narrative  . Not on file        Objective:   Physical Exam General: AAO x3, NAD  Dermatological: Nails are hypertrophic, dystrophic, brittle, discolored, elongated 10. No surrounding redness or drainage. Tenderness nails 1-5 bilaterally.  Hyperkeratotic lesions on the right foot submetatarsal and plantar hallux.  Upon debridement there is no underlying ulceration, drainage or any clinical signs of infection noted today.  No open lesions or pre-ulcerative lesions are identified today.  Vascular: Dorsalis Pedis artery and Posterior Tibial artery  pedal pulses are 2/4 bilateral with immedate capillary fill time. There is no pain with calf compression, swelling, warmth, erythema.   Neruologic: Grossly intact via light touch bilateral.Protective threshold with Semmes Wienstein monofilament intact to all pedal sites bilateral.   Musculoskeletal: No gross boney pedal deformities bilateral. No pain, crepitus, or limitation noted with foot and ankle range of motion bilateral. Muscular strength 5/5 in all groups tested bilateral.  Gait: Unassisted, Nonantalgic.      Assessment & Plan:  67 year old male with symptomatic porokeratosis, onychomycosis -Treatment options discussed including all alternatives, risks, and complications -Etiology of symptoms were discussed -Debrided the hyperkeratotic lesions x2 without any complications or bleeding.  Discussed offloading to the area as well as moisturizing the area daily.  Discussed likely recurrence. -Nails debrided x10 without any complications or bleeding.  Trula Slade DPM

## 2017-08-21 ENCOUNTER — Telehealth: Payer: Self-pay

## 2017-08-21 NOTE — Telephone Encounter (Signed)
Denied.  Medication request can not be covered under medicare part D.  Determination sent to scan.

## 2017-09-19 ENCOUNTER — Encounter: Payer: Self-pay | Admitting: Internal Medicine

## 2017-09-19 ENCOUNTER — Ambulatory Visit (INDEPENDENT_AMBULATORY_CARE_PROVIDER_SITE_OTHER): Payer: Medicare Other | Admitting: Internal Medicine

## 2017-09-19 VITALS — BP 124/80 | HR 67 | Temp 97.9°F | Ht 67.0 in | Wt 209.0 lb

## 2017-09-19 DIAGNOSIS — R350 Frequency of micturition: Secondary | ICD-10-CM | POA: Diagnosis not present

## 2017-09-19 DIAGNOSIS — E785 Hyperlipidemia, unspecified: Secondary | ICD-10-CM | POA: Diagnosis not present

## 2017-09-19 DIAGNOSIS — I10 Essential (primary) hypertension: Secondary | ICD-10-CM | POA: Diagnosis not present

## 2017-09-19 DIAGNOSIS — Z1211 Encounter for screening for malignant neoplasm of colon: Secondary | ICD-10-CM | POA: Diagnosis not present

## 2017-09-19 DIAGNOSIS — R3129 Other microscopic hematuria: Secondary | ICD-10-CM

## 2017-09-19 MED ORDER — FESOTERODINE FUMARATE ER 4 MG PO TB24: 4.0000 mg | ORAL_TABLET | Freq: Every day | ORAL | 3 refills | Status: DC

## 2017-09-19 NOTE — Progress Notes (Signed)
Subjective:    Patient ID: [name] [name], male    DOB: 12-03-1950, 67 y.o.   MRN: [id number]  HPI  Here to f/u; overall doing ok,  Pt denies chest pain, increasing sob or doe, wheezing, orthopnea, PND, increased LE swelling, palpitations, dizziness or syncope.  Pt denies new neurological symptoms such as new headache, or facial or extremity weakness or numbness.  Pt denies polydipsia, polyuria, or low sugar episode.  Pt states overall good compliance with meds, mostly trying to follow appropriate diet, with wt overall stable,   Still with urinary frequency and urgency and occas leakage., mybetrix not really working.   Has seen urology dec 2018, with plan to f/u at 6 mo with psa.  Denies urinary symptoms such as dysuria, flank pain, hematuria or n/v, fever, chills.  Has hx of persistent microhematuria with neg CT 2014, and he believes he had cysto as well  Due next mo for colonoscopy as well Past Medical History:  Diagnosis Date  . BENIGN PROSTATIC HYPERTROPHY 05/06/2010  . COMMON MIGRAINE 07/31/2007  . GERD 07/31/2007  . HYPERLIPIDEMIA 01/04/2007  . HYPERTENSION 01/04/2007  . OVERACTIVE BLADDER 03/06/2007  . Prostate cancer ([place]) 02/04/15   No past surgical history on file.  reports that he has never smoked. He has never used smokeless tobacco. He reports that he does not drink alcohol or use drugs. family history includes Lung cancer (age of onset: 56) in his father. Allergies  Allergen Reactions  . Celecoxib Other (See Comments)    Stroke like symptoms  . Crestor [Rosuvastatin]     weakness  . Detrol [Tolterodine] Other (See Comments)    headache  . Ditropan [Oxybutynin] Other (See Comments)    Dry mouth   Current Outpatient Medications on File Prior to Visit  Medication Sig Dispense Refill  . amLODipine (NORVASC) 10 MG tablet Take 1 tablet (10 mg total) by mouth daily. 90 tablet 1  . aspirin 81 MG tablet Take 81 mg by mouth daily.     Marland Kitchen atorvastatin (LIPITOR) 10 MG tablet Take 1  tablet (10 mg total) by mouth daily. 90 tablet 1  . benazepril (LOTENSIN) 10 MG tablet Take 1 tablet (10 mg total) by mouth daily. 90 tablet 1  . polyethylene glycol (MIRALAX / GLYCOLAX) packet Take 17 g by mouth as needed.     . sildenafil (REVATIO) 20 MG tablet Take 3 tabs by mouth every day as needed 30 tablet 11  . Tamsulosin HCl (FLOMAX) 0.4 MG CAPS Take 1 capsule (0.4 mg total) by mouth daily. 30 capsule 11   No current facility-administered medications on file prior to visit.    Review of Systems  Constitutional: Negative for other unusual diaphoresis or sweats HENT: Negative for ear discharge or swelling Eyes: Negative for other worsening visual disturbances Respiratory: Negative for stridor or other swelling  Gastrointestinal: Negative for worsening distension or other blood Genitourinary: Negative for retention or other urinary change Musculoskeletal: Negative for other MSK pain or swelling Skin: Negative for color change or other new lesions Neurological: Negative for worsening tremors and other numbness  Psychiatric/Behavioral: Negative for worsening agitation or other fatigue All other system neg per pt    Objective:   Physical Exam BP 124/80   Pulse 67   Temp 97.9 F (36.6 C) (Oral)   Ht 5\' 7"  (1.702 m)   Wt 209 lb (94.8 kg)   SpO2 97%   BMI 32.73 kg/m  VS noted,  Constitutional: Pt appears in NAD  HENT: Head: NCAT.  Right Ear: External ear normal.  Left Ear: External ear normal.  Eyes: . Pupils are equal, round, and reactive to light. Conjunctivae and EOM are normal Nose: without d/c or deformity Neck: Neck supple. Gross normal ROM Cardiovascular: Normal rate and regular rhythm.   Pulmonary/Chest: Effort normal and breath sounds without rales or wheezing.  Abd:  Soft, NT, ND, + BS, no organomegaly Neurological: Pt is alert. At baseline orientation, motor grossly intact Skin: Skin is warm. No rashes, other new lesions, no LE edema Psychiatric: Pt behavior is  normal without agitation  No other exam findings Lab Results  Component Value Date   WBC 7.5 03/22/2017   HGB 14.7 03/22/2017   HCT 43.5 03/22/2017   PLT 288.0 03/22/2017   GLUCOSE 93 03/22/2017   CHOL 185 03/22/2017   TRIG 83.0 03/22/2017   HDL 65.90 03/22/2017   LDLDIRECT 139.2 10/29/2012   LDLCALC 103 (H) 03/22/2017   ALT 22 03/22/2017   AST 22 03/22/2017   NA 138 03/22/2017   K 3.7 03/22/2017   CL 100 03/22/2017   CREATININE 1.18 03/22/2017   BUN 15 03/22/2017   CO2 30 03/22/2017   TSH 1.37 03/22/2017   PSA 0.60 03/22/2017   INR 1.0 06/24/2007   HGBA1C  06/24/2007    5.9 (NOTE)   The ADA recommends the following therapeutic goals for glycemic   control related to Hgb A1C measurement:   Goal of Therapy:   < 7.0% Hgb A1C   Action Suggested:  > 8.0% Hgb A1C   Ref:  Diabetes Care, 22, Suppl. 1, 1999       Assessment & Plan:

## 2017-09-19 NOTE — Assessment & Plan Note (Signed)
stable overall by history and exam, recent data reviewed with pt, and pt to continue medical treatment as before,  to f/u any worsening symptoms or concerns Lab Results  Component Value Date   LDLCALC 103 (H) 03/22/2017

## 2017-09-19 NOTE — Assessment & Plan Note (Signed)
To cont flomax, change myrbetrix to toviaz 4 qd, f/u with urology as planned with PVR and  PSA

## 2017-09-19 NOTE — Assessment & Plan Note (Signed)
stable overall by history and exam, recent data reviewed with pt, and pt to continue medical treatment as before,  to f/u any worsening symptoms or concerns BP Readings from Last 3 Encounters:  09/19/17 124/80  08/13/17 (!) 158/86  04/19/17 132/84

## 2017-09-19 NOTE — Assessment & Plan Note (Signed)
Chronic recurrent, neg evaluation 2014,  to f/u any worsening symptoms or concerns

## 2017-09-19 NOTE — Patient Instructions (Addendum)
You will be contacted regarding the referral for: colonoscopy  Ok to change the myrbetrix to Toviaz 4 mg daily  Please remember to follow up with urology with PSA as you have planned  Please continue all other medications as before, and refills have been done if requested.  Please have the pharmacy call with any other refills you may need.  Please continue your efforts at being more active, low cholesterol diet, and weight control.  You are otherwise up to date with prevention measures today.  Please keep your appointments with your specialists as you may have planned  We can hold off on more lab work today  Please return in 6 months, or sooner if needed

## 2017-09-24 ENCOUNTER — Encounter: Payer: Self-pay | Admitting: Internal Medicine

## 2017-10-02 ENCOUNTER — Ambulatory Visit (INDEPENDENT_AMBULATORY_CARE_PROVIDER_SITE_OTHER): Payer: Medicare Other | Admitting: Physician Assistant

## 2017-10-02 ENCOUNTER — Encounter: Payer: Self-pay | Admitting: Physician Assistant

## 2017-10-02 VITALS — BP 122/72 | HR 62 | Temp 98.2°F | Resp 17 | Ht 67.0 in | Wt 205.0 lb

## 2017-10-02 DIAGNOSIS — M7989 Other specified soft tissue disorders: Secondary | ICD-10-CM | POA: Diagnosis not present

## 2017-10-02 LAB — POCT URINALYSIS DIP (MANUAL ENTRY)
Bilirubin, UA: NEGATIVE
Glucose, UA: NEGATIVE mg/dL
Leukocytes, UA: NEGATIVE
Nitrite, UA: NEGATIVE
Protein Ur, POC: 30 mg/dL — AB
Spec Grav, UA: 1.02 (ref 1.010–1.025)
Urobilinogen, UA: 0.2 U/dL
pH, UA: 6.5 (ref 5.0–8.0)

## 2017-10-02 NOTE — Patient Instructions (Addendum)
Stop taking Norvasc (Amlodipine)  Start taking Benazepril 61m. You will take two pills of your 121m (we will increase your dose, rather than add on another medication at this point).  Continue checking your home blood pressures. Write these down and bring them with you to your next appointment with me.  Elevate your feet Wear compression socks.  Come back and see me in 3 weeks. Come back sooner if you are concerned about symptoms.   Thank you for coming in today. I hope you feel we met your needs.  Feel free to call PCP if you have any questions or further requests.    Best,  Whitney McVey, PA-C   DASH Eating Plan DASH stands for "Dietary Approaches to Stop Hypertension." The DASH eating plan is a healthy eating plan that has been shown to reduce high blood pressure (hypertension). It may also reduce your risk for type 2 diabetes, heart disease, and stroke. The DASH eating plan may also help with weight loss. What are tips for following this plan? General guidelines  Avoid eating more than 2,300 mg (milligrams) of salt (sodium) a day. If you have hypertension, you may need to reduce your sodium intake to 1,500 mg a day.  Limit alcohol intake to no more than 1 drink a day for nonpregnant women and 2 drinks a day for men. One drink equals 12 oz of beer, 5 oz of wine, or 1 oz of hard liquor.  Work with your health care provider to maintain a healthy body weight or to lose weight. Ask what an ideal weight is for you.  Get at least 30 minutes of exercise that causes your heart to beat faster (aerobic exercise) most days of the week. Activities may include walking, swimming, or biking.  Work with your health care provider or diet and nutrition specialist (dietitian) to adjust your eating plan to your individual calorie needs. Reading food labels  Check food labels for the amount of sodium per serving. Choose foods with less than 5 percent of the Daily Value of sodium. Generally, foods  with less than 300 mg of sodium per serving fit into this eating plan.  To find whole grains, look for the word "whole" as the first word in the ingredient list. Shopping  Buy products labeled as "low-sodium" or "no salt added."  Buy fresh foods. Avoid canned foods and premade or frozen meals. Cooking  Avoid adding salt when cooking. Use salt-free seasonings or herbs instead of table salt or sea salt. Check with your health care provider or pharmacist before using salt substitutes.  Do not fry foods. Cook foods using healthy methods such as baking, boiling, grilling, and broiling instead.  Cook with heart-healthy oils, such as olive, canola, soybean, or sunflower oil. Meal planning   Eat a balanced diet that includes: ? 5 or more servings of fruits and vegetables each day. At each meal, try to fill half of your plate with fruits and vegetables. ? Up to 6-8 servings of whole grains each day. ? Less than 6 oz of lean meat, poultry, or fish each day. A 3-oz serving of meat is about the same size as a deck of cards. One egg equals 1 oz. ? 2 servings of low-fat dairy each day. ? A serving of nuts, seeds, or beans 5 times each week. ? Heart-healthy fats. Healthy fats called Omega-3 fatty acids are found in foods such as flaxseeds and coldwater fish, like sardines, salmon, and mackerel.  Limit how much you  eat of the following: ? Canned or prepackaged foods. ? Food that is high in trans fat, such as fried foods. ? Food that is high in saturated fat, such as fatty meat. ? Sweets, desserts, sugary drinks, and other foods with added sugar. ? Full-fat dairy products.  Do not salt foods before eating.  Try to eat at least 2 vegetarian meals each week.  Eat more home-cooked food and less restaurant, buffet, and fast food.  When eating at a restaurant, ask that your food be prepared with less salt or no salt, if possible. What foods are recommended? The items listed may not be a complete  list. Talk with your dietitian about what dietary choices are best for you. Grains Whole-grain or whole-wheat bread. Whole-grain or whole-wheat pasta. Brown rice. [place]ena Morrow. Bulgur. Whole-grain and low-sodium cereals. Pita bread. Low-fat, low-sodium crackers. Whole-wheat flour tortillas. Vegetables Fresh or frozen vegetables (raw, steamed, roasted, or grilled). Low-sodium or reduced-sodium tomato and vegetable juice. Low-sodium or reduced-sodium tomato sauce and tomato paste. Low-sodium or reduced-sodium canned vegetables. Fruits All fresh, dried, or frozen fruit. Canned fruit in natural juice (without added sugar). Meat and other protein foods Skinless chicken or [place]. Ground chicken or [place]. Pork with fat trimmed off. Fish and seafood. Egg whites. Dried beans, peas, or lentils. Unsalted nuts, nut butters, and seeds. Unsalted canned beans. Lean cuts of beef with fat trimmed off. Low-sodium, lean deli meat. Dairy Low-fat (1%) or fat-free (skim) milk. Fat-free, low-fat, or reduced-fat cheeses. Nonfat, low-sodium ricotta or cottage cheese. Low-fat or nonfat yogurt. Low-fat, low-sodium cheese. Fats and oils Soft margarine without trans fats. Vegetable oil. Low-fat, reduced-fat, or light mayonnaise and salad dressings (reduced-sodium). Canola, safflower, olive, soybean, and sunflower oils. Avocado. Seasoning and other foods Herbs. Spices. Seasoning mixes without salt. Unsalted popcorn and pretzels. Fat-free sweets. What foods are not recommended? The items listed may not be a complete list. Talk with your dietitian about what dietary choices are best for you. Grains Baked goods made with fat, such as croissants, muffins, or some breads. Dry pasta or rice meal packs. Vegetables Creamed or fried vegetables. Vegetables in a cheese sauce. Regular canned vegetables (not low-sodium or reduced-sodium). Regular canned tomato sauce and paste (not low-sodium or reduced-sodium). Regular tomato and  vegetable juice (not low-sodium or reduced-sodium). Angie Fava. Olives. Fruits Canned fruit in a light or heavy syrup. Fried fruit. Fruit in cream or butter sauce. Meat and other protein foods Fatty cuts of meat. Ribs. Fried meat. Berniece Salines. Sausage. Bologna and other processed lunch meats. Salami. Fatback. Hotdogs. Bratwurst. Salted nuts and seeds. Canned beans with added salt. Canned or smoked fish. Whole eggs or egg yolks. Chicken or [place] with skin. Dairy Whole or 2% milk, cream, and half-and-half. Whole or full-fat cream cheese. Whole-fat or sweetened yogurt. Full-fat cheese. Nondairy creamers. Whipped toppings. Processed cheese and cheese spreads. Fats and oils Butter. Stick margarine. Lard. Shortening. Ghee. Bacon fat. Tropical oils, such as coconut, palm kernel, or palm oil. Seasoning and other foods Salted popcorn and pretzels. Onion salt, garlic salt, seasoned salt, table salt, and sea salt. Worcestershire sauce. Tartar sauce. Barbecue sauce. Teriyaki sauce. Soy sauce, including reduced-sodium. Steak sauce. Canned and packaged gravies. Fish sauce. Oyster sauce. Cocktail sauce. Horseradish that you find on the shelf. Ketchup. Mustard. Meat flavorings and tenderizers. Bouillon cubes. Hot sauce and Tabasco sauce. Premade or packaged marinades. Premade or packaged taco seasonings. Relishes. Regular salad dressings. Where to find more information:  National Heart, Lung, and Blood Institute: https://wilson-eaton.com/  American  Heart Association: www.heart.org Summary  The DASH eating plan is a healthy eating plan that has been shown to reduce high blood pressure (hypertension). It may also reduce your risk for type 2 diabetes, heart disease, and stroke.  With the DASH eating plan, you should limit salt (sodium) intake to 2,300 mg a day. If you have hypertension, you may need to reduce your sodium intake to 1,500 mg a day.  When on the DASH eating plan, aim to eat more fresh fruits and vegetables, whole  grains, lean proteins, low-fat dairy, and heart-healthy fats.  Work with your health care provider or diet and nutrition specialist (dietitian) to adjust your eating plan to your individual calorie needs. This information is not intended to replace advice given to you by your health care provider. Make sure you discuss any questions you have with your health care provider. Document Released: 04/13/2011 Document Revised: 04/17/2016 Document Reviewed: 04/17/2016 Elsevier Interactive Patient Education  2018 [employer].  IF you received an x-ray today, you will receive an invoice from [facility]. Please contact [facility] at [phone] with questions or concerns regarding your invoice.   IF you received labwork today, you will receive an invoice from [place]. Please contact [facility] at [phone] with questions or concerns regarding your invoice.   Our billing staff will not be able to assist you with questions regarding bills from these companies.  You will be contacted with the lab results as soon as they are available. The fastest way to get your results is to activate your My Chart account. Instructions are located on the last page of this paperwork. If you have not heard from [place] regarding the results in 2 weeks, please contact this office.

## 2017-10-02 NOTE — Progress Notes (Signed)
[name] [name]  MRN: [id number] DOB: [birth year]  PCP: Biagio Borg, MD  Subjective:  Pt is a pleasant 67 year old male who presents to clinic for b/l feet swelling x 5 days.   HTN - norvasc 68m x 5 years and benazepril 162mqd. BP is controlled. Today's blood pressure is 122/72. [facility] - last OV with Dr. JoJenny Reichmannas earlier this month. Nex f/u is in 6 months. He has never been evaluated by cardiology.    Diet - not too healthy. Has been cutting back on fried foods.  Exercise - does not exercise.  Brother died from MI at age 69 Never smoker.  No alcohol.   Celecoxib allergy - stroke-like symptoms.   Denies shob, chest pain, wheezing, leg pain, DOE, PND, calf pain with walking, urinary symptoms, back pain, abdominal pain, urinary symptoms.    Pt  has a past medical history of BENIGN PROSTATIC HYPERTROPHY (05/06/2010), COMMON MIGRAINE (07/31/2007), GERD (07/31/2007), HYPERLIPIDEMIA (01/04/2007), HYPERTENSION (01/04/2007), OVERACTIVE BLADDER (03/06/2007), and Prostate cancer ([place](02/04/15).  Review of Systems  Constitutional: Negative for chills and diaphoresis.  Respiratory: Negative for cough, chest tightness, shortness of breath and wheezing.   Cardiovascular: Positive for leg swelling (b/l ). Negative for chest pain and palpitations.  Gastrointestinal: Negative for diarrhea, nausea and vomiting.  Musculoskeletal: Negative for neck pain.  Neurological: Negative for dizziness, syncope, light-headedness and headaches.  Psychiatric/Behavioral: Negative for sleep disturbance. The patient is not nervous/anxious.     Patient Active Problem List   Diagnosis Date Noted  . Low back pain 04/19/2017  . Lightheadedness 10/26/2016  . Eustachian tube dysfunction, right 08/17/2016  . Allergic rhinitis 08/17/2016  . Erectile dysfunction 08/17/2016  . Diastolic dysfunction 0108/14/4818. Syncope 04/04/2016  . Weakness 03/26/2015  . Malignant neoplasm  of prostate ([place]/18/2016  . Chest pain 10/01/2013  . Cough 08/04/2013  . Asthmatic bronchitis 07/15/2013  . Microhematuria 05/09/2012  . Constipation 08/13/2011  . Ventral hernia 08/13/2011  . Umbilical hernia 0456/31/4970. Preventative health care 04/25/2011  . Nocturia 06/29/2010  . BENIGN PROSTATIC HYPERTROPHY 05/06/2010  . BACK PAIN 05/06/2010  . SKIN LESION 11/30/2008  . FREQUENCY, URINARY 11/30/2008  . COMMON MIGRAINE 07/31/2007  . GERD 07/31/2007  . OVERACTIVE BLADDER 03/06/2007  . Hyperlipidemia 01/04/2007  . Essential hypertension 01/04/2007    Current Outpatient Medications on File Prior to Visit  Medication Sig Dispense Refill  . amLODipine (NORVASC) 10 MG tablet Take 1 tablet (10 mg total) by mouth daily. 90 tablet 1  . aspirin 81 MG tablet Take 81 mg by mouth daily.     . Marland Kitchentorvastatin (LIPITOR) 10 MG tablet Take 1 tablet (10 mg total) by mouth daily. 90 tablet 1  . benazepril (LOTENSIN) 10 MG tablet Take 1 tablet (10 mg total) by mouth daily. 90 tablet 1  . fesoterodine (TOVIAZ) 4 MG TB24 tablet Take 1 tablet (4 mg total) by mouth daily. 90 tablet 3  . polyethylene glycol (MIRALAX / GLYCOLAX) packet Take 17 g by mouth as needed.     . sildenafil (REVATIO) 20 MG tablet Take 3 tabs by mouth every day as needed 30 tablet 11  . Tamsulosin HCl (FLOMAX) 0.4 MG CAPS Take 1 capsule (0.4 mg total) by mouth daily. 30 capsule 11   No current facility-administered medications on file prior to visit.     Allergies  Allergen Reactions  . Celecoxib Other (See Comments)    Stroke  like symptoms  . Crestor [Rosuvastatin]     weakness  . Detrol [Tolterodine] Other (See Comments)    headache  . Ditropan [Oxybutynin] Other (See Comments)    Dry mouth     Objective:  BP 122/72   Pulse 62   Temp 98.2 F (36.8 C) (Oral)   Resp 17   Ht 5' 7"  (1.702 m)   Wt 205 lb (93 kg)   SpO2 98%   BMI 32.11 kg/m   Physical Exam  Constitutional: He is oriented to person, place,  and time. He appears well-developed and well-nourished.  Neck: No JVD present. Carotid bruit is not present.  Cardiovascular: Normal rate, regular rhythm, normal heart sounds and intact distal pulses. Exam reveals no gallop.  No murmur heard. Pulmonary/Chest: Effort normal. No respiratory distress.  Musculoskeletal:       Right ankle: He exhibits swelling (1+).       Left ankle: He exhibits swelling (1+).       Right lower leg: He exhibits no edema.       Left lower leg: He exhibits no edema.  Neurological: He is alert and oriented to person, place, and time.  Skin: Skin is warm, dry and intact.  Psychiatric: He has a normal mood and affect. His behavior is normal. Judgment and thought content normal.  Vitals reviewed.   Lab Results  Component Value Date   CHOL 185 03/22/2017   CHOL 191 04/04/2016   CHOL 179 03/26/2015   Lab Results  Component Value Date   HDL 65.90 03/22/2017   HDL 67.00 04/04/2016   HDL 65.50 03/26/2015   Lab Results  Component Value Date   LDLCALC 103 (H) 03/22/2017   LDLCALC 100 (H) 04/04/2016   LDLCALC 102 (H) 03/26/2015   Lab Results  Component Value Date   TRIG 83.0 03/22/2017   TRIG 121.0 04/04/2016   TRIG 59.0 03/26/2015   Lab Results  Component Value Date   CHOLHDL 3 03/22/2017   CHOLHDL 3 04/04/2016   CHOLHDL 3 03/26/2015   Lab Results  Component Value Date   LDLDIRECT 139.2 10/29/2012   LDLDIRECT 138.5 05/07/2012   LDLDIRECT 164.8 05/03/2010    Assessment and Plan :  1. Swelling of both lower extremities - CMP14+EGFR - POCT urinalysis dipstick -Patient is a 67 year old male who presents with bilateral feet swelling x5 days.  He has no other symptoms. Low suspicion for cardiac etiology at this time.  Suspect possible side effect from Norvasc.  Plan to stop Norvasc and increase benazepril.  Return to clinic in 3 weeks for blood pressure and edema check. If no improvement, refer to cardiology due to family history.   Mercer Pod,  PA-C  [facility] [place] 10/02/2017 1:52 PM

## 2017-10-03 LAB — CMP14+EGFR
ALT: 23 IU/L (ref 0–44)
AST: 23 IU/L (ref 0–40)
Albumin/Globulin Ratio: 1.5 (ref 1.2–2.2)
Albumin: 4.8 g/dL (ref 3.6–4.8)
Alkaline Phosphatase: 77 IU/L (ref 39–117)
BUN/Creatinine Ratio: 13 (ref 10–24)
BUN: 15 mg/dL (ref 8–27)
Bilirubin Total: 0.7 mg/dL (ref 0.0–1.2)
CO2: 23 mmol/L (ref 20–29)
Calcium: 10.2 mg/dL (ref 8.6–10.2)
Chloride: 103 mmol/L (ref 96–106)
Creatinine, Ser: 1.13 mg/dL (ref 0.76–1.27)
GFR calc Af Amer: 76 mL/min/{1.73_m2} (ref >59)
GFR calc non Af Amer: 66 mL/min/{1.73_m2} (ref >59)
Globulin, Total: 3.1 g/dL (ref 1.5–4.5)
Glucose: 80 mg/dL (ref 65–99)
Potassium: 4.2 mmol/L (ref 3.5–5.2)
Sodium: 144 mmol/L (ref 134–144)
Total Protein: 7.9 g/dL (ref 6.0–8.5)

## 2017-10-05 DIAGNOSIS — Z9841 Cataract extraction status, right eye: Secondary | ICD-10-CM | POA: Diagnosis not present

## 2017-10-05 DIAGNOSIS — H11823 Conjunctivochalasis, bilateral: Secondary | ICD-10-CM | POA: Diagnosis not present

## 2017-10-05 DIAGNOSIS — H0100B Unspecified blepharitis left eye, upper and lower eyelids: Secondary | ICD-10-CM | POA: Diagnosis not present

## 2017-10-05 DIAGNOSIS — H11153 Pinguecula, bilateral: Secondary | ICD-10-CM | POA: Diagnosis not present

## 2017-10-05 DIAGNOSIS — H0100A Unspecified blepharitis right eye, upper and lower eyelids: Secondary | ICD-10-CM | POA: Diagnosis not present

## 2017-10-05 DIAGNOSIS — Z961 Presence of intraocular lens: Secondary | ICD-10-CM | POA: Diagnosis not present

## 2017-10-05 DIAGNOSIS — H35373 Puckering of macula, bilateral: Secondary | ICD-10-CM | POA: Diagnosis not present

## 2017-10-05 DIAGNOSIS — H04123 Dry eye syndrome of bilateral lacrimal glands: Secondary | ICD-10-CM | POA: Diagnosis not present

## 2017-10-05 DIAGNOSIS — H18413 Arcus senilis, bilateral: Secondary | ICD-10-CM | POA: Diagnosis not present

## 2017-10-05 DIAGNOSIS — H47233 Glaucomatous optic atrophy, bilateral: Secondary | ICD-10-CM | POA: Diagnosis not present

## 2017-10-05 DIAGNOSIS — H11423 Conjunctival edema, bilateral: Secondary | ICD-10-CM | POA: Diagnosis not present

## 2017-10-05 DIAGNOSIS — H401131 Primary open-angle glaucoma, bilateral, mild stage: Secondary | ICD-10-CM | POA: Diagnosis not present

## 2017-10-11 DIAGNOSIS — C61 Malignant neoplasm of prostate: Secondary | ICD-10-CM | POA: Diagnosis not present

## 2017-10-15 ENCOUNTER — Encounter: Payer: Self-pay | Admitting: Internal Medicine

## 2017-10-18 DIAGNOSIS — R3915 Urgency of urination: Secondary | ICD-10-CM | POA: Diagnosis not present

## 2017-10-18 DIAGNOSIS — C61 Malignant neoplasm of prostate: Secondary | ICD-10-CM | POA: Diagnosis not present

## 2017-10-18 DIAGNOSIS — N5201 Erectile dysfunction due to arterial insufficiency: Secondary | ICD-10-CM | POA: Diagnosis not present

## 2017-10-18 DIAGNOSIS — R351 Nocturia: Secondary | ICD-10-CM | POA: Diagnosis not present

## 2017-10-18 DIAGNOSIS — N401 Enlarged prostate with lower urinary tract symptoms: Secondary | ICD-10-CM | POA: Diagnosis not present

## 2017-10-23 ENCOUNTER — Encounter: Payer: Self-pay | Admitting: Physician Assistant

## 2017-10-23 ENCOUNTER — Ambulatory Visit (INDEPENDENT_AMBULATORY_CARE_PROVIDER_SITE_OTHER): Payer: Medicare Other | Admitting: Physician Assistant

## 2017-10-23 VITALS — BP 158/88 | HR 66 | Temp 97.9°F | Resp 17 | Ht 67.0 in | Wt 204.0 lb

## 2017-10-23 DIAGNOSIS — I10 Essential (primary) hypertension: Secondary | ICD-10-CM

## 2017-10-23 DIAGNOSIS — R42 Dizziness and giddiness: Secondary | ICD-10-CM

## 2017-10-23 DIAGNOSIS — R030 Elevated blood-pressure reading, without diagnosis of hypertension: Secondary | ICD-10-CM | POA: Diagnosis not present

## 2017-10-23 MED ORDER — BENAZEPRIL HCL 10 MG PO TABS: 15.0000 mg | ORAL_TABLET | Freq: Every day | ORAL | 1 refills | Status: DC

## 2017-10-23 MED ORDER — BENAZEPRIL-HYDROCHLOROTHIAZIDE 5-6.25 MG PO TABS: 1.0000 | ORAL_TABLET | Freq: Every day | ORAL | 3 refills | Status: DC

## 2017-10-23 NOTE — Patient Instructions (Addendum)
  STOP taking Benazepril  START benazepril-hydrochlorthiazide (LOTENSIN HCT) 5-6.25 MG table. Take 2 tablets every morning. Come back and see me in one month.   Thank you for coming in today. I hope you feel we met your needs.  Feel free to call PCP if you have any questions or further requests.  Please consider signing up for MyChart if you do not already have it, as this is a great way to communicate with me.  Best,  Whitney McVey, PA-C  IF you received an x-ray today, you will receive an invoice from [facility]. Please contact [facility] at [phone] with questions or concerns regarding your invoice.   IF you received labwork today, you will receive an invoice from [place]. Please contact [facility] at [phone] with questions or concerns regarding your invoice.   Our billing staff will not be able to assist you with questions regarding bills from these companies.  You will be contacted with the lab results as soon as they are available. The fastest way to get your results is to activate your My Chart account. Instructions are located on the last page of this paperwork. If you have not heard from [place] regarding the results in 2 weeks, please contact this office.

## 2017-10-23 NOTE — Progress Notes (Signed)
[name] [name]  MRN: [id number] DOB: 09-Jun-1950  PCP: Biagio Borg, MD  Subjective:  Pt is a pleasant 67 year old male who presents to clinic for f/u HTN.   He was here 5/28 c/o b/l feet swelling. At that [place], low suspicion for cardiac etiology at this time.  Suspect possible side effect from Norvasc - d/c'd Norvasc and increased benazepril to 20mg  qd.  Today's blood pressure is 155/79 Home blood pressures are around 128/70, sometimes are around 117/80.  C/o dizziness that sometimes lasts several hours. Happens about every other day. He believes this occurs when blood pressures are too high or too low.  He has appt with [facility] Jenny [facility] in about 5 months.  ROS below.   Diet - not too healthy. Has been cutting back on fried foods.  Exercise - does not exercise.  Brother died from MI at age 64.  Never smoker.  No alcohol.   Review of Systems  Constitutional: Negative for chills, diaphoresis, fatigue and fever.  Respiratory: Negative for cough, chest tightness, shortness of breath and wheezing.   Cardiovascular: Negative for chest pain, palpitations and leg swelling.  Gastrointestinal: Negative for diarrhea, nausea and vomiting.  Musculoskeletal: Negative for neck pain.  Neurological: Positive for dizziness. Negative for syncope, light-headedness and headaches.  Psychiatric/Behavioral: Negative for sleep disturbance. The patient is not nervous/anxious.     Patient Active Problem List   Diagnosis Date Noted  . Low back pain 04/19/2017  . Lightheadedness 10/26/2016  . Eustachian tube dysfunction, right 08/17/2016  . Allergic rhinitis 08/17/2016  . Erectile dysfunction 08/17/2016  . Diastolic dysfunction 56/38/7564  . Syncope 04/04/2016  . Weakness 03/26/2015  . Malignant neoplasm of prostate ([place]) 02/23/2015  . Chest pain 10/01/2013  . Cough 08/04/2013  . Asthmatic bronchitis 07/15/2013  . Microhematuria 05/09/2012  . Constipation 08/13/2011  .  Ventral hernia 08/13/2011  . Umbilical hernia 33/29/5188  . Preventative health care 04/25/2011  . Nocturia 06/29/2010  . BENIGN PROSTATIC HYPERTROPHY 05/06/2010  . BACK PAIN 05/06/2010  . SKIN LESION 11/30/2008  . FREQUENCY, URINARY 11/30/2008  . COMMON MIGRAINE 07/31/2007  . GERD 07/31/2007  . OVERACTIVE BLADDER 03/06/2007  . Hyperlipidemia 01/04/2007  . Essential hypertension 01/04/2007    Current Outpatient Medications on File Prior to Visit  Medication Sig Dispense Refill  . aspirin 81 MG tablet Take 81 mg by mouth daily.     Marland Kitchen atorvastatin (LIPITOR) 10 MG tablet Take 1 tablet (10 mg total) by mouth daily. 90 tablet 1  . benazepril (LOTENSIN) 10 MG tablet Take 1 tablet (10 mg total) by mouth daily. 90 tablet 1  . fesoterodine (TOVIAZ) 4 MG TB24 tablet Take 1 tablet (4 mg total) by mouth daily. 90 tablet 3  . mirabegron ER (MYRBETRIQ) 50 MG TB24 tablet Take 50 mg by mouth daily.    . polyethylene glycol (MIRALAX / GLYCOLAX) packet Take 17 g by mouth as needed.     . sildenafil (REVATIO) 20 MG tablet Take 3 tabs by mouth every day as needed 30 tablet 11  . Tamsulosin HCl (FLOMAX) 0.4 MG CAPS Take 1 capsule (0.4 mg total) by mouth daily. 30 capsule 11   No current facility-administered medications on file prior to visit.     Allergies  Allergen Reactions  . Celecoxib Other (See Comments)    Stroke like symptoms  . Crestor [Rosuvastatin]     weakness  . Detrol [Tolterodine] Other (See Comments)    headache  .  Ditropan [Oxybutynin] Other (See Comments)    Dry mouth     Objective:  BP (!) 155/79   Pulse 66   Temp 97.9 F (36.6 C) (Oral)   Resp 17   Ht 5\' 7"  (1.702 m)   Wt 204 lb (92.5 kg)   SpO2 98%   BMI 31.95 kg/m   Physical Exam  Constitutional: He is oriented to person, place, and time. He appears well-developed and well-nourished.  Cardiovascular: Normal rate and regular rhythm.  Musculoskeletal:       Right ankle: He exhibits no swelling.       Left  ankle: He exhibits no swelling.       Right foot: There is no swelling.       Left foot: There is no swelling.  Neurological: He is alert and oriented to person, place, and time.  Skin: Skin is warm and dry.  Psychiatric: He has a normal mood and affect. His behavior is normal. Judgment and thought content normal.  Vitals reviewed.   Assessment and Plan :  1. Essential hypertension - Pt presents for f/u b/l feet swelling. Last OV 3 weeks ago d/c'd Norvasc. Swelling has since resolved. Today he c/o intermittent dizziness. Plan to decrease benazepril to 10 mg and add HCTZ 12mg  qd. If he has problems with urinary frequency, okay to d//c HCTZ and start taking benazepril 15mg . Pt asked to keep blood pressure log. RTC in 3 weeks for f/u. Consider cards referral - benazepril (LOTENSIN) 10 MG tablet; Take 1.5 tablets (15 mg total) by mouth daily.  Dispense: 90 tablet; Refill: 1  2. Dizziness 3. Elevated blood pressure reading - Recheck vitals - benazepril-hydrochlorthiazide (LOTENSIN HCT) 5-6.25 MG tablet; Take 1 tablet by mouth daily.  Dispense: 60 tablet; Refill: 3    Whitney Jama Mcmiller, PA-C  [facility] at [place] 10/23/2017 9:05 AM

## 2017-11-01 DIAGNOSIS — M79644 Pain in right finger(s): Secondary | ICD-10-CM | POA: Diagnosis not present

## 2017-11-11 ENCOUNTER — Other Ambulatory Visit: Payer: Self-pay | Admitting: Internal Medicine

## 2017-11-17 ENCOUNTER — Ambulatory Visit (INDEPENDENT_AMBULATORY_CARE_PROVIDER_SITE_OTHER): Payer: Medicare Other

## 2017-11-17 ENCOUNTER — Ambulatory Visit (HOSPITAL_COMMUNITY)
Admission: EM | Admit: 2017-11-17 | Discharge: 2017-11-17 | Disposition: A | Discharge status: Home / Self Care | Payer: Medicare Other | Attending: Internal Medicine | Admitting: Internal Medicine

## 2017-11-17 ENCOUNTER — Encounter (HOSPITAL_COMMUNITY): Payer: Self-pay | Admitting: Emergency Medicine

## 2017-11-17 DIAGNOSIS — J40 Bronchitis, not specified as acute or chronic: Secondary | ICD-10-CM

## 2017-11-17 DIAGNOSIS — R079 Chest pain, unspecified: Secondary | ICD-10-CM | POA: Diagnosis not present

## 2017-11-17 DIAGNOSIS — R05 Cough: Secondary | ICD-10-CM | POA: Diagnosis not present

## 2017-11-17 DIAGNOSIS — R0602 Shortness of breath: Secondary | ICD-10-CM | POA: Diagnosis not present

## 2017-11-17 MED ORDER — IPRATROPIUM-ALBUTEROL 0.5-2.5 (3) MG/3ML IN SOLN
3.0000 mL | Freq: Once | RESPIRATORY_TRACT | Status: AC
  Administered 2017-11-17: 3 mL via RESPIRATORY_TRACT

## 2017-11-17 MED ORDER — IPRATROPIUM-ALBUTEROL 0.5-2.5 (3) MG/3ML IN SOLN
RESPIRATORY_TRACT | Status: AC
  Filled 2017-11-17: qty 3

## 2017-11-17 MED ORDER — DOXYCYCLINE HYCLATE 100 MG PO CAPS: 100.0000 mg | ORAL_CAPSULE | Freq: Two times a day (BID) | ORAL | 0 refills | Status: DC

## 2017-11-17 MED ORDER — PREDNISONE 10 MG (21) PO TBPK: ORAL_TABLET | Freq: Every day | ORAL | 0 refills | Status: DC

## 2017-11-17 NOTE — ED Provider Notes (Signed)
[facility]    CSN: [id number] Arrival date & time: 11/17/17  1122     History   Chief Complaint Chief Complaint  Patient presents with  . Cough  . Shortness of Breath    HPI [name] [name] is a 67 y.o. male.   67 y.o. male presents with SHOB X 2 days with nonproductive cough. Patient states that "it feels like it did when I had bronchitits. Condition is acute in nature. Condition is made better by nothing. Condition is made worse by taking a deep breath and laying down. Patient denies any tre prior to there arrival at this facility.       Past Medical History:  Diagnosis Date  . BENIGN PROSTATIC HYPERTROPHY 05/06/2010  . COMMON MIGRAINE 07/31/2007  . GERD 07/31/2007  . HYPERLIPIDEMIA 01/04/2007  . HYPERTENSION 01/04/2007  . OVERACTIVE BLADDER 03/06/2007  . Prostate cancer ([place]) 02/04/15    Patient Active Problem List   Diagnosis Date Noted  . Low back pain 04/19/2017  . Lightheadedness 10/26/2016  . Eustachian tube dysfunction, right 08/17/2016  . Allergic rhinitis 08/17/2016  . Erectile dysfunction 08/17/2016  . Diastolic dysfunction 08/65/7846  . Syncope 04/04/2016  . Weakness 03/26/2015  . Malignant neoplasm of prostate ([place]) 02/23/2015  . Chest pain 10/01/2013  . Cough 08/04/2013  . Asthmatic bronchitis 07/15/2013  . Microhematuria 05/09/2012  . Constipation 08/13/2011  . Ventral hernia 08/13/2011  . Umbilical hernia 96/29/5284  . Preventative health care 04/25/2011  . Nocturia 06/29/2010  . BENIGN PROSTATIC HYPERTROPHY 05/06/2010  . BACK PAIN 05/06/2010  . SKIN LESION 11/30/2008  . FREQUENCY, URINARY 11/30/2008  . COMMON MIGRAINE 07/31/2007  . GERD 07/31/2007  . OVERACTIVE BLADDER 03/06/2007  . Hyperlipidemia 01/04/2007  . Essential hypertension 01/04/2007    History reviewed. No pertinent surgical history.     Home Medications    Prior to Admission medications   Medication Sig Start Date End Date Taking? Authorizing Provider    amLODipine (NORVASC) 10 MG tablet TAKE 1 TABLET BY MOUTH EVERY DAY 11/12/17   Biagio Borg, MD  aspirin 81 MG tablet Take 81 mg by mouth daily.     [provider]  atorvastatin (LIPITOR) 10 MG tablet TAKE 1 TABLET BY MOUTH EVERY DAY 11/12/17   Biagio Borg, MD  benazepril (LOTENSIN) 10 MG tablet Take 1.5 tablets (15 mg total) by mouth daily. 10/23/17   McVey, Gelene Mink, PA-C  benazepril (LOTENSIN) 10 MG tablet TAKE 1 TABLET BY MOUTH EVERY DAY 11/12/17   Biagio Borg, MD  benazepril-hydrochlorthiazide (LOTENSIN HCT) 5-6.25 MG tablet Take 1 tablet by mouth daily. 10/23/17   McVey, Gelene Mink, PA-C  fesoterodine (TOVIAZ) 4 MG TB24 tablet Take 1 tablet (4 mg total) by mouth daily. 09/19/17   Biagio Borg, MD  mirabegron ER (MYRBETRIQ) 50 MG TB24 tablet Take 50 mg by mouth daily.    [provider]  polyethylene glycol (MIRALAX / GLYCOLAX) packet Take 17 g by mouth as needed.     [provider]  sildenafil (REVATIO) 20 MG tablet Take 3 tabs by mouth every day as needed 09/30/16   Biagio Borg, MD  Tamsulosin HCl (FLOMAX) 0.4 MG CAPS Take 1 capsule (0.4 mg total) by mouth daily. 05/08/11   Biagio Borg, MD    Family History Family History  Problem Relation Age of Onset  . Lung cancer Father 47  . Heart disease Brother 15       died from MI  Social History Social History   Tobacco Use  . Smoking status: Never Smoker  . Smokeless tobacco: Never Used  Substance Use Topics  . Alcohol use: No    Alcohol/week: 0.0 oz  . Drug use: No     Allergies   Celecoxib; Crestor [rosuvastatin]; Detrol [tolterodine]; and Ditropan [oxybutynin]   Review of Systems Review of Systems  Constitutional: Negative for chills and fever.  HENT: Negative for ear pain and sore throat.   Eyes: Negative for pain and visual disturbance.  Respiratory: Positive for cough. Negative for shortness of breath. Choking:  productive clear.   Cardiovascular: Negative for chest  pain and palpitations.  Gastrointestinal: Negative for abdominal pain and vomiting.  Genitourinary: Negative for dysuria and hematuria.  Musculoskeletal: Negative for arthralgias and back pain.  Skin: Negative for color change and rash.  Neurological: Negative for seizures and syncope.  All other systems reviewed and are negative.    Physical Exam Triage Vital Signs ED Triage Vitals [11/17/17 1225]  Enc Vitals Group     BP (!) 162/84     Pulse Rate (!) 58     Resp 16     Temp 98.3 F (36.8 C)     Temp src      SpO2 100 %     Weight      Height      Head Circumference      Peak Flow      Pain Score      Pain Loc      Pain Edu?      Excl. in [place]?    No data found.  Updated Vital Signs BP (!) 162/84   Pulse (!) 58   Temp 98.3 F (36.8 C)   Resp 16   SpO2 100%   Visual Acuity Right Eye Distance:   Left Eye Distance:   Bilateral Distance:    Right Eye Near:   Left Eye Near:    Bilateral Near:     Physical Exam  Constitutional: He is oriented to person, place, and time. He appears well-developed and well-nourished.  HENT:  Head: Normocephalic.  Neck: Normal range of motion.  Pulmonary/Chest: Effort normal.  wheeszing bilateral apex  Musculoskeletal: Normal range of motion.  Neurological: He is alert and oriented to person, place, and time.  Skin: Skin is dry.  Psychiatric: He has a normal mood and affect.  Nursing note and vitals reviewed.    UC Treatments / Results  Labs (all labs ordered are listed, but only abnormal results are displayed) Labs Reviewed - No data to display  EKG None  Radiology No results found.  Procedures Procedures (including critical care time)  Medications Ordered in UC Medications  ipratropium-albuterol (DUONEB) 0.5-2.5 (3) MG/3ML nebulizer solution 3 mL (has no administration in time range)    Initial Impression / Assessment and Plan / UC Course  I have reviewed the triage vital signs and the nursing  notes.  Pertinent labs & imaging results that were available during my care of the patient were reviewed by me and considered in my medical decision making (see chart for details).      Final Clinical Impressions(s) / UC Diagnoses   Final diagnoses:  None   Discharge Instructions   None    ED Prescriptions    None     Controlled Substance Prescriptions [place] Controlled Substance Registry consulted? Not Applicable   Jacqualine Mau, NP 11/17/17 1342

## 2017-11-17 NOTE — Discharge Instructions (Addendum)
Continue to push fluids and take over the counter medications as directed on the back of the box for symptomatic relief.  ° °

## 2017-11-17 NOTE — ED Triage Notes (Signed)
Pt states he has a pain in his L chest when he takes a deep breath, states hes had bronchitis and it feels the same. Also c/o dry cough.

## 2017-11-20 ENCOUNTER — Encounter: Payer: Self-pay | Admitting: Physician Assistant

## 2017-11-20 ENCOUNTER — Ambulatory Visit (INDEPENDENT_AMBULATORY_CARE_PROVIDER_SITE_OTHER): Payer: Medicare Other | Admitting: Physician Assistant

## 2017-11-20 VITALS — BP 156/94 | HR 56 | Temp 98.5°F | Resp 17 | Ht 67.0 in | Wt 204.0 lb

## 2017-11-20 DIAGNOSIS — R030 Elevated blood-pressure reading, without diagnosis of hypertension: Secondary | ICD-10-CM

## 2017-11-20 DIAGNOSIS — I10 Essential (primary) hypertension: Secondary | ICD-10-CM

## 2017-11-20 NOTE — Progress Notes (Signed)
[name] [name]  MRN: [id number] DOB: 1950/05/18  PCP: Biagio Borg, MD  Subjective:  Pt is a pleasant 67 year old male past medical history of hypertension presents to clinic for follow-up hypertension.  He was DC'd from Norvasc about 2 months ago due to bilateral lower extremity edema.  Started on benazepril-hydrochlorthiazide (LOTENSIN HCT) 10-12.50 MG qam at his last OV one month ago. He is not taking this - he was taking lotensin 5-6.25 mg and it wasn't working "my blood pressure was too high". He is currently taking Benazepril 20mg . Home blood pressures are around 120's/80's. Denies dizziness, LES swelling, palpitations, HA.   He has appt with his PCP Dr. Jenny Reichmann at [facility] in about 4 months.   Review of Systems  Constitutional: Negative for chills and diaphoresis.  Respiratory: Negative for cough, chest tightness, shortness of breath and wheezing.   Cardiovascular: Negative for chest pain, palpitations and leg swelling.  Gastrointestinal: Negative for diarrhea, nausea and vomiting.  Musculoskeletal: Negative for neck pain.  Neurological: Negative for dizziness, syncope, light-headedness and headaches.  Psychiatric/Behavioral: Negative for sleep disturbance. The patient is not nervous/anxious.     Patient Active Problem List   Diagnosis Date Noted  . Low back pain 04/19/2017  . Lightheadedness 10/26/2016  . Eustachian tube dysfunction, right 08/17/2016  . Allergic rhinitis 08/17/2016  . Erectile dysfunction 08/17/2016  . Diastolic dysfunction 56/43/3295  . Syncope 04/04/2016  . Weakness 03/26/2015  . Malignant neoplasm of prostate ([place]) 02/23/2015  . Chest pain 10/01/2013  . Cough 08/04/2013  . Asthmatic bronchitis 07/15/2013  . Microhematuria 05/09/2012  . Constipation 08/13/2011  . Ventral hernia 08/13/2011  . Umbilical hernia 18/84/1660  . Preventative health care 04/25/2011  . Nocturia 06/29/2010  . BENIGN PROSTATIC HYPERTROPHY 05/06/2010    . BACK PAIN 05/06/2010  . SKIN LESION 11/30/2008  . FREQUENCY, URINARY 11/30/2008  . COMMON MIGRAINE 07/31/2007  . GERD 07/31/2007  . OVERACTIVE BLADDER 03/06/2007  . Hyperlipidemia 01/04/2007  . Essential hypertension 01/04/2007    Current Outpatient Medications on File Prior to Visit  Medication Sig Dispense Refill  . aspirin 81 MG tablet Take 81 mg by mouth daily.     Marland Kitchen atorvastatin (LIPITOR) 10 MG tablet TAKE 1 TABLET BY MOUTH EVERY DAY 90 tablet 1  . benazepril (LOTENSIN) 10 MG tablet Take 1.5 tablets (15 mg total) by mouth daily. 90 tablet 1  . benazepril (LOTENSIN) 10 MG tablet TAKE 1 TABLET BY MOUTH EVERY DAY 90 tablet 1  . doxycycline (VIBRAMYCIN) 100 MG capsule Take 1 capsule (100 mg total) by mouth 2 (two) times daily. 20 capsule 0  . fesoterodine (TOVIAZ) 4 MG TB24 tablet Take 1 tablet (4 mg total) by mouth daily. 90 tablet 3  . mirabegron ER (MYRBETRIQ) 50 MG TB24 tablet Take 50 mg by mouth daily.    . polyethylene glycol (MIRALAX / GLYCOLAX) packet Take 17 g by mouth as needed.     . predniSONE (STERAPRED UNI-PAK 21 TAB) 10 MG (21) TBPK tablet Take by mouth daily. Take 6 tabs by mouth daily  for 2 days, then 5 tabs for 2 days, then 4 tabs for 2 days, then 3 tabs for 2 days, 2 tabs for 2 days, then 1 tab by mouth daily for 2 days 42 tablet 0  . Tamsulosin HCl (FLOMAX) 0.4 MG CAPS Take 1 capsule (0.4 mg total) by mouth daily. 30 capsule 11  . amLODipine (NORVASC) 10 MG tablet TAKE 1 TABLET BY MOUTH EVERY DAY (  Patient not taking: Reported on 11/20/2017) 90 tablet 1  . benazepril-hydrochlorthiazide (LOTENSIN HCT) 5-6.25 MG tablet Take 1 tablet by mouth daily. (Patient not taking: Reported on 11/20/2017) 60 tablet 3  . sildenafil (REVATIO) 20 MG tablet Take 3 tabs by mouth every day as needed (Patient not taking: Reported on 11/20/2017) 30 tablet 11   No current facility-administered medications on file prior to visit.     Allergies  Allergen Reactions  . Celecoxib Other (See  Comments)    Stroke like symptoms  . Crestor [Rosuvastatin]     weakness  . Detrol [Tolterodine] Other (See Comments)    headache  . Ditropan [Oxybutynin] Other (See Comments)    Dry mouth     Objective:  BP (!) 151/85   Pulse (!) 56   Temp 98.5 F (36.9 C) (Oral)   Resp 17   Ht 5\' 7"  (1.702 m)   Wt 204 lb (92.5 kg)   SpO2 98%   BMI 31.95 kg/m   Physical Exam  Constitutional: He is oriented to person, place, and time. He appears well-developed and well-nourished.  Cardiovascular: Normal rate and regular rhythm.  Musculoskeletal:       Right ankle: He exhibits no swelling.       Left ankle: He exhibits no swelling.  Neurological: He is alert and oriented to person, place, and time.  Skin: Skin is warm and dry.  Psychiatric: He has a normal mood and affect. His behavior is normal. Judgment and thought content normal.  Vitals reviewed.   Assessment and Plan :  1. Essential hypertension 2. Elevated blood pressure reading - Pt presents for f/u HTN. Feeling well on Benazepril 20mg  qd. Denies side effects. Home blood pressures wnl. F/u with PCP in 4 months.     Mercer Pod, PA-C  [facility] 11/20/2017 8:58 AM

## 2017-11-20 NOTE — Patient Instructions (Addendum)
Continue taking home blood pressures.  Follow-up with your primary care provider.   Please let me know if you need a refill of your blood pressure medication before you get in to see your primary care provider at [facility].    DASH Eating Plan DASH stands for "Dietary Approaches to Stop Hypertension." The DASH eating plan is a healthy eating plan that has been shown to reduce high blood pressure (hypertension). It may also reduce your risk for type 2 diabetes, heart disease, and stroke. The DASH eating plan may also help with weight loss. What are tips for following this plan? General guidelines  Avoid eating more than 2,300 mg (milligrams) of salt (sodium) a day. If you have hypertension, you may need to reduce your sodium intake to 1,500 mg a day.  Limit alcohol intake to no more than 1 drink a day for nonpregnant women and 2 drinks a day for men. One drink equals 12 oz of beer, 5 oz of wine, or 1 oz of hard liquor.  Work with your health care provider to maintain a healthy body weight or to lose weight. Ask what an ideal weight is for you.  Get at least 30 minutes of exercise that causes your heart to beat faster (aerobic exercise) most days of the week. Activities may include walking, swimming, or biking.  Work with your health care provider or diet and nutrition specialist (dietitian) to adjust your eating plan to your individual calorie needs. Reading food labels  Check food labels for the amount of sodium per serving. Choose foods with less than 5 percent of the Daily Value of sodium. Generally, foods with less than 300 mg of sodium per serving fit into this eating plan.  To find whole grains, look for the word "whole" as the first word in the ingredient list. Shopping  Buy products labeled as "low-sodium" or "no salt added."  Buy fresh foods. Avoid canned foods and premade or frozen meals. Cooking  Avoid adding salt when cooking. Use salt-free seasonings or herbs instead of  table salt or sea salt. Check with your health care provider or pharmacist before using salt substitutes.  Do not fry foods. Cook foods using healthy methods such as baking, boiling, grilling, and broiling instead.  Cook with heart-healthy oils, such as olive, canola, soybean, or sunflower oil. Meal planning   Eat a balanced diet that includes: ? 5 or more servings of fruits and vegetables each day. At each meal, try to fill half of your plate with fruits and vegetables. ? Up to 6-8 servings of whole grains each day. ? Less than 6 oz of lean meat, poultry, or fish each day. A 3-oz serving of meat is about the same size as a deck of cards. One egg equals 1 oz. ? 2 servings of low-fat dairy each day. ? A serving of nuts, seeds, or beans 5 times each week. ? Heart-healthy fats. Healthy fats called Omega-3 fatty acids are found in foods such as flaxseeds and coldwater fish, like sardines, salmon, and mackerel.  Limit how much you eat of the following: ? Canned or prepackaged foods. ? Food that is high in trans fat, such as fried foods. ? Food that is high in saturated fat, such as fatty meat. ? Sweets, desserts, sugary drinks, and other foods with added sugar. ? Full-fat dairy products.  Do not salt foods before eating.  Try to eat at least 2 vegetarian meals each week.  Eat more home-cooked food and less restaurant,  buffet, and fast food.  When eating at a restaurant, ask that your food be prepared with less salt or no salt, if possible. What foods are recommended? The items listed may not be a complete list. Talk with your dietitian about what dietary choices are best for you. Grains Whole-grain or whole-wheat bread. Whole-grain or whole-wheat pasta. Brown rice. [place]. Bulgur. Whole-grain and low-sodium cereals. Pita bread. Low-fat, low-sodium crackers. Whole-wheat flour tortillas. Vegetables Fresh or frozen vegetables (raw, steamed, roasted, or grilled). Low-sodium or  reduced-sodium tomato and vegetable juice. Low-sodium or reduced-sodium tomato sauce and tomato paste. Low-sodium or reduced-sodium canned vegetables. Fruits All fresh, dried, or frozen fruit. Canned fruit in natural juice (without added sugar). Meat and other protein foods Skinless chicken or [place]. Ground chicken or [place]. Pork with fat trimmed off. Fish and seafood. Egg whites. Dried beans, peas, or lentils. Unsalted nuts, nut butters, and seeds. Unsalted canned beans. Lean cuts of beef with fat trimmed off. Low-sodium, lean deli meat. Dairy Low-fat (1%) or fat-free (skim) milk. Fat-free, low-fat, or reduced-fat cheeses. Nonfat, low-sodium ricotta or cottage cheese. Low-fat or nonfat yogurt. Low-fat, low-sodium cheese. Fats and oils Soft margarine without trans fats. Vegetable oil. Low-fat, reduced-fat, or light mayonnaise and salad dressings (reduced-sodium). Canola, safflower, olive, soybean, and sunflower oils. Avocado. Seasoning and other foods Herbs. Spices. Seasoning mixes without salt. Unsalted popcorn and pretzels. Fat-free sweets. What foods are not recommended? The items listed may not be a complete list. Talk with your dietitian about what dietary choices are best for you. Grains Baked goods made with fat, such as croissants, muffins, or some breads. Dry pasta or rice meal packs. Vegetables Creamed or fried vegetables. Vegetables in a cheese sauce. Regular canned vegetables (not low-sodium or reduced-sodium). Regular canned tomato sauce and paste (not low-sodium or reduced-sodium). Regular tomato and vegetable juice (not low-sodium or reduced-sodium). Angie Fava. Olives. Fruits Canned fruit in a light or heavy syrup. Fried fruit. Fruit in cream or butter sauce. Meat and other protein foods Fatty cuts of meat. Ribs. Fried meat. Berniece Salines. Sausage. Bologna and other processed lunch meats. Salami. Fatback. Hotdogs. Bratwurst. Salted nuts and seeds. Canned beans with added salt. Canned or  smoked fish. Whole eggs or egg yolks. Chicken or [place] with skin. Dairy Whole or 2% milk, cream, and half-and-half. Whole or full-fat cream cheese. Whole-fat or sweetened yogurt. Full-fat cheese. Nondairy creamers. Whipped toppings. Processed cheese and cheese spreads. Fats and oils Butter. Stick margarine. Lard. Shortening. Ghee. Bacon fat. Tropical oils, such as coconut, palm kernel, or palm oil. Seasoning and other foods Salted popcorn and pretzels. Onion salt, garlic salt, seasoned salt, table salt, and sea salt. Worcestershire sauce. Tartar sauce. Barbecue sauce. Teriyaki sauce. Soy sauce, including reduced-sodium. Steak sauce. Canned and packaged gravies. Fish sauce. Oyster sauce. Cocktail sauce. Horseradish that you find on the shelf. Ketchup. Mustard. Meat flavorings and tenderizers. Bouillon cubes. Hot sauce and Tabasco sauce. Premade or packaged marinades. Premade or packaged taco seasonings. Relishes. Regular salad dressings. Where to find more information:  National Heart, Lung, and [place]: https://wilson-eaton.com/  American Heart Association: www.heart.org Summary  The DASH eating plan is a healthy eating plan that has been shown to reduce high blood pressure (hypertension). It may also reduce your risk for type 2 diabetes, heart disease, and stroke.  With the DASH eating plan, you should limit salt (sodium) intake to 2,300 mg a day. If you have hypertension, you may need to reduce your sodium intake to 1,500 mg a day.  When  on the DASH eating plan, aim to eat more fresh fruits and vegetables, whole grains, lean proteins, low-fat dairy, and heart-healthy fats.  Work with your health care provider or diet and nutrition specialist (dietitian) to adjust your eating plan to your individual calorie needs. This information is not intended to replace advice given to you by your health care provider. Make sure you discuss any questions you have with your health care provider. Document  Released: 04/13/2011 Document Revised: 04/17/2016 Document Reviewed: 04/17/2016 Elsevier Interactive Patient Education  2018 [employer].  IF you received an x-ray today, you will receive an invoice from [facility]. Please contact [facility] at [phone] with questions or concerns regarding your invoice.   IF you received labwork today, you will receive an invoice from [place] [facility]. Please contact [facility] at [phone] with questions or concerns regarding your invoice.   Our billing staff will not be able to assist you with questions regarding bills from these companies.  You will be contacted with the lab results as soon as they are available. The fastest way to get your results is to activate your My Chart account. Instructions are located on the last page of this paperwork. If you have not heard from [place] regarding the results in 2 weeks, please contact this office.

## 2017-12-05 ENCOUNTER — Ambulatory Visit (AMBULATORY_SURGERY_CENTER): Payer: Self-pay | Admitting: *Deleted

## 2017-12-05 VITALS — Ht 67.0 in | Wt 201.0 lb

## 2017-12-05 DIAGNOSIS — Z1211 Encounter for screening for malignant neoplasm of colon: Secondary | ICD-10-CM

## 2017-12-05 MED ORDER — PEG-KCL-NACL-NASULF-NA ASC-C 140 G PO SOLR: 1.0000 | Freq: Once | ORAL | 0 refills | Status: AC

## 2017-12-05 NOTE — Progress Notes (Signed)
Patient denies any allergies to eggs or soy. Patient denies any problems with anesthesia/sedation. Patient denies any oxygen use at home. Patient denies taking any diet/weight loss medications or blood thinners. EMMI education assisgned to patient on colonoscopy, this was explained and instructions given to patient. 

## 2017-12-19 ENCOUNTER — Encounter: Payer: Self-pay | Admitting: Internal Medicine

## 2017-12-19 ENCOUNTER — Ambulatory Visit (AMBULATORY_SURGERY_CENTER): Payer: Medicare Other | Admitting: Internal Medicine

## 2017-12-19 VITALS — BP 101/57 | HR 55 | Temp 98.2°F | Resp 11 | Ht 67.0 in | Wt 201.0 lb

## 2017-12-19 DIAGNOSIS — R55 Syncope and collapse: Secondary | ICD-10-CM | POA: Diagnosis not present

## 2017-12-19 DIAGNOSIS — I440 Atrioventricular block, first degree: Secondary | ICD-10-CM | POA: Diagnosis not present

## 2017-12-19 DIAGNOSIS — Z8546 Personal history of malignant neoplasm of prostate: Secondary | ICD-10-CM | POA: Diagnosis not present

## 2017-12-19 DIAGNOSIS — N400 Enlarged prostate without lower urinary tract symptoms: Secondary | ICD-10-CM | POA: Diagnosis not present

## 2017-12-19 DIAGNOSIS — Z1211 Encounter for screening for malignant neoplasm of colon: Secondary | ICD-10-CM | POA: Diagnosis not present

## 2017-12-19 DIAGNOSIS — I10 Essential (primary) hypertension: Secondary | ICD-10-CM | POA: Diagnosis not present

## 2017-12-19 MED ORDER — SODIUM CHLORIDE 0.9 % IV SOLN: 500.0000 mL | Freq: Once | INTRAVENOUS | Status: DC

## 2017-12-19 NOTE — Patient Instructions (Signed)
HANDOUT GIVEN FOR DIVERTICULOSIS  YOU HAD AN ENDOSCOPIC PROCEDURE TODAY AT [facility]:   Refer to the procedure report that was given to you for any specific questions about what was found during the examination.  If the procedure report does not answer your questions, please call your gastroenterologist to clarify.  If you requested that your care partner not be given the details of your procedure findings, then the procedure report has been included in a sealed envelope for you to review at your convenience later.  YOU SHOULD EXPECT: Some feelings of bloating in the abdomen. Passage of more gas than usual.  Walking can help get rid of the air that was put into your GI tract during the procedure and reduce the bloating. If you had a lower endoscopy (such as a colonoscopy or flexible sigmoidoscopy) you may notice spotting of blood in your stool or on the toilet paper. If you underwent a bowel prep for your procedure, you may not have a normal bowel movement for a few days.  Please Note:  You might notice some irritation and congestion in your nose or some drainage.  This is from the oxygen used during your procedure.  There is no need for concern and it should clear up in a day or so.  SYMPTOMS TO REPORT IMMEDIATELY:   Following lower endoscopy (colonoscopy or flexible sigmoidoscopy):  Excessive amounts of blood in the stool  Significant tenderness or worsening of abdominal pains  Swelling of the abdomen that is new, acute  Fever of 100F or higher  For urgent or emergent issues, a gastroenterologist can be reached at any hour by calling [phone].   DIET:  We do recommend a small meal at first, but then you may proceed to your regular diet.  Drink plenty of fluids but you should avoid alcoholic beverages for 24 hours.  ACTIVITY:  You should plan to take it easy for the rest of today and you should NOT DRIVE or use heavy machinery until tomorrow (because of the sedation  medicines used during the test).    FOLLOW UP: Our staff will call the number listed on your records the next business day following your procedure to check on you and address any questions or concerns that you may have regarding the information given to you following your procedure. If we do not reach you, we will leave a message.  However, if you are feeling well and you are not experiencing any problems, there is no need to return our call.  We will assume that you have returned to your regular daily activities without incident.  If any biopsies were taken you will be contacted by phone or by letter within the next 1-3 weeks.  Please call us at [phone] if you have not heard about the biopsies in 3 weeks.    SIGNATURES/CONFIDENTIALITY: You and/or your care partner have signed paperwork which will be entered into your electronic medical record.  These signatures attest to the fact that that the information above on your After Visit Summary has been reviewed and is understood.  Full responsibility of the confidentiality of this discharge information lies with you and/or your care-partner.

## 2017-12-19 NOTE — Op Note (Signed)
[name] Procedure Date: 12/19/2017 1:37 PM MRN: [id number] Endoscopist: Docia Chuck. [name] [name] , MD Age: 67 Referring MD:  Date of Birth: 07/05/1950 Gender: Male Account #: [id number] Procedure:                Colonoscopy Indications:              Screening for colorectal malignant neoplasm.                            Negative index exam June 2009 Medicines:                Monitored Anesthesia Care Procedure:                Pre-Anesthesia Assessment:                           - Prior to the procedure, a History and Physical                            was performed, and patient medications and                            allergies were reviewed. The patient's tolerance of                            previous anesthesia was also reviewed. The risks                            and benefits of the procedure and the sedation                            options and risks were discussed with the patient.                            All questions were answered, and informed consent                            was obtained. Prior Anticoagulants: The patient has                            taken no previous anticoagulant or antiplatelet                            agents. ASA Grade Assessment: II - A patient with                            mild systemic disease. After reviewing the risks                            and benefits, the patient was deemed in                            satisfactory condition to undergo the procedure.  After obtaining informed consent, the colonoscope                            was passed under direct vision. Throughout the                            procedure, the patient's blood pressure, pulse, and                            oxygen saturations were monitored continuously. The                            Colonoscope was introduced through the anus and                            advanced to the the cecum, identified by                             appendiceal orifice and ileocecal valve. The                            ileocecal valve, appendiceal orifice, and rectum                            were photographed. The quality of the bowel                            preparation was excellent. The colonoscopy was                            performed without difficulty. The patient tolerated                            the procedure well. The bowel preparation used was                            SUPREP. Scope In: 1:45:15 PM Scope Out: 1:58:40 PM Scope Withdrawal Time: 0 hours 9 minutes 39 seconds  Total Procedure Duration: 0 hours 13 minutes 25 seconds  Findings:                 Multiple small and large-mouthed diverticula were                            found in the left colon and right colon.                           The exam was otherwise without abnormality on                            direct and retroflexion views. Complications:            No immediate complications. Estimated blood loss:  None. Estimated Blood Loss:     Estimated blood loss: none. Impression:               - Diverticulosis in the left colon and in the right                            colon.                           - The examination was otherwise normal on direct                            and retroflexion views.                           - No specimens collected. Recommendation:           - Repeat colonoscopy in 10 years for screening                            purposes.                           - Patient has a contact number available for                            emergencies. The signs and symptoms of potential                            delayed complications were discussed with the                            patient. Return to normal activities tomorrow.                            Written discharge instructions were provided to the                            patient.                           -  Resume previous diet.                           - Continue present medications. Docia Chuck. [name] Pastor, MD 12/19/2017 2:02:08 PM This report has been signed electronically.

## 2017-12-19 NOTE — Progress Notes (Signed)
Report to PACU, RN, vss, BBS= Clear.  

## 2017-12-19 NOTE — Progress Notes (Signed)
Pt's states no medical or surgical changes since previsit or office visit. 

## 2017-12-20 ENCOUNTER — Telehealth: Payer: Self-pay | Admitting: *Deleted

## 2017-12-20 NOTE — Telephone Encounter (Signed)
  Follow up Call-  Call back number 12/19/2017  Post procedure Call Back phone  # [phone]  Permission to leave phone message Yes  Some recent data might be hidden     Patient questions:  Do you have a fever, pain , or abdominal swelling? No. Pain Score  0 *  Have you tolerated food without any problems? Yes.    Have you been able to return to your normal activities? Yes.    Do you have any questions about your discharge instructions: Diet   No. Medications  No. Follow up visit  No.  Do you have questions or concerns about your Care? No.  Actions: * If pain score is 4 or above: No action needed, pain <4.

## 2018-01-07 ENCOUNTER — Other Ambulatory Visit: Payer: Self-pay | Admitting: Internal Medicine

## 2018-01-09 DIAGNOSIS — H18413 Arcus senilis, bilateral: Secondary | ICD-10-CM | POA: Diagnosis not present

## 2018-01-09 DIAGNOSIS — Z9849 Cataract extraction status, unspecified eye: Secondary | ICD-10-CM | POA: Diagnosis not present

## 2018-01-09 DIAGNOSIS — H00021 Hordeolum internum right upper eyelid: Secondary | ICD-10-CM | POA: Diagnosis not present

## 2018-01-09 DIAGNOSIS — H04123 Dry eye syndrome of bilateral lacrimal glands: Secondary | ICD-10-CM | POA: Diagnosis not present

## 2018-01-09 DIAGNOSIS — H0011 Chalazion right upper eyelid: Secondary | ICD-10-CM | POA: Diagnosis not present

## 2018-01-09 DIAGNOSIS — H11153 Pinguecula, bilateral: Secondary | ICD-10-CM | POA: Diagnosis not present

## 2018-01-09 DIAGNOSIS — H0100A Unspecified blepharitis right eye, upper and lower eyelids: Secondary | ICD-10-CM | POA: Diagnosis not present

## 2018-01-09 DIAGNOSIS — H401131 Primary open-angle glaucoma, bilateral, mild stage: Secondary | ICD-10-CM | POA: Diagnosis not present

## 2018-01-09 DIAGNOSIS — Z961 Presence of intraocular lens: Secondary | ICD-10-CM | POA: Diagnosis not present

## 2018-01-09 DIAGNOSIS — H0100B Unspecified blepharitis left eye, upper and lower eyelids: Secondary | ICD-10-CM | POA: Diagnosis not present

## 2018-01-09 DIAGNOSIS — H47233 Glaucomatous optic atrophy, bilateral: Secondary | ICD-10-CM | POA: Diagnosis not present

## 2018-01-09 DIAGNOSIS — H35373 Puckering of macula, bilateral: Secondary | ICD-10-CM | POA: Diagnosis not present

## 2018-02-05 ENCOUNTER — Telehealth: Payer: Self-pay | Admitting: Internal Medicine

## 2018-02-05 DIAGNOSIS — I10 Essential (primary) hypertension: Secondary | ICD-10-CM

## 2018-02-05 MED ORDER — BENAZEPRIL HCL 10 MG PO TABS
15.0000 mg | ORAL_TABLET | Freq: Every day | ORAL | 1 refills | Status: DC
Start: 2018-02-05 — End: 2018-03-26

## 2018-02-05 NOTE — Telephone Encounter (Signed)
Copied from [facility] [id number]. Topic: Quick Communication - Rx Refill/Question >> Feb 05, 2018  3:45 PM Judyann Munson wrote: Medication: benazepril (LOTENSIN) 10 MG tablet  Has the patient contacted their pharmacy?no   Preferred Pharmacy (with phone number or street name): [id number] [address], [place]. [phone] (Phone) [phone] (Fax)    Agent: Please be advised that RX refills may take up to 3 business days. We ask that you follow-up with your pharmacy.

## 2018-02-07 DIAGNOSIS — Z23 Encounter for immunization: Secondary | ICD-10-CM | POA: Diagnosis not present

## 2018-03-26 ENCOUNTER — Ambulatory Visit (INDEPENDENT_AMBULATORY_CARE_PROVIDER_SITE_OTHER): Payer: Medicare Other | Admitting: Internal Medicine

## 2018-03-26 ENCOUNTER — Encounter: Payer: Self-pay | Admitting: Internal Medicine

## 2018-03-26 ENCOUNTER — Other Ambulatory Visit (INDEPENDENT_AMBULATORY_CARE_PROVIDER_SITE_OTHER): Payer: Medicare Other

## 2018-03-26 VITALS — BP 136/88 | HR 73 | Temp 97.7°F | Ht 67.0 in | Wt 197.0 lb

## 2018-03-26 DIAGNOSIS — C61 Malignant neoplasm of prostate: Secondary | ICD-10-CM

## 2018-03-26 DIAGNOSIS — I10 Essential (primary) hypertension: Secondary | ICD-10-CM | POA: Diagnosis not present

## 2018-03-26 DIAGNOSIS — E7800 Pure hypercholesterolemia, unspecified: Secondary | ICD-10-CM | POA: Diagnosis not present

## 2018-03-26 DIAGNOSIS — R351 Nocturia: Secondary | ICD-10-CM | POA: Diagnosis not present

## 2018-03-26 DIAGNOSIS — R079 Chest pain, unspecified: Secondary | ICD-10-CM | POA: Diagnosis not present

## 2018-03-26 DIAGNOSIS — K219 Gastro-esophageal reflux disease without esophagitis: Secondary | ICD-10-CM | POA: Diagnosis not present

## 2018-03-26 DIAGNOSIS — R739 Hyperglycemia, unspecified: Secondary | ICD-10-CM | POA: Insufficient documentation

## 2018-03-26 LAB — BASIC METABOLIC PANEL
BUN: 15 mg/dL (ref 6–23)
CO2: 30 meq/L (ref 19–32)
CREATININE: 1 mg/dL (ref 0.40–1.50)
Calcium: 9.8 mg/dL (ref 8.4–10.5)
Chloride: 101 mEq/L (ref 96–112)
GFR: 95.04 mL/min (ref >60.00)
GLUCOSE: 114 mg/dL — AB (ref 70–99)
Potassium: 3.7 mEq/L (ref 3.5–5.1)
Sodium: 140 mEq/L (ref 135–145)

## 2018-03-26 LAB — CBC WITH DIFFERENTIAL/PLATELET
BASOS ABS: 0 10*3/uL (ref 0.0–0.1)
Basophils Relative: 0.4 % (ref 0.0–3.0)
Eosinophils Absolute: 0 10*3/uL (ref 0.0–0.7)
Eosinophils Relative: 0.5 % (ref 0.0–5.0)
HCT: 46.3 % (ref 39.0–52.0)
Hemoglobin: 15.5 g/dL (ref 13.0–17.0)
LYMPHS ABS: 1.5 10*3/uL (ref 0.7–4.0)
Lymphocytes Relative: 21.9 % (ref 12.0–46.0)
MCHC: 33.5 g/dL (ref 30.0–36.0)
MCV: 93.4 fl (ref 78.0–100.0)
MONOS PCT: 7.6 % (ref 3.0–12.0)
Monocytes Absolute: 0.5 10*3/uL (ref 0.1–1.0)
NEUTROS PCT: 69.6 % (ref 43.0–77.0)
Neutro Abs: 4.6 10*3/uL (ref 1.4–7.7)
Platelets: 260 10*3/uL (ref 150.0–400.0)
RBC: 4.95 Mil/uL (ref 4.22–5.81)
RDW: 13.8 % (ref 11.5–15.5)
WBC: 6.7 10*3/uL (ref 4.0–10.5)

## 2018-03-26 LAB — LIPID PANEL
CHOL/HDL RATIO: 3
Cholesterol: 182 mg/dL (ref 0–200)
HDL: 61.2 mg/dL (ref >39.00)
LDL Cholesterol: 108 mg/dL — ABNORMAL HIGH (ref 0–99)
NONHDL: 121.1
Triglycerides: 65 mg/dL (ref 0.0–149.0)
VLDL: 13 mg/dL (ref 0.0–40.0)

## 2018-03-26 LAB — URINALYSIS, ROUTINE W REFLEX MICROSCOPIC
Bilirubin Urine: NEGATIVE
Ketones, ur: NEGATIVE
LEUKOCYTES UA: NEGATIVE
Nitrite: NEGATIVE
Specific Gravity, Urine: 1.01 (ref 1.000–1.030)
TOTAL PROTEIN, URINE-UPE24: NEGATIVE
Urine Glucose: NEGATIVE
Urobilinogen, UA: 0.2 (ref 0.0–1.0)
pH: 7.5 (ref 5.0–8.0)

## 2018-03-26 LAB — HEPATIC FUNCTION PANEL
ALK PHOS: 66 U/L (ref 39–117)
ALT: 17 U/L (ref 0–53)
AST: 18 U/L (ref 0–37)
Albumin: 4.6 g/dL (ref 3.5–5.2)
BILIRUBIN DIRECT: 0.2 mg/dL (ref 0.0–0.3)
Total Bilirubin: 1 mg/dL (ref 0.2–1.2)
Total Protein: 7.7 g/dL (ref 6.0–8.3)

## 2018-03-26 LAB — PSA: PSA: 0.3 ng/mL (ref 0.10–4.00)

## 2018-03-26 LAB — TSH: TSH: 1.39 u[IU]/mL (ref 0.35–4.50)

## 2018-03-26 LAB — HEMOGLOBIN A1C: Hgb A1c MFr Bld: 6 % (ref 4.6–6.5)

## 2018-03-26 MED ORDER — TADALAFIL 5 MG PO TABS: 5.0000 mg | ORAL_TABLET | Freq: Every day | ORAL | 3 refills | Status: DC | PRN

## 2018-03-26 MED ORDER — BENAZEPRIL HCL 20 MG PO TABS: 20.0000 mg | ORAL_TABLET | Freq: Every day | ORAL | 3 refills | Status: DC

## 2018-03-26 NOTE — Assessment & Plan Note (Signed)
To f/u urology as planned 

## 2018-03-26 NOTE — Assessment & Plan Note (Signed)
stable overall by history and exam, recent data reviewed with pt, and pt to continue medical treatment as before,  to f/u any worsening symptoms or concerns  

## 2018-03-26 NOTE — Patient Instructions (Addendum)
Ok to increase the benazepril to 20 mg per day  Ok to try the daily cialis 5 mg for the prostate and getting up at night  OK to stop the sildenafil when taking the cialis  Please continue all other medications as before, and refills have been done if requested.  Please have the pharmacy call with any other refills you may need.  Please continue your efforts at being more active, low cholesterol diet, and weight control.  You are otherwise up to date with prevention measures today.  Please keep your appointments with your specialists as you may have planned  Please go to the LAB in the Basement (turn left off the elevator) for the tests to be done today  You will be contacted by phone if any changes need to be made immediately.  Otherwise, you will receive a letter about your results with an explanation, but please check with MyChart first.  Please remember to sign up for MyChart if you have not done so, as this will be important to you in the future with finding out test results, communicating by private email, and scheduling acute appointments online when needed.  Please return in 6 months, or sooner if needed

## 2018-03-26 NOTE — Progress Notes (Signed)
Subjective:    Patient ID: [name] [name], male    DOB: [birth year], 67 y.o.   MRN: [id number]  HPI  Here for yearly f/u;  Overall doing ok;  Pt denies Chest pain, worsening SOB, DOE, wheezing, orthopnea, PND, worsening LE edema, palpitations, dizziness or syncope.  Pt denies neurological change such as new headache, facial or extremity weakness.  Pt denies polydipsia, polyuria, or low sugar symptoms. Pt states overall good compliance with treatment and medications, good tolerability, and has been trying to follow appropriate diet.  Pt denies worsening depressive symptoms, suicidal ideation or panic. No fever, night sweats, wt loss, loss of appetite, or other constitutional symptoms.  Pt states good ability with ADL's, has low fall risk, home safety reviewed and adequate, no other significant changes in hearing or vision, and only occasionally active with exercise.  BP at home 147/90's often at home.  Sees urology for prostate ca.  Takes sildenafil prn ED, but incidentally has less nocturia 6-7 times per night.Also has a tender hard knot to the right lower parasternal area, mild persistent x 1 mo, pleuritic, but on assoc with sob, diaphoresis, radation, n/v, palp or dizziness Past Medical History:  Diagnosis Date  . BENIGN PROSTATIC HYPERTROPHY 05/06/2010  . COMMON MIGRAINE 07/31/2007  . GERD 07/31/2007  . HYPERLIPIDEMIA 01/04/2007  . HYPERTENSION 01/04/2007  . OVERACTIVE BLADDER 03/06/2007  . Prostate cancer ([facility]) 02/04/15   Past Surgical History:  Procedure Laterality Date  . COLONOSCOPY  2009    reports that he has never smoked. He has never used smokeless tobacco. He reports that he does not drink alcohol or use drugs. family history includes Heart disease (age of onset: 37) in his brother; Lung cancer (age of onset: 57) in his father. Allergies  Allergen Reactions  . Celebrex [Celecoxib] Other (See Comments)    Stroke like symptoms  . Crestor [Rosuvastatin]     weakness  . Detrol  [Tolterodine] Other (See Comments)    headache  . Ditropan [Oxybutynin] Other (See Comments)    Dry mouth  . Norvasc [Amlodipine Besylate] Swelling   Current Outpatient Medications on File Prior to Visit  Medication Sig Dispense Refill  . aspirin 81 MG tablet Take 81 mg by mouth daily.     Marland Kitchen atorvastatin (LIPITOR) 10 MG tablet TAKE 1 TABLET BY MOUTH EVERY DAY 90 tablet 1  . latanoprost (XALATAN) 0.005 % ophthalmic solution Place 1 drop into the right eye daily.  1  . polyethylene glycol (MIRALAX / GLYCOLAX) packet Take 17 g by mouth as needed.     . sildenafil (REVATIO) 20 MG tablet TAKE 3 TABLETS BY MOUTH EVERY DAY AS NEEDED 30 tablet 5  . Tamsulosin HCl (FLOMAX) 0.4 MG CAPS Take 1 capsule (0.4 mg total) by mouth daily. 30 capsule 11   No current facility-administered medications on file prior to visit.    Review of Systems Constitutional: Negative for other unusual diaphoresis, sweats, appetite or weight changes HENT: Negative for other worsening hearing loss, ear pain, facial swelling, mouth sores or neck stiffness.   Eyes: Negative for other worsening pain, redness or other visual disturbance.  Respiratory: Negative for other stridor or swelling Cardiovascular: Negative for other palpitations or other chest pain  Gastrointestinal: Negative for worsening diarrhea or loose stools, blood in stool, distention or other pain Genitourinary: Negative for hematuria, flank pain or other change in urine volume.  Musculoskeletal: Negative for myalgias or other joint swelling.  Skin: Negative for other color change, or other  wound or worsening drainage.  Neurological: Negative for other syncope or numbness. Hematological: Negative for other adenopathy or swelling Psychiatric/Behavioral: Negative for hallucinations, other worsening agitation, SI, self-injury, or new decreased concentration All other system neg per pt    Objective:   Physical Exam BP 136/88   Pulse 73   Temp 97.7 F (36.5  C) (Oral)   Ht 5\' 7"  (1.702 m)   Wt 197 lb (89.4 kg)   SpO2 97%   BMI 30.85 kg/m  /VS noted,  Constitutional: Pt is oriented to person, place, and time. Appears well-developed and well-nourished, in no significant distress and comfortable Head: Normocephalic and atraumatic  Eyes: Conjunctivae and EOM are normal. Pupils are equal, round, and reactive to light Right Ear: External ear normal without discharge Left Ear: External ear normal without discharge Nose: Nose without discharge or deformity Mouth/Throat: Oropharynx is without other ulcerations and moist  Neck: Normal range of motion. Neck supple. No JVD present. No tracheal deviation present or significant neck LA or mass Cardiovascular: Normal rate, regular rhythm, normal heart sounds and intact distal pulses.   Pulmonary/Chest: WOB normal and breath sounds without rales or wheezing  Abdominal: Soft. Bowel sounds are normal. NT. No HSM  Musculoskeletal: Normal range of motion. Exhibits no edema, does have tender bony knot to the immediate right lower parasternal area about T3, without overlying skin change Lymphadenopathy: Has no other cervical adenopathy.  Neurological: Pt is alert and oriented to person, place, and time. Pt has normal reflexes. No cranial nerve deficit. Motor grossly intact, Gait intact Skin: Skin is warm and dry. No rash noted or new ulcerations Psychiatric:  Has normal mood and affect. Behavior is normal without agitation No other exam findings  Lab Results  Component Value Date   WBC 7.5 03/22/2017   HGB 14.7 03/22/2017   HCT 43.5 03/22/2017   PLT 288.0 03/22/2017   GLUCOSE 80 10/02/2017   CHOL 185 03/22/2017   TRIG 83.0 03/22/2017   HDL 65.90 03/22/2017   LDLDIRECT 139.2 10/29/2012   LDLCALC 103 (H) 03/22/2017   ALT 23 10/02/2017   AST 23 10/02/2017   NA 144 10/02/2017   K 4.2 10/02/2017   CL 103 10/02/2017   CREATININE 1.13 10/02/2017   BUN 15 10/02/2017   CO2 23 10/02/2017   TSH 1.37  03/22/2017   PSA 0.60 03/22/2017   INR 1.0 06/24/2007   HGBA1C  06/24/2007    5.9 (NOTE)   The ADA recommends the following therapeutic goals for glycemic   control related to Hgb A1C measurement:   Goal of Therapy:   < 7.0% Hgb A1C   Action Suggested:  > 8.0% Hgb A1C   Ref:  Diabetes Care, 22, Suppl. 1, 1999        Assessment & Plan:

## 2018-03-26 NOTE — Assessment & Plan Note (Signed)
C/w msk rib/sternal arthritis/costochondritis, ok to follow

## 2018-03-26 NOTE — Assessment & Plan Note (Signed)
With incidental transient improvement with low dose generic viagra, ok to start cialis 5 mg daily

## 2018-03-26 NOTE — Assessment & Plan Note (Signed)
Ok to incresae the benazepril to 20 mg per day, f/u BP at home and next visit

## 2018-04-01 ENCOUNTER — Encounter: Payer: Self-pay | Admitting: Internal Medicine

## 2018-04-01 MED ORDER — BENAZEPRIL HCL 40 MG PO TABS: 40.0000 mg | ORAL_TABLET | Freq: Every day | ORAL | 3 refills | Status: DC

## 2018-04-03 ENCOUNTER — Encounter: Payer: Self-pay | Admitting: Internal Medicine

## 2018-04-11 DIAGNOSIS — C61 Malignant neoplasm of prostate: Secondary | ICD-10-CM | POA: Diagnosis not present

## 2018-04-15 DIAGNOSIS — H0102A Squamous blepharitis right eye, upper and lower eyelids: Secondary | ICD-10-CM | POA: Diagnosis not present

## 2018-04-15 DIAGNOSIS — H401131 Primary open-angle glaucoma, bilateral, mild stage: Secondary | ICD-10-CM | POA: Diagnosis not present

## 2018-04-15 DIAGNOSIS — H47233 Glaucomatous optic atrophy, bilateral: Secondary | ICD-10-CM | POA: Diagnosis not present

## 2018-04-15 DIAGNOSIS — H5202 Hypermetropia, left eye: Secondary | ICD-10-CM | POA: Diagnosis not present

## 2018-04-15 DIAGNOSIS — H11153 Pinguecula, bilateral: Secondary | ICD-10-CM | POA: Diagnosis not present

## 2018-04-15 DIAGNOSIS — H0102B Squamous blepharitis left eye, upper and lower eyelids: Secondary | ICD-10-CM | POA: Diagnosis not present

## 2018-04-15 DIAGNOSIS — H18413 Arcus senilis, bilateral: Secondary | ICD-10-CM | POA: Diagnosis not present

## 2018-04-15 DIAGNOSIS — H35373 Puckering of macula, bilateral: Secondary | ICD-10-CM | POA: Diagnosis not present

## 2018-04-15 DIAGNOSIS — H52222 Regular astigmatism, left eye: Secondary | ICD-10-CM | POA: Diagnosis not present

## 2018-04-15 DIAGNOSIS — Z961 Presence of intraocular lens: Secondary | ICD-10-CM | POA: Diagnosis not present

## 2018-04-18 DIAGNOSIS — C61 Malignant neoplasm of prostate: Secondary | ICD-10-CM | POA: Diagnosis not present

## 2018-04-18 DIAGNOSIS — R351 Nocturia: Secondary | ICD-10-CM | POA: Diagnosis not present

## 2018-04-18 DIAGNOSIS — N5201 Erectile dysfunction due to arterial insufficiency: Secondary | ICD-10-CM | POA: Diagnosis not present

## 2018-04-18 DIAGNOSIS — N401 Enlarged prostate with lower urinary tract symptoms: Secondary | ICD-10-CM | POA: Diagnosis not present

## 2018-04-26 ENCOUNTER — Encounter: Payer: Self-pay | Admitting: Internal Medicine

## 2018-04-26 MED ORDER — HYDROCHLOROTHIAZIDE 12.5 MG PO CAPS: 12.5000 mg | ORAL_CAPSULE | Freq: Every day | ORAL | 3 refills | Status: DC

## 2018-04-26 NOTE — Telephone Encounter (Signed)
shirron to contact pt -   OK, I see you are currently taking the benazepril 40 mg per day, and you have had some difficutly taking amlodipine in the past.  We would like to add a very low dose fluid pill called Hydrochlorothiazide at 12.5 mg per day.  This is normally not expensive and easy to take, and works in a different but complimentary way to the benazepril (so you would take both).  I will send this, and you should hear from the office as well.  Thanks

## 2018-05-11 ENCOUNTER — Other Ambulatory Visit: Payer: Self-pay | Admitting: Internal Medicine

## 2018-05-12 ENCOUNTER — Other Ambulatory Visit: Payer: Self-pay | Admitting: Internal Medicine

## 2018-05-14 ENCOUNTER — Ambulatory Visit (INDEPENDENT_AMBULATORY_CARE_PROVIDER_SITE_OTHER)
Admission: RE | Admit: 2018-05-14 | Discharge: 2018-05-14 | Disposition: A | Discharge status: Home / Self Care | Payer: Medicare Other | Source: Ambulatory Visit | Attending: Internal Medicine | Admitting: Internal Medicine

## 2018-05-14 ENCOUNTER — Ambulatory Visit (INDEPENDENT_AMBULATORY_CARE_PROVIDER_SITE_OTHER): Payer: Medicare Other | Admitting: Internal Medicine

## 2018-05-14 ENCOUNTER — Other Ambulatory Visit (INDEPENDENT_AMBULATORY_CARE_PROVIDER_SITE_OTHER): Payer: Medicare Other

## 2018-05-14 ENCOUNTER — Encounter: Payer: Self-pay | Admitting: Internal Medicine

## 2018-05-14 VITALS — BP 128/78 | HR 55 | Temp 97.7°F | Resp 16 | Ht 67.0 in | Wt 207.0 lb

## 2018-05-14 DIAGNOSIS — M47814 Spondylosis without myelopathy or radiculopathy, thoracic region: Secondary | ICD-10-CM | POA: Diagnosis not present

## 2018-05-14 DIAGNOSIS — M549 Dorsalgia, unspecified: Secondary | ICD-10-CM

## 2018-05-14 LAB — COMPREHENSIVE METABOLIC PANEL
ALK PHOS: 59 U/L (ref 39–117)
ALT: 16 U/L (ref 0–53)
AST: 15 U/L (ref 0–37)
Albumin: 4.4 g/dL (ref 3.5–5.2)
BUN: 14 mg/dL (ref 6–23)
CO2: 31 mEq/L (ref 19–32)
Calcium: 9.6 mg/dL (ref 8.4–10.5)
Chloride: 101 mEq/L (ref 96–112)
Creatinine, Ser: 1.08 mg/dL (ref 0.40–1.50)
GFR: 86.93 mL/min (ref >60.00)
GLUCOSE: 83 mg/dL (ref 70–99)
Potassium: 3.5 mEq/L (ref 3.5–5.1)
Sodium: 140 mEq/L (ref 135–145)
TOTAL PROTEIN: 7.6 g/dL (ref 6.0–8.3)
Total Bilirubin: 0.7 mg/dL (ref 0.2–1.2)

## 2018-05-14 LAB — URINALYSIS, ROUTINE W REFLEX MICROSCOPIC
Bilirubin Urine: NEGATIVE
Ketones, ur: NEGATIVE
Leukocytes, UA: NEGATIVE
Nitrite: NEGATIVE
Specific Gravity, Urine: 1.01 (ref 1.000–1.030)
Total Protein, Urine: NEGATIVE
Urine Glucose: NEGATIVE
Urobilinogen, UA: 0.2 (ref 0.0–1.0)
pH: 7 (ref 5.0–8.0)

## 2018-05-14 LAB — CBC WITH DIFFERENTIAL/PLATELET
Basophils Absolute: 0.1 10*3/uL (ref 0.0–0.1)
Basophils Relative: 0.7 % (ref 0.0–3.0)
Eosinophils Absolute: 0.1 10*3/uL (ref 0.0–0.7)
Eosinophils Relative: 0.9 % (ref 0.0–5.0)
HCT: 44.1 % (ref 39.0–52.0)
Hemoglobin: 14.9 g/dL (ref 13.0–17.0)
Lymphocytes Relative: 26.1 % (ref 12.0–46.0)
Lymphs Abs: 2 10*3/uL (ref 0.7–4.0)
MCHC: 33.8 g/dL (ref 30.0–36.0)
MCV: 92.5 fl (ref 78.0–100.0)
Monocytes Absolute: 0.7 10*3/uL (ref 0.1–1.0)
Monocytes Relative: 9.4 % (ref 3.0–12.0)
Neutro Abs: 4.8 10*3/uL (ref 1.4–7.7)
Neutrophils Relative %: 62.9 % (ref 43.0–77.0)
Platelets: 269 10*3/uL (ref 150.0–400.0)
RBC: 4.77 Mil/uL (ref 4.22–5.81)
RDW: 14 % (ref 11.5–15.5)
WBC: 7.7 10*3/uL (ref 4.0–10.5)

## 2018-05-14 NOTE — Patient Instructions (Signed)
Have blood work and xrays done -   Tests ordered today. Your results will be released to [facility] (or called to you) after review, usually within 72hours after test completion. If any changes need to be made, you will be notified at that same time.   Medications reviewed and updated.  Changes include :   Take advil for the pain.   We will call you with the results.  Call if no improvement

## 2018-05-14 NOTE — Assessment & Plan Note (Signed)
Pain in the right CVA area for 1 week-no cause, radiates around right side Nothing makes it better or worse, but can feel it more with deep breaths, dull pain Advil helps minimally No other symptoms-no cold symptoms, urinary symptoms, rash He states it feels inside and not musculoskeletal Not sure what is causing his pain Will check rib and chest x-ray, thoracic spine x-ray Will check CBC, CMP and urinalysis He deferred any stronger pain medication-continue Advil as needed We will call him with results

## 2018-05-14 NOTE — Progress Notes (Signed)
Subjective:    Patient ID: [name] [name], male    DOB: 1951-02-20, 68 y.o.   MRN: [id number]  HPI The patient is here for an acute visit.  Middle-right back pain x 1 week.  He started having back pain for no reason.  He denies any new activities, falls or injuries that may have caused it.  He states the pain is focused in the right middle back/CVA area and intermittently radiates around his right side.  Occasionally feels it in the anterior right chest along his sternum, but states that may be arthritis.  He feels the pain when he takes deep breaths and reminds him of when he had bronchitis.  He does not think that taking deep breaths makes it worse.  Nothing makes it worse.  He has been taking Advil that may help a little.  The pain is constant dull pain and he rates the pain is 7/10 in intensity.  He denies other symptoms.  Medications and allergies reviewed with patient and updated if appropriate.  Patient Active Problem List   Diagnosis Date Noted  . Hyperglycemia 03/26/2018  . Low back pain 04/19/2017  . Lightheadedness 10/26/2016  . Eustachian tube dysfunction, right 08/17/2016  . Allergic rhinitis 08/17/2016  . Erectile dysfunction 08/17/2016  . Diastolic dysfunction 62/83/1517  . Syncope 04/04/2016  . Weakness 03/26/2015  . Malignant neoplasm of prostate ([facility]) 02/23/2015  . Chest pain 10/01/2013  . Cough 08/04/2013  . Asthmatic bronchitis 07/15/2013  . Microhematuria 05/09/2012  . Constipation 08/13/2011  . Ventral hernia 08/13/2011  . Umbilical hernia 61/60/7371  . Preventative health care 04/25/2011  . Nocturia 06/29/2010  . BENIGN PROSTATIC HYPERTROPHY 05/06/2010  . BACK PAIN 05/06/2010  . SKIN LESION 11/30/2008  . FREQUENCY, URINARY 11/30/2008  . COMMON MIGRAINE 07/31/2007  . GERD 07/31/2007  . OVERACTIVE BLADDER 03/06/2007  . Hypercholesterolemia 01/04/2007  . Hypertensive disorder 01/04/2007    Current Outpatient Medications on File Prior to Visit    Medication Sig Dispense Refill  . aspirin 81 MG tablet Take 81 mg by mouth daily.     Marland Kitchen atorvastatin (LIPITOR) 10 MG tablet TAKE 1 TABLET BY MOUTH EVERY DAY 90 tablet 1  . benazepril (LOTENSIN) 40 MG tablet Take 1 tablet (40 mg total) by mouth daily. 90 tablet 3  . hydrochlorothiazide (MICROZIDE) 12.5 MG capsule Take 1 capsule (12.5 mg total) by mouth daily. 90 capsule 3  . latanoprost (XALATAN) 0.005 % ophthalmic solution Place 1 drop into the right eye daily.  1  . polyethylene glycol (MIRALAX / GLYCOLAX) packet Take 17 g by mouth as needed.     . sildenafil (REVATIO) 20 MG tablet TAKE 3 TABLETS BY MOUTH EVERY DAY AS NEEDED 30 tablet 5  . Tamsulosin HCl (FLOMAX) 0.4 MG CAPS Take 1 capsule (0.4 mg total) by mouth daily. 30 capsule 11   No current facility-administered medications on file prior to visit.     Past Medical History:  Diagnosis Date  . BENIGN PROSTATIC HYPERTROPHY 05/06/2010  . COMMON MIGRAINE 07/31/2007  . GERD 07/31/2007  . HYPERLIPIDEMIA 01/04/2007  . HYPERTENSION 01/04/2007  . OVERACTIVE BLADDER 03/06/2007  . Prostate cancer ([facility]) 02/04/15    Past Surgical History:  Procedure Laterality Date  . COLONOSCOPY  2009    Social History   Socioeconomic History  . Marital status: Married    Spouse name: Not on file  . Number of children: 3  . Years of education: Not on file  . Highest education  level: Not on file  Occupational History  . Not on file  Social Needs  . Financial resource strain: Not hard at all  . Food insecurity:    Worry: Never true    Inability: Never true  . Transportation needs:    Medical: No    Non-medical: No  Tobacco Use  . Smoking status: Never Smoker  . Smokeless tobacco: Never Used  Substance and Sexual Activity  . Alcohol use: No    Alcohol/week: 0.0 standard drinks  . Drug use: No  . Sexual activity: Yes  Lifestyle  . Physical activity:    Days per week: 0 days    Minutes per session: 0 min  . Stress: Not at all   Relationships  . Social connections:    Talks on phone: More than three times a week    Gets together: More than three times a week    Attends religious service: More than 4 times per year    Active member of club or organization: Yes    Attends meetings of clubs or organizations: More than 4 times per year    Relationship status: Married  Other Topics Concern  . Not on file  Social History Narrative  . Not on file    Family History  Problem Relation Age of Onset  . Lung cancer Father 51  . Heart disease Brother 73       died from MI   . Colon cancer Neg Hx   . Esophageal cancer Neg Hx   . Rectal cancer Neg Hx   . Stomach cancer Neg Hx     Review of Systems  Constitutional: Negative for chills and fever.  HENT: Negative for congestion, ear pain and sore throat.   Respiratory: Negative for cough, shortness of breath and wheezing.   Cardiovascular: Negative for chest pain.  Gastrointestinal: Negative for abdominal pain and nausea.  Genitourinary: Negative for dysuria, frequency and hematuria.  Neurological: Positive for light-headedness. Negative for numbness and headaches.       Objective:   Vitals:   05/14/18 1521  BP: 128/78  Pulse: (!) 55  Resp: 16  Temp: 97.7 F (36.5 C)  SpO2: 98%   BP Readings from Last 3 Encounters:  05/14/18 128/78  03/26/18 136/88  12/19/17 (!) 101/57   Wt Readings from Last 3 Encounters:  05/14/18 207 lb (93.9 kg)  03/26/18 197 lb (89.4 kg)  12/19/17 201 lb (91.2 kg)   Body mass index is 32.42 kg/m.   Physical Exam Constitutional:      General: He is not in acute distress.    Appearance: Normal appearance. He is not ill-appearing.  HENT:     Head: Normocephalic and atraumatic.  Cardiovascular:     Rate and Rhythm: Normal rate and regular rhythm.  Pulmonary:     Effort: Pulmonary effort is normal. No respiratory distress.     Breath sounds: Normal breath sounds. No wheezing or rhonchi.  Abdominal:     General: There is  no distension.     Palpations: Abdomen is soft.     Tenderness: There is no abdominal tenderness.  Genitourinary:    Comments: No CVA tenderness Musculoskeletal:        General: No tenderness (No tenderness along thoracic spine, right-sided ribs or anterior chest along right sternum).  Skin:    General: Skin is warm and dry.     Findings: No rash.  Neurological:     Mental Status: He is alert.  Assessment & Plan:    See Problem List for Assessment and Plan of chronic medical problems.

## 2018-05-23 ENCOUNTER — Encounter: Payer: Self-pay | Admitting: Internal Medicine

## 2018-05-23 MED ORDER — METOPROLOL SUCCINATE ER 25 MG PO TB24: 25.0000 mg | ORAL_TABLET | Freq: Every day | ORAL | 3 refills | Status: DC

## 2018-06-07 DIAGNOSIS — Z9889 Other specified postprocedural states: Secondary | ICD-10-CM | POA: Diagnosis not present

## 2018-06-07 DIAGNOSIS — Z961 Presence of intraocular lens: Secondary | ICD-10-CM | POA: Diagnosis not present

## 2018-06-07 DIAGNOSIS — H401131 Primary open-angle glaucoma, bilateral, mild stage: Secondary | ICD-10-CM | POA: Diagnosis not present

## 2018-06-12 ENCOUNTER — Ambulatory Visit (INDEPENDENT_AMBULATORY_CARE_PROVIDER_SITE_OTHER)
Admission: RE | Admit: 2018-06-12 | Discharge: 2018-06-12 | Disposition: A | Discharge status: Home / Self Care | Payer: Medicare Other | Source: Ambulatory Visit | Attending: Internal Medicine | Admitting: Internal Medicine

## 2018-06-12 ENCOUNTER — Encounter: Payer: Self-pay | Admitting: Internal Medicine

## 2018-06-12 ENCOUNTER — Ambulatory Visit (INDEPENDENT_AMBULATORY_CARE_PROVIDER_SITE_OTHER): Payer: Medicare Other | Admitting: Internal Medicine

## 2018-06-12 VITALS — BP 140/88 | HR 74 | Temp 97.8°F | Ht 67.0 in | Wt 204.0 lb

## 2018-06-12 DIAGNOSIS — R059 Cough, unspecified: Secondary | ICD-10-CM

## 2018-06-12 DIAGNOSIS — N529 Male erectile dysfunction, unspecified: Secondary | ICD-10-CM | POA: Diagnosis not present

## 2018-06-12 DIAGNOSIS — R0602 Shortness of breath: Secondary | ICD-10-CM | POA: Diagnosis not present

## 2018-06-12 DIAGNOSIS — Z23 Encounter for immunization: Secondary | ICD-10-CM | POA: Diagnosis not present

## 2018-06-12 DIAGNOSIS — R062 Wheezing: Secondary | ICD-10-CM | POA: Diagnosis not present

## 2018-06-12 DIAGNOSIS — R05 Cough: Secondary | ICD-10-CM

## 2018-06-12 DIAGNOSIS — J4521 Mild intermittent asthma with (acute) exacerbation: Secondary | ICD-10-CM | POA: Diagnosis not present

## 2018-06-12 DIAGNOSIS — I10 Essential (primary) hypertension: Secondary | ICD-10-CM

## 2018-06-12 MED ORDER — AMLODIPINE BESYLATE 10 MG PO TABS: 10.0000 mg | ORAL_TABLET | Freq: Every day | ORAL | 3 refills | Status: DC

## 2018-06-12 MED ORDER — BENAZEPRIL HCL 20 MG PO TABS: 20.0000 mg | ORAL_TABLET | Freq: Every day | ORAL | 3 refills | Status: DC

## 2018-06-12 MED ORDER — PREDNISONE 10 MG PO TABS: ORAL_TABLET | ORAL | 0 refills | Status: DC

## 2018-06-12 MED ORDER — AZITHROMYCIN 250 MG PO TABS: ORAL_TABLET | ORAL | 1 refills | Status: DC

## 2018-06-12 MED ORDER — SILDENAFIL CITRATE 20 MG PO TABS: ORAL_TABLET | ORAL | 11 refills | Status: DC

## 2018-06-12 NOTE — Assessment & Plan Note (Signed)
Mild to mod, c/w URI, for antibx course,  to f/u any worsening symptoms or concerns

## 2018-06-12 NOTE — Assessment & Plan Note (Signed)
Ok for cialis prn,  to f/u any worsening symptoms or concerns  

## 2018-06-12 NOTE — Assessment & Plan Note (Signed)
stable overall by history and exam, recent data reviewed with pt, and pt to continue medical treatment as before,  to f/u any worsening symptoms or concerns  

## 2018-06-12 NOTE — Assessment & Plan Note (Signed)
/  Mild to mod, for predpac asd,  to f/u any worsening symptoms or concerns 

## 2018-06-12 NOTE — Progress Notes (Signed)
Subjective:    Patient ID: [name] [name], male    DOB: [birth year], 68 y.o.   MRN: [id number]  HPI  Here with somewhat vague symptoms of over 1 mo intermittent cough, colored congestion, wheezing and sob/doe, most recently now persistent constant in the past 1 wk.  Feels warm, not sure about fever.  Pt denies chest pain, increased sob or doe, wheezing, orthopnea, PND, increased LE swelling, palpitations, dizziness or syncope.  Pt denies new neurological symptoms such as new headache, or facial or extremity weakness or numbness   Pt denies polydipsia, polyuria.  Also asks for cialis refill, with ongoing persistent ED symptoms Past Medical History:  Diagnosis Date  . BENIGN PROSTATIC HYPERTROPHY 05/06/2010  . COMMON MIGRAINE 07/31/2007  . GERD 07/31/2007  . HYPERLIPIDEMIA 01/04/2007  . HYPERTENSION 01/04/2007  . OVERACTIVE BLADDER 03/06/2007  . Prostate cancer ([place]) 02/04/15   Past Surgical History:  Procedure Laterality Date  . COLONOSCOPY  2009    reports that he has never smoked. He has never used smokeless tobacco. He reports that he does not drink alcohol or use drugs. family history includes Heart disease (age of onset: 86) in his brother; Lung cancer (age of onset: 61) in his father. Allergies  Allergen Reactions  . Celebrex [Celecoxib] Other (See Comments)    Stroke like symptoms  . Crestor [Rosuvastatin]     weakness  . Detrol [Tolterodine] Other (See Comments)    headache  . Ditropan [Oxybutynin] Other (See Comments)    Dry mouth  . Norvasc [Amlodipine Besylate] Swelling  . Tadalafil Other (See Comments)    headache   Current Outpatient Medications on File Prior to Visit  Medication Sig Dispense Refill  . aspirin 81 MG tablet Take 81 mg by mouth daily.     Marland Kitchen atorvastatin (LIPITOR) 10 MG tablet TAKE 1 TABLET BY MOUTH EVERY DAY 90 tablet 1  . hydrochlorothiazide (MICROZIDE) 12.5 MG capsule Take 1 capsule (12.5 mg total) by mouth daily. 90 capsule 3  . latanoprost (XALATAN)  0.005 % ophthalmic solution Place 1 drop into the right eye daily.  1  . metoprolol succinate (TOPROL-XL) 25 MG 24 hr tablet Take 1 tablet (25 mg total) by mouth daily. 90 tablet 3  . polyethylene glycol (MIRALAX / GLYCOLAX) packet Take 17 g by mouth as needed.     . Tamsulosin HCl (FLOMAX) 0.4 MG CAPS Take 1 capsule (0.4 mg total) by mouth daily. 30 capsule 11   No current facility-administered medications on file prior to visit.    Review of Systems  Constitutional: Negative for other unusual diaphoresis or sweats HENT: Negative for ear discharge or swelling Eyes: Negative for other worsening visual disturbances Respiratory: Negative for stridor or other swelling  Gastrointestinal: Negative for worsening distension or other blood Genitourinary: Negative for retention or other urinary change Musculoskeletal: Negative for other MSK pain or swelling Skin: Negative for color change or other new lesions Neurological: Negative for worsening tremors and other numbness  Psychiatric/Behavioral: Negative for worsening agitation or other fatigue All other system neg per pt    Objective:   Physical Exam BP 140/88   Pulse 74   Temp 97.8 F (36.6 C) (Oral)   Ht 5\' 7"  (1.702 m)   Wt 204 lb (92.5 kg)   SpO2 97%   BMI 31.95 kg/m  VS noted, mild ill Constitutional: Pt appears in NAD HENT: Head: NCAT.  Right Ear: External ear normal.  Left Ear: External ear normal.  Eyes: . Pupils  are equal, round, and reactive to light. Conjunctivae and EOM are normal Nose: without d/c or deformity /Bilat tm's with mild erythema.  Max sinus areas mild tender.  Pharynx with mild erythema, no exudate Neck: Neck supple. Gross normal ROM Cardiovascular: Normal rate and regular rhythm.   Pulmonary/Chest: Effort normal and breath sounds without rales or wheezing.  Neurological: Pt is alert. At baseline orientation, motor grossly intact Skin: Skin is warm. No rashes, other new lesions, no LE edema Psychiatric:  Pt behavior is normal without agitation  No other exam findings Lab Results  Component Value Date   WBC 7.7 05/14/2018   HGB 14.9 05/14/2018   HCT 44.1 05/14/2018   PLT 269.0 05/14/2018   GLUCOSE 83 05/14/2018   CHOL 182 03/26/2018   TRIG 65.0 03/26/2018   HDL 61.20 03/26/2018   LDLDIRECT 139.2 10/29/2012   LDLCALC 108 (H) 03/26/2018   ALT 16 05/14/2018   AST 15 05/14/2018   NA 140 05/14/2018   K 3.5 05/14/2018   CL 101 05/14/2018   CREATININE 1.08 05/14/2018   BUN 14 05/14/2018   CO2 31 05/14/2018   TSH 1.39 03/26/2018   PSA 0.30 03/26/2018   INR 1.0 06/24/2007   HGBA1C 6.0 03/26/2018       Assessment & Plan:

## 2018-06-12 NOTE — Patient Instructions (Signed)
Please take all new medication as prescribed - the antibiotic, and prednisone  You can also take Delsym OTC for cough, and/or Mucinex (or it's generic off brand) for congestion, and tylenol as needed for pain.  Please continue all other medications as before, and refills have been done if requested.  Please have the pharmacy call with any other refills you may need.  Please continue your efforts at being more active, low cholesterol diet, and weight control.  You are otherwise up to date with prevention measures today.  Please keep your appointments with your specialists as you may have planned  Please go to the XRAY Department in the Basement (go straight as you get off the elevator) for the x-ray testing  You will be contacted by phone if any changes need to be made immediately.  Otherwise, you will receive a letter about your results with an explanation, but please check with MyChart first.  Please remember to sign up for MyChart if you have not done so, as this will be important to you in the future with finding out test results, communicating by private email, and scheduling acute appointments online when needed.

## 2018-06-17 DIAGNOSIS — H401131 Primary open-angle glaucoma, bilateral, mild stage: Secondary | ICD-10-CM | POA: Diagnosis not present

## 2018-07-22 ENCOUNTER — Telehealth: Payer: Self-pay

## 2018-07-22 NOTE — Telephone Encounter (Signed)
Denied per insurance. Treatment for ED is not covered for this med under patient's part d coverage.    Determination sent to scan

## 2018-07-22 NOTE — Telephone Encounter (Signed)
Key: [id number]

## 2018-08-01 ENCOUNTER — Telehealth: Payer: Self-pay | Admitting: *Deleted

## 2018-08-01 NOTE — Telephone Encounter (Signed)
Called patient and LVM to inform them the nurse needs to either convert their upcoming AWV to a virtual visit or reschedule the visit out into the future due to covid-19 safety measures. Nurse rescheduled for 09/25/18.

## 2018-08-15 ENCOUNTER — Ambulatory Visit: Payer: Medicare Other

## 2018-09-19 DIAGNOSIS — H16223 Keratoconjunctivitis sicca, not specified as Sjogren's, bilateral: Secondary | ICD-10-CM | POA: Diagnosis not present

## 2018-09-19 DIAGNOSIS — H0102B Squamous blepharitis left eye, upper and lower eyelids: Secondary | ICD-10-CM | POA: Diagnosis not present

## 2018-09-19 DIAGNOSIS — H35373 Puckering of macula, bilateral: Secondary | ICD-10-CM | POA: Diagnosis not present

## 2018-09-19 DIAGNOSIS — H18413 Arcus senilis, bilateral: Secondary | ICD-10-CM | POA: Diagnosis not present

## 2018-09-19 DIAGNOSIS — H04123 Dry eye syndrome of bilateral lacrimal glands: Secondary | ICD-10-CM | POA: Diagnosis not present

## 2018-09-19 DIAGNOSIS — H401131 Primary open-angle glaucoma, bilateral, mild stage: Secondary | ICD-10-CM | POA: Diagnosis not present

## 2018-09-19 DIAGNOSIS — H11153 Pinguecula, bilateral: Secondary | ICD-10-CM | POA: Diagnosis not present

## 2018-09-19 DIAGNOSIS — H0102A Squamous blepharitis right eye, upper and lower eyelids: Secondary | ICD-10-CM | POA: Diagnosis not present

## 2018-09-24 NOTE — Progress Notes (Addendum)
Subjective:   [name] [name] is a 68 y.o. male who presents for Medicare Annual/Subsequent preventive examination.  Review of Systems:  No ROS.  Medicare Wellness Virtual Visit.  Visual/audio telehealth visit, UTA vital signs.   See social history for additional risk factors.  Cardiac Risk Factors include: advanced age (>33men, >26 women);dyslipidemia;male gender;hypertension Sleep patterns: has frequent nighttime awakenings, gets up 3 times nightly to void and sleeps 6-7 hours nightly.    Home Safety/Smoke Alarms: Feels safe in home. Smoke alarms in place.  Living environment; residence and Firearm Safety: 1-story house/ trailer. Lives with wife, no needs for DME, good support system Seat Belt Safety/Bike Helmet: Wears seat belt.   PSA-  Lab Results  Component Value Date   PSA 0.30 03/26/2018   PSA 0.60 03/22/2017   PSA 0.81 04/04/2016       Objective:    Vitals: BP (!) 168/100   Pulse 74   Ht 5\' 7"  (1.702 m)   Wt 207 lb (93.9 kg)   SpO2 98%   BMI 32.42 kg/m   Body mass index is 32.42 kg/m.  Advanced Directives 09/25/2018 08/13/2017 08/11/2016 08/05/2015 02/23/2015  Does Patient Have a Medical Advance Directive? No No No No No  Does patient want to make changes to medical advance directive? - Yes (ED - Information included in AVS) - - -  Would patient like information on creating a medical advance directive? Yes (ED - Information included in AVS) - Yes (ED - Information included in AVS) Yes - Educational materials given No - patient declined information    Tobacco Social History   Tobacco Use  Smoking Status Never Smoker  Smokeless Tobacco Never Used     Counseling given: Not Answered  Past Medical History:  Diagnosis Date  . BENIGN PROSTATIC HYPERTROPHY 05/06/2010  . COMMON MIGRAINE 07/31/2007  . GERD 07/31/2007  . HYPERLIPIDEMIA 01/04/2007  . HYPERTENSION 01/04/2007  . OVERACTIVE BLADDER 03/06/2007  . Prostate cancer ([place]) 02/04/15   Past Surgical History:   Procedure Laterality Date  . COLONOSCOPY  2009   Family History  Problem Relation Age of Onset  . Lung cancer Father 25  . Heart disease Brother 29       died from MI   . Colon cancer Neg Hx   . Esophageal cancer Neg Hx   . Rectal cancer Neg Hx   . Stomach cancer Neg Hx    Social History   Socioeconomic History  . Marital status: Married    Spouse name: Not on file  . Number of children: 3  . Years of education: Not on file  . Highest education level: Not on file  Occupational History  . Occupation: Theme park manager  Social Needs  . Financial resource strain: Not hard at all  . Food insecurity:    Worry: Never true    Inability: Never true  . Transportation needs:    Medical: No    Non-medical: No  Tobacco Use  . Smoking status: Never Smoker  . Smokeless tobacco: Never Used  Substance and Sexual Activity  . Alcohol use: No    Alcohol/week: 0.0 standard drinks  . Drug use: No  . Sexual activity: Yes  Lifestyle  . Physical activity:    Days per week: 0 days    Minutes per session: 0 min  . Stress: Not at all  Relationships  . Social connections:    Talks on phone: More than three times a week    Gets together: More  than three times a week    Attends religious service: More than 4 times per year    Active member of club or organization: Yes    Attends meetings of clubs or organizations: More than 4 times per year    Relationship status: Married  Other Topics Concern  . Not on file  Social History Narrative  . Not on file    Outpatient Encounter Medications as of 09/25/2018  Medication Sig  . aspirin 81 MG tablet Take 81 mg by mouth daily.   Marland Kitchen atorvastatin (LIPITOR) 20 MG tablet Take 1 tablet (20 mg total) by mouth daily.  . benazepril (LOTENSIN) 40 MG tablet Take 1 tablet (40 mg total) by mouth daily.  . clonazePAM (KLONOPIN) 0.5 MG tablet Take 1 tablet (0.5 mg total) by mouth 2 (two) times daily as needed for anxiety.  . hydrochlorothiazide (MICROZIDE) 12.5 MG  capsule Take 1 capsule (12.5 mg total) by mouth daily.  Marland Kitchen latanoprost (XALATAN) 0.005 % ophthalmic solution Place 1 drop into the right eye daily.  . metoprolol succinate (TOPROL-XL) 25 MG 24 hr tablet Take 1 tablet (25 mg total) by mouth daily.  . polyethylene glycol (MIRALAX / GLYCOLAX) packet Take 17 g by mouth as needed.   . sildenafil (REVATIO) 20 MG tablet TAKE 3 TABLETS BY MOUTH EVERY DAY AS NEEDED  . Tamsulosin HCl (FLOMAX) 0.4 MG CAPS Take 1 capsule (0.4 mg total) by mouth daily.  . [DISCONTINUED] amLODipine (NORVASC) 10 MG tablet Take 1 tablet (10 mg total) by mouth daily.  . [DISCONTINUED] atorvastatin (LIPITOR) 10 MG tablet TAKE 1 TABLET BY MOUTH EVERY DAY  . [DISCONTINUED] azithromycin (ZITHROMAX Z-PAK) 250 MG tablet 2 tab by mouth day 1, then 1 per day  . [DISCONTINUED] benazepril (LOTENSIN) 20 MG tablet Take 1 tablet (20 mg total) by mouth daily.  . [DISCONTINUED] predniSONE (DELTASONE) 10 MG tablet 3 tabs by mouth per day for 3 days,2tabs per day for 3 days,1tab per day for 3 days   No facility-administered encounter medications on file as of 09/25/2018.     Activities of Daily Living In your present state of health, do you have any difficulty performing the following activities: 09/25/2018  Hearing? N  Vision? N  Difficulty concentrating or making decisions? N  Walking or climbing stairs? N  Dressing or bathing? N  Doing errands, shopping? N  Preparing Food and eating ? N  Using the Toilet? N  In the past six months, have you accidently leaked urine? N  Do you have problems with loss of bowel control? N  Managing your Medications? N  Managing your Finances? N  Housekeeping or managing your Housekeeping? N  Some recent data might be hidden    Patient Care Team: Biagio Borg, MD as PCP - Gwenevere Abbot, Docia Chuck, MD as Consulting Physician (Gastroenterology) Trula Slade, DPM as Consulting Physician (Podiatry)   Assessment:   This is a routine wellness  examination for [place]. Physical assessment deferred to PCP.  Exercise Activities and Dietary recommendations Current Exercise Habits: The patient does not participate in regular exercise at present, Exercise limited by: None identified  Diet (meal preparation, eat out, water intake, caffeinated beverages, dairy products, fruits and vegetables): in general, a "healthy" diet  , well balanced   Reviewed heart healthy and diabetic diet. Encouraged patient to increase daily water and healthy fluid intake.  Goals    . Patient Stated     Lose weight by monitoring my diet and eat  healthy, drink a glass of water before each meal and use portion control. getting back on my treadmill, enjoy life family, worship God.    . reduce stomach fat     Limit sugar and carbohydrates and do sit-ups       Fall Risk Fall Risk  09/25/2018 06/12/2018 03/26/2018 11/20/2017 10/23/2017  Falls in the past year? 0 0 0 No No  Number falls in past yr: 0 - - - -  Injury with Fall? 0 - - - -    Depression Screen PHQ 2/9 Scores 09/25/2018 06/12/2018 03/26/2018 11/20/2017  PHQ - 2 Score 0 0 0 0  PHQ- 9 Score - - - -    Cognitive Function       Ad8 score reviewed for issues:  Issues making decisions: no  Less interest in hobbies / activities: no  Repeats questions, stories (family complaining): no  Trouble using ordinary gadgets (microwave, computer, phone):no  Forgets the month or year: no  Mismanaging finances: no  Remembering appts: no  Daily problems with thinking and/or memory: no Ad8 score is= 0  Immunization History  Administered Date(s) Administered  . Influenza Split 03/15/2012  . Influenza Whole 03/08/2006, 03/09/2008, 02/05/2009  . Influenza,inj,Quad PF,6+ Mos 02/03/2013, 02/27/2014, 02/23/2015, 02/24/2016, 02/06/2017  . Influenza-Unspecified 02/07/2018  . Pneumococcal Conjugate-13 07/15/2013, 04/08/2014  . Pneumococcal Polysaccharide-23 09/23/2015  . Td 04/20/2008  . Tdap 06/12/2018  .  Zoster 05/06/2010   Screening Tests Health Maintenance  Topic Date Due  . INFLUENZA VACCINE  12/07/2018  . COLONOSCOPY  12/20/2027  . TETANUS/TDAP  06/12/2028  . Hepatitis C Screening  Completed  . PNA vac Low Risk Adult  Completed       Plan:     Reviewed health maintenance screenings with patient today and relevant education, vaccines, and/or referrals were provided.   Continue doing brain stimulating activities (puzzles, reading, adult coloring books, staying active) to keep memory sharp.   Continue to eat heart healthy diet (full of fruits, vegetables, whole grains, lean protein, water--limit salt, fat, and sugar intake) and increase physical activity as tolerated.  I have personally reviewed and noted the following in the patient's chart:   . Medical and social history . Use of alcohol, tobacco or illicit drugs  . Current medications and supplements . Functional ability and status . Nutritional status . Physical activity . Advanced directives . List of other physicians . Vitals . Screenings to include cognitive, depression, and falls . Referrals and appointments  In addition, I have reviewed and discussed with patient certain preventive protocols, quality metrics, and best practice recommendations. A written personalized care plan for preventive services as well as general preventive health recommendations were provided to patient.     Michiel Cowboy, RN  09/25/2018  Medical screening examination/treatment/procedure(s) were performed by non-physician practitioner and as supervising physician I was immediately available for consultation/collaboration. I agree with above. [name], MD

## 2018-09-25 ENCOUNTER — Telehealth: Payer: Self-pay

## 2018-09-25 ENCOUNTER — Encounter: Payer: Self-pay | Admitting: Internal Medicine

## 2018-09-25 ENCOUNTER — Other Ambulatory Visit: Payer: Self-pay | Admitting: Internal Medicine

## 2018-09-25 ENCOUNTER — Other Ambulatory Visit (INDEPENDENT_AMBULATORY_CARE_PROVIDER_SITE_OTHER): Payer: Medicare Other

## 2018-09-25 ENCOUNTER — Ambulatory Visit (INDEPENDENT_AMBULATORY_CARE_PROVIDER_SITE_OTHER): Payer: Medicare Other | Admitting: Internal Medicine

## 2018-09-25 ENCOUNTER — Other Ambulatory Visit: Payer: Self-pay

## 2018-09-25 ENCOUNTER — Ambulatory Visit (INDEPENDENT_AMBULATORY_CARE_PROVIDER_SITE_OTHER): Payer: Medicare Other | Admitting: *Deleted

## 2018-09-25 VITALS — BP 168/100 | HR 74 | Ht 67.0 in | Wt 207.0 lb

## 2018-09-25 VITALS — BP 168/100 | HR 74 | Temp 97.9°F | Ht 67.0 in | Wt 207.0 lb

## 2018-09-25 DIAGNOSIS — N318 Other neuromuscular dysfunction of bladder: Secondary | ICD-10-CM

## 2018-09-25 DIAGNOSIS — R3129 Other microscopic hematuria: Secondary | ICD-10-CM

## 2018-09-25 DIAGNOSIS — E7800 Pure hypercholesterolemia, unspecified: Secondary | ICD-10-CM

## 2018-09-25 DIAGNOSIS — E559 Vitamin D deficiency, unspecified: Secondary | ICD-10-CM

## 2018-09-25 DIAGNOSIS — F410 Panic disorder [episodic paroxysmal anxiety] without agoraphobia: Secondary | ICD-10-CM

## 2018-09-25 DIAGNOSIS — R739 Hyperglycemia, unspecified: Secondary | ICD-10-CM | POA: Diagnosis not present

## 2018-09-25 DIAGNOSIS — F411 Generalized anxiety disorder: Secondary | ICD-10-CM | POA: Diagnosis not present

## 2018-09-25 DIAGNOSIS — E538 Deficiency of other specified B group vitamins: Secondary | ICD-10-CM | POA: Diagnosis not present

## 2018-09-25 DIAGNOSIS — Z0000 Encounter for general adult medical examination without abnormal findings: Secondary | ICD-10-CM | POA: Diagnosis not present

## 2018-09-25 DIAGNOSIS — I10 Essential (primary) hypertension: Secondary | ICD-10-CM | POA: Diagnosis not present

## 2018-09-25 DIAGNOSIS — E611 Iron deficiency: Secondary | ICD-10-CM | POA: Diagnosis not present

## 2018-09-25 LAB — HEPATIC FUNCTION PANEL
ALT: 25 U/L (ref 0–53)
AST: 21 U/L (ref 0–37)
Albumin: 4.5 g/dL (ref 3.5–5.2)
Alkaline Phosphatase: 68 U/L (ref 39–117)
Bilirubin, Direct: 0.2 mg/dL (ref 0.0–0.3)
Total Bilirubin: 0.6 mg/dL (ref 0.2–1.2)
Total Protein: 8 g/dL (ref 6.0–8.3)

## 2018-09-25 LAB — LIPID PANEL
Cholesterol: 173 mg/dL (ref 0–200)
HDL: 60 mg/dL
LDL Cholesterol: 95 mg/dL (ref 0–99)
NonHDL: 112.99
Total CHOL/HDL Ratio: 3
Triglycerides: 88 mg/dL (ref 0.0–149.0)
VLDL: 17.6 mg/dL (ref 0.0–40.0)

## 2018-09-25 LAB — IBC PANEL
Iron: 55 ug/dL (ref 42–165)
Saturation Ratios: 15.4 % — ABNORMAL LOW (ref 20.0–50.0)
Transferrin: 255 mg/dL (ref 212.0–360.0)

## 2018-09-25 LAB — BASIC METABOLIC PANEL
BUN: 14 mg/dL (ref 6–23)
CO2: 30 mEq/L (ref 19–32)
Calcium: 9.5 mg/dL (ref 8.4–10.5)
Chloride: 101 mEq/L (ref 96–112)
Creatinine, Ser: 1.05 mg/dL (ref 0.40–1.50)
GFR: 84.4 mL/min (ref >60.00)
Glucose, Bld: 117 mg/dL — ABNORMAL HIGH (ref 70–99)
Potassium: 3.5 mEq/L (ref 3.5–5.1)
Sodium: 139 mEq/L (ref 135–145)

## 2018-09-25 LAB — VITAMIN B12: Vitamin B-12: 620 pg/mL (ref 211–911)

## 2018-09-25 LAB — VITAMIN D 25 HYDROXY (VIT D DEFICIENCY, FRACTURES): VITD: 18.95 ng/mL — ABNORMAL LOW (ref 30.00–100.00)

## 2018-09-25 LAB — HEMOGLOBIN A1C: Hgb A1c MFr Bld: 6.2 % (ref 4.6–6.5)

## 2018-09-25 MED ORDER — VITAMIN D (ERGOCALCIFEROL) 1.25 MG (50000 UNIT) PO CAPS: 50000.0000 [IU] | ORAL_CAPSULE | ORAL | 0 refills | Status: DC

## 2018-09-25 MED ORDER — TOLTERODINE TARTRATE ER 4 MG PO CP24: 4.0000 mg | ORAL_CAPSULE | Freq: Every day | ORAL | 3 refills | Status: DC

## 2018-09-25 MED ORDER — BENAZEPRIL HCL 40 MG PO TABS: 40.0000 mg | ORAL_TABLET | Freq: Every day | ORAL | 3 refills | Status: DC

## 2018-09-25 MED ORDER — ATORVASTATIN CALCIUM 20 MG PO TABS: 20.0000 mg | ORAL_TABLET | Freq: Every day | ORAL | 3 refills | Status: DC

## 2018-09-25 MED ORDER — CLONAZEPAM 0.5 MG PO TABS: 0.5000 mg | ORAL_TABLET | Freq: Two times a day (BID) | ORAL | 1 refills | Status: DC | PRN

## 2018-09-25 NOTE — Telephone Encounter (Signed)
-----   Message from Biagio Borg, MD sent at 09/25/2018 12:04 PM EDT ----- Left message on MyChart, pt to cont same tx except  The test results show that your current treatment is OK, except we did find a Vitamin D level which is low.  Vitamin D is important to keep strong bones.  Please take a prescription of Vit D at 50,000 units per wk for 12 weeks only.  After that, please change to taking OTC Vitamin D3 at 2000 units per day.    Clorene Nerio to please inform pt, I will do rx

## 2018-09-25 NOTE — Patient Instructions (Addendum)
Ok to increase the benazepril to 40 mg per day, and increase the lipitor to 20 mg for cholesterol  Please take all new medication as prescribed - the Detrol LA 4 mg per day for bladder, and klonopin for worse stress attacks  Please continue all other medications as before, and refills have been done if requested.  Please have the pharmacy call with any other refills you may need.  Please continue your efforts at being more active, low cholesterol diet, and weight control  Please keep your appointments with your specialists as you may have planned  Please go to the LAB in the Basement (turn left off the elevator) for the tests to be done today  You will be contacted by phone if any changes need to be made immediately.  Otherwise, you will receive a letter about your results with an explanation, but please check with MyChart first.  Please remember to sign up for MyChart if you have not done so, as this will be important to you in the future with finding out test results, communicating by private email, and scheduling acute appointments online when needed.  Please return in 6 months, or sooner if needed

## 2018-09-25 NOTE — Telephone Encounter (Signed)
Pt has been informed of results and expressed understanding.  °

## 2018-09-25 NOTE — Patient Instructions (Addendum)
Continue doing brain stimulating activities (puzzles, reading, adult coloring books, staying active) to keep memory sharp.   Continue to eat heart healthy diet (full of fruits, vegetables, whole grains, lean protein, water--limit salt, fat, and sugar intake) and increase physical activity as tolerated.   [name] [name] , Thank you for taking time to come for your Medicare Wellness Visit. I appreciate your ongoing commitment to your health goals. Please review the following plan we discussed and let me know if I can assist you in the future.   These are the goals we discussed: Goals    . Patient Stated     Lose weight by monitoring my diet and eat healthy, drink a glass of water before each meal and use portion control. getting back on my treadmill, enjoy life family, worship God.    . reduce stomach fat     Limit sugar and carbohydrates and do sit-ups       This is a list of the screening recommended for you and due dates:  Health Maintenance  Topic Date Due  . Flu Shot  12/07/2018  . Colon Cancer Screening  12/20/2027  . Tetanus Vaccine  06/12/2028  .  Hepatitis C: One time screening is recommended by Center for Disease Control  (CDC) for  adults born from [birth year] through 1967.   Completed  . Pneumonia vaccines  Completed    Preventive Care 59 Years and Older, Male Preventive care refers to lifestyle choices and visits with your health care provider that can promote health and wellness. What does preventive care include?   A yearly physical exam. This is also called an annual well check.  Dental exams once or twice a year.  Routine eye exams. Ask your health care provider how often you should have your eyes checked.  Personal lifestyle choices, including: ? Daily care of your teeth and gums. ? Regular physical activity. ? Eating a healthy diet. ? Avoiding tobacco and drug use. ? Limiting alcohol use. ? Practicing safe sex. ? Taking low doses of aspirin every day. ? Taking  vitamin and mineral supplements as recommended by your health care provider. What happens during an annual well check? The services and screenings done by your health care provider during your annual well check will depend on your age, overall health, lifestyle risk factors, and family history of disease. Counseling Your health care provider may ask you questions about your:  Alcohol use.  Tobacco use.  Drug use.  Emotional well-being.  Home and relationship well-being.  Sexual activity.  Eating habits.  History of falls.  Memory and ability to understand (cognition).  Work and work Statistician. Screening You may have the following tests or measurements:  Height, weight, and BMI.  Blood pressure.  Lipid and cholesterol levels. These may be checked every 5 years, or more frequently if you are over 74 years old.  Skin check.  Lung cancer screening. You may have this screening every year starting at age 41 if you have a 30-pack-year history of smoking and currently smoke or have quit within the past 15 years.  Colorectal cancer screening. All adults should have this screening starting at age 30 and continuing until age 57. You will have tests every 1-10 years, depending on your results and the type of screening test. People at increased risk should start screening at an earlier age. Screening tests may include: ? Guaiac-based fecal occult blood testing. ? Fecal immunochemical test (FIT). ? Stool DNA test. ? Virtual colonoscopy. ?  Sigmoidoscopy. During this test, a flexible tube with a tiny camera (sigmoidoscope) is used to examine your rectum and lower colon. The sigmoidoscope is inserted through your anus into your rectum and lower colon. ? Colonoscopy. During this test, a long, thin, flexible tube with a tiny camera (colonoscope) is used to examine your entire colon and rectum.  Prostate cancer screening. Recommendations will vary depending on your family history and  other risks.  Hepatitis C blood test.  Hepatitis B blood test.  Sexually transmitted disease (STD) testing.  Diabetes screening. This is done by checking your blood sugar (glucose) after you have not eaten for a while (fasting). You may have this done every 1-3 years.  Abdominal aortic aneurysm (AAA) screening. You may need this if you are a current or former smoker.  Osteoporosis. You may be screened starting at age 40 if you are at high risk. Talk with your health care provider about your test results, treatment options, and if necessary, the need for more tests. Vaccines Your health care provider may recommend certain vaccines, such as:  Influenza vaccine. This is recommended every year.  Tetanus, diphtheria, and acellular pertussis (Tdap, Td) vaccine. You may need a Td booster every 10 years.  Varicella vaccine. You may need this if you have not been vaccinated.  Zoster vaccine. You may need this after age 1.  Measles, mumps, and rubella (MMR) vaccine. You may need at least one dose of MMR if you were born in 1957 or later. You may also need a second dose.  Pneumococcal 13-valent conjugate (PCV13) vaccine. One dose is recommended after age 34.  Pneumococcal polysaccharide (PPSV23) vaccine. One dose is recommended after age 69.  Meningococcal vaccine. You may need this if you have certain conditions.  Hepatitis A vaccine. You may need this if you have certain conditions or if you travel or work in places where you may be exposed to hepatitis A.  Hepatitis B vaccine. You may need this if you have certain conditions or if you travel or work in places where you may be exposed to hepatitis B.  Haemophilus influenzae type b (Hib) vaccine. You may need this if you have certain risk factors. Talk to your health care provider about which screenings and vaccines you need and how often you need them. This information is not intended to replace advice given to you by your health care  provider. Make sure you discuss any questions you have with your health care provider. Document Released: 05/21/2015 Document Revised: 06/14/2017 Document Reviewed: 02/23/2015 Elsevier Interactive Patient Education  2019 [employer].

## 2018-09-25 NOTE — Progress Notes (Signed)
Subjective:    Patient ID: [name] [name], male    DOB: [birth year], 68 y.o.   MRN: [id number]  HPI  Here to f/u; overall doing ok,  Pt denies chest pain, increasing sob or doe, wheezing, orthopnea, PND, increased LE swelling, palpitations, dizziness or syncope.  Pt denies new neurological symptoms such as new headache, or facial or extremity weakness or numbness.  Pt denies polydipsia, polyuria, or low sugar episode.  Pt states overall good compliance with meds, mostly trying to follow appropriate diet, with wt overall stable,  but little exercise however. Denies worsening depressive symptoms, suicidal ideation, or panic; has ongoing anxiety, increased recently with milder panic attacks; Denies urinary symptoms such as dysuria,  urgency, flank pain, hematuria or n/v, fever, chills, but has increased frequency and prior OAB med too expensive. BP Readings from Last 3 Encounters:  09/25/18 (!) 168/100  06/12/18 140/88  05/14/18 128/78   Wt Readings from Last 3 Encounters:  09/25/18 207 lb (93.9 kg)  06/12/18 204 lb (92.5 kg)  05/14/18 207 lb (93.9 kg)   Past Medical History:  Diagnosis Date  . BENIGN PROSTATIC HYPERTROPHY 05/06/2010  . COMMON MIGRAINE 07/31/2007  . GERD 07/31/2007  . HYPERLIPIDEMIA 01/04/2007  . HYPERTENSION 01/04/2007  . OVERACTIVE BLADDER 03/06/2007  . Prostate cancer ([place]) 02/04/15   Past Surgical History:  Procedure Laterality Date  . COLONOSCOPY  2009    reports that he has never smoked. He has never used smokeless tobacco. He reports that he does not drink alcohol or use drugs. family history includes Heart disease (age of onset: 80) in his brother; Lung cancer (age of onset: 47) in his father. Allergies  Allergen Reactions  . Celebrex [Celecoxib] Other (See Comments)    Stroke like symptoms  . Crestor [Rosuvastatin]     weakness  . Detrol [Tolterodine] Other (See Comments)    headache  . Ditropan [Oxybutynin] Other (See Comments)    Dry mouth  . Norvasc  [Amlodipine Besylate] Swelling  . Tadalafil Other (See Comments)    headache   Current Outpatient Medications on File Prior to Visit  Medication Sig Dispense Refill  . aspirin 81 MG tablet Take 81 mg by mouth daily.     . hydrochlorothiazide (MICROZIDE) 12.5 MG capsule Take 1 capsule (12.5 mg total) by mouth daily. 90 capsule 3  . latanoprost (XALATAN) 0.005 % ophthalmic solution Place 1 drop into the right eye daily.  1  . metoprolol succinate (TOPROL-XL) 25 MG 24 hr tablet Take 1 tablet (25 mg total) by mouth daily. 90 tablet 3  . polyethylene glycol (MIRALAX / GLYCOLAX) packet Take 17 g by mouth as needed.     . sildenafil (REVATIO) 20 MG tablet TAKE 3 TABLETS BY MOUTH EVERY DAY AS NEEDED 30 tablet 11  . Tamsulosin HCl (FLOMAX) 0.4 MG CAPS Take 1 capsule (0.4 mg total) by mouth daily. 30 capsule 11   No current facility-administered medications on file prior to visit.    Review of Systems  Constitutional: Negative for other unusual diaphoresis or sweats HENT: Negative for ear discharge or swelling Eyes: Negative for other worsening visual disturbances Respiratory: Negative for stridor or other swelling  Gastrointestinal: Negative for worsening distension or other blood Genitourinary: Negative for retention or other urinary change Musculoskeletal: Negative for other MSK pain or swelling Skin: Negative for color change or other new lesions Neurological: Negative for worsening tremors and other numbness  Psychiatric/Behavioral: Negative for worsening agitation or other fatigue All other system neg  per pt    Objective:   Physical Exam BP (!) 168/100   Pulse 74   Temp 97.9 F (36.6 C) (Oral)   Ht 5\' 7"  (1.702 m)   Wt 207 lb (93.9 kg)   SpO2 98%   BMI 32.42 kg/m  VS noted,  Constitutional: Pt appears in NAD HENT: Head: NCAT.  Right Ear: External ear normal.  Left Ear: External ear normal.  Eyes: . Pupils are equal, round, and reactive to light. Conjunctivae and EOM are  normal Nose: without d/c or deformity Neck: Neck supple. Gross normal ROM Cardiovascular: Normal rate and regular rhythm.   Pulmonary/Chest: Effort normal and breath sounds without rales or wheezing.  Abd:  Soft, NT, ND, + BS, no organomegaly Neurological: Pt is alert. At baseline orientation, motor grossly intact Skin: Skin is warm. No rashes, other new lesions, no LE edema Psychiatric: Pt behavior is normal without agitation \ No other exam findings Lab Results  Component Value Date   WBC 7.7 05/14/2018   HGB 14.9 05/14/2018   HCT 44.1 05/14/2018   PLT 269.0 05/14/2018   GLUCOSE 117 (H) 09/25/2018   CHOL 173 09/25/2018   TRIG 88.0 09/25/2018   HDL 60.00 09/25/2018   LDLDIRECT 139.2 10/29/2012   LDLCALC 95 09/25/2018   ALT 25 09/25/2018   AST 21 09/25/2018   NA 139 09/25/2018   K 3.5 09/25/2018   CL 101 09/25/2018   CREATININE 1.05 09/25/2018   BUN 14 09/25/2018   CO2 30 09/25/2018   TSH 1.39 03/26/2018   PSA 0.30 03/26/2018   INR 1.0 06/24/2007   HGBA1C 6.2 09/25/2018      Assessment & Plan:

## 2018-09-26 ENCOUNTER — Telehealth: Payer: Self-pay

## 2018-09-26 NOTE — Telephone Encounter (Signed)
Key: [id number]

## 2018-09-28 ENCOUNTER — Encounter: Payer: Self-pay | Admitting: Internal Medicine

## 2018-09-28 NOTE — Assessment & Plan Note (Signed)
stable overall by history and exam, recent data reviewed with pt, and pt to continue medical treatment as before,  to f/u any worsening symptoms or concerns  

## 2018-09-28 NOTE — Assessment & Plan Note (Signed)
Uncontrolled, ok for inreased benazepril 40 qd

## 2018-09-28 NOTE — Assessment & Plan Note (Signed)
Mild, declines SSRI, for klonopin bid prn,  to f/u any worsening symptoms or concerns

## 2018-09-28 NOTE — Assessment & Plan Note (Signed)
Mild uncontrolled, for increased lipitor 20 mg per day

## 2018-09-28 NOTE — Assessment & Plan Note (Signed)
For detrol LA 4 mg

## 2018-09-28 NOTE — Assessment & Plan Note (Signed)
Chronic, f/u urology

## 2018-10-17 DIAGNOSIS — C61 Malignant neoplasm of prostate: Secondary | ICD-10-CM | POA: Diagnosis not present

## 2018-10-22 ENCOUNTER — Encounter: Payer: Self-pay | Admitting: *Deleted

## 2018-11-02 ENCOUNTER — Ambulatory Visit (HOSPITAL_COMMUNITY)
Admission: EM | Admit: 2018-11-02 | Discharge: 2018-11-02 | Disposition: A | Discharge status: Home / Self Care | Payer: Medicare Other | Attending: Family | Admitting: Family

## 2018-11-02 ENCOUNTER — Other Ambulatory Visit: Payer: Self-pay

## 2018-11-02 ENCOUNTER — Encounter (HOSPITAL_COMMUNITY): Payer: Self-pay

## 2018-11-02 DIAGNOSIS — I10 Essential (primary) hypertension: Secondary | ICD-10-CM | POA: Diagnosis not present

## 2018-11-02 DIAGNOSIS — L089 Local infection of the skin and subcutaneous tissue, unspecified: Secondary | ICD-10-CM

## 2018-11-02 DIAGNOSIS — L729 Follicular cyst of the skin and subcutaneous tissue, unspecified: Secondary | ICD-10-CM | POA: Diagnosis not present

## 2018-11-02 MED ORDER — DOXYCYCLINE HYCLATE 100 MG PO CAPS: 100.0000 mg | ORAL_CAPSULE | Freq: Two times a day (BID) | ORAL | 0 refills | Status: AC

## 2018-11-02 NOTE — Discharge Instructions (Addendum)
Recommend start Doxycycline 100mg  twice a day as directed. Recommend apply warm compress to area on neck for comfort. Continue to monitor your blood pressure. Call your PCP on Monday to schedule appointment for follow-up on your blood pressure. If any chest pain, shortness of breath, numbness, dizziness or nausea occur, call 911 and go to the ER ASAP. Otherwise, follow-up with your PCP as planned.

## 2018-11-02 NOTE — ED Triage Notes (Signed)
Pt presents with bump on the back of right side of neck from unknown source.

## 2018-11-02 NOTE — ED Provider Notes (Signed)
[place]    CSN: [id number] Arrival date & time: 11/02/18  1004     History   Chief Complaint Chief Complaint  Patient presents with  . Bump on Neck    HPI [name] [name] is a 68 y.o. male.   68 year old male presents with "bump" on the right side of his neck along his hairline for the past 3 days. Has been getting larger and slightly more painful. Uncertain if he got "bit" by an insect. Not itchy. No discharge. Has applied rubbing alcohol to area with minimal relief. Other chronic health issues include HTN, hyperlipidemia, BPH, overactive bladder, anxiety and history of prostate cancer. Current medications include Lotensin, Toprol XL, HCTZ, Lipitor, Flomax, Detrol LA, aspirin, Klonopin, and Miralax.   The history is provided by the patient.    Past Medical History:  Diagnosis Date  . BENIGN PROSTATIC HYPERTROPHY 05/06/2010  . COMMON MIGRAINE 07/31/2007  . GERD 07/31/2007  . HYPERLIPIDEMIA 01/04/2007  . HYPERTENSION 01/04/2007  . OVERACTIVE BLADDER 03/06/2007  . Prostate cancer ([place]) 02/04/15    Patient Active Problem List   Diagnosis Date Noted  . Generalized anxiety disorder with panic attacks 09/25/2018  . Mid back pain on right side 05/14/2018  . Hyperglycemia 03/26/2018  . Low back pain 04/19/2017  . Lightheadedness 10/26/2016  . Eustachian tube dysfunction, right 08/17/2016  . Allergic rhinitis 08/17/2016  . Erectile dysfunction 08/17/2016  . Diastolic dysfunction 02/54/2706  . Syncope 04/04/2016  . Weakness 03/26/2015  . Malignant neoplasm of prostate ([place]) 02/23/2015  . Chest pain 10/01/2013  . Cough 08/04/2013  . Asthmatic bronchitis 07/15/2013  . Microhematuria 05/09/2012  . Constipation 08/13/2011  . Ventral hernia 08/13/2011  . Umbilical hernia 23/76/2831  . Preventative health care 04/25/2011  . Nocturia 06/29/2010  . BENIGN PROSTATIC HYPERTROPHY 05/06/2010  . BACK PAIN 05/06/2010  . SKIN LESION 11/30/2008  . FREQUENCY, URINARY  11/30/2008  . COMMON MIGRAINE 07/31/2007  . GERD 07/31/2007  . OVERACTIVE BLADDER 03/06/2007  . Hypercholesterolemia 01/04/2007  . Hypertensive disorder 01/04/2007    Past Surgical History:  Procedure Laterality Date  . COLONOSCOPY  2009       Home Medications    Prior to Admission medications   Medication Sig Start Date End Date Taking? Authorizing Provider  aspirin 81 MG tablet Take 81 mg by mouth daily.     [provider]  atorvastatin (LIPITOR) 20 MG tablet Take 1 tablet (20 mg total) by mouth daily. 09/25/18 09/25/19  Biagio Borg, MD  benazepril (LOTENSIN) 40 MG tablet Take 1 tablet (40 mg total) by mouth daily. 09/25/18   Biagio Borg, MD  clonazePAM (KLONOPIN) 0.5 MG tablet Take 1 tablet (0.5 mg total) by mouth 2 (two) times daily as needed for anxiety. 09/25/18   Biagio Borg, MD  doxycycline (VIBRAMYCIN) 100 MG capsule Take 1 capsule (100 mg total) by mouth 2 (two) times daily for 10 days. 11/02/18 11/12/18  Katy Apo, NP  hydrochlorothiazide (MICROZIDE) 12.5 MG capsule Take 1 capsule (12.5 mg total) by mouth daily. 04/26/18   Biagio Borg, MD  latanoprost (XALATAN) 0.005 % ophthalmic solution Place 1 drop into the right eye daily. 11/09/17   [provider]  metoprolol succinate (TOPROL-XL) 25 MG 24 hr tablet Take 1 tablet (25 mg total) by mouth daily. 05/23/18   Biagio Borg, MD  polyethylene glycol [facility] Floria Raveling) packet Take 17 g by mouth as needed.     [provider]  sildenafil (REVATIO) 20 MG tablet TAKE 3 TABLETS BY MOUTH EVERY DAY AS NEEDED 06/12/18   Biagio Borg, MD  Tamsulosin HCl (FLOMAX) 0.4 MG CAPS Take 1 capsule (0.4 mg total) by mouth daily. 05/08/11   Biagio Borg, MD  tolterodine (DETROL LA) 4 MG 24 hr capsule Take 1 capsule (4 mg total) by mouth daily. 09/25/18   Biagio Borg, MD  Vitamin D, Ergocalciferol, (DRISDOL) 1.25 MG (50000 UT) CAPS capsule Take 1 capsule (50,000 Units total) by mouth every 7 (seven) days.  09/25/18   Biagio Borg, MD    Family History Family History  Problem Relation Age of Onset  . Lung cancer Father 83  . Heart disease Brother 81       died from MI   . Colon cancer Neg Hx   . Esophageal cancer Neg Hx   . Rectal cancer Neg Hx   . Stomach cancer Neg Hx     Social History Social History   Tobacco Use  . Smoking status: Never Smoker  . Smokeless tobacco: Never Used  Substance Use Topics  . Alcohol use: No    Alcohol/week: 0.0 standard drinks  . Drug use: No     Allergies   Celebrex [celecoxib], Crestor [rosuvastatin], Detrol [tolterodine], Ditropan [oxybutynin], Norvasc [amlodipine besylate], and Tadalafil   Review of Systems Review of Systems  Constitutional: Negative for activity change, appetite change, chills, fatigue and fever.  HENT: Negative for congestion, ear discharge, ear pain, facial swelling, postnasal drip, rhinorrhea, sinus pressure, sinus pain and sore throat.   Respiratory: Negative for cough, chest tightness, shortness of breath and wheezing.   Gastrointestinal: Negative for nausea and vomiting.  Musculoskeletal: Negative for arthralgias, myalgias, neck pain and neck stiffness.  Skin: Positive for wound. Negative for rash.  Allergic/Immunologic: Negative for environmental allergies and food allergies.  Neurological: Positive for headaches. Negative for dizziness, tremors, seizures, syncope, weakness, light-headedness and numbness.  Hematological: Negative for adenopathy. Bruises/bleeds easily.     Physical Exam Triage Vital Signs ED Triage Vitals  Enc Vitals Group     BP 11/02/18 1019 (!) 197/100     Pulse Rate 11/02/18 1019 65     Resp 11/02/18 1019 18     Temp 11/02/18 1019 98.2 F (36.8 C)     Temp Source 11/02/18 1019 Oral     SpO2 11/02/18 1019 97 %     Weight --      Height --      Head Circumference --      Peak Flow --      Pain Score 11/02/18 1020 2     Pain Loc --      Pain Edu? --      Excl. in [place]? --    No  data found.  Updated Vital Signs BP (!) 158/90 (BP Location: Right Arm)   Pulse 65   Temp 98.2 F (36.8 C) (Oral)   Resp 18   SpO2 97%   Visual Acuity Right Eye Distance:   Left Eye Distance:   Bilateral Distance:    Right Eye Near:   Left Eye Near:    Bilateral Near:     Physical Exam Vitals signs and nursing note reviewed.  Constitutional:      General: He is awake. He is not in acute distress.    Appearance: He is well-developed and well-groomed. He is not ill-appearing.     Comments: Patient sitting comfortably on exam table in no  acute distress.   HENT:     Head: Normocephalic and atraumatic. Mass (cyst) present.      Comments: 1.5cm oval raised, red cyst present on right side of neck along hairline. Slightly red and warm. Mildly tender. No pustule or discharge seen. No other lesions present.     Right Ear: External ear normal.     Left Ear: External ear normal.  Eyes:     Extraocular Movements: Extraocular movements intact.     Conjunctiva/sclera: Conjunctivae normal.  Neck:     Musculoskeletal: Normal range of motion and neck supple. No neck rigidity or muscular tenderness.  Cardiovascular:     Rate and Rhythm: Normal rate and regular rhythm.     Pulses: Normal pulses.     Heart sounds: Normal heart sounds. No murmur.  Pulmonary:     Effort: Pulmonary effort is normal. No respiratory distress.     Breath sounds: Normal breath sounds. No stridor. No wheezing or rhonchi.  Musculoskeletal: Normal range of motion.  Lymphadenopathy:     Cervical: No cervical adenopathy.  Skin:    General: Skin is warm and dry.     Capillary Refill: Capillary refill takes less than 2 seconds.     Findings: Erythema present.  Neurological:     General: No focal deficit present.     Mental Status: He is alert and oriented to person, place, and time.  Psychiatric:        Mood and Affect: Mood normal.        Behavior: Behavior normal. Behavior is cooperative.        Thought  Content: Thought content normal.        Judgment: Judgment normal.      UC Treatments / Results  Labs (all labs ordered are listed, but only abnormal results are displayed) Labs Reviewed - No data to display  EKG None  Radiology No results found.  Procedures Procedures (including critical care time)  Medications Ordered in UC Medications - No data to display  Initial Impression / Assessment and Plan / UC Course  I have reviewed the triage vital signs and the nursing notes.  Pertinent labs & imaging results that were available during my care of the patient were reviewed by me and considered in my medical decision making (see chart for details).    Discussed with patient that he appears to have a mildly infected cyst on the back of his neck/head. Recommend start Doxycycline 100mg  twice a day as directed. May apply warm compress to area for comfort. Blood pressure decreased while at Urgent Care. However- still elevated despite 3 medications. Recommend continue to monitor and follow-up with his PCP next week for recheck. IF any chest pain, difficulty breathing, numbness or shortness of breath occur, go to the ER ASAP. Otherwise, follow-up with his PCP as planned.  Final Clinical Impressions(s) / UC Diagnoses   Final diagnoses:  Infected cyst of skin  Elevated blood pressure reading with diagnosis of hypertension     Discharge Instructions     Recommend start Doxycycline 100mg  twice a day as directed. Recommend apply warm compress to area on neck for comfort. Continue to monitor your blood pressure. Call your PCP on Monday to schedule appointment for follow-up on your blood pressure. If any chest pain, shortness of breath, numbness, dizziness or nausea occur, call 911 and go to the ER ASAP. Otherwise, follow-up with your PCP as planned.     ED Prescriptions    Medication Sig Dispense Auth.  Provider   doxycycline (VIBRAMYCIN) 100 MG capsule Take 1 capsule (100 mg total) by  mouth 2 (two) times daily for 10 days. 20 capsule Katy Apo, NP     Controlled Substance Prescriptions [place] Controlled Substance Registry consulted? Not Applicable   Katy Apo, NP 11/02/18 2304

## 2018-12-01 ENCOUNTER — Other Ambulatory Visit: Payer: Self-pay | Admitting: Internal Medicine

## 2018-12-01 DIAGNOSIS — I10 Essential (primary) hypertension: Secondary | ICD-10-CM

## 2018-12-11 ENCOUNTER — Other Ambulatory Visit: Payer: Self-pay | Admitting: Internal Medicine

## 2018-12-19 DIAGNOSIS — H16223 Keratoconjunctivitis sicca, not specified as Sjogren's, bilateral: Secondary | ICD-10-CM | POA: Diagnosis not present

## 2018-12-19 DIAGNOSIS — H0102B Squamous blepharitis left eye, upper and lower eyelids: Secondary | ICD-10-CM | POA: Diagnosis not present

## 2018-12-19 DIAGNOSIS — H0102A Squamous blepharitis right eye, upper and lower eyelids: Secondary | ICD-10-CM | POA: Diagnosis not present

## 2018-12-19 DIAGNOSIS — H18413 Arcus senilis, bilateral: Secondary | ICD-10-CM | POA: Diagnosis not present

## 2018-12-19 DIAGNOSIS — H11153 Pinguecula, bilateral: Secondary | ICD-10-CM | POA: Diagnosis not present

## 2018-12-19 DIAGNOSIS — H04123 Dry eye syndrome of bilateral lacrimal glands: Secondary | ICD-10-CM | POA: Diagnosis not present

## 2018-12-19 DIAGNOSIS — H35373 Puckering of macula, bilateral: Secondary | ICD-10-CM | POA: Diagnosis not present

## 2018-12-19 DIAGNOSIS — H401131 Primary open-angle glaucoma, bilateral, mild stage: Secondary | ICD-10-CM | POA: Diagnosis not present

## 2019-01-14 ENCOUNTER — Other Ambulatory Visit (INDEPENDENT_AMBULATORY_CARE_PROVIDER_SITE_OTHER): Payer: Medicare Other

## 2019-01-14 ENCOUNTER — Encounter: Payer: Self-pay | Admitting: Internal Medicine

## 2019-01-14 ENCOUNTER — Ambulatory Visit (INDEPENDENT_AMBULATORY_CARE_PROVIDER_SITE_OTHER): Payer: Medicare Other | Admitting: Internal Medicine

## 2019-01-14 ENCOUNTER — Other Ambulatory Visit: Payer: Self-pay

## 2019-01-14 VITALS — BP 128/88 | HR 74 | Temp 98.1°F | Ht 67.0 in | Wt 204.0 lb

## 2019-01-14 DIAGNOSIS — R002 Palpitations: Secondary | ICD-10-CM

## 2019-01-14 DIAGNOSIS — I5189 Other ill-defined heart diseases: Secondary | ICD-10-CM

## 2019-01-14 DIAGNOSIS — R000 Tachycardia, unspecified: Secondary | ICD-10-CM

## 2019-01-14 DIAGNOSIS — F411 Generalized anxiety disorder: Secondary | ICD-10-CM | POA: Diagnosis not present

## 2019-01-14 DIAGNOSIS — F410 Panic disorder [episodic paroxysmal anxiety] without agoraphobia: Secondary | ICD-10-CM

## 2019-01-14 DIAGNOSIS — R739 Hyperglycemia, unspecified: Secondary | ICD-10-CM

## 2019-01-14 DIAGNOSIS — Z23 Encounter for immunization: Secondary | ICD-10-CM

## 2019-01-14 LAB — BASIC METABOLIC PANEL
BUN: 11 mg/dL (ref 6–23)
CO2: 29 mEq/L (ref 19–32)
Calcium: 9.6 mg/dL (ref 8.4–10.5)
Chloride: 104 mEq/L (ref 96–112)
Creatinine, Ser: 1.08 mg/dL (ref 0.40–1.50)
GFR: 81.63 mL/min (ref >60.00)
Glucose, Bld: 116 mg/dL — ABNORMAL HIGH (ref 70–99)
Potassium: 4 mEq/L (ref 3.5–5.1)
Sodium: 142 mEq/L (ref 135–145)

## 2019-01-14 LAB — CBC WITH DIFFERENTIAL/PLATELET
Basophils Absolute: 0 10*3/uL (ref 0.0–0.1)
Basophils Relative: 0.4 % (ref 0.0–3.0)
Eosinophils Absolute: 0.1 10*3/uL (ref 0.0–0.7)
Eosinophils Relative: 0.7 % (ref 0.0–5.0)
HCT: 45.9 % (ref 39.0–52.0)
Hemoglobin: 15.4 g/dL (ref 13.0–17.0)
Lymphocytes Relative: 19.2 % (ref 12.0–46.0)
Lymphs Abs: 1.6 10*3/uL (ref 0.7–4.0)
MCHC: 33.5 g/dL (ref 30.0–36.0)
MCV: 94 fl (ref 78.0–100.0)
Monocytes Absolute: 0.6 10*3/uL (ref 0.1–1.0)
Monocytes Relative: 7.3 % (ref 3.0–12.0)
Neutro Abs: 5.9 10*3/uL (ref 1.4–7.7)
Neutrophils Relative %: 72.4 % (ref 43.0–77.0)
Platelets: 248 10*3/uL (ref 150.0–400.0)
RBC: 4.88 Mil/uL (ref 4.22–5.81)
RDW: 14.3 % (ref 11.5–15.5)
WBC: 8.1 10*3/uL (ref 4.0–10.5)

## 2019-01-14 LAB — HEMOGLOBIN A1C: Hgb A1c MFr Bld: 6.3 % (ref 4.6–6.5)

## 2019-01-14 LAB — TSH: TSH: 0.99 u[IU]/mL (ref 0.35–4.50)

## 2019-01-14 MED ORDER — BENAZEPRIL HCL 40 MG PO TABS: 20.0000 mg | ORAL_TABLET | Freq: Every day | ORAL | 3 refills | Status: DC

## 2019-01-14 NOTE — Assessment & Plan Note (Signed)
Has ongoing anxiety, but I am not convinced this is his main issue, cont same tx

## 2019-01-14 NOTE — Patient Instructions (Addendum)
Your EKG was Ok today  OK to stay off the fluid pill as you have been doing  OK to decrease the Lotensin (benazepril) to 20 mg per day  Please continue all other medications as before, including the toprol XL at 25 mg per day  Please have the pharmacy call with any other refills you may need.  Please continue your efforts at being more active, low cholesterol diet, and weight control.  Please keep your appointments with your specialists as you may have planned  You will be contacted regarding the referral for: Echocardiogram, and Cardiology

## 2019-01-14 NOTE — Assessment & Plan Note (Signed)
stable overall by history and exam, recent data reviewed with pt, and pt to continue medical treatment as before,  to f/u any worsening symptoms or concerns  

## 2019-01-14 NOTE — Progress Notes (Addendum)
Subjective:    Patient ID: [name] [name], male    DOB: [birth year], 68 y.o.   MRN: [id number]  HPI  Here to f/u in office after initially thought to be a phone visit; overall doing ok,  Pt denies chest pain, increasing sob or doe, wheezing, orthopnea, PND, increased LE swelling (now off the amlodipine) or syncope.  Pt denies new neurological symptoms such as new headache, or facial or extremity weakness or numbness.  Pt denies polydipsia, polyuria, or low sugar episode.  Pt states overall good compliance with meds, mostly trying to follow appropriate diet, with wt overall stable,  but little exercise however.  BP conts to fluctuate at home, some as low as sbp 70 and "I cant do anything." though this am was 147/70.  Has had 3 episodes in the past mo.  HR has been up to160's when BP was low, but also high HRs sometimes even with sbp 130.  No syncope, has thought about going to ED but did not do this. Episodes have lasted 15-30 min. Has never seen low HR at home.  Admits to not taking the hct for some reason.  Last echo Jan 2018 with normal EF, grade 2 diast dysfsn, RAA.  Also taking eye drops per optho but has stopped, then restarted then stopped again with perceived association with symptoms.   Past Medical History:  Diagnosis Date  . BENIGN PROSTATIC HYPERTROPHY 05/06/2010  . COMMON MIGRAINE 07/31/2007  . GERD 07/31/2007  . HYPERLIPIDEMIA 01/04/2007  . HYPERTENSION 01/04/2007  . OVERACTIVE BLADDER 03/06/2007  . Prostate cancer ([place]) 02/04/15   Past Surgical History:  Procedure Laterality Date  . COLONOSCOPY  2009    reports that he has never smoked. He has never used smokeless tobacco. He reports that he does not drink alcohol or use drugs. family history includes Heart disease (age of onset: 27) in his brother; Lung cancer (age of onset: 63) in his father. Allergies  Allergen Reactions  . Celebrex [Celecoxib] Other (See Comments)    Stroke like symptoms  . Crestor [Rosuvastatin]     weakness   . Detrol [Tolterodine] Other (See Comments)    headache  . Ditropan [Oxybutynin] Other (See Comments)    Dry mouth  . Norvasc [Amlodipine Besylate] Swelling  . Tadalafil Other (See Comments)    headache   Current Outpatient Medications on File Prior to Visit  Medication Sig Dispense Refill  . aspirin 81 MG tablet Take 81 mg by mouth daily.     Marland Kitchen atorvastatin (LIPITOR) 20 MG tablet Take 1 tablet (20 mg total) by mouth daily. 90 tablet 3  . benazepril (LOTENSIN) 40 MG tablet Take 1 tablet (40 mg total) by mouth daily. 90 tablet 3  . clonazePAM (KLONOPIN) 0.5 MG tablet Take 1 tablet (0.5 mg total) by mouth 2 (two) times daily as needed for anxiety. 60 tablet 1  . latanoprost (XALATAN) 0.005 % ophthalmic solution Place 1 drop into the right eye daily.  1  . metoprolol succinate (TOPROL-XL) 25 MG 24 hr tablet Take 1 tablet (25 mg total) by mouth daily. 90 tablet 3  . polyethylene glycol (MIRALAX / GLYCOLAX) packet Take 17 g by mouth as needed.     . sildenafil (REVATIO) 20 MG tablet TAKE 3 TABLETS BY MOUTH EVERY DAY AS NEEDED 30 tablet 11  . Tamsulosin HCl (FLOMAX) 0.4 MG CAPS Take 1 capsule (0.4 mg total) by mouth daily. 30 capsule 11  . tolterodine (DETROL LA) 4 MG 24 hr  capsule Take 1 capsule (4 mg total) by mouth daily. 90 capsule 3  . Vitamin D, Ergocalciferol, (DRISDOL) 1.25 MG (50000 UT) CAPS capsule Take 1 capsule (50,000 Units total) by mouth every 7 (seven) days. 12 capsule 0   No current facility-administered medications on file prior to visit.    Review of Systems  Constitutional: Negative for other unusual diaphoresis or sweats HENT: Negative for ear discharge or swelling Eyes: Negative for other worsening visual disturbances Respiratory: Negative for stridor or other swelling  Gastrointestinal: Negative for worsening distension or other blood Genitourinary: Negative for retention or other urinary change Musculoskeletal: Negative for other MSK pain or swelling Skin: Negative  for color change or other new lesions Neurological: Negative for worsening tremors and other numbness  Psychiatric/Behavioral: Negative for worsening agitation or other fatigue ALl other system neg per pt    Objective:   Physical Exam BP 128/88   Pulse 74   Temp 98.1 F (36.7 C) (Oral)   Ht 5\' 7"  (1.702 m)   Wt 204 lb (92.5 kg)   SpO2 97%   BMI 31.95 kg/m  VS noted,  Constitutional: Pt appears in NAD HENT: Head: NCAT.  Right Ear: External ear normal.  Left Ear: External ear normal.  Eyes: . Pupils are equal, round, and reactive to light. Conjunctivae and EOM are normal Nose: without d/c or deformity Neck: Neck supple. Gross normal ROM Cardiovascular: Normal rate and regular rhythm.  - benign exam Pulmonary/Chest: Effort normal and breath sounds without rales or wheezing.  Abd:  Soft, NT, ND, + BS, no organomegaly Neurological: Pt is alert. At baseline orientation, motor grossly intact Skin: Skin is warm. No rashes, other new lesions, no LE edema Psychiatric: Pt behavior is normal without agitation , mild nervous Lab Results  Component Value Date   WBC 7.7 05/14/2018   HGB 14.9 05/14/2018   HCT 44.1 05/14/2018   PLT 269.0 05/14/2018   GLUCOSE 117 (H) 09/25/2018   CHOL 173 09/25/2018   TRIG 88.0 09/25/2018   HDL 60.00 09/25/2018   LDLDIRECT 139.2 10/29/2012   LDLCALC 95 09/25/2018   ALT 25 09/25/2018   AST 21 09/25/2018   NA 139 09/25/2018   K 3.5 09/25/2018   CL 101 09/25/2018   CREATININE 1.05 09/25/2018   BUN 14 09/25/2018   CO2 30 09/25/2018   TSH 1.39 03/26/2018   PSA 0.30 03/26/2018   INR 1.0 06/24/2007   HGBA1C 6.2 09/25/2018   ECG I have personaly interpreted - SR with 1st degree avb     Assessment & Plan:

## 2019-01-14 NOTE — Assessment & Plan Note (Addendum)
For ecg today, ? Etiology, ? overcontrolled HTN vs arrythmia, will decrease the lotensin from 40 to 20 mg, cont the toprol xl 25 mg per now, for echo, labs as ordered, and cardiology referral -  ? Need holter  Note:  Total time for pt hx, exam, review of record with pt in the room, determination of diagnoses and plan for further eval and tx is > 40 min, with over 50% spent in coordination and counseling of patient including the differential dx, tx, further evaluation and other management of tachycardia, HTN, hyperglycemia, anxiety

## 2019-01-17 ENCOUNTER — Encounter: Payer: Self-pay | Admitting: Internal Medicine

## 2019-01-23 ENCOUNTER — Ambulatory Visit (HOSPITAL_COMMUNITY): Payer: Medicare Other | Attending: Internal Medicine

## 2019-01-23 ENCOUNTER — Other Ambulatory Visit: Payer: Self-pay

## 2019-01-23 DIAGNOSIS — I5189 Other ill-defined heart diseases: Secondary | ICD-10-CM | POA: Diagnosis not present

## 2019-01-28 ENCOUNTER — Encounter: Payer: Self-pay | Admitting: Internal Medicine

## 2019-01-29 ENCOUNTER — Encounter: Payer: Self-pay | Admitting: Internal Medicine

## 2019-02-25 ENCOUNTER — Ambulatory Visit (INDEPENDENT_AMBULATORY_CARE_PROVIDER_SITE_OTHER): Payer: Medicare Other | Admitting: Cardiology

## 2019-02-25 ENCOUNTER — Encounter: Payer: Self-pay | Admitting: Cardiology

## 2019-02-25 ENCOUNTER — Other Ambulatory Visit: Payer: Self-pay

## 2019-02-25 VITALS — BP 145/85 | HR 70 | Ht 67.0 in | Wt 202.0 lb

## 2019-02-25 DIAGNOSIS — Z7189 Other specified counseling: Secondary | ICD-10-CM | POA: Diagnosis not present

## 2019-02-25 DIAGNOSIS — R0989 Other specified symptoms and signs involving the circulatory and respiratory systems: Secondary | ICD-10-CM | POA: Diagnosis not present

## 2019-02-25 DIAGNOSIS — R002 Palpitations: Secondary | ICD-10-CM

## 2019-02-25 DIAGNOSIS — I479 Paroxysmal tachycardia, unspecified: Secondary | ICD-10-CM

## 2019-02-25 DIAGNOSIS — E7800 Pure hypercholesterolemia, unspecified: Secondary | ICD-10-CM

## 2019-02-25 NOTE — Patient Instructions (Signed)
Medication Instructions:  Your Physician recommend you continue on your current medication as directed.    *If you need a refill on your cardiac medications before your next appointment, please call your pharmacy*  Lab Work: None  Testing/Procedures: None  Follow-Up: At [facility], you and your health needs are our priority.  As part of our continuing mission to provide you with exceptional heart care, we have created designated Provider Care Teams.  These Care Teams include your primary Cardiologist (physician) and Advanced Practice Providers (APPs -  Physician Assistants and Nurse Practitioners) who all work together to provide you with the care you need, when you need it.  Your next appointment:   3 months  The format for your next appointment:   In Person  Provider:   Bridgette Christopher, MD   

## 2019-02-25 NOTE — Progress Notes (Signed)
Cardiology Office Note:    Date:  02/25/2019   ID:  [name] [name], DOB December 28, 1950, MRN [id number]  PCP:  Biagio Borg, MD  Cardiologist:  Buford Dresser, MD  Referring MD: Biagio Borg, MD   CC: new patient evaluation for intermittent tachycardia and labile blood pressure  History of Present Illness:    [name] [name] is a 68 y.o. male with a hx of hypertension, hyperlipidemia, BPH/prior prostate cancer who is seen as a new consult at the request of Biagio Borg, MD for the evaluation and management of palpitations/tachycardia and labile blood pressures.  Patient with no concerns today beyond HR/BP.  Late last year, first started noticing swings in his BP. Has improved in the last two months. Notes that he occasionally now sees his HR peak in the 150s and then drops back to his normal in the 50s-60s. Can see fast HR both during/just after activity and also at rest. Checks with BP cuff, notes that BP is "a little low" when his HR is fast. Has seen systolics at 90 or below. Doesn't have a log with him today, recalling from memory. Had one event where BP was low/HR was high about 4-5 mos ago (he believes it was in March) where he felt like he might pass out. Unclear if he had a syncopal event several months ago--did not fall, was sitting at the table, wife came to check on him and he had leaned back in his chair but he responded immediately--he does not think he lost consciousness. Usually episodes were brief, none longer than 30 min. No clear aggravating or alleviating factors.   Hasn't had any events in several months. Cannot associate the events with any clear changes or other medical issues. Sugars have been stable.  Checks blood pressure at home 3-4 times/day. Doesn't have log today, but reports they have bene stable. This AM [id number] systolic before meds.  Denies chest pain, shortness of breath at rest or with normal exertion. No PND, orthopnea, LE edema or unexpected weight gain.  No syncope.  FH: brother died of MI in his 76s. Mother had CHF.    Past Medical History:  Diagnosis Date  . BENIGN PROSTATIC HYPERTROPHY 05/06/2010  . COMMON MIGRAINE 07/31/2007  . GERD 07/31/2007  . HYPERLIPIDEMIA 01/04/2007  . HYPERTENSION 01/04/2007  . OVERACTIVE BLADDER 03/06/2007  . Prostate cancer ([place]) 02/04/15    Past Surgical History:  Procedure Laterality Date  . COLONOSCOPY  2009    Current Medications: Current Outpatient Medications on File Prior to Visit  Medication Sig  . aspirin 81 MG tablet Take 81 mg by mouth daily.   Marland Kitchen atorvastatin (LIPITOR) 20 MG tablet Take 1 tablet (20 mg total) by mouth daily.  . benazepril (LOTENSIN) 40 MG tablet Take 0.5 tablets (20 mg total) by mouth daily.  . clonazePAM (KLONOPIN) 0.5 MG tablet Take 1 tablet (0.5 mg total) by mouth 2 (two) times daily as needed for anxiety.  Marland Kitchen latanoprost (XALATAN) 0.005 % ophthalmic solution Place 1 drop into the right eye daily.  . polyethylene glycol (MIRALAX / GLYCOLAX) packet Take 17 g by mouth as needed.   . sildenafil (REVATIO) 20 MG tablet TAKE 3 TABLETS BY MOUTH EVERY DAY AS NEEDED  . Tamsulosin HCl (FLOMAX) 0.4 MG CAPS Take 1 capsule (0.4 mg total) by mouth daily.  . Vitamin D, Ergocalciferol, (DRISDOL) 1.25 MG (50000 UT) CAPS capsule Take 1 capsule (50,000 Units total) by mouth every 7 (seven) days.   No  current facility-administered medications on file prior to visit.      Allergies:   Celebrex [celecoxib], Crestor [rosuvastatin], Detrol [tolterodine], Ditropan [oxybutynin], Norvasc [amlodipine besylate], and Tadalafil   Social History   Tobacco Use  . Smoking status: Never Smoker  . Smokeless tobacco: Never Used  Substance Use Topics  . Alcohol use: No    Alcohol/week: 0.0 standard drinks  . Drug use: No    Family History: family history includes Heart disease (age of onset: 7) in his brother; Lung cancer (age of onset: 49) in his father. There is no history of Colon cancer,  Esophageal cancer, Rectal cancer, or Stomach cancer.  FH: brother died of MI in his 41s. Mother had CHF.   ROS:   Please see the history of present illness.  Additional pertinent ROS: Constitutional: Negative for chills, fever, night sweats, unintentional weight loss  HENT: Negative for ear pain and hearing loss.   Eyes: Negative for loss of vision and eye pain.  Respiratory: Negative for cough, sputum, wheezing.   Cardiovascular: See HPI. Gastrointestinal: Negative for abdominal pain, melena, and hematochezia.  Genitourinary: Negative for dysuria and hematuria.  Musculoskeletal: Negative for falls and myalgias.  Skin: Negative for itching and rash.  Neurological: Negative for focal weakness, focal sensory changes and loss of consciousness.  Endo/Heme/Allergies: Does not bruise/bleed easily.     EKGs/Labs/Other Studies Reviewed:    The following studies were reviewed today: Echo 01/23/19  1. Left ventricular ejection fraction, by visual estimation, is 60 to 65%. The left ventricle has normal function. Left ventricular septal wall thickness was normal. Normal left ventricular posterior wall thickness. There is no left ventricular  hypertrophy.  2. Left ventricular diastolic Doppler parameters are consistent with pseudonormalization pattern of LV diastolic filling.  3. GLS = -19.0%.  4. Global right ventricle has normal systolic function.The right ventricular size is normal. No increase in right ventricular wall thickness.  5. Left atrial size was normal.  6. Right atrial size was normal.  7. The mitral valve is normal in structure. Trace mitral valve regurgitation.  8. The tricuspid valve is normal in structure. Tricuspid valve regurgitation was not visualized by color flow Doppler.  9. The aortic valve The aortic valve is tricuspid Aortic valve regurgitation was not visualized by color flow Doppler. Mild aortic valve sclerosis without stenosis. 10. The pulmonic valve was normal in  structure. Pulmonic valve regurgitation is not visualized by color flow Doppler. 11. The atrial septum is grossly normal.  MPI 12/02/13 Low risk stress nuclear study with a very small reversible defect in the mid and basal inferior wall which is most likely related to variations in diaphragmatic attenunation but cannot rule out a very small area of ischemia..  LV Ejection Fraction: 57%.  LV Wall Motion:  NL LV Function; NL Wall Motion   EKG:  EKG is personally reviewed.  The ekg ordered today demonstrates SR with 1st degree AV block, nonspecific TWI lateral leads, unchanged from prior  Recent Labs: 09/25/2018: ALT 25 01/14/2019: BUN 11; Creatinine, Ser 1.08; Hemoglobin 15.4; Platelets 248.0; Potassium 4.0; Sodium 142; TSH 0.99  Recent Lipid Panel    Component Value Date/Time   CHOL 173 09/25/2018 0949   TRIG 88.0 09/25/2018 0949   HDL 60.00 09/25/2018 0949   CHOLHDL 3 09/25/2018 0949   VLDL 17.6 09/25/2018 0949   LDLCALC 95 09/25/2018 0949   LDLDIRECT 139.2 10/29/2012 1421    Physical Exam:    VS:  BP (!) 145/85   Pulse  70   Ht 5\' 7"  (1.702 m)   Wt 202 lb (91.6 kg)   SpO2 98%   BMI 31.64 kg/m     Wt Readings from Last 3 Encounters:  02/25/19 202 lb (91.6 kg)  01/14/19 204 lb (92.5 kg)  09/25/18 207 lb (93.9 kg)    GEN: Well nourished, well developed in no acute distress HEENT: Normal, moist mucous membranes NECK: No JVD CARDIAC: regular rhythm, normal S1 and S2, no murmurs, rubs, gallops.  VASCULAR: Radial and DP pulses 2+ bilaterally. No carotid bruits RESPIRATORY:  Clear to auscultation without rales, wheezing or rhonchi  ABDOMEN: Soft, non-tender, non-distended MUSCULOSKELETAL:  Ambulates independently SKIN: Warm and dry, no edema NEUROLOGIC:  Alert and oriented x 3. No focal neuro deficits noted. PSYCHIATRIC:  Normal affect    ASSESSMENT:    1. Palpitation   2. Paroxysmal tachycardia (HCC)   3. Labile hypertension   4. Cardiac risk counseling   5.  Counseling on health promotion and disease prevention   6. Hypercholesterolemia    PLAN:    Intermittent tachycardia, palpitations: this has since resolved. We discussed at length options for monitoring/management -as symptoms now resolved, will hold on cardiac monitor, as it is likely to be low yield. -if symptoms recur, he will call, and we will send a monitor to his house -counseled on red flag warning signs -no clear aggravating/alleviating factors -ECG unremarkable today -recent echo without significant abnormalities -prior MPI 2015, but no chest pain/anginal symptoms  Labile BP: initially very elevated on arrival (170s), improved with resting. Reports normal BP at home -with history of recent hypotension, will not overtreat. Continue home BP measurements. Would be helpful to have a log, bring cuff to office on next visit -continue benazepril 20 mg daily  Hypercholesterolemia: elevated ASCVD risk -LDL 95 09/2018, at goal (<100) -continue atorvastatin 20 mg daily, aspirin 81 mg daily (this is optional, but with hyperglycemia/nearing diabetes, reasonable to continue as he reports no bleeding issues)  Cardiac risk counseling and prevention recommendations: -recommend heart healthy/Mediterranean diet, with whole grains, fruits, vegetable, fish, lean meats, nuts, and olive oil. Limit salt. -recommend moderate walking, 3-5 times/week for 30-50 minutes each session. Aim for at least 150 minutes.week. Goal should be pace of 3 miles/hours, or walking 1.5 miles in 30 minutes -recommend avoidance of tobacco products. Avoid excess alcohol. -Additional risk factor control:  -Diabetes: A1c is 6.3, denies formal diabetes  -Lipids: as above  -Blood pressure control: as above  -Weight: BMI 31 -ASCVD risk score: The 10-year ASCVD risk score Mikey Bussing DC Brooke Bonito., et al., 2013) is: 21.5%   Values used to calculate the score:     Age: 89 years     Sex: Male     Is Non-Hispanic African American: Yes      Diabetic: No     Tobacco smoker: No     Systolic Blood Pressure: Q000111Q mmHg     Is BP treated: Yes     HDL Cholesterol: 60 mg/dL     Total Cholesterol: 173 mg/dL    Plan for follow up: 3 mos or sooner PRN  Medication Adjustments/Labs and Tests Ordered: Current medicines are reviewed at length with the patient today.  Concerns regarding medicines are outlined above.  Orders Placed This Encounter  Procedures  . EKG 12-Lead   No orders of the defined types were placed in this encounter.   Patient Instructions  Medication Instructions:  Your Physician recommend you continue on your current medication as directed.    *  If you need a refill on your cardiac medications before your next appointment, please call your pharmacy*  Lab Work: None   Testing/Procedures: None.      Follow-Up: At [facility], you and your health needs are our priority.  As part of our continuing mission to provide you with exceptional heart care, we have created designated Provider Care Teams.  These Care Teams include your primary Cardiologist (physician) and Advanced Practice Providers (APPs -  Physician Assistants and Nurse Practitioners) who all work together to provide you with the care you need, when you need it.  Your next appointment:   3 months  The format for your next appointment:   In Person  Provider:   Buford Dresser, MD     Signed, Buford Dresser, MD PhD 02/25/2019 9:02 AM    [place]

## 2019-03-12 ENCOUNTER — Encounter: Payer: Self-pay | Admitting: Internal Medicine

## 2019-03-12 MED ORDER — SILDENAFIL CITRATE 20 MG PO TABS: ORAL_TABLET | ORAL | 11 refills | Status: DC

## 2019-03-28 ENCOUNTER — Other Ambulatory Visit (INDEPENDENT_AMBULATORY_CARE_PROVIDER_SITE_OTHER): Payer: Medicare Other

## 2019-03-28 ENCOUNTER — Other Ambulatory Visit: Payer: Self-pay

## 2019-03-28 ENCOUNTER — Encounter: Payer: Self-pay | Admitting: Internal Medicine

## 2019-03-28 ENCOUNTER — Ambulatory Visit (INDEPENDENT_AMBULATORY_CARE_PROVIDER_SITE_OTHER): Payer: Medicare Other | Admitting: Internal Medicine

## 2019-03-28 VITALS — BP 124/78 | HR 76 | Temp 97.9°F | Ht 67.0 in | Wt 201.0 lb

## 2019-03-28 DIAGNOSIS — R0989 Other specified symptoms and signs involving the circulatory and respiratory systems: Secondary | ICD-10-CM

## 2019-03-28 DIAGNOSIS — E7800 Pure hypercholesterolemia, unspecified: Secondary | ICD-10-CM | POA: Diagnosis not present

## 2019-03-28 DIAGNOSIS — C61 Malignant neoplasm of prostate: Secondary | ICD-10-CM

## 2019-03-28 DIAGNOSIS — R739 Hyperglycemia, unspecified: Secondary | ICD-10-CM | POA: Diagnosis not present

## 2019-03-28 LAB — PSA: PSA: 0.23 ng/mL (ref 0.10–4.00)

## 2019-03-28 NOTE — Progress Notes (Addendum)
Subjective:    Patient ID: [name] [name], male    DOB: 05/10/1950, 68 y.o.   MRN: [id number]  HPI    Here to f/u; overall doing ok,  Pt denies chest pain, increasing sob or doe, wheezing, orthopnea, PND, increased LE swelling, palpitations, dizziness or syncope.  Pt denies new neurological symptoms such as new headache, or facial or extremity weakness or numbness.  Pt denies polydipsia, polyuria, or low sugar episode.  Pt states overall good compliance with meds, mostly trying to follow appropriate diet, with wt overall stable,  but little exercise however BP Readings from Last 3 Encounters:  03/28/19 124/78  02/25/19 (!) 145/85  01/14/19 128/88   Wt Readings from Last 3 Encounters:  03/28/19 201 lb (91.2 kg)  02/25/19 202 lb (91.6 kg)  01/14/19 204 lb (92.5 kg)   Past Medical History:  Diagnosis Date  . BENIGN PROSTATIC HYPERTROPHY 05/06/2010  . COMMON MIGRAINE 07/31/2007  . GERD 07/31/2007  . HYPERLIPIDEMIA 01/04/2007  . HYPERTENSION 01/04/2007  . OVERACTIVE BLADDER 03/06/2007  . Prostate cancer ([place]) 02/04/15   Past Surgical History:  Procedure Laterality Date  . COLONOSCOPY  2009    reports that he has never smoked. He has never used smokeless tobacco. He reports that he does not drink alcohol or use drugs. family history includes Heart disease (age of onset: 49) in his brother; Lung cancer (age of onset: 74) in his father. Allergies  Allergen Reactions  . Celebrex [Celecoxib] Other (See Comments)    Stroke like symptoms  . Crestor [Rosuvastatin]     weakness  . Detrol [Tolterodine] Other (See Comments)    headache  . Ditropan [Oxybutynin] Other (See Comments)    Dry mouth  . Norvasc [Amlodipine Besylate] Swelling  . Tadalafil Other (See Comments)    headache   Current Outpatient Medications on File Prior to Visit  Medication Sig Dispense Refill  . aspirin 81 MG tablet Take 81 mg by mouth daily.     Marland Kitchen atorvastatin (LIPITOR) 20 MG tablet Take 1 tablet (20 mg total) by  mouth daily. 90 tablet 3  . benazepril (LOTENSIN) 40 MG tablet Take 0.5 tablets (20 mg total) by mouth daily. 45 tablet 3  . clonazePAM (KLONOPIN) 0.5 MG tablet Take 1 tablet (0.5 mg total) by mouth 2 (two) times daily as needed for anxiety. 60 tablet 1  . latanoprost (XALATAN) 0.005 % ophthalmic solution Place 1 drop into the right eye daily.  1  . polyethylene glycol (MIRALAX / GLYCOLAX) packet Take 17 g by mouth as needed.     . sildenafil (REVATIO) 20 MG tablet TAKE 3 TABLETS BY MOUTH EVERY DAY AS NEEDED 90 tablet 11  . Tamsulosin HCl (FLOMAX) 0.4 MG CAPS Take 1 capsule (0.4 mg total) by mouth daily. 30 capsule 11  . Vitamin D, Ergocalciferol, (DRISDOL) 1.25 MG (50000 UT) CAPS capsule Take 1 capsule (50,000 Units total) by mouth every 7 (seven) days. 12 capsule 0   No current facility-administered medications on file prior to visit.    Review of Systems  Constitutional: Negative for other unusual diaphoresis or sweats HENT: Negative for ear discharge or swelling Eyes: Negative for other worsening visual disturbances Respiratory: Negative for stridor or other swelling  Gastrointestinal: Negative for worsening distension or other blood Genitourinary: Negative for retention or other urinary change Musculoskeletal: Negative for other MSK pain or swelling Skin: Negative for color change or other new lesions Neurological: Negative for worsening tremors and other numbness  Psychiatric/Behavioral: Negative  for worsening agitation or other fatigue All otherwise neg per pt     Objective:   Physical Exam BP 124/78   Pulse 76   Temp 97.9 F (36.6 C) (Oral)   Ht 5\' 7"  (1.702 m)   Wt 201 lb (91.2 kg)   SpO2 98%   BMI 31.48 kg/m  VS noted,  Constitutional: Pt appears in NAD HENT: Head: NCAT.  Right Ear: External ear normal.  Left Ear: External ear normal.  Eyes: . Pupils are equal, round, and reactive to light. Conjunctivae and EOM are normal Nose: without d/c or deformity Neck: Neck  supple. Gross normal ROM Cardiovascular: Normal rate and regular rhythm.   Pulmonary/Chest: Effort normal and breath sounds without rales or wheezing.  Abd:  Soft, NT, ND, + BS, no organomegaly Neurological: Pt is alert. At baseline orientation, motor grossly intact Skin: Skin is warm. No rashes, other new lesions, no LE edema Psychiatric: Pt behavior is normal without agitation  All otherwise neg per pt  Lab Results  Component Value Date   WBC 8.1 01/14/2019   HGB 15.4 01/14/2019   HCT 45.9 01/14/2019   PLT 248.0 01/14/2019   GLUCOSE 116 (H) 01/14/2019   CHOL 173 09/25/2018   TRIG 88.0 09/25/2018   HDL 60.00 09/25/2018   LDLDIRECT 139.2 10/29/2012   LDLCALC 95 09/25/2018   ALT 25 09/25/2018   AST 21 09/25/2018   NA 142 01/14/2019   K 4.0 01/14/2019   CL 104 01/14/2019   CREATININE 1.08 01/14/2019   BUN 11 01/14/2019   CO2 29 01/14/2019   TSH 0.99 01/14/2019   PSA 0.30 03/26/2018   INR 1.0 06/24/2007   HGBA1C 6.3 01/14/2019   This visit occurred during the SARS-CoV-2 public health emergency.  Safety protocols were in place, including screening questions prior to the visit, additional usage of staff PPE, and extensive cleaning of exam room while observing appropriate contact time as indicated for disinfecting solutions.       Assessment & Plan:

## 2019-03-28 NOTE — Patient Instructions (Signed)

## 2019-03-28 NOTE — Assessment & Plan Note (Signed)
stable overall by history and exam, recent data reviewed with pt, and pt to continue medical treatment as before,  to f/u any worsening symptoms or concerns  

## 2019-03-28 NOTE — Assessment & Plan Note (Signed)
Has f/u appt dec 2020 with Dr Lovena Neighbours, for PSA today

## 2019-04-22 DIAGNOSIS — C61 Malignant neoplasm of prostate: Secondary | ICD-10-CM | POA: Diagnosis not present

## 2019-05-14 ENCOUNTER — Encounter: Payer: Self-pay | Admitting: Internal Medicine

## 2019-05-15 ENCOUNTER — Encounter: Payer: Self-pay | Admitting: Internal Medicine

## 2019-05-15 ENCOUNTER — Other Ambulatory Visit: Payer: Self-pay

## 2019-05-15 ENCOUNTER — Ambulatory Visit (INDEPENDENT_AMBULATORY_CARE_PROVIDER_SITE_OTHER): Payer: Medicare Other | Admitting: Internal Medicine

## 2019-05-15 VITALS — BP 156/90 | HR 77 | Temp 98.9°F | Resp 12 | Ht 67.0 in | Wt 206.0 lb

## 2019-05-15 DIAGNOSIS — R0989 Other specified symptoms and signs involving the circulatory and respiratory systems: Secondary | ICD-10-CM | POA: Diagnosis not present

## 2019-05-15 DIAGNOSIS — R739 Hyperglycemia, unspecified: Secondary | ICD-10-CM | POA: Diagnosis not present

## 2019-05-15 DIAGNOSIS — R319 Hematuria, unspecified: Secondary | ICD-10-CM | POA: Diagnosis not present

## 2019-05-15 DIAGNOSIS — R300 Dysuria: Secondary | ICD-10-CM | POA: Diagnosis not present

## 2019-05-15 LAB — URINALYSIS, ROUTINE W REFLEX MICROSCOPIC
Bilirubin Urine: NEGATIVE
Nitrite: NEGATIVE
Specific Gravity, Urine: 1.01 (ref 1.000–1.030)
Total Protein, Urine: 100 — AB
Urine Glucose: NEGATIVE
Urobilinogen, UA: 0.2 (ref 0.0–1.0)
pH: 7 (ref 5.0–8.0)

## 2019-05-15 LAB — POCT URINALYSIS DIPSTICK
Bilirubin, UA: NEGATIVE
Glucose, UA: NEGATIVE
Ketones, UA: NEGATIVE
Nitrite, UA: NEGATIVE
Protein, UA: POSITIVE — AB
Spec Grav, UA: 1.01 (ref 1.010–1.025)
Urobilinogen, UA: 0.2 E.U./dL
pH, UA: 7 (ref 5.0–8.0)

## 2019-05-15 MED ORDER — CIPROFLOXACIN HCL 500 MG PO TABS: 500.0000 mg | ORAL_TABLET | Freq: Two times a day (BID) | ORAL | 0 refills | Status: AC

## 2019-05-15 MED ORDER — METOPROLOL SUCCINATE ER 50 MG PO TB24: 50.0000 mg | ORAL_TABLET | Freq: Every day | ORAL | 3 refills | Status: DC

## 2019-05-15 MED ORDER — CEFTRIAXONE SODIUM 1 G IJ SOLR
1.0000 g | Freq: Once | INTRAMUSCULAR | Status: AC
  Administered 2019-05-15: 1 g via INTRAMUSCULAR

## 2019-05-15 NOTE — Progress Notes (Signed)
Subjective:    Patient ID: [name] [name], male    DOB: [birth year], 69 y.o.   MRN: [id number]  HPI   Here with  1 wk dysuria and then started jan 6 blood clots with mild frequency and urgency and feeling warm, but no obstruction or retention.  Denies urinary symptoms such as flank pain, or n/v, chills. Was just seen per urology x 2 wks ago, no note yet forwarded I have seen, but for f/u in 6 mo.  Pt denies chest pain, increased sob or doe, wheezing, orthopnea, PND, increased LE swelling, palpitations, dizziness or syncope.   Pt denies new neurological symptoms such as new headache, or facial or extremity weakness or numbness   Pt denies polydipsia, polyuria   Past Medical History:  Diagnosis Date  . BENIGN PROSTATIC HYPERTROPHY 05/06/2010  . COMMON MIGRAINE 07/31/2007  . GERD 07/31/2007  . HYPERLIPIDEMIA 01/04/2007  . HYPERTENSION 01/04/2007  . OVERACTIVE BLADDER 03/06/2007  . Prostate cancer ([place]) 02/04/15   Past Surgical History:  Procedure Laterality Date  . COLONOSCOPY  2009    reports that he has never smoked. He has never used smokeless tobacco. He reports that he does not drink alcohol or use drugs. family history includes Heart disease (age of onset: 58) in his brother; Lung cancer (age of onset: 57) in his father. Allergies  Allergen Reactions  . Celebrex [Celecoxib] Other (See Comments)    Stroke like symptoms  . Crestor [Rosuvastatin]     weakness  . Detrol [Tolterodine] Other (See Comments)    headache  . Ditropan [Oxybutynin] Other (See Comments)    Dry mouth  . Norvasc [Amlodipine Besylate] Swelling  . Tadalafil Other (See Comments)    headache   Current Outpatient Medications on File Prior to Visit  Medication Sig Dispense Refill  . aspirin 81 MG tablet Take 81 mg by mouth daily.     Marland Kitchen atorvastatin (LIPITOR) 20 MG tablet Take 1 tablet (20 mg total) by mouth daily. 90 tablet 3  . benazepril (LOTENSIN) 40 MG tablet Take 0.5 tablets (20 mg total) by mouth daily. 45  tablet 3  . clonazePAM (KLONOPIN) 0.5 MG tablet Take 1 tablet (0.5 mg total) by mouth 2 (two) times daily as needed for anxiety. 60 tablet 1  . latanoprost (XALATAN) 0.005 % ophthalmic solution Place 1 drop into the right eye daily.  1  . polyethylene glycol (MIRALAX / GLYCOLAX) packet Take 17 g by mouth as needed.     . sildenafil (REVATIO) 20 MG tablet TAKE 3 TABLETS BY MOUTH EVERY DAY AS NEEDED 90 tablet 11  . Tamsulosin HCl (FLOMAX) 0.4 MG CAPS Take 1 capsule (0.4 mg total) by mouth daily. 30 capsule 11  . Vitamin D, Ergocalciferol, (DRISDOL) 1.25 MG (50000 UT) CAPS capsule Take 1 capsule (50,000 Units total) by mouth every 7 (seven) days. 12 capsule 0   No current facility-administered medications on file prior to visit.   Review of Systems  Constitutional: Negative for other unusual diaphoresis or sweats HENT: Negative for ear discharge or swelling Eyes: Negative for other worsening visual disturbances Respiratory: Negative for stridor or other swelling  Gastrointestinal: Negative for worsening distension or other blood Genitourinary: Negative for retention or other urinary change Musculoskeletal: Negative for other MSK pain or swelling Skin: Negative for color change or other new lesions Neurological: Negative for worsening tremors and other numbness  Psychiatric/Behavioral: Negative for worsening agitation or other fatigue All otherwise neg per pt     Objective:  Physical Exam BP (!) 156/90   Pulse 77   Temp 98.9 F (37.2 C)   Resp 12   Ht 5\' 7"  (1.702 m)   Wt 206 lb (93.4 kg)   SpO2 99%   BMI 32.26 kg/m  Mild ill appearing VS noted,  Constitutional: Pt appears in NAD HENT: Head: NCAT.  Right Ear: External ear normal.  Left Ear: External ear normal.  Eyes: . Pupils are equal, round, and reactive to light. Conjunctivae and EOM are normal Nose: without d/c or deformity Neck: Neck supple. Gross normal ROM Cardiovascular: Normal rate and regular rhythm.    Pulmonary/Chest: Effort normal and breath sounds without rales or wheezing.  Abd:  Soft, NT, ND, + BS, no organomegaly Neurological: Pt is alert. At baseline orientation, motor grossly intact Skin: Skin is warm. No rashes, other new lesions, no LE edema Psychiatric: Pt behavior is normal without agitation  All otherwise neg per pt Lab Results  Component Value Date   WBC 8.1 01/14/2019   HGB 15.4 01/14/2019   HCT 45.9 01/14/2019   PLT 248.0 01/14/2019   GLUCOSE 116 (H) 01/14/2019   CHOL 173 09/25/2018   TRIG 88.0 09/25/2018   HDL 60.00 09/25/2018   LDLDIRECT 139.2 10/29/2012   LDLCALC 95 09/25/2018   ALT 25 09/25/2018   AST 21 09/25/2018   NA 142 01/14/2019   K 4.0 01/14/2019   CL 104 01/14/2019   CREATININE 1.08 01/14/2019   BUN 11 01/14/2019   CO2 29 01/14/2019   TSH 0.99 01/14/2019   PSA 0.23 03/28/2019   INR 1.0 06/24/2007   HGBA1C 6.3 01/14/2019      Assessment & Plan:

## 2019-05-15 NOTE — Patient Instructions (Addendum)
You had the antibiotic shot today - rocephin 1 gm  Please take all new medication as prescribed - the cipro, and the toprol xl 50 mg per day for BP  Your specimen will be sent for analysis and culture  Please continue all other medications as before, and refills have been done if requested.  Please have the pharmacy call with any other refills you may need.  Please keep your appointments with your specialists as you may have planned - urology in 24mo (although if the culture is negative, you should be seen sooner)

## 2019-05-15 NOTE — Assessment & Plan Note (Addendum)
5 days onset with 1 days small clots gross hematuria, high suspicion for uti o/w uncomplicated - for rocephin 1 gm then cipro course, for UA and CX, but will need urology f/u sooner than 6 mo if culture neg

## 2019-05-16 LAB — URINE CULTURE: Result:: NO GROWTH

## 2019-05-17 ENCOUNTER — Other Ambulatory Visit: Payer: Self-pay | Admitting: Internal Medicine

## 2019-05-17 DIAGNOSIS — R310 Gross hematuria: Secondary | ICD-10-CM

## 2019-05-18 ENCOUNTER — Encounter: Payer: Self-pay | Admitting: Internal Medicine

## 2019-05-18 NOTE — Assessment & Plan Note (Signed)
stable overall by history and exam, recent data reviewed with pt, and pt to continue medical treatment as before,  to f/u any worsening symptoms or concerns  

## 2019-05-28 ENCOUNTER — Ambulatory Visit: Payer: Medicare Other | Admitting: Cardiology

## 2019-06-03 DIAGNOSIS — N5201 Erectile dysfunction due to arterial insufficiency: Secondary | ICD-10-CM | POA: Diagnosis not present

## 2019-06-03 DIAGNOSIS — C61 Malignant neoplasm of prostate: Secondary | ICD-10-CM | POA: Diagnosis not present

## 2019-06-03 DIAGNOSIS — R310 Gross hematuria: Secondary | ICD-10-CM | POA: Diagnosis not present

## 2019-06-03 DIAGNOSIS — N401 Enlarged prostate with lower urinary tract symptoms: Secondary | ICD-10-CM | POA: Diagnosis not present

## 2019-06-03 DIAGNOSIS — R351 Nocturia: Secondary | ICD-10-CM | POA: Diagnosis not present

## 2019-07-17 DIAGNOSIS — R310 Gross hematuria: Secondary | ICD-10-CM | POA: Diagnosis not present

## 2019-07-17 DIAGNOSIS — R351 Nocturia: Secondary | ICD-10-CM | POA: Diagnosis not present

## 2019-07-17 DIAGNOSIS — C61 Malignant neoplasm of prostate: Secondary | ICD-10-CM | POA: Diagnosis not present

## 2019-07-30 DIAGNOSIS — R310 Gross hematuria: Secondary | ICD-10-CM | POA: Diagnosis not present

## 2019-07-30 DIAGNOSIS — N281 Cyst of kidney, acquired: Secondary | ICD-10-CM | POA: Diagnosis not present

## 2019-09-11 ENCOUNTER — Other Ambulatory Visit: Payer: Self-pay | Admitting: Internal Medicine

## 2019-09-11 NOTE — Telephone Encounter (Signed)
Please refill as per office routine med refill policy (all routine meds refilled for 3 mo or monthly per pt preference up to one year from last visit, then month to month grace period for 3 mo, then further med refills will have to be denied)  

## 2019-09-25 ENCOUNTER — Other Ambulatory Visit: Payer: Self-pay

## 2019-09-25 ENCOUNTER — Ambulatory Visit (INDEPENDENT_AMBULATORY_CARE_PROVIDER_SITE_OTHER): Payer: Medicare Other | Admitting: Internal Medicine

## 2019-09-25 ENCOUNTER — Encounter: Payer: Self-pay | Admitting: Internal Medicine

## 2019-09-25 VITALS — BP 150/80 | HR 63 | Temp 98.2°F | Ht 67.0 in | Wt 207.0 lb

## 2019-09-25 DIAGNOSIS — E7800 Pure hypercholesterolemia, unspecified: Secondary | ICD-10-CM

## 2019-09-25 DIAGNOSIS — E559 Vitamin D deficiency, unspecified: Secondary | ICD-10-CM

## 2019-09-25 DIAGNOSIS — R739 Hyperglycemia, unspecified: Secondary | ICD-10-CM

## 2019-09-25 DIAGNOSIS — R0989 Other specified symptoms and signs involving the circulatory and respiratory systems: Secondary | ICD-10-CM | POA: Diagnosis not present

## 2019-09-25 MED ORDER — HYDROCHLOROTHIAZIDE 12.5 MG PO CAPS: 12.5000 mg | ORAL_CAPSULE | Freq: Every day | ORAL | 3 refills | Status: DC

## 2019-09-25 MED ORDER — BENAZEPRIL HCL 40 MG PO TABS: 40.0000 mg | ORAL_TABLET | Freq: Every day | ORAL | 3 refills | Status: DC

## 2019-09-25 MED ORDER — AMLODIPINE BESYLATE 5 MG PO TABS: 5.0000 mg | ORAL_TABLET | Freq: Every day | ORAL | 3 refills | Status: DC

## 2019-09-25 NOTE — Patient Instructions (Signed)
Ok to keep the benazepril at 40 mg per day (the whole pill)  Please take all new medication as prescribed - the fluid pill HCT 12.5 mg per day  Ok to restart the the amlodipine 5 mg per day, as you have not been able to take the 10 mg due to swelling  Ok to stop the toprol as you have  Please continue all other medications as before, and refills have been done if requested.  Please have the pharmacy call with any other refills you may need.  Please continue your efforts at being more active, low cholesterol diet, and weight control.  Please keep your appointments with your specialists as you may have planned  Please make an Appointment to return in 6 months, or sooner if needed

## 2019-09-25 NOTE — Progress Notes (Signed)
Subjective:    Patient ID: [name] [name], male    DOB: July 25, 1950, 69 y.o.   MRN: [id number]  HPI  Here to f/u; overall doing ok,  Pt denies chest pain, increasing sob or doe, wheezing, orthopnea, PND, increased LE swelling, palpitations, dizziness or syncope.  Pt denies new neurological symptoms such as new headache, or facial or extremity weakness or numbness.  Pt denies polydipsia, polyuria, or low sugar episode.  Pt states overall good compliance with meds, mostly trying to follow appropriate diet, with wt overall stable,  but little exercise however. SBP still running about 150-160 at home, not taking toprol due to dizziness.  Wt Readings from Last 3 Encounters:  09/25/19 207 lb (93.9 kg)  05/15/19 206 lb (93.4 kg)  03/28/19 201 lb (91.2 kg)   Past Medical History:  Diagnosis Date  . BENIGN PROSTATIC HYPERTROPHY 05/06/2010  . COMMON MIGRAINE 07/31/2007  . GERD 07/31/2007  . HYPERLIPIDEMIA 01/04/2007  . HYPERTENSION 01/04/2007  . OVERACTIVE BLADDER 03/06/2007  . Prostate cancer ([place]) 02/04/15   Past Surgical History:  Procedure Laterality Date  . COLONOSCOPY  2009    reports that he has never smoked. He has never used smokeless tobacco. He reports that he does not drink alcohol or use drugs. family history includes Heart disease (age of onset: 54) in his brother; Lung cancer (age of onset: 92) in his father. Allergies  Allergen Reactions  . Celebrex [Celecoxib] Other (See Comments)    Stroke like symptoms  . Crestor [Rosuvastatin]     weakness  . Detrol [Tolterodine] Other (See Comments)    headache  . Ditropan [Oxybutynin] Other (See Comments)    Dry mouth  . Norvasc [Amlodipine Besylate] Swelling  . Tadalafil Other (See Comments)    headache  . Toprol Xl [Metoprolol] Other (See Comments)    dizzy   Current Outpatient Medications on File Prior to Visit  Medication Sig Dispense Refill  . aspirin 81 MG tablet Take 81 mg by mouth daily.     Marland Kitchen atorvastatin (LIPITOR) 20 MG  tablet TAKE 1 TABLET BY MOUTH EVERY DAY 90 tablet 3  . clonazePAM (KLONOPIN) 0.5 MG tablet Take 1 tablet (0.5 mg total) by mouth 2 (two) times daily as needed for anxiety. 60 tablet 1  . latanoprost (XALATAN) 0.005 % ophthalmic solution Place 1 drop into the right eye daily.  1  . polyethylene glycol (MIRALAX / GLYCOLAX) packet Take 17 g by mouth as needed.     . sildenafil (REVATIO) 20 MG tablet TAKE 3 TABLETS BY MOUTH EVERY DAY AS NEEDED 90 tablet 11  . SYSTANE ULTRA 0.4-0.3 % SOLN SMARTSIG:1 Drop(s) In Eye(s) PRN    . Tamsulosin HCl (FLOMAX) 0.4 MG CAPS Take 1 capsule (0.4 mg total) by mouth daily. 30 capsule 11  . Vitamin D, Ergocalciferol, (DRISDOL) 1.25 MG (50000 UT) CAPS capsule Take 1 capsule (50,000 Units total) by mouth every 7 (seven) days. 12 capsule 0   No current facility-administered medications on file prior to visit.   Review of Systems All otherwise neg per pt    Objective:   Physical Exam BP (!) 150/80 (BP Location: Left Arm, Patient Position: Sitting, Cuff Size: Large)   Pulse 63   Temp 98.2 F (36.8 C) (Oral)   Ht 5\' 7"  (1.702 m)   Wt 207 lb (93.9 kg)   SpO2 98%   BMI 32.42 kg/m  VS noted,  Constitutional: Pt appears in NAD HENT: Head: NCAT.  Right Ear: External  ear normal.  Left Ear: External ear normal.  Eyes: . Pupils are equal, round, and reactive to light. Conjunctivae and EOM are normal Nose: without d/c or deformity Neck: Neck supple. Gross normal ROM Cardiovascular: Normal rate and regular rhythm.   Pulmonary/Chest: Effort normal and breath sounds without rales or wheezing.  Abd:  Soft, NT, ND, + BS, no organomegaly Neurological: Pt is alert. At baseline orientation, motor grossly intact Skin: Skin is warm. No rashes, other new lesions, no LE edema Psychiatric: Pt behavior is normal without agitation  All otherwise neg per pt Lab Results  Component Value Date   WBC 8.1 01/14/2019   HGB 15.4 01/14/2019   HCT 45.9 01/14/2019   PLT 248.0  01/14/2019   GLUCOSE 116 (H) 01/14/2019   CHOL 173 09/25/2018   TRIG 88.0 09/25/2018   HDL 60.00 09/25/2018   LDLDIRECT 139.2 10/29/2012   LDLCALC 95 09/25/2018   ALT 25 09/25/2018   AST 21 09/25/2018   NA 142 01/14/2019   K 4.0 01/14/2019   CL 104 01/14/2019   CREATININE 1.08 01/14/2019   BUN 11 01/14/2019   CO2 29 01/14/2019   TSH 0.99 01/14/2019   PSA 0.23 03/28/2019   INR 1.0 06/24/2007   HGBA1C 6.3 01/14/2019      Assessment & Plan:

## 2019-09-27 ENCOUNTER — Encounter: Payer: Self-pay | Admitting: Internal Medicine

## 2019-09-27 NOTE — Assessment & Plan Note (Addendum)
Uncontrolled, resistant, for increased benicar 40 mg, add hct 12.5 (not taking) and restart amlod 5 qd  I spent 31 minutes in preparing to see the patient by review of recent labs, imaging and procedures, obtaining and reviewing separately obtained history, communicating with the patient and family or caregiver, ordering medications, tests or procedures, and documenting clinical information in the EHR including the differential Dx, treatment, and any further evaluation and other management of htn, hyperglycemia, hld

## 2019-09-27 NOTE — Assessment & Plan Note (Signed)
stable overall by history and exam, recent data reviewed with pt, and pt to continue medical treatment as before,  to f/u any worsening symptoms or concerns  

## 2019-10-09 ENCOUNTER — Encounter: Payer: Self-pay | Admitting: Internal Medicine

## 2019-10-31 DIAGNOSIS — C61 Malignant neoplasm of prostate: Secondary | ICD-10-CM | POA: Diagnosis not present

## 2019-11-12 DIAGNOSIS — H11153 Pinguecula, bilateral: Secondary | ICD-10-CM | POA: Diagnosis not present

## 2019-11-12 DIAGNOSIS — H0102B Squamous blepharitis left eye, upper and lower eyelids: Secondary | ICD-10-CM | POA: Diagnosis not present

## 2019-11-12 DIAGNOSIS — H18413 Arcus senilis, bilateral: Secondary | ICD-10-CM | POA: Diagnosis not present

## 2019-11-12 DIAGNOSIS — H401131 Primary open-angle glaucoma, bilateral, mild stage: Secondary | ICD-10-CM | POA: Diagnosis not present

## 2019-11-12 DIAGNOSIS — H16223 Keratoconjunctivitis sicca, not specified as Sjogren's, bilateral: Secondary | ICD-10-CM | POA: Diagnosis not present

## 2019-11-12 DIAGNOSIS — H0102A Squamous blepharitis right eye, upper and lower eyelids: Secondary | ICD-10-CM | POA: Diagnosis not present

## 2019-11-12 DIAGNOSIS — H35373 Puckering of macula, bilateral: Secondary | ICD-10-CM | POA: Diagnosis not present

## 2019-11-12 DIAGNOSIS — Z9889 Other specified postprocedural states: Secondary | ICD-10-CM | POA: Diagnosis not present

## 2019-11-12 IMAGING — DX DG CHEST 2V
2 series · 2 of 2 positions shown · non-contrast
Comparison: 05/14/2018

CLINICAL DATA: Cough and shortness of breath for several weeks

EXAM:
CHEST - 2 VIEW

[chest pa]
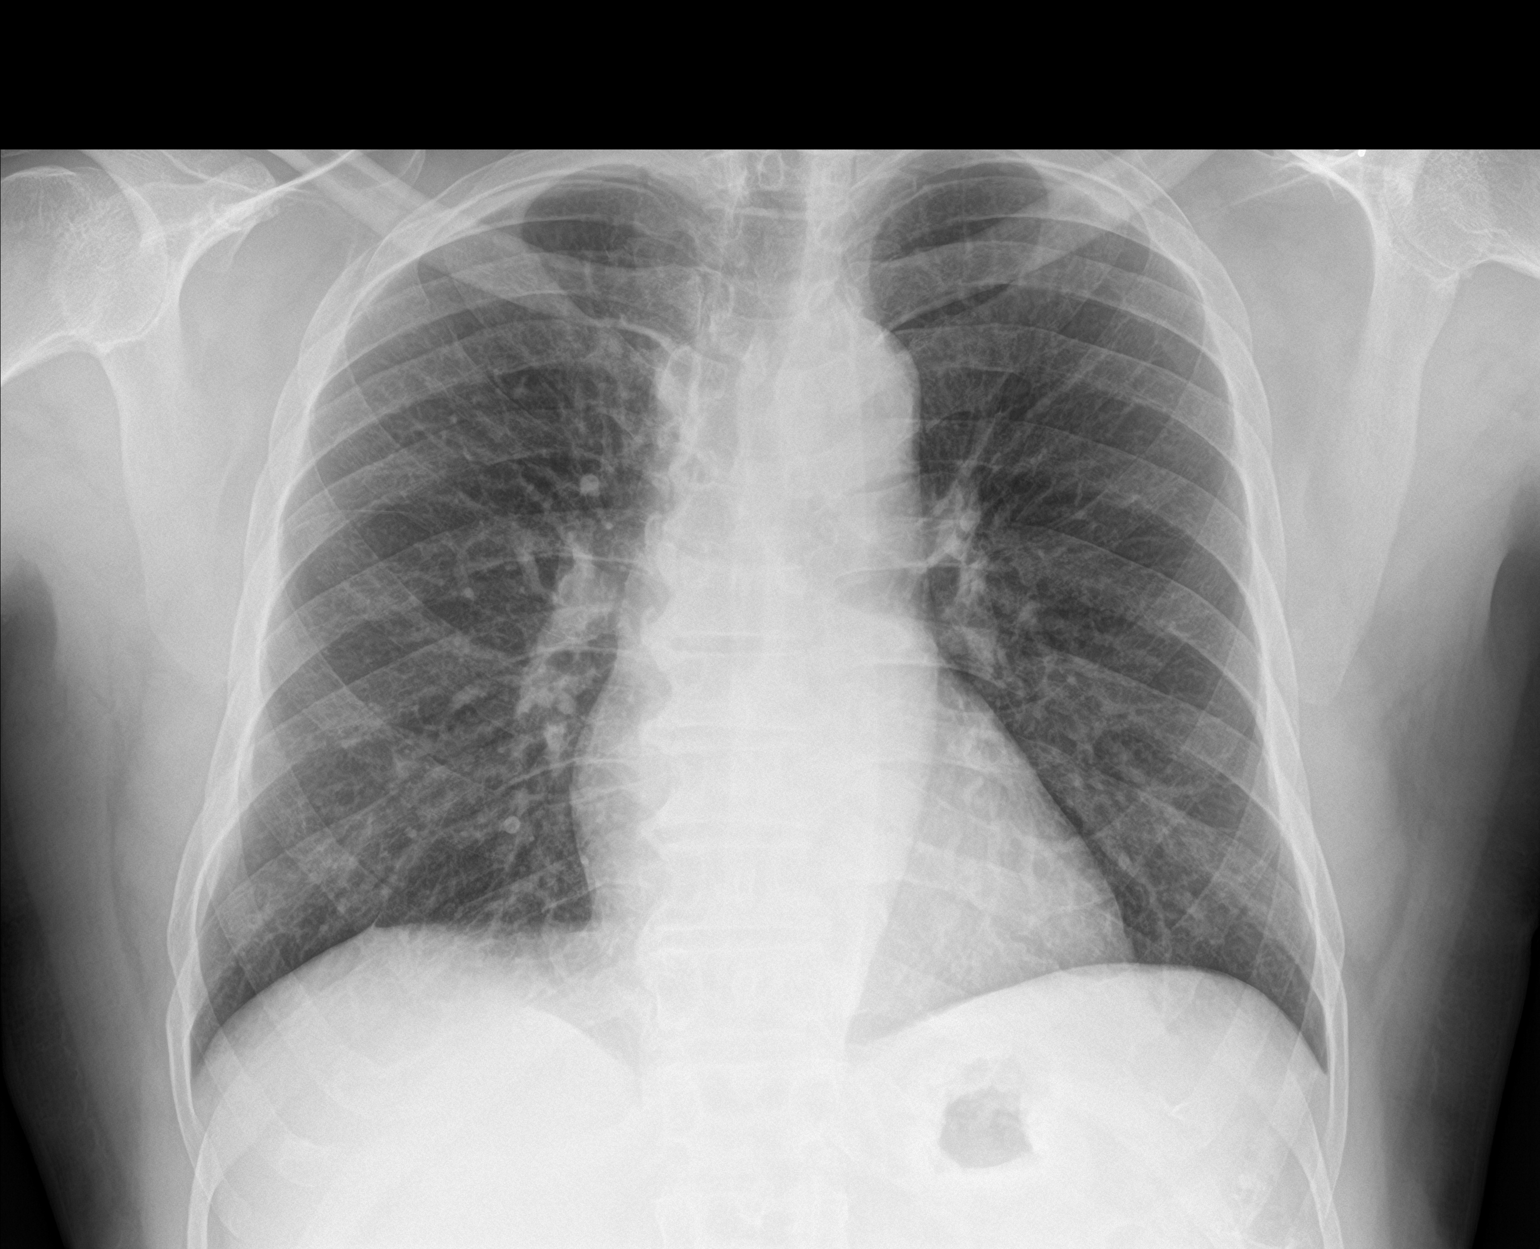

[chest lat]
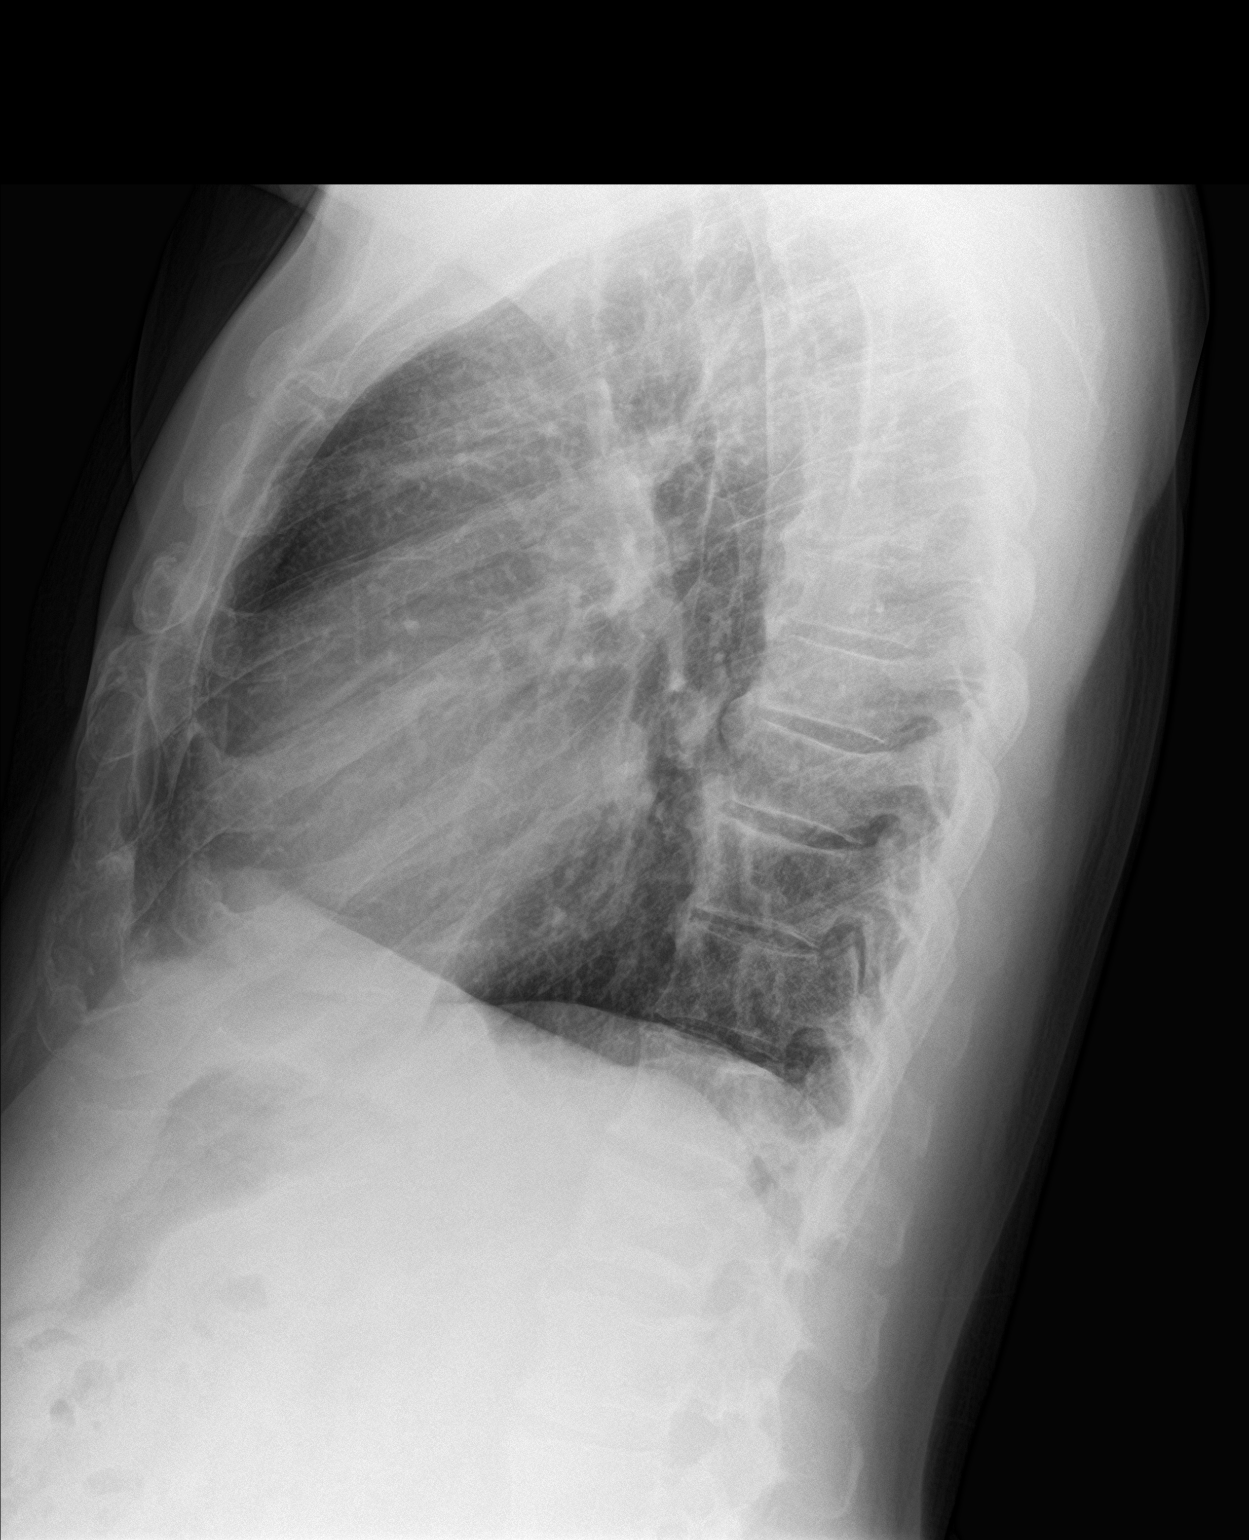

[2 of 2 positions shown; findings below may reference images not displayed]

FINDINGS: The heart size and mediastinal contours are within normal limits.
Both lungs are clear. The visualized skeletal structures are
unremarkable.
IMPRESSION: No active cardiopulmonary disease.

## 2019-11-16 ENCOUNTER — Other Ambulatory Visit: Payer: Self-pay | Admitting: Internal Medicine

## 2019-11-16 NOTE — Telephone Encounter (Signed)
Please refill as per office routine med refill policy (all routine meds refilled for 3 mo or monthly per pt preference up to one year from last visit, then month to month grace period for 3 mo, then further med refills will have to be denied)  

## 2020-01-01 ENCOUNTER — Encounter: Payer: Self-pay | Admitting: Internal Medicine

## 2020-01-21 DIAGNOSIS — Z23 Encounter for immunization: Secondary | ICD-10-CM | POA: Diagnosis not present

## 2020-02-12 DIAGNOSIS — Z23 Encounter for immunization: Secondary | ICD-10-CM | POA: Diagnosis not present

## 2020-02-13 DIAGNOSIS — I10 Essential (primary) hypertension: Secondary | ICD-10-CM | POA: Diagnosis not present

## 2020-02-13 DIAGNOSIS — H16223 Keratoconjunctivitis sicca, not specified as Sjogren's, bilateral: Secondary | ICD-10-CM | POA: Diagnosis not present

## 2020-02-13 DIAGNOSIS — H35373 Puckering of macula, bilateral: Secondary | ICD-10-CM | POA: Diagnosis not present

## 2020-02-13 DIAGNOSIS — H0102A Squamous blepharitis right eye, upper and lower eyelids: Secondary | ICD-10-CM | POA: Diagnosis not present

## 2020-02-13 DIAGNOSIS — H0102B Squamous blepharitis left eye, upper and lower eyelids: Secondary | ICD-10-CM | POA: Diagnosis not present

## 2020-02-13 DIAGNOSIS — H401131 Primary open-angle glaucoma, bilateral, mild stage: Secondary | ICD-10-CM | POA: Diagnosis not present

## 2020-02-18 ENCOUNTER — Encounter: Payer: Self-pay | Admitting: Internal Medicine

## 2020-02-19 MED ORDER — BENAZEPRIL HCL 20 MG PO TABS: 20.0000 mg | ORAL_TABLET | Freq: Every day | ORAL | 3 refills | Status: DC

## 2020-04-19 DIAGNOSIS — C61 Malignant neoplasm of prostate: Secondary | ICD-10-CM | POA: Diagnosis not present

## 2020-04-22 ENCOUNTER — Other Ambulatory Visit: Payer: Self-pay | Admitting: Internal Medicine

## 2020-04-26 DIAGNOSIS — C61 Malignant neoplasm of prostate: Secondary | ICD-10-CM | POA: Diagnosis not present

## 2020-04-26 DIAGNOSIS — R3129 Other microscopic hematuria: Secondary | ICD-10-CM | POA: Diagnosis not present

## 2020-04-26 DIAGNOSIS — N401 Enlarged prostate with lower urinary tract symptoms: Secondary | ICD-10-CM | POA: Diagnosis not present

## 2020-04-26 DIAGNOSIS — R351 Nocturia: Secondary | ICD-10-CM | POA: Diagnosis not present

## 2020-05-17 DIAGNOSIS — H16223 Keratoconjunctivitis sicca, not specified as Sjogren's, bilateral: Secondary | ICD-10-CM | POA: Diagnosis not present

## 2020-05-17 DIAGNOSIS — H02836 Dermatochalasis of left eye, unspecified eyelid: Secondary | ICD-10-CM | POA: Diagnosis not present

## 2020-05-17 DIAGNOSIS — H0102A Squamous blepharitis right eye, upper and lower eyelids: Secondary | ICD-10-CM | POA: Diagnosis not present

## 2020-05-17 DIAGNOSIS — H02833 Dermatochalasis of right eye, unspecified eyelid: Secondary | ICD-10-CM | POA: Diagnosis not present

## 2020-05-17 DIAGNOSIS — H0102B Squamous blepharitis left eye, upper and lower eyelids: Secondary | ICD-10-CM | POA: Diagnosis not present

## 2020-05-17 DIAGNOSIS — H35373 Puckering of macula, bilateral: Secondary | ICD-10-CM | POA: Diagnosis not present

## 2020-05-17 DIAGNOSIS — H401131 Primary open-angle glaucoma, bilateral, mild stage: Secondary | ICD-10-CM | POA: Diagnosis not present

## 2020-08-17 DIAGNOSIS — H0102A Squamous blepharitis right eye, upper and lower eyelids: Secondary | ICD-10-CM | POA: Diagnosis not present

## 2020-08-17 DIAGNOSIS — H16223 Keratoconjunctivitis sicca, not specified as Sjogren's, bilateral: Secondary | ICD-10-CM | POA: Diagnosis not present

## 2020-08-17 DIAGNOSIS — H35373 Puckering of macula, bilateral: Secondary | ICD-10-CM | POA: Diagnosis not present

## 2020-08-17 DIAGNOSIS — H401131 Primary open-angle glaucoma, bilateral, mild stage: Secondary | ICD-10-CM | POA: Diagnosis not present

## 2020-08-17 DIAGNOSIS — H0102B Squamous blepharitis left eye, upper and lower eyelids: Secondary | ICD-10-CM | POA: Diagnosis not present

## 2020-08-17 DIAGNOSIS — H02836 Dermatochalasis of left eye, unspecified eyelid: Secondary | ICD-10-CM | POA: Diagnosis not present

## 2020-08-17 DIAGNOSIS — H02833 Dermatochalasis of right eye, unspecified eyelid: Secondary | ICD-10-CM | POA: Diagnosis not present

## 2020-08-22 ENCOUNTER — Other Ambulatory Visit: Payer: Self-pay | Admitting: Internal Medicine

## 2020-08-22 NOTE — Telephone Encounter (Signed)
Please refill as per office routine med refill policy (all routine meds refilled for 3 mo or monthly per pt preference up to one year from last visit, then month to month grace period for 3 mo, then further med refills will have to be denied)  

## 2020-09-06 ENCOUNTER — Other Ambulatory Visit: Payer: Self-pay | Admitting: Internal Medicine

## 2020-09-06 NOTE — Telephone Encounter (Signed)
Please refill as per office routine med refill policy (all routine meds refilled for 3 mo or monthly per pt preference up to one year from last visit, then month to month grace period for 3 mo, then further med refills will have to be denied)  

## 2020-09-25 ENCOUNTER — Other Ambulatory Visit: Payer: Self-pay | Admitting: Internal Medicine

## 2020-09-25 NOTE — Telephone Encounter (Signed)
Please refill as per office routine med refill policy (all routine meds refilled for 3 mo or monthly per pt preference up to one year from last visit, then month to month grace period for 3 mo, then further med refills will have to be denied)  

## 2020-09-26 ENCOUNTER — Ambulatory Visit (HOSPITAL_COMMUNITY)
Admission: EM | Admit: 2020-09-26 | Discharge: 2020-09-26 | Disposition: A | Discharge status: Home / Self Care | Payer: Medicare Other | Attending: Sports Medicine | Admitting: Sports Medicine

## 2020-09-26 ENCOUNTER — Encounter (HOSPITAL_COMMUNITY): Payer: Self-pay | Admitting: Emergency Medicine

## 2020-09-26 ENCOUNTER — Other Ambulatory Visit: Payer: Self-pay

## 2020-09-26 DIAGNOSIS — T2691XA Corrosion of right eye and adnexa, part unspecified, initial encounter: Secondary | ICD-10-CM

## 2020-09-26 DIAGNOSIS — H0289 Other specified disorders of eyelid: Secondary | ICD-10-CM

## 2020-09-26 DIAGNOSIS — H02843 Edema of right eye, unspecified eyelid: Secondary | ICD-10-CM

## 2020-09-26 DIAGNOSIS — H5789 Other specified disorders of eye and adnexa: Secondary | ICD-10-CM

## 2020-09-26 MED ORDER — POLYMYXIN B-TRIMETHOPRIM 10000-0.1 UNIT/ML-% OP SOLN: 1.0000 [drp] | OPHTHALMIC | 0 refills | Status: DC

## 2020-09-26 NOTE — ED Triage Notes (Addendum)
Patient reports he splashed rubbing alcohol in right eye last night.  States vision slightly blurry.  Right eye is hurting.  Patient reports washing right eye out with tap water.  Feels like something in eye.  Right eye is red

## 2020-09-26 NOTE — ED Provider Notes (Signed)
[place]    CSN: [id number] Arrival date & time: 09/26/20  1007      History   Chief Complaint Chief Complaint  Patient presents with  . Eye Problem    HPI [name] [name] is a 70 y.o. male.   70 year old male presents for evaluation of the above issues.  He reports that his wife was attempting to put some alcohol on an insect bite on his right upper arm and some of it splashed into his right eye.  It was rubbing alcohol.  He has been flushing it most of the night.  He does have some discomfort in his right eye.  He also states his vision is somewhat blurry.  When I questioned him he says he has no foreign body sensation.  His eyelids are hurting him.  There is a little bit of swelling.  He also has some redness and some watery discharge from his right eye.  No photophobia.     Past Medical History:  Diagnosis Date  . BENIGN PROSTATIC HYPERTROPHY 05/06/2010  . COMMON MIGRAINE 07/31/2007  . GERD 07/31/2007  . HYPERLIPIDEMIA 01/04/2007  . HYPERTENSION 01/04/2007  . OVERACTIVE BLADDER 03/06/2007  . Prostate cancer ([place]) 02/04/15    Patient Active Problem List   Diagnosis Date Noted  . Vitamin D deficiency 09/25/2019  . Dysuria 05/15/2019  . Palpitation 02/25/2019  . Paroxysmal tachycardia ([place]) 02/25/2019  . Tachycardia 01/14/2019  . Generalized anxiety disorder with panic attacks 09/25/2018  . Mid back pain on right side 05/14/2018  . Hyperglycemia 03/26/2018  . Low back pain 04/19/2017  . Lightheadedness 10/26/2016  . Eustachian tube dysfunction, right 08/17/2016  . Allergic rhinitis 08/17/2016  . Erectile dysfunction 08/17/2016  . Diastolic dysfunction 26/94/8546  . Syncope 04/04/2016  . Weakness 03/26/2015  . Malignant neoplasm of prostate ([place]) 02/23/2015  . Chest pain 10/01/2013  . Cough 08/04/2013  . Asthmatic bronchitis 07/15/2013  . Microhematuria 05/09/2012  . Constipation 08/13/2011  . Ventral hernia 08/13/2011  . Umbilical hernia  27/07/5007  . Preventative health care 04/25/2011  . Nocturia 06/29/2010  . BENIGN PROSTATIC HYPERTROPHY 05/06/2010  . BACK PAIN 05/06/2010  . SKIN LESION 11/30/2008  . FREQUENCY, URINARY 11/30/2008  . COMMON MIGRAINE 07/31/2007  . GERD 07/31/2007  . OVERACTIVE BLADDER 03/06/2007  . Hypercholesterolemia 01/04/2007  . Labile hypertension 01/04/2007    Past Surgical History:  Procedure Laterality Date  . COLONOSCOPY  2009       Home Medications    Prior to Admission medications   Medication Sig Start Date End Date Taking? Authorizing Provider  trimethoprim-polymyxin b (POLYTRIM) ophthalmic solution Place 1 drop into the right eye every 4 (four) hours. 09/26/20  Yes Verda Cumins, MD  amLODipine (NORVASC) 5 MG tablet TAKE 1 TABLET BY MOUTH EVERY DAY 09/06/20   Biagio Borg, MD  aspirin 81 MG tablet Take 81 mg by mouth daily.     [provider]  atorvastatin (LIPITOR) 20 MG tablet TAKE 1 TABLET BY MOUTH EVERY DAY 08/23/20   Biagio Borg, MD  benazepril (LOTENSIN) 20 MG tablet Take 1 tablet (20 mg total) by mouth daily. 02/19/20   Biagio Borg, MD  clonazePAM (KLONOPIN) 0.5 MG tablet Take 1 tablet (0.5 mg total) by mouth 2 (two) times daily as needed for anxiety. 09/25/18   Biagio Borg, MD  hydrochlorothiazide (MICROZIDE) 12.5 MG capsule Take 1 capsule (12.5 mg total) by mouth daily. Patient not taking: Reported on 09/26/2020 09/25/19  Biagio Borg, MD  latanoprost (XALATAN) 0.005 % ophthalmic solution Place 1 drop into the right eye daily. 11/09/17   [provider]  polyethylene glycol (MIRALAX / GLYCOLAX) packet Take 17 g by mouth as needed.     [provider]  sildenafil (REVATIO) 20 MG tablet TAKE THREE TABLETS BY MOUTH DAILY AS NEEDED 04/22/20   Biagio Borg, MD  SYSTANE ULTRA 0.4-0.3 % SOLN SMARTSIG:1 Drop(s) In Eye(s) PRN 08/14/19   [provider]  Tamsulosin HCl (FLOMAX) 0.4 MG CAPS Take 1 capsule (0.4 mg total) by mouth daily. 05/08/11    Biagio Borg, MD  Vitamin D, Ergocalciferol, (DRISDOL) 1.25 MG (50000 UT) CAPS capsule Take 1 capsule (50,000 Units total) by mouth every 7 (seven) days. 09/25/18   Biagio Borg, MD    Family History Family History  Problem Relation Age of Onset  . Lung cancer Father 41  . Heart disease Brother 98       died from MI   . Colon cancer Neg Hx   . Esophageal cancer Neg Hx   . Rectal cancer Neg Hx   . Stomach cancer Neg Hx     Social History Social History   Tobacco Use  . Smoking status: Never Smoker  . Smokeless tobacco: Never Used  Vaping Use  . Vaping Use: Never used  Substance Use Topics  . Alcohol use: No    Alcohol/week: 0.0 standard drinks  . Drug use: No     Allergies   Celebrex [celecoxib], Crestor [rosuvastatin], Detrol [tolterodine], Ditropan [oxybutynin], Norvasc [amlodipine besylate], Tadalafil, and Toprol xl [metoprolol]   Review of Systems Review of Systems  Constitutional: Negative for appetite change, chills, diaphoresis, fatigue and fever.  HENT: Negative for congestion, ear pain, postnasal drip, rhinorrhea, sinus pressure, sinus pain, sneezing and sore throat.   Eyes: Positive for pain, discharge, redness and visual disturbance. Negative for photophobia and itching.  Respiratory: Negative for cough, chest tightness and shortness of breath.   Cardiovascular: Negative for chest pain and palpitations.  Gastrointestinal: Negative for abdominal pain, diarrhea, nausea and vomiting.  Genitourinary: Negative for dysuria.  Musculoskeletal: Negative for back pain, myalgias and neck pain.  Skin: Negative for color change, pallor, rash and wound.  Neurological: Negative for dizziness, light-headedness and headaches.  All other systems reviewed and are negative.    Physical Exam Triage Vital Signs ED Triage Vitals [09/26/20 1032]  Enc Vitals Group     BP      Pulse      Resp      Temp      Temp src      SpO2      Weight      Height      Head  Circumference      Peak Flow      Pain Score 4     Pain Loc      Pain Edu?      Excl. in [place]?    No data found.  Updated Vital Signs BP (!) 174/95 (BP Location: Right Arm)   Pulse 70   Temp 98 F (36.7 C) (Oral)   Resp 20   SpO2 97%   Visual Acuity Right Eye Distance:   Left Eye Distance:   Bilateral Distance:    Right Eye Near:   Left Eye Near:    Bilateral Near:     Physical Exam Vitals and nursing note reviewed.  Constitutional:      General: He is  not in acute distress.    Appearance: Normal appearance. He is not ill-appearing, toxic-appearing or diaphoretic.  HENT:     Head: Normocephalic and atraumatic.     Nose: Nose normal.     Mouth/Throat:     Mouth: Mucous membranes are moist.  Eyes:     General: No scleral icterus.       Right eye: Discharge present. No foreign body or hordeolum.        Left eye: No discharge.     Extraocular Movements: Extraocular movements intact.     Right eye: Normal extraocular motion and no nystagmus.     Left eye: Normal extraocular motion and no nystagmus.     Conjunctiva/sclera:     Right eye: Right conjunctiva is injected. No hemorrhage.    Left eye: Left conjunctiva is not injected. No hemorrhage.    Pupils: Pupils are equal, round, and reactive to light.     Comments: Minimal swelling of both the upper and lower eyelids on the right. Fluorescein exam was done in the office.  Verbal consent was obtained.  Eye was anesthetized.  Examination did not reveal a foreign body, corneal abrasion, or epithelial defect that I could visualize.  Cardiovascular:     Rate and Rhythm: Normal rate and regular rhythm.     Pulses: Normal pulses.     Heart sounds: Normal heart sounds. No murmur heard. No friction rub. No gallop.   Pulmonary:     Effort: Pulmonary effort is normal. No respiratory distress.     Breath sounds: Normal breath sounds. No stridor. No wheezing, rhonchi or rales.  Musculoskeletal:     Cervical back: Normal range of  motion and neck supple.  Skin:    General: Skin is warm and dry.     Capillary Refill: Capillary refill takes less than 2 seconds.  Neurological:     General: No focal deficit present.     Mental Status: He is alert and oriented to person, place, and time.      UC Treatments / Results  Labs (all labs ordered are listed, but only abnormal results are displayed) Labs Reviewed - No data to display  EKG   Radiology No results found.  Procedures Procedures (including critical care time)  Medications Ordered in UC Medications - No data to display  Initial Impression / Assessment and Plan / UC Course  I have reviewed the triage vital signs and the nursing notes.  Pertinent labs & imaging results that were available during my care of the patient were reviewed by me and considered in my medical decision making (see chart for details).  Clinical impression: Right eye irritation with injection of the conjunctiva and a clear watery discharge secondary to rubbing alcohol getting in his eye last night.  Treatment plan: 1.  The findings and treatment plan were discussed in detail with the patient.  Patient was in agreement. 2.  I reached out to the ophthalmologist on-call Dr. Valetta Close and we discussed this in detail.  His recommendation was to do a fluorescein exam which was done and it did not reveal a corneal abrasion, foreign body, or an epithelial defect that I could visualize. 3.  We will put him on Polytrim 4 times a day as directed. 4.  Dr. Valetta Close wants to see him tomorrow morning at 845.  I advised the patient to arrive at 830. 5.  Educational handouts and instructions were provided. 6.  Patient was discharged in stable condition.  He will  follow-up here as needed. 7.  Counseled the patient that if his symptoms were to worsen in any way he should go directly to the ER so that the ophthalmologist on-call can come in and evaluate him today.  He voiced verbal understanding.    Final  Clinical Impressions(s) / UC Diagnoses   Final diagnoses:  None   Discharge Instructions   None    ED Prescriptions    Medication Sig Dispense Auth. Provider   trimethoprim-polymyxin b (POLYTRIM) ophthalmic solution Place 1 drop into the right eye every 4 (four) hours. 10 mL Verda Cumins, MD     PDMP not reviewed this encounter.   Verda Cumins, MD 09/26/20 1136

## 2020-09-26 NOTE — ED Notes (Signed)
Eye supplies at bedside

## 2020-09-26 NOTE — Discharge Instructions (Addendum)
Please take the antibiotic as prescribed.  Inside your pharmacy. Please follow-up with Dr. Valetta Close tomorrow morning.  Please arrive at his office at 830.   Please see educational instructional handouts. If your symptoms were to worsen in any way then please go to the ER and do not wait until tomorrow.

## 2020-09-27 DIAGNOSIS — H04121 Dry eye syndrome of right lacrimal gland: Secondary | ICD-10-CM | POA: Diagnosis not present

## 2020-10-02 ENCOUNTER — Other Ambulatory Visit: Payer: Self-pay | Admitting: Internal Medicine

## 2020-10-02 NOTE — Telephone Encounter (Signed)
Please refill as per office routine med refill policy (all routine meds refilled for 3 mo or monthly per pt preference up to one year from last visit, then month to month grace period for 3 mo, then further med refills will have to be denied)  

## 2020-10-18 ENCOUNTER — Other Ambulatory Visit: Payer: Self-pay

## 2020-10-18 ENCOUNTER — Encounter: Payer: Self-pay | Admitting: Internal Medicine

## 2020-10-18 ENCOUNTER — Ambulatory Visit (INDEPENDENT_AMBULATORY_CARE_PROVIDER_SITE_OTHER): Payer: Medicare Other | Admitting: Internal Medicine

## 2020-10-18 VITALS — BP 158/100 | HR 64 | Ht 67.0 in | Wt 197.8 lb

## 2020-10-18 DIAGNOSIS — F410 Panic disorder [episodic paroxysmal anxiety] without agoraphobia: Secondary | ICD-10-CM

## 2020-10-18 DIAGNOSIS — F411 Generalized anxiety disorder: Secondary | ICD-10-CM | POA: Diagnosis not present

## 2020-10-18 DIAGNOSIS — R0989 Other specified symptoms and signs involving the circulatory and respiratory systems: Secondary | ICD-10-CM

## 2020-10-18 DIAGNOSIS — E7800 Pure hypercholesterolemia, unspecified: Secondary | ICD-10-CM | POA: Diagnosis not present

## 2020-10-18 DIAGNOSIS — E559 Vitamin D deficiency, unspecified: Secondary | ICD-10-CM

## 2020-10-18 DIAGNOSIS — E538 Deficiency of other specified B group vitamins: Secondary | ICD-10-CM

## 2020-10-18 DIAGNOSIS — C61 Malignant neoplasm of prostate: Secondary | ICD-10-CM

## 2020-10-18 DIAGNOSIS — R739 Hyperglycemia, unspecified: Secondary | ICD-10-CM

## 2020-10-18 LAB — CBC WITH DIFFERENTIAL/PLATELET
Basophils Absolute: 0 10*3/uL (ref 0.0–0.1)
Basophils Relative: 0.3 % (ref 0.0–3.0)
Eosinophils Absolute: 0.1 10*3/uL (ref 0.0–0.7)
Eosinophils Relative: 1.2 % (ref 0.0–5.0)
HCT: 42.8 % (ref 39.0–52.0)
Hemoglobin: 14.6 g/dL (ref 13.0–17.0)
Lymphocytes Relative: 22 % (ref 12.0–46.0)
Lymphs Abs: 1.7 10*3/uL (ref 0.7–4.0)
MCHC: 34.1 g/dL (ref 30.0–36.0)
MCV: 91.7 fl (ref 78.0–100.0)
Monocytes Absolute: 0.6 10*3/uL (ref 0.1–1.0)
Monocytes Relative: 7.6 % (ref 3.0–12.0)
Neutro Abs: 5.3 10*3/uL (ref 1.4–7.7)
Neutrophils Relative %: 68.9 % (ref 43.0–77.0)
Platelets: 276 10*3/uL (ref 150.0–400.0)
RBC: 4.67 Mil/uL (ref 4.22–5.81)
RDW: 14.2 % (ref 11.5–15.5)
WBC: 7.8 10*3/uL (ref 4.0–10.5)

## 2020-10-18 LAB — BASIC METABOLIC PANEL
BUN: 11 mg/dL (ref 6–23)
CO2: 28 mEq/L (ref 19–32)
Calcium: 9.3 mg/dL (ref 8.4–10.5)
Chloride: 105 mEq/L (ref 96–112)
Creatinine, Ser: 0.97 mg/dL (ref 0.40–1.50)
GFR: 78.03 mL/min (ref >60.00)
Glucose, Bld: 97 mg/dL (ref 70–99)
Potassium: 3.5 mEq/L (ref 3.5–5.1)
Sodium: 141 mEq/L (ref 135–145)

## 2020-10-18 LAB — LIPID PANEL
Cholesterol: 174 mg/dL (ref 0–200)
HDL: 54 mg/dL (ref >39.00)
LDL Cholesterol: 104 mg/dL — ABNORMAL HIGH (ref 0–99)
NonHDL: 120.41
Total CHOL/HDL Ratio: 3
Triglycerides: 81 mg/dL (ref 0.0–149.0)
VLDL: 16.2 mg/dL (ref 0.0–40.0)

## 2020-10-18 LAB — URINALYSIS, ROUTINE W REFLEX MICROSCOPIC
Bilirubin Urine: NEGATIVE
Ketones, ur: NEGATIVE
Leukocytes,Ua: NEGATIVE
Nitrite: NEGATIVE
RBC / HPF: NONE SEEN (ref 0–?)
Specific Gravity, Urine: 1.015 (ref 1.000–1.030)
Total Protein, Urine: NEGATIVE
Urine Glucose: NEGATIVE
Urobilinogen, UA: 0.2 (ref 0.0–1.0)
WBC, UA: NONE SEEN (ref 0–?)
pH: 7.5 (ref 5.0–8.0)

## 2020-10-18 LAB — TSH: TSH: 1.25 u[IU]/mL (ref 0.35–4.50)

## 2020-10-18 LAB — VITAMIN D 25 HYDROXY (VIT D DEFICIENCY, FRACTURES): VITD: 21.09 ng/mL — ABNORMAL LOW (ref 30.00–100.00)

## 2020-10-18 LAB — HEPATIC FUNCTION PANEL
ALT: 18 U/L (ref 0–53)
AST: 16 U/L (ref 0–37)
Albumin: 4.5 g/dL (ref 3.5–5.2)
Alkaline Phosphatase: 72 U/L (ref 39–117)
Bilirubin, Direct: 0.2 mg/dL (ref 0.0–0.3)
Total Bilirubin: 1 mg/dL (ref 0.2–1.2)
Total Protein: 7.7 g/dL (ref 6.0–8.3)

## 2020-10-18 LAB — PSA: PSA: 0.28 ng/mL (ref 0.10–4.00)

## 2020-10-18 LAB — VITAMIN B12: Vitamin B-12: 461 pg/mL (ref 211–911)

## 2020-10-18 LAB — HEMOGLOBIN A1C: Hgb A1c MFr Bld: 6.1 % (ref 4.6–6.5)

## 2020-10-18 MED ORDER — BENAZEPRIL HCL 10 MG PO TABS: 10.0000 mg | ORAL_TABLET | Freq: Every day | ORAL | 3 refills | Status: DC

## 2020-10-18 MED ORDER — TAMSULOSIN HCL 0.4 MG PO CAPS
0.4000 mg | ORAL_CAPSULE | Freq: Every day | ORAL | 3 refills | Status: DC
Start: 2020-10-18 — End: 2021-11-09

## 2020-10-18 MED ORDER — AMLODIPINE BESYLATE 5 MG PO TABS: 1.0000 | ORAL_TABLET | Freq: Every day | ORAL | 3 refills | Status: DC

## 2020-10-18 MED ORDER — ATORVASTATIN CALCIUM 20 MG PO TABS
1.0000 | ORAL_TABLET | Freq: Every day | ORAL | 3 refills | Status: DC
Start: 2020-10-18 — End: 2021-07-20

## 2020-10-18 NOTE — Assessment & Plan Note (Signed)
Lab Results  Component Value Date   HGBA1C 6.3 01/14/2019   Stable, pt to continue current medical treatment  - diet, but for f/u a1c today

## 2020-10-18 NOTE — Assessment & Plan Note (Signed)
High today with white coat htn, but check freq at home and orthostatic with multiple meds; already stopped the hct per pt; now ok for benazepril 10 mg (down from 20), and cont amlodipine, and continue to monitor symptoms and BP at home  BP Readings from Last 3 Encounters:  10/18/20 (!) 158/100  09/26/20 (!) 174/95  09/25/19 (!) 150/80

## 2020-10-18 NOTE — Assessment & Plan Note (Signed)
Also for PSA with labs,  to f/u any worsening symptoms or concerns, currently asmpt

## 2020-10-18 NOTE — Addendum Note (Signed)
Addended by: Jacobo Forest on: 10/18/2020 10:17 AM   Modules accepted: Orders

## 2020-10-18 NOTE — Assessment & Plan Note (Signed)
Stable recently, cont klonopin prn for now, declines need for referral for  Counseling or psychiatry

## 2020-10-18 NOTE — Patient Instructions (Signed)
Ok to decrease the benazepril to 10 mg per day  Please continue all other medications as before, and refills have been done if requested.  Please have the pharmacy call with any other refills you may need.  Please continue your efforts at being more active, low cholesterol diet, and weight control.  You are otherwise up to date with prevention measures today.  Please keep your appointments with your specialists as you may have planned  Please go to the LAB at the blood drawing area for the tests to be done  You will be contacted by phone if any changes need to be made immediately.  Otherwise, you will receive a letter about your results with an explanation, but please check with MyChart first.  Please remember to sign up for MyChart if you have not done so, as this will be important to you in the future with finding out test results, communicating by private email, and scheduling acute appointments online when needed.  Please make an Appointment to return in 6 months, or sooner if needed

## 2020-10-18 NOTE — Assessment & Plan Note (Signed)
Lab Results  Component Value Date   [place] 09/25/2018   Stable, pt to continue current statin lipitor 20, and f/u lab today

## 2020-10-18 NOTE — Progress Notes (Signed)
Patient ID: [name] [name], male   DOB: 1950-08-02, 70 y.o.   MRN: [id number]         Chief Complaint:: yearly exam       HPI:  [name] [name] is a 70 y.o. male here with above, declines shingrix and covid booster;.   Checks BP at home and remains labile, always high when comes here, but often low and feels orthostatic, so had to stop the HCT and half the benazepril to avoid daily dizzy spells.  Pt denies chest pain, increased sob or doe, wheezing, orthopnea, PND, increased LE swelling, palpitations, or syncope.   Pt denies polydipsia, polyuria, or new focal neuro s/s   Pt denies fever, wt loss, night sweats, loss of appetite, or other constitutional symptoms  Denies urinary symptoms such as dysuria, frequency, urgency, flank pain, hematuria or n/v, fever, chills.  Denies worsening reflux, abd pain, dysphagia, n/v, bowel change or blood.  No other new complaints Denies worsening depressive symptoms, suicidal ideation, or panic;  Wt Readings from Last 3 Encounters:  10/18/20 197 lb 12.8 oz (89.7 kg)  09/25/19 207 lb (93.9 kg)  05/15/19 206 lb (93.4 kg)   BP Readings from Last 3 Encounters:  10/18/20 (!) 158/100  09/26/20 (!) 174/95  09/25/19 (!) 150/80   Immunization History  Administered Date(s) Administered   Fluad Quad(high Dose 65+) 01/14/2019   Influenza Split 03/15/2012   Influenza Whole 03/08/2006, 03/09/2008, 02/05/2009   Influenza, High Dose Seasonal PF 02/07/2018   Influenza,inj,Quad PF,6+ Mos 02/03/2013, 02/27/2014, 02/23/2015, 02/24/2016, 02/06/2017   Influenza-Unspecified 02/07/2018   PFIZER(Purple Top)SARS-COV-2 Vaccination 05/20/2019, 06/09/2019   Pneumococcal Conjugate-13 07/15/2013, 04/08/2014   Pneumococcal Polysaccharide-23 09/23/2015   Td 04/20/2008   Tdap 06/12/2018   Zoster, Live 05/06/2010  There are no preventive care reminders to display for this patient.    Past Medical History:  Diagnosis Date   BENIGN PROSTATIC HYPERTROPHY 05/06/2010   COMMON MIGRAINE  07/31/2007   GERD 07/31/2007   HYPERLIPIDEMIA 01/04/2007   HYPERTENSION 01/04/2007   OVERACTIVE BLADDER 03/06/2007   Prostate cancer ([place]) 02/04/15   Past Surgical History:  Procedure Laterality Date   COLONOSCOPY  2009    reports that he has never smoked. He has never used smokeless tobacco. He reports that he does not drink alcohol and does not use drugs. family history includes Heart disease (age of onset: 11) in his brother; Lung cancer (age of onset: 71) in his father. Allergies  Allergen Reactions   Celebrex [Celecoxib] Other (See Comments)    Stroke like symptoms   Crestor [Rosuvastatin]     weakness   Detrol [Tolterodine] Other (See Comments)    headache   Ditropan [Oxybutynin] Other (See Comments)    Dry mouth   Norvasc [Amlodipine Besylate] Swelling   Tadalafil Other (See Comments)    headache   Toprol Xl [Metoprolol] Other (See Comments)    dizzy   Current Outpatient Medications on File Prior to Visit  Medication Sig Dispense Refill   aspirin 81 MG tablet Take 81 mg by mouth daily.      latanoprost (XALATAN) 0.005 % ophthalmic solution Place 1 drop into the right eye daily.  1   polyethylene glycol (MIRALAX / GLYCOLAX) packet Take 17 g by mouth as needed.      sildenafil (REVATIO) 20 MG tablet TAKE THREE TABLETS BY MOUTH DAILY AS NEEDED 90 tablet 11   SYSTANE ULTRA 0.4-0.3 % SOLN SMARTSIG:1 Drop(s) In Eye(s) PRN     clonazePAM (KLONOPIN) 0.5 MG  tablet Take 1 tablet (0.5 mg total) by mouth 2 (two) times daily as needed for anxiety. 60 tablet 1   dorzolamide-timolol (COSOPT) 22.3-6.8 MG/ML ophthalmic solution 1 drop 2 (two) times daily.     No current facility-administered medications on file prior to visit.        ROS:  All others reviewed and negative.  Objective        PE:  BP (!) 158/100 (BP Location: Left Arm, Patient Position: Sitting, Cuff Size: Large)   Pulse 64   Ht 5\' 7"  (1.702 m)   Wt 197 lb 12.8 oz (89.7 kg)   SpO2 97%   BMI 30.98 kg/m                  Constitutional: Pt appears in NAD               HENT: Head: NCAT.                Right Ear: External ear normal.                 Left Ear: External ear normal.                Eyes: . Pupils are equal, round, and reactive to light. Conjunctivae and EOM are normal               Nose: without d/c or deformity               Neck: Neck supple. Gross normal ROM               Cardiovascular: Normal rate and regular rhythm.                 Pulmonary/Chest: Effort normal and breath sounds without rales or wheezing.                Abd:  Soft, NT, ND, + BS, no organomegaly               Neurological: Pt is alert. At baseline orientation, motor grossly intact               Skin: Skin is warm. No rashes, no other new lesions, LE edema - none               Psychiatric: Pt behavior is normal without agitation   Micro: none  Cardiac tracings I have personally interpreted today:  none  Pertinent Radiological findings (summarize): none   Lab Results  Component Value Date   WBC 8.1 01/14/2019   HGB 15.4 01/14/2019   HCT 45.9 01/14/2019   PLT 248.0 01/14/2019   GLUCOSE 116 (H) 01/14/2019   CHOL 173 09/25/2018   TRIG 88.0 09/25/2018   HDL 60.00 09/25/2018   LDLDIRECT 139.2 10/29/2012   LDLCALC 95 09/25/2018   ALT 25 09/25/2018   AST 21 09/25/2018   NA 142 01/14/2019   K 4.0 01/14/2019   CL 104 01/14/2019   CREATININE 1.08 01/14/2019   BUN 11 01/14/2019   CO2 29 01/14/2019   TSH 0.99 01/14/2019   PSA 0.23 03/28/2019   INR 1.0 06/24/2007   HGBA1C 6.3 01/14/2019   Assessment/Plan:  [name] [name] is a 70 y.o. Black or African American [2] male with  has a past medical history of BENIGN PROSTATIC HYPERTROPHY (05/06/2010), COMMON MIGRAINE (07/31/2007), GERD (07/31/2007), HYPERLIPIDEMIA (01/04/2007), HYPERTENSION (01/04/2007), OVERACTIVE BLADDER (03/06/2007), and Prostate cancer ([place]) (02/04/15).  Malignant neoplasm of prostate ([place]) Also for PSA  with labs,  to f/u any worsening symptoms or  concerns, currently asmpt  Hypercholesterolemia Lab Results  Component Value Date   LDLCALC 95 09/25/2018   Stable, pt to continue current statin lipitor 20, and f/u lab today   Labile hypertension High today with white coat htn, but check freq at home and orthostatic with multiple meds; already stopped the hct per pt; now ok for benazepril 10 mg (down from 20), and cont amlodipine, and continue to monitor symptoms and BP at home  BP Readings from Last 3 Encounters:  10/18/20 (!) 158/100  09/26/20 (!) 174/95  09/25/19 (!) 150/80    Hyperglycemia Lab Results  Component Value Date   HGBA1C 6.3 01/14/2019   Stable, pt to continue current medical treatment  - diet, but for f/u a1c today   Generalized anxiety disorder with panic attacks Stable recently, cont klonopin prn for now, declines need for referral for  Counseling or psychiatry  Vitamin D deficiency Last vitamin D Lab Results  Component Value Date   VD25OH 18.95 (L) 09/25/2018   Low, to restart oral replacement and f/u lab  Followup: Return in about 6 months (around 04/19/2021).  [name], MD 10/18/2020 10:03 AM [facility]

## 2020-10-18 NOTE — Assessment & Plan Note (Signed)
Last vitamin D Lab Results  Component Value Date   VD25OH 18.95 (L) 09/25/2018   Low, to restart oral replacement and f/u lab

## 2020-10-19 ENCOUNTER — Encounter: Payer: Self-pay | Admitting: Internal Medicine

## 2020-10-25 DIAGNOSIS — C61 Malignant neoplasm of prostate: Secondary | ICD-10-CM | POA: Diagnosis not present

## 2020-11-11 ENCOUNTER — Encounter: Payer: Self-pay | Admitting: Internal Medicine

## 2020-12-15 DIAGNOSIS — H401131 Primary open-angle glaucoma, bilateral, mild stage: Secondary | ICD-10-CM | POA: Diagnosis not present

## 2020-12-15 DIAGNOSIS — H35373 Puckering of macula, bilateral: Secondary | ICD-10-CM | POA: Diagnosis not present

## 2020-12-15 DIAGNOSIS — Z961 Presence of intraocular lens: Secondary | ICD-10-CM | POA: Diagnosis not present

## 2020-12-15 DIAGNOSIS — H16223 Keratoconjunctivitis sicca, not specified as Sjogren's, bilateral: Secondary | ICD-10-CM | POA: Diagnosis not present

## 2020-12-15 DIAGNOSIS — H0102B Squamous blepharitis left eye, upper and lower eyelids: Secondary | ICD-10-CM | POA: Diagnosis not present

## 2020-12-15 DIAGNOSIS — H02833 Dermatochalasis of right eye, unspecified eyelid: Secondary | ICD-10-CM | POA: Diagnosis not present

## 2020-12-15 DIAGNOSIS — H02836 Dermatochalasis of left eye, unspecified eyelid: Secondary | ICD-10-CM | POA: Diagnosis not present

## 2020-12-15 DIAGNOSIS — I10 Essential (primary) hypertension: Secondary | ICD-10-CM | POA: Diagnosis not present

## 2020-12-15 DIAGNOSIS — H26491 Other secondary cataract, right eye: Secondary | ICD-10-CM | POA: Diagnosis not present

## 2020-12-15 DIAGNOSIS — H35352 Cystoid macular degeneration, left eye: Secondary | ICD-10-CM | POA: Diagnosis not present

## 2020-12-15 DIAGNOSIS — H0102A Squamous blepharitis right eye, upper and lower eyelids: Secondary | ICD-10-CM | POA: Diagnosis not present

## 2020-12-23 ENCOUNTER — Ambulatory Visit: Payer: Medicare Other

## 2021-01-12 DIAGNOSIS — H35352 Cystoid macular degeneration, left eye: Secondary | ICD-10-CM | POA: Diagnosis not present

## 2021-01-12 DIAGNOSIS — H401131 Primary open-angle glaucoma, bilateral, mild stage: Secondary | ICD-10-CM | POA: Diagnosis not present

## 2021-01-12 DIAGNOSIS — H35373 Puckering of macula, bilateral: Secondary | ICD-10-CM | POA: Diagnosis not present

## 2021-01-12 DIAGNOSIS — H16223 Keratoconjunctivitis sicca, not specified as Sjogren's, bilateral: Secondary | ICD-10-CM | POA: Diagnosis not present

## 2021-01-12 DIAGNOSIS — H0102A Squamous blepharitis right eye, upper and lower eyelids: Secondary | ICD-10-CM | POA: Diagnosis not present

## 2021-01-12 DIAGNOSIS — Z961 Presence of intraocular lens: Secondary | ICD-10-CM | POA: Diagnosis not present

## 2021-01-12 DIAGNOSIS — H26491 Other secondary cataract, right eye: Secondary | ICD-10-CM | POA: Diagnosis not present

## 2021-01-12 DIAGNOSIS — H0102B Squamous blepharitis left eye, upper and lower eyelids: Secondary | ICD-10-CM | POA: Diagnosis not present

## 2021-02-17 DIAGNOSIS — Z23 Encounter for immunization: Secondary | ICD-10-CM | POA: Diagnosis not present

## 2021-02-24 DIAGNOSIS — Z23 Encounter for immunization: Secondary | ICD-10-CM | POA: Diagnosis not present

## 2021-03-06 ENCOUNTER — Encounter: Payer: Self-pay | Admitting: Internal Medicine

## 2021-03-08 MED ORDER — LOSARTAN POTASSIUM 50 MG PO TABS: 50.0000 mg | ORAL_TABLET | Freq: Every day | ORAL | 3 refills | Status: DC

## 2021-03-16 MED ORDER — LOSARTAN POTASSIUM-HCTZ 100-12.5 MG PO TABS: 1.0000 | ORAL_TABLET | Freq: Every day | ORAL | 3 refills | Status: DC

## 2021-03-16 NOTE — Addendum Note (Signed)
Addended by: Biagio Borg on: 03/16/2021 07:58 PM   Modules accepted: Orders

## 2021-03-21 ENCOUNTER — Encounter: Payer: Self-pay | Admitting: Internal Medicine

## 2021-03-21 MED ORDER — LOSARTAN POTASSIUM-HCTZ 100-12.5 MG PO TABS: 1.0000 | ORAL_TABLET | Freq: Every day | ORAL | 3 refills | Status: DC

## 2021-03-21 MED ORDER — NEBIVOLOL HCL 5 MG PO TABS: 5.0000 mg | ORAL_TABLET | Freq: Every day | ORAL | 3 refills | Status: DC

## 2021-04-11 DIAGNOSIS — I10 Essential (primary) hypertension: Secondary | ICD-10-CM | POA: Diagnosis not present

## 2021-04-11 DIAGNOSIS — H0102B Squamous blepharitis left eye, upper and lower eyelids: Secondary | ICD-10-CM | POA: Diagnosis not present

## 2021-04-11 DIAGNOSIS — H35373 Puckering of macula, bilateral: Secondary | ICD-10-CM | POA: Diagnosis not present

## 2021-04-11 DIAGNOSIS — Z961 Presence of intraocular lens: Secondary | ICD-10-CM | POA: Diagnosis not present

## 2021-04-11 DIAGNOSIS — H0102A Squamous blepharitis right eye, upper and lower eyelids: Secondary | ICD-10-CM | POA: Diagnosis not present

## 2021-04-11 DIAGNOSIS — H02831 Dermatochalasis of right upper eyelid: Secondary | ICD-10-CM | POA: Diagnosis not present

## 2021-04-11 DIAGNOSIS — H401131 Primary open-angle glaucoma, bilateral, mild stage: Secondary | ICD-10-CM | POA: Diagnosis not present

## 2021-04-11 DIAGNOSIS — H26491 Other secondary cataract, right eye: Secondary | ICD-10-CM | POA: Diagnosis not present

## 2021-04-11 DIAGNOSIS — H35352 Cystoid macular degeneration, left eye: Secondary | ICD-10-CM | POA: Diagnosis not present

## 2021-04-11 DIAGNOSIS — H02834 Dermatochalasis of left upper eyelid: Secondary | ICD-10-CM | POA: Diagnosis not present

## 2021-04-11 DIAGNOSIS — H16223 Keratoconjunctivitis sicca, not specified as Sjogren's, bilateral: Secondary | ICD-10-CM | POA: Diagnosis not present

## 2021-04-18 DIAGNOSIS — C61 Malignant neoplasm of prostate: Secondary | ICD-10-CM | POA: Diagnosis not present

## 2021-04-19 ENCOUNTER — Ambulatory Visit (INDEPENDENT_AMBULATORY_CARE_PROVIDER_SITE_OTHER): Payer: Medicare Other | Admitting: Internal Medicine

## 2021-04-19 ENCOUNTER — Other Ambulatory Visit: Payer: Self-pay

## 2021-04-19 VITALS — BP 142/98 | HR 57 | Temp 97.9°F | Wt 203.0 lb

## 2021-04-19 DIAGNOSIS — R0989 Other specified symptoms and signs involving the circulatory and respiratory systems: Secondary | ICD-10-CM | POA: Diagnosis not present

## 2021-04-19 DIAGNOSIS — E559 Vitamin D deficiency, unspecified: Secondary | ICD-10-CM

## 2021-04-19 DIAGNOSIS — M7918 Myalgia, other site: Secondary | ICD-10-CM | POA: Diagnosis not present

## 2021-04-19 DIAGNOSIS — R739 Hyperglycemia, unspecified: Secondary | ICD-10-CM

## 2021-04-19 MED ORDER — TIZANIDINE HCL 2 MG PO TABS: 2.0000 mg | ORAL_TABLET | Freq: Three times a day (TID) | ORAL | 1 refills | Status: DC | PRN

## 2021-04-19 MED ORDER — PREDNISONE 10 MG PO TABS: ORAL_TABLET | ORAL | 0 refills | Status: DC

## 2021-04-19 MED ORDER — TRAMADOL HCL 50 MG PO TABS: 50.0000 mg | ORAL_TABLET | Freq: Four times a day (QID) | ORAL | 0 refills | Status: DC | PRN

## 2021-04-19 NOTE — Progress Notes (Signed)
Patient ID: [name] [name], male   DOB: 06/13/1950, 70 y.o.   MRN: [id number]        Chief Complaint: follow up left upper back/neck pain, htn,        HPI:  [name] [name] is a 70 y.o. male here with c/o 2 wks onset left post latera neck and upper back sharp sometimes dull, mild to mod pain, intermittent, without other LUE radicular symptoms such as pain, numbness or weakness .  Worse to turn head to left or raise the left arm overhead, but otherwise no other makes better or worse.  Has known C spine DJD.  BP at home has been less than 140/90.  Pt denies chest pain, increased sob or doe, wheezing, orthopnea, PND, increased LE swelling, palpitations, dizziness or syncope.   Pt denies polydipsia, polyuria, or new focal neuro s/s.   Pt denies fever, wt loss, night sweats, loss of appetite, or other constitutional symptoms  No other new complaints         Wt Readings from Last 3 Encounters:  04/19/21 203 lb (92.1 kg)  10/18/20 197 lb 12.8 oz (89.7 kg)  09/25/19 207 lb (93.9 kg)   BP Readings from Last 3 Encounters:  04/19/21 (!) 142/98  10/18/20 (!) 158/100  09/26/20 (!) 174/95         Past Medical History:  Diagnosis Date   BENIGN PROSTATIC HYPERTROPHY 05/06/2010   COMMON MIGRAINE 07/31/2007   GERD 07/31/2007   HYPERLIPIDEMIA 01/04/2007   HYPERTENSION 01/04/2007   OVERACTIVE BLADDER 03/06/2007   Prostate cancer ([place]) 02/04/15   Past Surgical History:  Procedure Laterality Date   COLONOSCOPY  2009    reports that he has never smoked. He has never used smokeless tobacco. He reports that he does not drink alcohol and does not use drugs. family history includes Heart disease (age of onset: 83) in his brother; Lung cancer (age of onset: 21) in his father. Allergies  Allergen Reactions   Celebrex [Celecoxib] Other (See Comments)    Stroke like symptoms   Crestor [Rosuvastatin]     weakness   Detrol [Tolterodine] Other (See Comments)    headache   Ditropan [Oxybutynin] Other (See Comments)     Dry mouth   Norvasc [Amlodipine Besylate] Swelling   Tadalafil Other (See Comments)    headache   Toprol Xl [Metoprolol] Other (See Comments)    dizzy   Current Outpatient Medications on File Prior to Visit  Medication Sig Dispense Refill   aspirin 81 MG tablet Take 81 mg by mouth daily.      atorvastatin (LIPITOR) 20 MG tablet Take 1 tablet (20 mg total) by mouth daily. 90 tablet 3   clonazePAM (KLONOPIN) 0.5 MG tablet Take 1 tablet (0.5 mg total) by mouth 2 (two) times daily as needed for anxiety. 60 tablet 1   dorzolamide-timolol (COSOPT) 22.3-6.8 MG/ML ophthalmic solution 1 drop 2 (two) times daily.     latanoprost (XALATAN) 0.005 % ophthalmic solution Place 1 drop into the right eye daily.  1   losartan-hydrochlorothiazide (HYZAAR) 100-12.5 MG tablet Take 1 tablet by mouth daily. 90 tablet 3   nebivolol (BYSTOLIC) 5 MG tablet Take 1 tablet (5 mg total) by mouth daily. 90 tablet 3   polyethylene glycol (MIRALAX / GLYCOLAX) packet Take 17 g by mouth as needed.      sildenafil (REVATIO) 20 MG tablet TAKE THREE TABLETS BY MOUTH DAILY AS NEEDED 90 tablet 11   SYSTANE ULTRA 0.4-0.3 % SOLN SMARTSIG:1  Drop(s) In Eye(s) PRN     tamsulosin (FLOMAX) 0.4 MG CAPS capsule Take 1 capsule (0.4 mg total) by mouth daily. 90 capsule 3   No current facility-administered medications on file prior to visit.        ROS:  All others reviewed and negative.  Objective        PE:  BP (!) 142/98 (BP Location: Left Arm, Patient Position: Sitting, Cuff Size: Normal)    Pulse (!) 57    Temp 97.9 F (36.6 C) (Oral)    Wt 203 lb (92.1 kg)    SpO2 98%    BMI 31.79 kg/m                 Constitutional: Pt appears in NAD               HENT: Head: NCAT.                Right Ear: External ear normal.                 Left Ear: External ear normal.                Eyes: . Pupils are equal, round, and reactive to light. Conjunctivae and EOM are normal               Nose: without d/c or deformity                Neck: Neck supple. Gross normal ROM               Cardiovascular: Normal rate and regular rhythm.                 Pulmonary/Chest: Effort normal and breath sounds without rales or wheezing. ; tender left upper trapezoid without rash or swelling               Abd:  Soft, NT, ND, + BS, no organomegaly               Neurological: Pt is alert. At baseline orientation, motor grossly intact               Skin: Skin is warm. No rashes, no other new lesions, LE edema - none               Psychiatric: Pt behavior is normal without agitation   Micro: none  Cardiac tracings I have personally interpreted today:  none  Pertinent Radiological findings (summarize): none   Lab Results  Component Value Date   WBC 7.8 10/18/2020   HGB 14.6 10/18/2020   HCT 42.8 10/18/2020   PLT 276.0 10/18/2020   GLUCOSE 97 10/18/2020   CHOL 174 10/18/2020   TRIG 81.0 10/18/2020   HDL 54.00 10/18/2020   LDLDIRECT 139.2 10/29/2012   LDLCALC 104 (H) 10/18/2020   ALT 18 10/18/2020   AST 16 10/18/2020   NA 141 10/18/2020   K 3.5 10/18/2020   CL 105 10/18/2020   CREATININE 0.97 10/18/2020   BUN 11 10/18/2020   CO2 28 10/18/2020   TSH 1.25 10/18/2020   PSA 0.28 10/18/2020   INR 1.0 06/24/2007   HGBA1C 6.1 10/18/2020   Assessment/Plan:  [name] [name] is a 70 y.o. Black or African American [2] male with  has a past medical history of BENIGN PROSTATIC HYPERTROPHY (05/06/2010), COMMON MIGRAINE (07/31/2007), GERD (07/31/2007), HYPERLIPIDEMIA (01/04/2007), HYPERTENSION (01/04/2007), OVERACTIVE BLADDER (03/06/2007), and Prostate cancer ([place]) (02/04/15).  Vitamin D deficiency Last vitamin  D Lab Results  Component Value Date   VD25OH 21.09 (L) 10/18/2020   Low, reminded to start oral replacement   Labile hypertension BP Readings from Last 3 Encounters:  04/19/21 (!) 142/98  10/18/20 (!) 158/100  09/26/20 (!) 174/95   Uncontrolled here but pt to continue medical treatment bystlic, hyzaar as declines change  today   Hyperglycemia Lab Results  Component Value Date   HGBA1C 6.1 10/18/2020   Stable, pt to continue current medical treatment  - diet   Myofascial pain syndrome, cervical Exam c/w msk strain - for pain control, predpac, muscle relaxer prn,  to f/u any worsening symptoms or concerns  Followup: Return if symptoms worsen or fail to improve.  [name], MD 04/24/2021 6:42 AM [facility]

## 2021-04-19 NOTE — Patient Instructions (Signed)
Please take all new medication as prescribed - the pain medication, prednisone, and muscle relaxer as needed  Please continue all other medications as before, and refills have been done if requested.  Please have the pharmacy call with any other refills you may need.  Please continue your efforts at being more active, low cholesterol diet, and weight control.  Please keep your appointments with your specialists as you may have planned

## 2021-04-24 ENCOUNTER — Encounter: Payer: Self-pay | Admitting: Internal Medicine

## 2021-04-24 NOTE — Assessment & Plan Note (Signed)
Lab Results  Component Value Date   HGBA1C 6.1 10/18/2020   Stable, pt to continue current medical treatment  - diet

## 2021-04-24 NOTE — Assessment & Plan Note (Signed)
Exam c/w msk strain - for pain control, predpac, muscle relaxer prn,  to f/u any worsening symptoms or concerns

## 2021-04-24 NOTE — Assessment & Plan Note (Signed)
BP Readings from Last 3 Encounters:  04/19/21 (!) 142/98  10/18/20 (!) 158/100  09/26/20 (!) 174/95   Uncontrolled here but pt to continue medical treatment bystlic, hyzaar as declines change today

## 2021-04-24 NOTE — Assessment & Plan Note (Signed)
Last vitamin D Lab Results  Component Value Date   VD25OH 21.09 (L) 10/18/2020   Low, reminded to start oral replacement

## 2021-04-25 ENCOUNTER — Other Ambulatory Visit: Payer: Self-pay | Admitting: Internal Medicine

## 2021-04-25 DIAGNOSIS — R351 Nocturia: Secondary | ICD-10-CM | POA: Diagnosis not present

## 2021-04-25 DIAGNOSIS — N401 Enlarged prostate with lower urinary tract symptoms: Secondary | ICD-10-CM | POA: Diagnosis not present

## 2021-04-25 DIAGNOSIS — C61 Malignant neoplasm of prostate: Secondary | ICD-10-CM | POA: Diagnosis not present

## 2021-06-16 ENCOUNTER — Ambulatory Visit: Payer: Medicare Other | Admitting: Internal Medicine

## 2021-07-15 DIAGNOSIS — C61 Malignant neoplasm of prostate: Secondary | ICD-10-CM | POA: Diagnosis not present

## 2021-07-15 DIAGNOSIS — N401 Enlarged prostate with lower urinary tract symptoms: Secondary | ICD-10-CM | POA: Diagnosis not present

## 2021-07-15 DIAGNOSIS — R3915 Urgency of urination: Secondary | ICD-10-CM | POA: Diagnosis not present

## 2021-07-20 ENCOUNTER — Other Ambulatory Visit: Payer: Self-pay | Admitting: Internal Medicine

## 2021-07-20 ENCOUNTER — Other Ambulatory Visit: Payer: Self-pay

## 2021-07-20 ENCOUNTER — Ambulatory Visit (INDEPENDENT_AMBULATORY_CARE_PROVIDER_SITE_OTHER): Payer: Medicare Other | Admitting: Internal Medicine

## 2021-07-20 ENCOUNTER — Encounter: Payer: Self-pay | Admitting: Internal Medicine

## 2021-07-20 VITALS — BP 170/80 | HR 45 | Temp 97.8°F | Ht 67.0 in | Wt 208.0 lb

## 2021-07-20 DIAGNOSIS — E7800 Pure hypercholesterolemia, unspecified: Secondary | ICD-10-CM | POA: Diagnosis not present

## 2021-07-20 DIAGNOSIS — R001 Bradycardia, unspecified: Secondary | ICD-10-CM | POA: Diagnosis not present

## 2021-07-20 DIAGNOSIS — E538 Deficiency of other specified B group vitamins: Secondary | ICD-10-CM | POA: Diagnosis not present

## 2021-07-20 DIAGNOSIS — E559 Vitamin D deficiency, unspecified: Secondary | ICD-10-CM | POA: Diagnosis not present

## 2021-07-20 DIAGNOSIS — N320 Bladder-neck obstruction: Secondary | ICD-10-CM | POA: Diagnosis not present

## 2021-07-20 DIAGNOSIS — R0989 Other specified symptoms and signs involving the circulatory and respiratory systems: Secondary | ICD-10-CM

## 2021-07-20 DIAGNOSIS — R739 Hyperglycemia, unspecified: Secondary | ICD-10-CM

## 2021-07-20 LAB — CBC WITH DIFFERENTIAL/PLATELET
Basophils Absolute: 0 10*3/uL (ref 0.0–0.1)
Basophils Relative: 0.3 % (ref 0.0–3.0)
Eosinophils Absolute: 0 10*3/uL (ref 0.0–0.7)
Eosinophils Relative: 0.5 % (ref 0.0–5.0)
HCT: 45.6 % (ref 39.0–52.0)
Hemoglobin: 15.4 g/dL (ref 13.0–17.0)
Lymphocytes Relative: 23.6 % (ref 12.0–46.0)
Lymphs Abs: 1.7 10*3/uL (ref 0.7–4.0)
MCHC: 33.8 g/dL (ref 30.0–36.0)
MCV: 94.2 fl (ref 78.0–100.0)
Monocytes Absolute: 0.5 10*3/uL (ref 0.1–1.0)
Monocytes Relative: 7.7 % (ref 3.0–12.0)
Neutro Abs: 4.8 10*3/uL (ref 1.4–7.7)
Neutrophils Relative %: 67.9 % (ref 43.0–77.0)
Platelets: 239 10*3/uL (ref 150.0–400.0)
RBC: 4.84 Mil/uL (ref 4.22–5.81)
RDW: 14.2 % (ref 11.5–15.5)
WBC: 7.1 10*3/uL (ref 4.0–10.5)

## 2021-07-20 LAB — HEMOGLOBIN A1C: Hgb A1c MFr Bld: 6.3 % (ref 4.6–6.5)

## 2021-07-20 LAB — LIPID PANEL
Cholesterol: 181 mg/dL (ref 0–200)
HDL: 62.9 mg/dL (ref >39.00)
LDL Cholesterol: 101 mg/dL — ABNORMAL HIGH (ref 0–99)
NonHDL: 118.16
Total CHOL/HDL Ratio: 3
Triglycerides: 85 mg/dL (ref 0.0–149.0)
VLDL: 17 mg/dL (ref 0.0–40.0)

## 2021-07-20 LAB — BASIC METABOLIC PANEL
BUN: 15 mg/dL (ref 6–23)
CO2: 31 mEq/L (ref 19–32)
Calcium: 9.8 mg/dL (ref 8.4–10.5)
Chloride: 99 mEq/L (ref 96–112)
Creatinine, Ser: 1.19 mg/dL (ref 0.40–1.50)
GFR: 60.74 mL/min (ref >60.00)
Glucose, Bld: 115 mg/dL — ABNORMAL HIGH (ref 70–99)
Potassium: 3.4 mEq/L — ABNORMAL LOW (ref 3.5–5.1)
Sodium: 139 mEq/L (ref 135–145)

## 2021-07-20 LAB — URINALYSIS, ROUTINE W REFLEX MICROSCOPIC
Bilirubin Urine: NEGATIVE
Ketones, ur: NEGATIVE
Leukocytes,Ua: NEGATIVE
Nitrite: NEGATIVE
Specific Gravity, Urine: 1.01 (ref 1.000–1.030)
Total Protein, Urine: NEGATIVE
Urine Glucose: NEGATIVE
Urobilinogen, UA: 0.2 (ref 0.0–1.0)
pH: 7.5 (ref 5.0–8.0)

## 2021-07-20 LAB — VITAMIN B12: Vitamin B-12: 468 pg/mL (ref 211–911)

## 2021-07-20 LAB — HEPATIC FUNCTION PANEL
ALT: 20 U/L (ref 0–53)
AST: 22 U/L (ref 0–37)
Albumin: 4.6 g/dL (ref 3.5–5.2)
Alkaline Phosphatase: 57 U/L (ref 39–117)
Bilirubin, Direct: 0.2 mg/dL (ref 0.0–0.3)
Total Bilirubin: 0.9 mg/dL (ref 0.2–1.2)
Total Protein: 7.6 g/dL (ref 6.0–8.3)

## 2021-07-20 LAB — PSA: PSA: 0.22 ng/mL (ref 0.10–4.00)

## 2021-07-20 LAB — TSH: TSH: 1.45 u[IU]/mL (ref 0.35–5.50)

## 2021-07-20 LAB — VITAMIN D 25 HYDROXY (VIT D DEFICIENCY, FRACTURES): VITD: 32.08 ng/mL (ref 30.00–100.00)

## 2021-07-20 MED ORDER — ATORVASTATIN CALCIUM 40 MG PO TABS: 40.0000 mg | ORAL_TABLET | Freq: Every day | ORAL | 3 refills | Status: DC

## 2021-07-20 MED ORDER — CHOLECALCIFEROL 50 MCG (2000 UT) PO TABS: ORAL_TABLET | ORAL | 99 refills | Status: AC

## 2021-07-20 MED ORDER — POTASSIUM CHLORIDE ER 10 MEQ PO TBCR: 10.0000 meq | EXTENDED_RELEASE_TABLET | Freq: Every day | ORAL | 3 refills | Status: DC

## 2021-07-20 MED ORDER — OLMESARTAN MEDOXOMIL-HCTZ 40-25 MG PO TABS: 1.0000 | ORAL_TABLET | Freq: Every day | ORAL | 3 refills | Status: DC

## 2021-07-20 NOTE — Progress Notes (Signed)
Patient ID: [name] [name], male   DOB: 1950-11-27, 71 y.o.   MRN: [id number] ? ? ? ?     Chief Complaint:: yearly exam and Office Visit (BP check) ?  ? ?     HPI:  [name] [name] is a 71 y.o. male here overall doing ok.  Pt denies chest pain, increased sob or doe, wheezing, orthopnea, PND, increased LE swelling, palpitations, dizziness or syncope.   Pt denies polydipsia, polyuria, or new focal neuro s/s.    Pt denies fever, wt loss, night sweats, loss of appetite, or other constitutional symptoms  BP at home in the past 2 wks has been daily that AM BP are mild elevated, and the PM BPs are lower in the normative range.  Good med compliance, inclulding lipitor 20 mg.  Trying to follow cardiac diet.  No other new complaints   Not taking Vit D ?  ?Wt Readings from Last 3 Encounters:  ?07/20/21 208 lb (94.3 kg)  ?04/19/21 203 lb (92.1 kg)  ?10/18/20 197 lb 12.8 oz (89.7 kg)  ? ?BP Readings from Last 3 Encounters:  ?07/20/21 (!) 170/80  ?04/19/21 (!) 142/98  ?10/18/20 (!) 158/100  ? ?Immunization History  ?Administered Date(s) Administered  ? Fluad Quad(high Dose 65+) 01/14/2019  ? Influenza Split 03/15/2012  ? Influenza Whole 03/08/2006, 03/09/2008, 02/05/2009  ? Influenza, High Dose Seasonal PF 02/07/2018, 02/17/2021  ? Influenza,inj,Quad PF,6+ Mos 02/03/2013, 02/27/2014, 02/23/2015, 02/24/2016, 02/06/2017  ? Influenza-Unspecified 02/07/2018  ? PFIZER(Purple Top)SARS-COV-2 Vaccination 05/20/2019, 06/09/2019  ? Pension scheme manager 4yr & up 02/24/2021  ? Pneumococcal Conjugate-13 07/15/2013, 04/08/2014  ? Pneumococcal Polysaccharide-23 09/23/2015  ? Td 04/20/2008  ? Tdap 06/12/2018  ? Zoster, Live 05/06/2010  ? ?There are no preventive care reminders to display for this patient. ? ?  ? ?Past Medical History:  ?Diagnosis Date  ? BENIGN PROSTATIC HYPERTROPHY 05/06/2010  ? COMMON MIGRAINE 07/31/2007  ? GERD 07/31/2007  ? HYPERLIPIDEMIA 01/04/2007  ? HYPERTENSION 01/04/2007  ? OVERACTIVE BLADDER 03/06/2007   ? [place]  ? ?Past Surgical History:  ?Procedure Laterality Date  ? COLONOSCOPY  2009  ? ? reports that he has never smoked. He has never used smokeless tobacco. He reports that he does not drink alcohol and does not use drugs. ?family history includes Heart disease (age of onset: 648 in his brother; Lung cancer (age of onset: 664 in his father. ?Allergies  ?Allergen Reactions  ? Celebrex [Celecoxib] Other (See Comments)  ?  Stroke like symptoms  ? Crestor [Rosuvastatin]   ?  weakness  ? Detrol [Tolterodine] Other (See Comments)  ?  headache  ? Ditropan [Oxybutynin] Other (See Comments)  ?  Dry mouth  ? Norvasc [Amlodipine Besylate] Swelling  ? Tadalafil Other (See Comments)  ?  headache  ? Toprol Xl [Metoprolol] Other (See Comments)  ?  dizzy  ? ?Current Outpatient Medications on File Prior to Visit  ?Medication Sig Dispense Refill  ? aspirin 81 MG tablet Take 81 mg by mouth daily.     ? dorzolamide-timolol (COSOPT) 22.3-6.8 MG/ML ophthalmic solution 1 drop 2 (two) times daily.    ? latanoprost (XALATAN) 0.005 % ophthalmic solution Place 1 drop into the right eye daily.  1  ? polyethylene glycol (MIRALAX / GLYCOLAX) packet Take 17 g by mouth as needed.     ? sildenafil (REVATIO) 20 MG tablet TAKE THREE TABLETS BY MOUTH AS NEEDED 90 tablet 11  ? SYSTANE ULTRA 0.4-0.3 % SOLN SMARTSIG:1  Drop(s) In Eye(s) PRN    ? tamsulosin (FLOMAX) 0.4 MG CAPS capsule Take 1 capsule (0.4 mg total) by mouth daily. 90 capsule 3  ? ?No current facility-administered medications on file prior to visit.  ? ?     ROS:  All others reviewed and negative. ? ?Objective  ? ?     PE:  BP (!) 170/80 (BP Location: Right Arm, Patient Position: Sitting, Cuff Size: Large)   Pulse (!) 45   Temp 97.8 ?F (36.6 ?C) (Oral)   Ht '5\' 7"'$  (1.702 m)   Wt 208 lb (94.3 kg)   SpO2 98%   BMI 32.58 kg/m?  ? ?              Constitutional: Pt appears in NAD ?              HENT: Head: NCAT.  ?              Right Ear: External ear normal.   ?               Left Ear: External ear normal.  ?              Eyes: . Pupils are equal, round, and reactive to light. Conjunctivae and EOM are normal ?              Nose: without d/c or deformity ?              Neck: Neck supple. Gross normal ROM ?              Cardiovascular: Normal rate and regular rhythm.   ?              Pulmonary/Chest: Effort normal and breath sounds without rales or wheezing.  ?              Abd:  Soft, NT, ND, + BS, no organomegaly ?              Neurological: Pt is alert. At baseline orientation, motor grossly intact ?              Skin: Skin is warm. No rashes, no other new lesions, LE edema -none ?              Psychiatric: Pt behavior is normal without agitation  ? ?Micro: none ? ?Cardiac tracings I have personally interpreted today:  none ? ?Pertinent Radiological findings (summarize): none  ? ?Lab Results  ?Component Value Date  ? WBC 7.1 07/20/2021  ? HGB 15.4 07/20/2021  ? HCT 45.6 07/20/2021  ? PLT 239.0 07/20/2021  ? GLUCOSE 115 (H) 07/20/2021  ? CHOL 181 07/20/2021  ? TRIG 85.0 07/20/2021  ? HDL 62.90 07/20/2021  ? LDLDIRECT 139.2 10/29/2012  ? LDLCALC 101 (H) 07/20/2021  ? ALT 20 07/20/2021  ? AST 22 07/20/2021  ? NA 139 07/20/2021  ? K 3.4 (L) 07/20/2021  ? CL 99 07/20/2021  ? CREATININE 1.19 07/20/2021  ? BUN 15 07/20/2021  ? CO2 31 07/20/2021  ? TSH 1.45 07/20/2021  ? PSA 0.22 07/20/2021  ? INR 1.0 06/24/2007  ? HGBA1C 6.3 07/20/2021  ? ?Assessment/Plan:  ?[name] [name] is a 71 y.o. Black or African American [2] male with  has a past medical history of BENIGN PROSTATIC HYPERTROPHY (05/06/2010), COMMON MIGRAINE (07/31/2007), GERD (07/31/2007), HYPERLIPIDEMIA (01/04/2007), HYPERTENSION (01/04/2007), OVERACTIVE BLADDER (03/06/2007), and Prostate cancer ([place]) (02/04/15). ? ?Vitamin D deficiency ?Last vitamin D ?Lab Results  ?Component  Value Date  ? VD25OH 21.09 (L) 10/18/2020  ? ?Low, to start oral replacement ? ? ?Hyperglycemia ?Lab Results  ?Component Value Date  ? HGBA1C 6.3  07/20/2021  ? ?Stable, pt to continue current medical treatment  - diet ? ? ?Hypercholesterolemia ?Lab Results  ?Component Value Date  ? LDLCALC 101 (H) 07/20/2021  ? ?Mild uncontrolled, goal ldl < 100, pt to continue current statin lipitor 20 as declines to change, for improved diet ? ? ?Bradycardia ?Mild asympt but d/c bystolic ? ?Labile hypertension ?Overall iimproved but still mild elevated in AM - will change losartan hct to max dose benicar hct, and continue to monitor (off bystolic as well); has had swelling with amlodipine 5 so does not want restart, will consider add hydralazine for persistent elevation - pt to call 1-2 wks ? ?Followup: Return in about 4 weeks (around 08/17/2021). ? ?[name], MD 07/23/2021 12:29 PM ?[place] [facility] ?

## 2021-07-20 NOTE — Patient Instructions (Signed)
Ok to stop the bystolic (nebivolol) ? ?Ok to stop the losartan HCT 100-12.'5mg'$  ? ?Please take all new medication as prescribed - the more effective olmesartan -HCT 40/25 mg - 1 per day ? ?Please continue all other medications as before, and refills have been done if requested. ? ?Please have the pharmacy call with any other refills you may need. ? ?Please continue your efforts at being more active, low cholesterol diet, and weight control. ? ?You are otherwise up to date with prevention measures today. ? ?Please keep your appointments with your specialists as you may have planned ? ?Please go to the LAB at the blood drawing area for the tests to be done ? ?You will be contacted by phone if any changes need to be made immediately.  Otherwise, you will receive a letter about your results with an explanation, but please check with MyChart first. ? ?Please remember to sign up for MyChart if you have not done so, as this will be important to you in the future with finding out test results, communicating by private email, and scheduling acute appointments online when needed. ? ?Please make an Appointment to return in 1 months, or sooner if needed ?

## 2021-07-20 NOTE — Assessment & Plan Note (Signed)
Last vitamin D ?Lab Results  ?Component Value Date  ? VD25OH 21.09 (L) 10/18/2020  ? ?Low, to start oral replacement ? ?

## 2021-07-23 ENCOUNTER — Encounter: Payer: Self-pay | Admitting: Internal Medicine

## 2021-07-23 NOTE — Assessment & Plan Note (Signed)
Mild asympt but d/c bystolic ?

## 2021-07-23 NOTE — Assessment & Plan Note (Signed)
Overall iimproved but still mild elevated in AM - will change losartan hct to max dose benicar hct, and continue to monitor (off bystolic as well); has had swelling with amlodipine 5 so does not want restart, will consider add hydralazine for persistent elevation - pt to call 1-2 wks ?

## 2021-07-23 NOTE — Assessment & Plan Note (Signed)
Lab Results  ?Component Value Date  ? LDLCALC 101 (H) 07/20/2021  ? ?Mild uncontrolled, goal ldl < 100, pt to continue current statin lipitor 20 as declines to change, for improved diet ? ?

## 2021-07-23 NOTE — Assessment & Plan Note (Signed)
Lab Results  ?Component Value Date  ? HGBA1C 6.3 07/20/2021  ? ?Stable, pt to continue current medical treatment  - diet ? ?

## 2021-07-25 ENCOUNTER — Encounter: Payer: Self-pay | Admitting: Internal Medicine

## 2021-07-26 MED ORDER — HYDRALAZINE HCL 50 MG PO TABS: 50.0000 mg | ORAL_TABLET | Freq: Three times a day (TID) | ORAL | 11 refills | Status: DC

## 2021-07-26 NOTE — Telephone Encounter (Signed)
Unfortuanately it can lead to higher BP but the only way to know if this will happen is to take the medication and monitor the BP at home maybe 2-3 times per wk over the first 3 months.  So I would encourage this if the pt has a concern, though the chance of this is low ?

## 2021-08-01 ENCOUNTER — Encounter: Payer: Self-pay | Admitting: Internal Medicine

## 2021-08-01 MED ORDER — NEBIVOLOL HCL 2.5 MG PO TABS: 2.5000 mg | ORAL_TABLET | Freq: Every day | ORAL | 3 refills | Status: DC

## 2021-08-09 DIAGNOSIS — H0102A Squamous blepharitis right eye, upper and lower eyelids: Secondary | ICD-10-CM | POA: Diagnosis not present

## 2021-08-09 DIAGNOSIS — H401131 Primary open-angle glaucoma, bilateral, mild stage: Secondary | ICD-10-CM | POA: Diagnosis not present

## 2021-08-09 DIAGNOSIS — H35352 Cystoid macular degeneration, left eye: Secondary | ICD-10-CM | POA: Diagnosis not present

## 2021-08-09 DIAGNOSIS — H35373 Puckering of macula, bilateral: Secondary | ICD-10-CM | POA: Diagnosis not present

## 2021-08-09 DIAGNOSIS — H26491 Other secondary cataract, right eye: Secondary | ICD-10-CM | POA: Diagnosis not present

## 2021-08-09 DIAGNOSIS — H0102B Squamous blepharitis left eye, upper and lower eyelids: Secondary | ICD-10-CM | POA: Diagnosis not present

## 2021-08-09 DIAGNOSIS — H02831 Dermatochalasis of right upper eyelid: Secondary | ICD-10-CM | POA: Diagnosis not present

## 2021-08-09 DIAGNOSIS — H02834 Dermatochalasis of left upper eyelid: Secondary | ICD-10-CM | POA: Diagnosis not present

## 2021-08-09 DIAGNOSIS — I10 Essential (primary) hypertension: Secondary | ICD-10-CM | POA: Diagnosis not present

## 2021-08-09 DIAGNOSIS — Z961 Presence of intraocular lens: Secondary | ICD-10-CM | POA: Diagnosis not present

## 2021-08-26 ENCOUNTER — Encounter: Payer: Self-pay | Admitting: Internal Medicine

## 2021-08-31 MED ORDER — HYDROCHLOROTHIAZIDE 12.5 MG PO CAPS: 12.5000 mg | ORAL_CAPSULE | Freq: Every day | ORAL | 3 refills | Status: DC

## 2021-08-31 MED ORDER — BENAZEPRIL HCL 40 MG PO TABS: 40.0000 mg | ORAL_TABLET | Freq: Every day | ORAL | 3 refills | Status: DC

## 2021-09-30 ENCOUNTER — Ambulatory Visit (INDEPENDENT_AMBULATORY_CARE_PROVIDER_SITE_OTHER): Payer: Medicare Other | Admitting: *Deleted

## 2021-09-30 ENCOUNTER — Encounter: Payer: Self-pay | Admitting: *Deleted

## 2021-09-30 VITALS — Ht 67.0 in

## 2021-09-30 DIAGNOSIS — Z0000 Encounter for general adult medical examination without abnormal findings: Secondary | ICD-10-CM

## 2021-09-30 NOTE — Progress Notes (Signed)
Subjective:   [name] [name] is a 71 y.o. male who presents for Medicare Annual/Subsequent preventive examination. I connected with  [name] [name] on 09/30/21 by a audio enabled telemedicine application and verified that I am speaking with the correct person using two identifiers.  Patient Location: Home  Provider Location: Office/Clinic  I discussed the limitations of evaluation and management by telemedicine. The patient expressed understanding and agreed to proceed.   Review of System Defer to PCP        Objective:    Today's Vitals   09/30/21 1016  Height: '5\' 7"'$  (1.702 m)   Body mass index is 32.58 kg/m.     09/30/2021   10:24 AM 09/25/2018   10:33 AM 08/13/2017    9:34 AM 08/11/2016   10:19 AM 08/05/2015    8:58 AM 02/23/2015    7:36 AM  Advanced Directives  Does Patient Have a Medical Advance Directive? No No No No No No  Does patient want to make changes to medical advance directive?   Yes (ED - Information included in AVS)     Would patient like information on creating a medical advance directive? No - Patient declined Yes (ED - Information included in AVS)  Yes (ED - Information included in AVS) Yes - Educational materials given No - patient declined information    Current Medications (verified) Outpatient Encounter Medications as of 09/30/2021  Medication Sig   aspirin 81 MG tablet Take 81 mg by mouth daily.    atorvastatin (LIPITOR) 40 MG tablet Take 1 tablet (40 mg total) by mouth daily.   benazepril (LOTENSIN) 40 MG tablet Take 1 tablet (40 mg total) by mouth daily.   Cholecalciferol 50 MCG (2000 UT) TABS 1 tab by mouth once daily   dorzolamide-timolol (COSOPT) 22.3-6.8 MG/ML ophthalmic solution 1 drop 2 (two) times daily.   hydrALAZINE (APRESOLINE) 50 MG tablet Take 1 tablet (50 mg total) by mouth 3 (three) times daily.   hydrochlorothiazide (MICROZIDE) 12.5 MG capsule Take 1 capsule (12.5 mg total) by mouth daily.   latanoprost (XALATAN) 0.005 %  ophthalmic solution Place 1 drop into the right eye daily.   potassium chloride (KLOR-CON 10) 10 MEQ tablet Take 1 tablet (10 mEq total) by mouth daily.   sildenafil (REVATIO) 20 MG tablet TAKE THREE TABLETS BY MOUTH AS NEEDED   SYSTANE ULTRA 0.4-0.3 % SOLN SMARTSIG:1 Drop(s) In Eye(s) PRN   tamsulosin (FLOMAX) 0.4 MG CAPS capsule Take 1 capsule (0.4 mg total) by mouth daily.   MYRBETRIQ 50 MG TB24 tablet Take 50 mg by mouth daily.   polyethylene glycol (MIRALAX / GLYCOLAX) packet Take 17 g by mouth as needed.  (Patient not taking: Reported on 09/30/2021)   No facility-administered encounter medications on file as of 09/30/2021.    Allergies (verified) Beta adrenergic blockers, Celebrex [celecoxib], Crestor [rosuvastatin], Detrol [tolterodine], Ditropan [oxybutynin], Norvasc [amlodipine besylate], Tadalafil, and Toprol xl [metoprolol]   History: Past Medical History:  Diagnosis Date   BENIGN PROSTATIC HYPERTROPHY 05/06/2010   COMMON MIGRAINE 07/31/2007   GERD 07/31/2007   HYPERLIPIDEMIA 01/04/2007   HYPERTENSION 01/04/2007   OVERACTIVE BLADDER 03/06/2007   Prostate cancer ([place]) 02/04/15   Past Surgical History:  Procedure Laterality Date   COLONOSCOPY  2009   Family History  Problem Relation Age of Onset   Lung cancer Father 54   Heart disease Brother 74       died from MI    Colon cancer Neg Hx    Esophageal  cancer Neg Hx    Rectal cancer Neg Hx    Stomach cancer Neg Hx    Social History   Socioeconomic History   Marital status: Married    Spouse name: Not on file   Number of children: 3   Years of education: Not on file   Highest education level: Not on file  Occupational History   Occupation: pastor  Tobacco Use   Smoking status: Never   Smokeless tobacco: Never  Vaping Use   Vaping Use: Never used  Substance and Sexual Activity   Alcohol use: No    Alcohol/week: 0.0 standard drinks   Drug use: No   Sexual activity: Yes  Other Topics Concern   Not on file   Social History Narrative   Not on file   Social Determinants of Health   Financial Resource Strain: Low Risk    Difficulty of Paying Living Expenses: Not hard at all  Food Insecurity: No Food Insecurity   Worried About Charity fundraiser in the Last Year: Never true   [place] in the Last Year: Never true  Transportation Needs: No Transportation Needs   Lack of Transportation (Medical): No   Lack of Transportation (Non-Medical): No  Physical Activity: Insufficiently Active   Days of Exercise per Week: 2 days   Minutes of Exercise per Session: 30 min  Stress: No Stress Concern Present   Feeling of Stress : Not at all  Social Connections: Socially Integrated   Frequency of Communication with Friends and Family: More than three times a week   Frequency of Social Gatherings with Friends and Family: Three times a week   Attends Religious Services: More than 4 times per year   Active Member of Clubs or Organizations: No   Attends Archivist Meetings: 1 to 4 times per year   Marital Status: Married    Tobacco Counseling Counseling given: Not Answered   Clinical Intake:  Pre-visit preparation completed: No  Pain : No/denies pain     Nutritional Risks: None Diabetes: No  How often do you need to have someone help you when you read instructions, pamphlets, or other written materials from your doctor or pharmacy?: 1 - Never What is the last grade level you completed in school?: 12th  Diabetic?no  Interpreter Needed?: No      Activities of Daily Living    09/30/2021   10:24 AM 10/18/2020    9:01 AM  In your present state of health, do you have any difficulty performing the following activities:  Hearing? 1 0  Comment pt wears hearing aides   Vision? 0 0  Difficulty concentrating or making decisions? 0 0  Walking or climbing stairs? 0 0  Dressing or bathing? 0 0  Doing errands, shopping? 0 0    Patient Care Team: Biagio Borg, MD as PCP -  General Buford Dresser, MD as PCP - Cardiology (Cardiology) Irene Shipper, MD as Consulting Physician (Gastroenterology) Trula Slade, DPM as Consulting Physician (Podiatry)  Indicate any recent Medical Services you may have received from other than [facility] providers in the past year (date may be approximate).     Assessment:   This is a routine wellness examination for [place].  Hearing/Vision screen No results found.  Dietary issues and exercise activities discussed: Current Exercise Habits: The patient does not participate in regular exercise at present, Exercise limited by: None identified   Goals Addressed   None   Depression Screen  09/30/2021   10:22 AM 09/30/2021   10:10 AM 07/20/2021    8:21 AM 10/18/2020    9:43 AM 10/18/2020    9:01 AM 09/25/2019    9:18 AM 03/28/2019    8:55 AM  PHQ 2/9 Scores  PHQ - 2 Score 0 0 0 0 0 0 0    Fall Risk    09/30/2021   10:06 AM 07/20/2021    8:21 AM 10/18/2020    9:43 AM 10/18/2020    9:01 AM 09/25/2019    9:18 AM  Fall Risk   Falls in the past year? 0 0 0 0 0  Number falls in past yr:  0 0 0 0  Injury with Fall?  0 0 0 0  Risk for fall due to :    No Fall Risks No Fall Risks  Follow up     Falls evaluation completed    FALL RISK PREVENTION PERTAINING TO THE HOME:  Any stairs in or around the home? No  If so, are there any without handrails? No  Home free of loose throw rugs in walkways, pet beds, electrical cords, etc? No  Adequate lighting in your home to reduce risk of falls? Yes   ASSISTIVE DEVICES UTILIZED TO PREVENT FALLS:  Life alert? No  Use of a cane, walker or w/c? No  Grab bars in the bathroom? No  Shower chair or bench in shower? No  Elevated toilet seat or a handicapped toilet? No   TIMED UP AND GO:  Was the test performed? No .  Length of time to ambulate 10 feet: n/a sec.   Cognitive Function:        09/30/2021   10:25 AM  6CIT Screen  What Year? 0 points  What month? 0 points   What time? 0 points  Count back from 20 0 points  Months in reverse 2 points  Repeat phrase 0 points  Total Score 2 points    Immunizations Immunization History  Administered Date(s) Administered   Fluad Quad(high Dose 65+) 01/14/2019   Influenza Split 03/15/2012   Influenza Whole 03/08/2006, 03/09/2008, 02/05/2009   Influenza, High Dose Seasonal PF 02/07/2018, 02/17/2021   Influenza,inj,Quad PF,6+ Mos 02/03/2013, 02/27/2014, 02/23/2015, 02/24/2016, 02/06/2017   Influenza-Unspecified 02/07/2018   PFIZER(Purple Top)SARS-COV-2 Vaccination 05/20/2019, 06/09/2019   Pfizer Covid-19 Vaccine Bivalent Booster 40yr & up 02/24/2021   Pneumococcal Conjugate-13 07/15/2013, 04/08/2014   Pneumococcal Polysaccharide-23 09/23/2015   Td 04/20/2008   Tdap 06/12/2018   Zoster, Live 05/06/2010    TDAP status: Up to date  Flu Vaccine status: Up to date  Pneumococcal vaccine status: Up to date  Covid-19 vaccine status: Completed vaccines  Qualifies for Shingles Vaccine? Yes   Zostavax completed No   Shingrix Completed?: No.    Education has been provided regarding the importance of this vaccine. Patient has been advised to call insurance company to determine out of pocket expense if they have not yet received this vaccine. Advised may also receive vaccine at local pharmacy or Health Dept. Verbalized acceptance and understanding.  Screening Tests Health Maintenance  Topic Date Due   COVID-19 Vaccine (4 - Booster for Pfizer series) 04/21/2021   Zoster Vaccines- Shingrix (1 of 2) 10/20/2021 (Originally 05/14/1969)   INFLUENZA VACCINE  12/06/2021   COLONOSCOPY (Pts 45-488yrInsurance coverage will need to be confirmed)  12/20/2027   TETANUS/TDAP  06/12/2028   Pneumonia Vaccine 6527Years old  Completed   Hepatitis C Screening  Completed   HPV VACCINES  Aged Out    Health Maintenance  Health Maintenance Due  Topic Date Due   COVID-19 Vaccine (4 - Booster for Pfizer series) 04/21/2021     Colorectal cancer screening: Type of screening: Colonoscopy. Completed 12/19/2017. Repeat every 10 years  Lung Cancer Screening: (Low Dose CT Chest recommended if Age 50-80 years, 30 pack-year currently smoking OR have quit w/in 15years.) does not qualify.   Lung Cancer Screening Referral: no  Additional Screening:  Hepatitis C Screening: does qualify; Completed 03/26/2015  Vision Screening: Recommended annual ophthalmology exams for early detection of glaucoma and other disorders of the eye. Is the patient up to date with their annual eye exam?  Yes  Who is the provider or what is the name of the office in which the patient attends annual eye exams? Dr. Katy Fitch If pt is not established with a provider, would they like to be referred to a provider to establish care? No .   Dental Screening: Recommended annual dental exams for proper oral hygiene  Community Resource Referral / Chronic Care Management: CRR required this visit?  No   CCM required this visit?  No      Plan:     I have personally reviewed and noted the following in the patient's chart:   Medical and social history Use of alcohol, tobacco or illicit drugs  Current medications and supplements including opioid prescriptions. Patient is not currently taking opioid prescriptions. Functional ability and status Nutritional status Physical activity Advanced directives List of other physicians Hospitalizations, surgeries, and ER visits in previous 12 months Vitals Screenings to include cognitive, depression, and falls Referrals and appointments  In addition, I have reviewed and discussed with patient certain preventive protocols, quality metrics, and best practice recommendations. A written personalized care plan for preventive services as well as general preventive health recommendations were provided to patient.     Mylinda Latina, [place]   09/30/2021   Mr. Danelle Berry , Thank you for taking time to come for your  Medicare Wellness Visit. I appreciate your ongoing commitment to your health goals. Please review the following plan we discussed and let me know if I can assist you in the future.   These are the goals we discussed:  Goals      Patient Stated     Lose weight by monitoring my diet and eat healthy, drink a glass of water before each meal and use portion control. getting back on my treadmill, enjoy life family, worship God.      reduce stomach fat     Limit sugar and carbohydrates and do sit-ups         This is a list of the screening recommended for you and due dates:  Health Maintenance  Topic Date Due   COVID-19 Vaccine (4 - Booster for Pfizer series) 04/21/2021   Zoster (Shingles) Vaccine (1 of 2) 10/20/2021*   Flu Shot  12/06/2021   Colon Cancer Screening  12/20/2027   Tetanus Vaccine  06/12/2028   Pneumonia Vaccine  Completed   Hepatitis C Screening: USPSTF Recommendation to screen - Ages 18-79 yo.  Completed   HPV Vaccine  Aged Out  *Topic was postponed. The date shown is not the original due date.

## 2021-09-30 NOTE — Patient Instructions (Signed)
Health Maintenance, Male Adopting a healthy lifestyle and getting preventive care are important in promoting health and wellness. Ask your health care provider about: The right schedule for you to have regular tests and exams. Things you can do on your own to prevent diseases and keep yourself healthy. What should I know about diet, weight, and exercise? Eat a healthy diet  Eat a diet that includes plenty of vegetables, fruits, low-fat dairy products, and lean protein. Do not eat a lot of foods that are high in solid fats, added sugars, or sodium. Maintain a healthy weight Body mass index (BMI) is a measurement that can be used to identify possible weight problems. It estimates body fat based on height and weight. Your health care provider can help determine your BMI and help you achieve or maintain a healthy weight. Get regular exercise Get regular exercise. This is one of the most important things you can do for your health. Most adults should: Exercise for at least 150 minutes each week. The exercise should increase your heart rate and make you sweat (moderate-intensity exercise). Do strengthening exercises at least twice a week. This is in addition to the moderate-intensity exercise. Spend less time sitting. Even light physical activity can be beneficial. Watch cholesterol and blood lipids Have your blood tested for lipids and cholesterol at 73 years of age, then have this test every 5 years. You may need to have your cholesterol levels checked more often if: Your lipid or cholesterol levels are high. You are older than 73 years of age. You are at high risk for heart disease. What should I know about cancer screening? Many types of cancers can be detected early and may often be prevented. Depending on your health history and family history, you may need to have cancer screening at various ages. This may include screening for: Colorectal cancer. Prostate cancer. Skin cancer. Lung  cancer. What should I know about heart disease, diabetes, and high blood pressure? Blood pressure and heart disease High blood pressure causes heart disease and increases the risk of stroke. This is more likely to develop in people who have high blood pressure readings or are overweight. Talk with your health care provider about your target blood pressure readings. Have your blood pressure checked: Every 3-5 years if you are 18-39 years of age. Every year if you are 40 years old or older. If you are between the ages of 65 and 75 and are a current or former smoker, ask your health care provider if you should have a one-time screening for abdominal aortic aneurysm (AAA). Diabetes Have regular diabetes screenings. This checks your fasting blood sugar level. Have the screening done: Once every three years after age 45 if you are at a normal weight and have a low risk for diabetes. More often and at a younger age if you are overweight or have a high risk for diabetes. What should I know about preventing infection? Hepatitis B If you have a higher risk for hepatitis B, you should be screened for this virus. Talk with your health care provider to find out if you are at risk for hepatitis B infection. Hepatitis C Blood testing is recommended for: Everyone born from 1945 through 1965. Anyone with known risk factors for hepatitis C. Sexually transmitted infections (STIs) You should be screened each year for STIs, including gonorrhea and chlamydia, if: You are sexually active and are younger than 73 years of age. You are older than 73 years of age and your   health care provider tells you that you are at risk for this type of infection. Your sexual activity has changed since you were last screened, and you are at increased risk for chlamydia or gonorrhea. Ask your health care provider if you are at risk. Ask your health care provider about whether you are at high risk for HIV. Your health care provider  may recommend a prescription medicine to help prevent HIV infection. If you choose to take medicine to prevent HIV, you should first get tested for HIV. You should then be tested every 3 months for as long as you are taking the medicine. Follow these instructions at home: Alcohol use Do not drink alcohol if your health care provider tells you not to drink. If you drink alcohol: Limit how much you have to 0-2 drinks a day. Know how much alcohol is in your drink. In the [place] one drink equals one 12 oz bottle of beer (355 mL), one 5 oz glass of wine (148 mL), or one 1 oz glass of hard liquor (44 mL). Lifestyle Do not use any products that contain nicotine or tobacco. These products include cigarettes, chewing tobacco, and vaping devices, such as e-cigarettes. If you need help quitting, ask your health care provider. Do not use street drugs. Do not share needles. Ask your health care provider for help if you need support or information about quitting drugs. General instructions Schedule regular health, dental, and eye exams. Stay current with your vaccines. Tell your health care provider if: You often feel depressed. You have ever been abused or do not feel safe at home. Summary Adopting a healthy lifestyle and getting preventive care are important in promoting health and wellness. Follow your health care provider's instructions about healthy diet, exercising, and getting tested or screened for diseases. Follow your health care provider's instructions on monitoring your cholesterol and blood pressure. This information is not intended to replace advice given to you by your health care provider. Make sure you discuss any questions you have with your health care provider. Document Revised: 09/13/2020 Document Reviewed: 09/13/2020 Elsevier Patient Education  2023 [employer]  

## 2021-10-10 ENCOUNTER — Ambulatory Visit: Payer: Medicare Other | Admitting: Internal Medicine

## 2021-10-14 ENCOUNTER — Other Ambulatory Visit: Payer: Self-pay | Admitting: Internal Medicine

## 2021-10-17 ENCOUNTER — Ambulatory Visit (INDEPENDENT_AMBULATORY_CARE_PROVIDER_SITE_OTHER): Payer: Medicare Other | Admitting: Internal Medicine

## 2021-10-17 ENCOUNTER — Encounter: Payer: Self-pay | Admitting: Internal Medicine

## 2021-10-17 VITALS — BP 152/88 | HR 59 | Temp 97.7°F | Ht 67.0 in | Wt 197.0 lb

## 2021-10-17 DIAGNOSIS — R0989 Other specified symptoms and signs involving the circulatory and respiratory systems: Secondary | ICD-10-CM | POA: Diagnosis not present

## 2021-10-17 DIAGNOSIS — R739 Hyperglycemia, unspecified: Secondary | ICD-10-CM

## 2021-10-17 DIAGNOSIS — R3129 Other microscopic hematuria: Secondary | ICD-10-CM

## 2021-10-17 DIAGNOSIS — E559 Vitamin D deficiency, unspecified: Secondary | ICD-10-CM | POA: Diagnosis not present

## 2021-10-17 DIAGNOSIS — E7800 Pure hypercholesterolemia, unspecified: Secondary | ICD-10-CM

## 2021-10-17 LAB — BASIC METABOLIC PANEL
BUN: 17 mg/dL (ref 6–23)
CO2: 30 mEq/L (ref 19–32)
Calcium: 9.8 mg/dL (ref 8.4–10.5)
Chloride: 99 mEq/L (ref 96–112)
Creatinine, Ser: 1.15 mg/dL (ref 0.40–1.50)
GFR: 63.17 mL/min (ref >60.00)
Glucose, Bld: 108 mg/dL — ABNORMAL HIGH (ref 70–99)
Potassium: 3.5 mEq/L (ref 3.5–5.1)
Sodium: 139 mEq/L (ref 135–145)

## 2021-10-17 LAB — CBC WITH DIFFERENTIAL/PLATELET
Basophils Absolute: 0 10*3/uL (ref 0.0–0.1)
Basophils Relative: 0.3 % (ref 0.0–3.0)
Eosinophils Absolute: 0 10*3/uL (ref 0.0–0.7)
Eosinophils Relative: 0.4 % (ref 0.0–5.0)
HCT: 44.2 % (ref 39.0–52.0)
Hemoglobin: 14.9 g/dL (ref 13.0–17.0)
Lymphocytes Relative: 21.7 % (ref 12.0–46.0)
Lymphs Abs: 1.5 10*3/uL (ref 0.7–4.0)
MCHC: 33.6 g/dL (ref 30.0–36.0)
MCV: 93.5 fl (ref 78.0–100.0)
Monocytes Absolute: 0.6 10*3/uL (ref 0.1–1.0)
Monocytes Relative: 8.3 % (ref 3.0–12.0)
Neutro Abs: 4.9 10*3/uL (ref 1.4–7.7)
Neutrophils Relative %: 69.3 % (ref 43.0–77.0)
Platelets: 238 10*3/uL (ref 150.0–400.0)
RBC: 4.73 Mil/uL (ref 4.22–5.81)
RDW: 13.8 % (ref 11.5–15.5)
WBC: 7.1 10*3/uL (ref 4.0–10.5)

## 2021-10-17 LAB — HEPATIC FUNCTION PANEL
ALT: 16 U/L (ref 0–53)
AST: 17 U/L (ref 0–37)
Albumin: 4.4 g/dL (ref 3.5–5.2)
Alkaline Phosphatase: 56 U/L (ref 39–117)
Bilirubin, Direct: 0.3 mg/dL (ref 0.0–0.3)
Total Bilirubin: 1.4 mg/dL — ABNORMAL HIGH (ref 0.2–1.2)
Total Protein: 7.7 g/dL (ref 6.0–8.3)

## 2021-10-17 LAB — LIPID PANEL
Cholesterol: 151 mg/dL (ref 0–200)
HDL: 57.4 mg/dL (ref >39.00)
LDL Cholesterol: 80 mg/dL (ref 0–99)
NonHDL: 93.46
Total CHOL/HDL Ratio: 3
Triglycerides: 66 mg/dL (ref 0.0–149.0)
VLDL: 13.2 mg/dL (ref 0.0–40.0)

## 2021-10-17 LAB — HEMOGLOBIN A1C: Hgb A1c MFr Bld: 6 % (ref 4.6–6.5)

## 2021-10-17 LAB — VITAMIN D 25 HYDROXY (VIT D DEFICIENCY, FRACTURES): VITD: 45.37 ng/mL (ref 30.00–100.00)

## 2021-10-17 NOTE — Assessment & Plan Note (Signed)
Lab Results  Component Value Date   HGBA1C 6.3 07/20/2021   Stable, pt to continue current medical treatment  - diet, wt control, excercise

## 2021-10-17 NOTE — Patient Instructions (Signed)
Ok to take the benazepril 40 mg in the evening, instead of the AM, to help the BP the next AM  Please continue all other medications as before, and refills have been done if requested.  Please have the pharmacy call with any other refills you may need.  Please keep your appointments with your specialists as you may have planned  You will be contacted regarding the referral for: CT scan for the blood in the urine  Please go to the LAB at the blood drawing area for the tests to be done  You will be contacted by phone if any changes need to be made immediately.  Otherwise, you will receive a letter about your results with an explanation, but please check with MyChart first.  Please remember to sign up for MyChart if you have not done so, as this will be important to you in the future with finding out test results, communicating by private email, and scheduling acute appointments online when needed.  Please make an Appointment to return in 6 months, or sooner if needed

## 2021-10-17 NOTE — Assessment & Plan Note (Signed)
Uncontrolled in the AM, for change of benazepril 40 mg to the PM, cont all other med tx as is - hydralazine 50 tid, hct 12.5 qd, and cont to monitor  BP Readings from Last 3 Encounters:  10/17/21 (!) 152/88  07/20/21 (!) 170/80  04/19/21 (!) 142/98

## 2021-10-17 NOTE — Assessment & Plan Note (Signed)
Last vitamin D Lab Results  Component Value Date   VD25OH 32.08 07/20/2021   Low, to start oral replacement

## 2021-10-17 NOTE — Assessment & Plan Note (Signed)
Lab Results  Component Value Date   LDLCALC 101 (H) 07/20/2021   Uncontrolled, goal ldl < 70, now increased statin, for f/u lab today pt to continue current statin lipitor 40

## 2021-10-17 NOTE — Progress Notes (Signed)
Patient ID: [name] [name], male   DOB: 24-Jan-1951, 71 y.o.   MRN: [id number]        Chief Complaint: follow up HTN, HLD and hyperglycemia, low vit d       HPI:  [name] [name] is a 71 y.o. male here with c/o  SBP has been mosty 140-150 in am, then just less than 130 each evening about dinnertime.    Pt denies chest pain, increased sob or doe, wheezing, orthopnea, PND, increased LE swelling, palpitations, dizziness or syncope.   Pt denies polydipsia, polyuria, or new focal neuro s/s. Denies urinary symptoms such as dysuria, frequency, urgency, flank pain, hematuria or n/v, fever, chills and Pt states followed by urology regularly per pt with UAs done, and are aware of microhematuria per pt but he is asking for CT f/u and then will f/u with them again, as it has been many years > 5 yrs since last CT.  Taking Vit D.  Tolerating increased statin in mar 2023.   Wt Readings from Last 3 Encounters:  10/17/21 197 lb (89.4 kg)  07/20/21 208 lb (94.3 kg)  04/19/21 203 lb (92.1 kg)   BP Readings from Last 3 Encounters:  10/17/21 (!) 152/88  07/20/21 (!) 170/80  04/19/21 (!) 142/98         Past Medical History:  Diagnosis Date   BENIGN PROSTATIC HYPERTROPHY 05/06/2010   COMMON MIGRAINE 07/31/2007   GERD 07/31/2007   HYPERLIPIDEMIA 01/04/2007   HYPERTENSION 01/04/2007   OVERACTIVE BLADDER 03/06/2007   Prostate cancer ([place]) 02/04/15   Past Surgical History:  Procedure Laterality Date   COLONOSCOPY  2009    reports that he has never smoked. He has never used smokeless tobacco. He reports that he does not drink alcohol and does not use drugs. family history includes Heart disease (age of onset: 63) in his brother; Lung cancer (age of onset: 76) in his father. Allergies  Allergen Reactions   Beta Adrenergic Blockers Other (See Comments)    bradycardia   Celebrex [Celecoxib] Other (See Comments)    Stroke like symptoms   Crestor [Rosuvastatin]     weakness   Detrol [Tolterodine] Other (See  Comments)    headache   Ditropan [Oxybutynin] Other (See Comments)    Dry mouth   Norvasc [Amlodipine Besylate] Swelling   Tadalafil Other (See Comments)    headache   Toprol Xl [Metoprolol] Other (See Comments)    dizzy   Current Outpatient Medications on File Prior to Visit  Medication Sig Dispense Refill   aspirin 81 MG tablet Take 81 mg by mouth daily.      atorvastatin (LIPITOR) 40 MG tablet Take 1 tablet (40 mg total) by mouth daily. 90 tablet 3   benazepril (LOTENSIN) 40 MG tablet Take 1 tablet (40 mg total) by mouth daily. 90 tablet 3   Cholecalciferol 50 MCG (2000 UT) TABS 1 tab by mouth once daily 30 tablet 99   dorzolamide-timolol (COSOPT) 22.3-6.8 MG/ML ophthalmic solution 1 drop 2 (two) times daily.     hydrALAZINE (APRESOLINE) 50 MG tablet Take 1 tablet (50 mg total) by mouth 3 (three) times daily. 90 tablet 11   hydrochlorothiazide (MICROZIDE) 12.5 MG capsule Take 1 capsule (12.5 mg total) by mouth daily. 90 capsule 3   latanoprost (XALATAN) 0.005 % ophthalmic solution Place 1 drop into the right eye daily.  1   MYRBETRIQ 50 MG TB24 tablet Take 50 mg by mouth daily.     polyethylene glycol (MIRALAX /  GLYCOLAX) packet Take 17 g by mouth as needed.     potassium chloride (KLOR-CON 10) 10 MEQ tablet Take 1 tablet (10 mEq total) by mouth daily. 90 tablet 3   sildenafil (REVATIO) 20 MG tablet TAKE THREE TABLETS BY MOUTH AS NEEDED 90 tablet 11   SYSTANE ULTRA 0.4-0.3 % SOLN SMARTSIG:1 Drop(s) In Eye(s) PRN     tamsulosin (FLOMAX) 0.4 MG CAPS capsule Take 1 capsule (0.4 mg total) by mouth daily. 90 capsule 3   No current facility-administered medications on file prior to visit.        ROS:  All others reviewed and negative.  Objective        PE:  BP (!) 152/88 (BP Location: Right Arm, Patient Position: Sitting, Cuff Size: Large)   Pulse (!) 59   Temp 97.7 F (36.5 C) (Oral)   Ht '5\' 7"'$  (1.702 m)   Wt 197 lb (89.4 kg)   SpO2 97%   BMI 30.85 kg/m                  Constitutional: Pt appears in NAD               HENT: Head: NCAT.                Right Ear: External ear normal.                 Left Ear: External ear normal.                Eyes: . Pupils are equal, round, and reactive to light. Conjunctivae and EOM are normal               Nose: without d/c or deformity               Neck: Neck supple. Gross normal ROM               Cardiovascular: Normal rate and regular rhythm.                 Pulmonary/Chest: Effort normal and breath sounds without rales or wheezing.                Abd:  Soft, NT, ND, + BS, no organomegaly               Neurological: Pt is alert. At baseline orientation, motor grossly intact               Skin: Skin is warm. No rashes, no other new lesions, LE edema - none               Psychiatric: Pt behavior is normal without agitation   Micro: none  Cardiac tracings I have personally interpreted today:  none  Pertinent Radiological findings (summarize): none   Lab Results  Component Value Date   WBC 7.1 07/20/2021   HGB 15.4 07/20/2021   HCT 45.6 07/20/2021   PLT 239.0 07/20/2021   GLUCOSE 115 (H) 07/20/2021   CHOL 181 07/20/2021   TRIG 85.0 07/20/2021   HDL 62.90 07/20/2021   LDLDIRECT 139.2 10/29/2012   LDLCALC 101 (H) 07/20/2021   ALT 20 07/20/2021   AST 22 07/20/2021   NA 139 07/20/2021   K 3.4 (L) 07/20/2021   CL 99 07/20/2021   CREATININE 1.19 07/20/2021   BUN 15 07/20/2021   CO2 31 07/20/2021   TSH 1.45 07/20/2021   PSA 0.22 07/20/2021   INR 1.0 06/24/2007   HGBA1C 6.3  07/20/2021   Assessment/Plan:  [name] [name] is a 71 y.o. Black or African American [2] male with  has a past medical history of BENIGN PROSTATIC HYPERTROPHY (05/06/2010), COMMON MIGRAINE (07/31/2007), GERD (07/31/2007), HYPERLIPIDEMIA (01/04/2007), HYPERTENSION (01/04/2007), OVERACTIVE BLADDER (03/06/2007), and Prostate cancer ([place]) (02/04/15).  Vitamin D deficiency Last vitamin D Lab Results  Component Value Date   VD25OH 32.08  07/20/2021   Low, to start oral replacement   Labile hypertension Uncontrolled in the AM, for change of benazepril 40 mg to the PM, cont all other med tx as is - hydralazine 50 tid, hct 12.5 qd, and cont to monitor  BP Readings from Last 3 Encounters:  10/17/21 (!) 152/88  07/20/21 (!) 170/80  04/19/21 (!) 142/98      Hyperglycemia Lab Results  Component Value Date   HGBA1C 6.3 07/20/2021   Stable, pt to continue current medical treatment  - diet, wt control, excercise    Hypercholesterolemia Lab Results  Component Value Date   LDLCALC 101 (H) 07/20/2021   Uncontrolled, goal ldl < 70, now increased statin, for f/u lab today pt to continue current statin lipitor 40  Followup: Return in about 6 months (around 04/18/2022).  [name], MD 10/17/2021 9:13 AM [facility]

## 2021-10-28 ENCOUNTER — Ambulatory Visit
Admission: RE | Admit: 2021-10-28 | Discharge: 2021-10-28 | Disposition: A | Discharge status: Home / Self Care | Payer: Medicare Other | Source: Ambulatory Visit | Attending: Internal Medicine | Admitting: Internal Medicine

## 2021-10-28 DIAGNOSIS — R3129 Other microscopic hematuria: Secondary | ICD-10-CM

## 2021-10-28 DIAGNOSIS — K8020 Calculus of gallbladder without cholecystitis without obstruction: Secondary | ICD-10-CM | POA: Diagnosis not present

## 2021-10-28 DIAGNOSIS — K5730 Diverticulosis of large intestine without perforation or abscess without bleeding: Secondary | ICD-10-CM | POA: Diagnosis not present

## 2021-10-28 DIAGNOSIS — N281 Cyst of kidney, acquired: Secondary | ICD-10-CM | POA: Diagnosis not present

## 2021-11-09 ENCOUNTER — Other Ambulatory Visit: Payer: Self-pay | Admitting: Internal Medicine

## 2021-11-09 NOTE — Telephone Encounter (Signed)
Please refill as per office routine med refill policy (all routine meds to be refilled for 3 mo or monthly (per pt preference) up to one year from last visit, then month to month grace period for 3 mo, then further med refills will have to be denied) ? ?

## 2021-12-08 ENCOUNTER — Ambulatory Visit (INDEPENDENT_AMBULATORY_CARE_PROVIDER_SITE_OTHER): Payer: Medicare Other | Admitting: Internal Medicine

## 2021-12-08 ENCOUNTER — Encounter: Payer: Self-pay | Admitting: Internal Medicine

## 2021-12-08 ENCOUNTER — Other Ambulatory Visit: Payer: Self-pay | Admitting: Internal Medicine

## 2021-12-08 VITALS — BP 160/70 | HR 70 | Temp 98.0°F | Ht 67.0 in | Wt 196.0 lb

## 2021-12-08 DIAGNOSIS — E559 Vitamin D deficiency, unspecified: Secondary | ICD-10-CM | POA: Diagnosis not present

## 2021-12-08 DIAGNOSIS — K922 Gastrointestinal hemorrhage, unspecified: Secondary | ICD-10-CM

## 2021-12-08 DIAGNOSIS — K219 Gastro-esophageal reflux disease without esophagitis: Secondary | ICD-10-CM

## 2021-12-08 DIAGNOSIS — R0989 Other specified symptoms and signs involving the circulatory and respiratory systems: Secondary | ICD-10-CM

## 2021-12-08 LAB — BASIC METABOLIC PANEL
BUN: 14 mg/dL (ref 6–23)
CO2: 29 mEq/L (ref 19–32)
Calcium: 9.3 mg/dL (ref 8.4–10.5)
Chloride: 108 mEq/L (ref 96–112)
Creatinine, Ser: 1.14 mg/dL (ref 0.40–1.50)
GFR: 63.77 mL/min (ref >60.00)
Glucose, Bld: 111 mg/dL — ABNORMAL HIGH (ref 70–99)
Potassium: 4 mEq/L (ref 3.5–5.1)
Sodium: 142 mEq/L (ref 135–145)

## 2021-12-08 LAB — PROTIME-INR
INR: 1 ratio (ref 0.8–1.0)
Prothrombin Time: 11.3 s (ref 9.6–13.1)

## 2021-12-08 LAB — CBC WITH DIFFERENTIAL/PLATELET
Basophils Absolute: 0 10*3/uL (ref 0.0–0.1)
Basophils Relative: 0.3 % (ref 0.0–3.0)
Eosinophils Absolute: 0 10*3/uL (ref 0.0–0.7)
Eosinophils Relative: 0.3 % (ref 0.0–5.0)
HCT: 35.8 % — ABNORMAL LOW (ref 39.0–52.0)
Hemoglobin: 11.9 g/dL — ABNORMAL LOW (ref 13.0–17.0)
Lymphocytes Relative: 19.2 % (ref 12.0–46.0)
Lymphs Abs: 1.7 10*3/uL (ref 0.7–4.0)
MCHC: 33.3 g/dL (ref 30.0–36.0)
MCV: 94.5 fl (ref 78.0–100.0)
Monocytes Absolute: 0.8 10*3/uL (ref 0.1–1.0)
Monocytes Relative: 9 % (ref 3.0–12.0)
Neutro Abs: 6.3 10*3/uL (ref 1.4–7.7)
Neutrophils Relative %: 71.2 % (ref 43.0–77.0)
Platelets: 256 10*3/uL (ref 150.0–400.0)
RBC: 3.79 Mil/uL — ABNORMAL LOW (ref 4.22–5.81)
RDW: 14 % (ref 11.5–15.5)
WBC: 8.8 10*3/uL (ref 4.0–10.5)

## 2021-12-08 LAB — HEPATIC FUNCTION PANEL
ALT: 14 U/L (ref 0–53)
AST: 16 U/L (ref 0–37)
Albumin: 4.2 g/dL (ref 3.5–5.2)
Alkaline Phosphatase: 49 U/L (ref 39–117)
Bilirubin, Direct: 0.2 mg/dL (ref 0.0–0.3)
Total Bilirubin: 0.6 mg/dL (ref 0.2–1.2)
Total Protein: 7.1 g/dL (ref 6.0–8.3)

## 2021-12-08 MED ORDER — PANTOPRAZOLE SODIUM 40 MG PO TBEC: 40.0000 mg | DELAYED_RELEASE_TABLET | Freq: Every day | ORAL | 3 refills | Status: DC

## 2021-12-08 NOTE — Progress Notes (Signed)
Patient ID: [name] [name], male   DOB: [birth year], 71 y.o.   MRN: [id number]        Chief Complaint: follow up BRBPR       HPI:  [name] [name] is a 71 y.o. male here with c/o BRBPR - had hx of small volume hemtochezia wtihout pain or fever 2-3 times last wk, then 1 larger volume stool on last Sunday (4 days ago), then small volume twice since then, none for 2 days.   Pt denies chest pain, increased sob or doe, wheezing, orthopnea, PND, increased LE swelling, palpitations, dizziness or syncope, though BP has been more labile and at one point SBP < 90 so has been taking only Half BP meds with then increased BP med such as today 160/70. No prior hx of GI bleeding.  Did have prior colonoscopy with Dr Henrene Pastor 2019 with diverticulosis.   Pt denies polydipsia, polyuria, or new focal neuro s/s.    Pt denies fever, wt loss, night sweats, loss of appetite, or other constitutional symptoms         Wt Readings from Last 3 Encounters:  12/10/21 195 lb 15.8 oz (88.9 kg)  12/08/21 196 lb (88.9 kg)  10/17/21 197 lb (89.4 kg)   BP Readings from Last 3 Encounters:  12/11/21 (!) 164/85  12/08/21 (!) 160/70  10/17/21 (!) 152/88         Past Medical History:  Diagnosis Date   BENIGN PROSTATIC HYPERTROPHY 05/06/2010   COMMON MIGRAINE 07/31/2007   GERD 07/31/2007   HYPERLIPIDEMIA 01/04/2007   HYPERTENSION 01/04/2007   OVERACTIVE BLADDER 03/06/2007   Prostate cancer ([place]) 02/04/2015   Past Surgical History:  Procedure Laterality Date   COLONOSCOPY  2009    reports that he has never smoked. He has never used smokeless tobacco. He reports that he does not drink alcohol and does not use drugs. family history includes Heart disease (age of onset: 61) in his brother; Lung cancer (age of onset: 15) in his father. Allergies  Allergen Reactions   Beta Adrenergic Blockers Other (See Comments)    bradycardia   Celebrex [Celecoxib] Other (See Comments)    Stroke like symptoms   Crestor [Rosuvastatin] Other (See  Comments)    weakness   Detrol [Tolterodine] Other (See Comments)    headache   Ditropan [Oxybutynin] Other (See Comments)    Dry mouth   Hydralazine Other (See Comments)    Pt seems abnormally sensitive to med.  Got just '50mg'$  PO, dropped SBP from 180 to 73 with paradoxical sinus bradycardia in low 40s. If going to try to use in future, consider dramatically reduced dose.   Norvasc [Amlodipine Besylate] Swelling   Tadalafil Other (See Comments)    headache   Toprol Xl [Metoprolol] Other (See Comments)    dizzy   No current facility-administered medications on file prior to visit.   Current Outpatient Medications on File Prior to Visit  Medication Sig Dispense Refill   aspirin 81 MG tablet Take 81 mg by mouth daily.      atorvastatin (LIPITOR) 40 MG tablet Take 1 tablet (40 mg total) by mouth daily. 90 tablet 3   benazepril (LOTENSIN) 40 MG tablet Take 1 tablet (40 mg total) by mouth daily. (Patient not taking: Reported on 12/10/2021) 90 tablet 3   Cholecalciferol 50 MCG (2000 UT) TABS 1 tab by mouth once daily (Patient taking differently: Take 2,000 Units by mouth daily.) 30 tablet 99   dorzolamide-timolol (COSOPT) 22.3-6.8 MG/ML ophthalmic solution  Place 1 drop into both eyes 2 (two) times daily.     GEMTESA 75 MG TABS Take 75 mg by mouth daily. SAMPLES     hydrochlorothiazide (MICROZIDE) 12.5 MG capsule Take 1 capsule (12.5 mg total) by mouth daily. (Patient not taking: Reported on 12/10/2021) 90 capsule 3   latanoprost (XALATAN) 0.005 % ophthalmic solution Place 1 drop into both eyes at bedtime.  1   polyethylene glycol (MIRALAX / GLYCOLAX) packet Take 17 g by mouth as needed for mild constipation.     potassium chloride (KLOR-CON 10) 10 MEQ tablet Take 1 tablet (10 mEq total) by mouth daily. 90 tablet 3   sildenafil (REVATIO) 20 MG tablet TAKE THREE TABLETS BY MOUTH AS NEEDED (Patient taking differently: Take 60 mg by mouth as needed (erectile dysfunction).) 90 tablet 11   SYSTANE ULTRA  0.4-0.3 % SOLN Place 1 drop into both eyes daily as needed (dryness).     tamsulosin (FLOMAX) 0.4 MG CAPS capsule TAKE 1 CAPSULE BY MOUTH EVERY DAY (Patient taking differently: Take 0.4 mg by mouth daily.) 90 capsule 1   hydrALAZINE (APRESOLINE) 50 MG tablet Take 1 tablet (50 mg total) by mouth 3 (three) times daily. (Patient not taking: Reported on 12/08/2021) 90 tablet 11        ROS:  All others reviewed and negative.  Objective        PE:  BP (!) 160/70 (BP Location: Right Arm, Patient Position: Sitting, Cuff Size: Large)   Pulse 70   Temp 98 F (36.7 C) (Oral)   Ht '5\' 7"'$  (1.702 m)   Wt 196 lb (88.9 kg)   SpO2 98%   BMI 30.70 kg/m                 Constitutional: Pt appears in NAD               HENT: Head: NCAT.                Right Ear: External ear normal.                 Left Ear: External ear normal.                Eyes: . Pupils are equal, round, and reactive to light. Conjunctivae and EOM are normal               Nose: without d/c or deformity               Neck: Neck supple. Gross normal ROM               Cardiovascular: Normal rate and regular rhythm.                 Pulmonary/Chest: Effort normal and breath sounds without rales or wheezing.                Abd:  Soft, NT, ND, + BS, no organomegaly               Neurological: Pt is alert. At baseline orientation, motor grossly intact               Skin: Skin is warm. No rashes, no other new lesions, LE edema -none               Psychiatric: Pt behavior is normal without agitation   Micro: none  Cardiac tracings I have personally interpreted today:  none  Pertinent Radiological findings (summarize): none  Lab Results  Component Value Date   WBC 12.2 (H) 12/11/2021   HGB 11.5 (L) 12/11/2021   HCT 33.2 (L) 12/11/2021   PLT 260 12/11/2021   GLUCOSE 118 (H) 12/11/2021   CHOL 151 10/17/2021   TRIG 66.0 10/17/2021   HDL 57.40 10/17/2021   LDLDIRECT 139.2 10/29/2012   LDLCALC 80 10/17/2021   ALT 18 12/10/2021   AST  21 12/10/2021   NA 135 12/11/2021   K 3.7 12/11/2021   CL 102 12/11/2021   CREATININE 1.21 12/11/2021   BUN 11 12/11/2021   CO2 26 12/11/2021   TSH 1.45 07/20/2021   PSA 0.22 07/20/2021   INR 1.0 12/08/2021   HGBA1C 6.0 10/17/2021   Assessment/Plan:  KELEN LAURA is a 71 y.o. Black or African American [2] male with  has a past medical history of BENIGN PROSTATIC HYPERTROPHY (05/06/2010), COMMON MIGRAINE (07/31/2007), GERD (07/31/2007), HYPERLIPIDEMIA (01/04/2007), HYPERTENSION (01/04/2007), OVERACTIVE BLADDER (03/06/2007), and Prostate cancer ([place]) (02/04/2015).  Labile hypertension Agree with half BP med for now, and follow due to labile recent  Lower GI bleeding Small volume recurrent painless without fever except for one larger volume 4 days ago, none in last 2 days, does not want to go to ED today, now for d/c asa, start protonix 40 qd, check cbc, and refer Dr Henrene Pastor GI asap  GERD Also for Protonix 40 mg qd   Vitamin D deficiency Last vitamin D Lab Results  Component Value Date   VD25OH 45.37 10/17/2021   Low, reminded to start oral replacement'  Followup: No follow-ups on file.  [name], MD 12/11/2021 9:09 AM [facility]

## 2021-12-08 NOTE — Patient Instructions (Addendum)
Please hold on taking your BP medications for at least the next 3-5 days  Please restart the BP medications one at a time after that if the BP stays over 150-160 on the top number  If there is no further bleeding and you system calms down, you will most likely be able to restart your medications one at a time in about 1 week  Ok to STOP the Aspirin for now  Please take all new medication as prescribed - the Protonix  Please continue all other medications as before  Please have the pharmacy call with any other refills you may need.  Please keep your appointments with your specialists as you may have planned  You will be contacted regarding the referral for: Dr Henrene Pastor - GI  Please go to the LAB at the blood drawing area for the tests to be done  You will be contacted by phone if any changes need to be made immediately.  Otherwise, you will receive a letter about your results with an explanation, but please check with MyChart first.  Please remember to sign up for MyChart if you have not done so, as this will be important to you in the future with finding out test results, communicating by private email, and scheduling acute appointments online when needed.  Please return in 2 weeks, or sooner if needed

## 2021-12-09 ENCOUNTER — Telehealth: Payer: Self-pay | Admitting: Internal Medicine

## 2021-12-09 NOTE — Telephone Encounter (Signed)
Please see note below from Dr. Silverio Decamp, pt will need to be seen in ER if having active bleeding. Pt scheduled to see Vicie Mutters PA 12/20/21'@1'$ :30pm. Please let pt know.

## 2021-12-09 NOTE — Telephone Encounter (Signed)
Patient will need to come to ER if she is having large volume rectal bleeding for evaluation and appropriate management.

## 2021-12-09 NOTE — Telephone Encounter (Signed)
Dr. Silverio Decamp please advise regarding urgency of this referral. There are no app appts until 8/15, Dr. Henrene Pastor has no appts until possibly the 10th. Please advise as DOD.

## 2021-12-10 ENCOUNTER — Encounter (HOSPITAL_COMMUNITY): Payer: Self-pay | Admitting: *Deleted

## 2021-12-10 ENCOUNTER — Emergency Department (HOSPITAL_COMMUNITY): Payer: Medicare Other

## 2021-12-10 ENCOUNTER — Other Ambulatory Visit: Payer: Self-pay

## 2021-12-10 ENCOUNTER — Observation Stay (HOSPITAL_COMMUNITY)
Admission: EM | Admit: 2021-12-10 | Discharge: 2021-12-11 | Disposition: A | Discharge status: Home / Self Care | Payer: Medicare Other | Attending: Family Medicine | Admitting: Family Medicine

## 2021-12-10 DIAGNOSIS — R350 Frequency of micturition: Secondary | ICD-10-CM | POA: Insufficient documentation

## 2021-12-10 DIAGNOSIS — I959 Hypotension, unspecified: Secondary | ICD-10-CM | POA: Diagnosis not present

## 2021-12-10 DIAGNOSIS — D62 Acute posthemorrhagic anemia: Secondary | ICD-10-CM | POA: Insufficient documentation

## 2021-12-10 DIAGNOSIS — K921 Melena: Secondary | ICD-10-CM | POA: Insufficient documentation

## 2021-12-10 DIAGNOSIS — K625 Hemorrhage of anus and rectum: Secondary | ICD-10-CM | POA: Diagnosis not present

## 2021-12-10 DIAGNOSIS — E7800 Pure hypercholesterolemia, unspecified: Secondary | ICD-10-CM | POA: Insufficient documentation

## 2021-12-10 DIAGNOSIS — Z8546 Personal history of malignant neoplasm of prostate: Secondary | ICD-10-CM | POA: Diagnosis not present

## 2021-12-10 DIAGNOSIS — I10 Essential (primary) hypertension: Secondary | ICD-10-CM | POA: Insufficient documentation

## 2021-12-10 DIAGNOSIS — R001 Bradycardia, unspecified: Secondary | ICD-10-CM | POA: Diagnosis not present

## 2021-12-10 DIAGNOSIS — N400 Enlarged prostate without lower urinary tract symptoms: Secondary | ICD-10-CM | POA: Diagnosis not present

## 2021-12-10 DIAGNOSIS — K922 Gastrointestinal hemorrhage, unspecified: Secondary | ICD-10-CM

## 2021-12-10 DIAGNOSIS — K3189 Other diseases of stomach and duodenum: Secondary | ICD-10-CM | POA: Diagnosis not present

## 2021-12-10 DIAGNOSIS — Z7982 Long term (current) use of aspirin: Secondary | ICD-10-CM | POA: Insufficient documentation

## 2021-12-10 DIAGNOSIS — Z79899 Other long term (current) drug therapy: Secondary | ICD-10-CM | POA: Insufficient documentation

## 2021-12-10 DIAGNOSIS — K2971 Gastritis, unspecified, with bleeding: Secondary | ICD-10-CM | POA: Diagnosis not present

## 2021-12-10 DIAGNOSIS — K219 Gastro-esophageal reflux disease without esophagitis: Secondary | ICD-10-CM | POA: Diagnosis not present

## 2021-12-10 DIAGNOSIS — D649 Anemia, unspecified: Secondary | ICD-10-CM | POA: Diagnosis not present

## 2021-12-10 DIAGNOSIS — I952 Hypotension due to drugs: Secondary | ICD-10-CM

## 2021-12-10 DIAGNOSIS — B9681 Helicobacter pylori [H. pylori] as the cause of diseases classified elsewhere: Secondary | ICD-10-CM | POA: Insufficient documentation

## 2021-12-10 DIAGNOSIS — K5791 Diverticulosis of intestine, part unspecified, without perforation or abscess with bleeding: Secondary | ICD-10-CM | POA: Diagnosis not present

## 2021-12-10 DIAGNOSIS — K8020 Calculus of gallbladder without cholecystitis without obstruction: Secondary | ICD-10-CM | POA: Diagnosis not present

## 2021-12-10 LAB — COMPREHENSIVE METABOLIC PANEL
ALT: 18 U/L (ref 0–44)
AST: 21 U/L (ref 15–41)
Albumin: 4 g/dL (ref 3.5–5.0)
Alkaline Phosphatase: 52 U/L (ref 38–126)
Anion gap: 6 (ref 5–15)
BUN: 10 mg/dL (ref 8–23)
CO2: 28 mmol/L (ref 22–32)
Calcium: 9.2 mg/dL (ref 8.9–10.3)
Chloride: 105 mmol/L (ref 98–111)
Creatinine, Ser: 1.19 mg/dL (ref 0.61–1.24)
GFR, Estimated: >60 mL/min (ref >60)
Glucose, Bld: 119 mg/dL — ABNORMAL HIGH (ref 70–99)
Potassium: 4 mmol/L (ref 3.5–5.1)
Sodium: 139 mmol/L (ref 135–145)
Total Bilirubin: 0.9 mg/dL (ref 0.3–1.2)
Total Protein: 7.3 g/dL (ref 6.5–8.1)

## 2021-12-10 LAB — CBC
HCT: 36.6 % — ABNORMAL LOW (ref 39.0–52.0)
HCT: 36.4 % — ABNORMAL LOW (ref 39.0–52.0)
Hemoglobin: 12.3 g/dL — ABNORMAL LOW (ref 13.0–17.0)
Hemoglobin: 12.5 g/dL — ABNORMAL LOW (ref 13.0–17.0)
MCH: 31.5 pg (ref 26.0–34.0)
MCH: 32 pg (ref 26.0–34.0)
MCHC: 33.6 g/dL (ref 30.0–36.0)
MCHC: 34.3 g/dL (ref 30.0–36.0)
MCV: 93.8 fL (ref 80.0–100.0)
MCV: 93.1 fL (ref 80.0–100.0)
Platelets: 295 10*3/uL (ref 150–400)
Platelets: 286 10*3/uL (ref 150–400)
RBC: 3.9 MIL/uL — ABNORMAL LOW (ref 4.22–5.81)
RBC: 3.91 MIL/uL — ABNORMAL LOW (ref 4.22–5.81)
RDW: 13.5 % (ref 11.5–15.5)
RDW: 13.6 % (ref 11.5–15.5)
WBC: 8 10*3/uL (ref 4.0–10.5)
WBC: 9.2 10*3/uL (ref 4.0–10.5)
nRBC: 0 % (ref 0.0–0.2)
nRBC: 0 % (ref 0.0–0.2)

## 2021-12-10 LAB — TYPE AND SCREEN
ABO/RH(D): A POS
Antibody Screen: NEGATIVE

## 2021-12-10 LAB — HEMOGLOBIN AND HEMATOCRIT, BLOOD
HCT: 32.6 % — ABNORMAL LOW (ref 39.0–52.0)
Hemoglobin: 11 g/dL — ABNORMAL LOW (ref 13.0–17.0)

## 2021-12-10 LAB — POC OCCULT BLOOD, ED: Fecal Occult Bld: POSITIVE — AB

## 2021-12-10 MED ORDER — SODIUM CHLORIDE 0.45 % IV SOLN: INTRAVENOUS | Status: DC

## 2021-12-10 MED ORDER — HYDROCHLOROTHIAZIDE 12.5 MG PO TABS
12.5000 mg | ORAL_TABLET | Freq: Every day | ORAL | Status: DC
  Administered 2021-12-10: 12.5 mg via ORAL
  Filled 2021-12-10: qty 1

## 2021-12-10 MED ORDER — HYDRALAZINE HCL 20 MG/ML IJ SOLN: 10.0000 mg | Freq: Four times a day (QID) | INTRAMUSCULAR | Status: DC | PRN

## 2021-12-10 MED ORDER — HYDRALAZINE HCL 50 MG PO TABS
50.0000 mg | ORAL_TABLET | Freq: Three times a day (TID) | ORAL | Status: DC
  Administered 2021-12-10: 50 mg via ORAL
  Filled 2021-12-10: qty 2

## 2021-12-10 MED ORDER — ATORVASTATIN CALCIUM 40 MG PO TABS
40.0000 mg | ORAL_TABLET | Freq: Every day | ORAL | Status: DC
  Administered 2021-12-10: 40 mg via ORAL
  Filled 2021-12-10: qty 1

## 2021-12-10 MED ORDER — PANTOPRAZOLE SODIUM 40 MG IV SOLR
40.0000 mg | Freq: Two times a day (BID) | INTRAVENOUS | Status: DC
  Administered 2021-12-10: 40 mg via INTRAVENOUS
  Filled 2021-12-10: qty 10

## 2021-12-10 MED ORDER — BENAZEPRIL HCL 5 MG PO TABS
40.0000 mg | ORAL_TABLET | Freq: Every day | ORAL | Status: DC
  Filled 2021-12-10: qty 1

## 2021-12-10 MED ORDER — ACETAMINOPHEN 325 MG PO TABS: 650.0000 mg | ORAL_TABLET | Freq: Four times a day (QID) | ORAL | Status: DC | PRN

## 2021-12-10 MED ORDER — HYDROCODONE-ACETAMINOPHEN 5-325 MG PO TABS: 1.0000 | ORAL_TABLET | ORAL | Status: DC | PRN

## 2021-12-10 MED ORDER — PANTOPRAZOLE SODIUM 40 MG PO TBEC
40.0000 mg | DELAYED_RELEASE_TABLET | Freq: Every day | ORAL | Status: DC
  Administered 2021-12-10: 40 mg via ORAL
  Filled 2021-12-10: qty 1

## 2021-12-10 MED ORDER — ONDANSETRON HCL 4 MG PO TABS: 4.0000 mg | ORAL_TABLET | Freq: Four times a day (QID) | ORAL | Status: DC | PRN

## 2021-12-10 MED ORDER — ONDANSETRON HCL 4 MG/2ML IJ SOLN: 4.0000 mg | Freq: Four times a day (QID) | INTRAMUSCULAR | Status: DC | PRN

## 2021-12-10 MED ORDER — MIRABEGRON ER 25 MG PO TB24
25.0000 mg | ORAL_TABLET | Freq: Every day | ORAL | Status: DC
  Filled 2021-12-10: qty 1

## 2021-12-10 MED ORDER — LACTATED RINGERS IV BOLUS: 1000.0000 mL | Freq: Once | INTRAVENOUS | Status: DC

## 2021-12-10 MED ORDER — TAMSULOSIN HCL 0.4 MG PO CAPS
0.4000 mg | ORAL_CAPSULE | Freq: Every day | ORAL | Status: DC
Start: 2021-12-10 — End: 2021-12-11
  Administered 2021-12-10: 0.4 mg via ORAL
  Filled 2021-12-10: qty 1

## 2021-12-10 MED ORDER — IOHEXOL 350 MG/ML SOLN
75.0000 mL | Freq: Once | INTRAVENOUS | Status: AC | PRN
  Administered 2021-12-10: 75 mL via INTRAVENOUS

## 2021-12-10 MED ORDER — SODIUM CHLORIDE 0.9 % IV BOLUS
500.0000 mL | Freq: Once | INTRAVENOUS | Status: AC
  Administered 2021-12-10: 500 mL via INTRAVENOUS

## 2021-12-10 MED ORDER — ACETAMINOPHEN 650 MG RE SUPP: 650.0000 mg | Freq: Four times a day (QID) | RECTAL | Status: DC | PRN

## 2021-12-10 NOTE — Consult Note (Signed)
NAME:  [name] [name], MRN:  [id number], DOB:  June 04, 1950, LOS: 0 ADMISSION DATE:  12/10/2021, CONSULTATION DATE:  8/5 REFERRING MD:  Lelon Mast FOR CONSULT:  Hypotension   History of Present Illness:  [name] [name] is a 71 y.o. M who presented to [facility] on 8/5 with melena.  He has a past medical history of HTN, HLD, GERD, BPH  He was admitted to the [facility] service for GI bleed. GI was consulted in the ED with plans for scope on 8/6. The patient developed hypertension in the ED for which  hydralazine and hctz were given  The evening of 8/5 he became hypotensive with a BP 73/40, HR 39-40. This resolved with 600 IVF (HR 63 and BP 152/68).   PCCM consulted for hypotension  Pertinent  Medical History  HTN, HLD, GERD, BPH  Significant Hospital Events: Including procedures, antibiotic start and stop dates in addition to other pertinent events   8/5 admit to [facility], PCCM consult  Interim History / Subjective:  See above  Subjective: denies chest pain, denies sob, denies LOC, endorses continued melena  Objective   Blood pressure (!) 152/68, pulse 63, temperature 97.8 F (36.6 C), temperature source Oral, resp. rate 17, height '5\' 7"'$  (1.702 m), weight 88.9 kg, SpO2 100 %.       No intake or output data in the 24 hours ending 12/10/21 2019 Filed Weights   12/10/21 1021  Weight: 88.9 kg    Examination: General: In bed, NAD, appears comfortable HEENT: MM pink/moist, anicteric, atraumatic Neuro: RASS 0, PERRL 65m, GCS 15 CV: S1S2, NSR, no m/r/g appreciated PULM:  clear in the upper lobes, clear in the lower lobes, trachea midline, chest expansion symmetric GI: soft, bsx4 active, non-tender   Extremities: warm/dry, no pretibial edema, capillary refill less than 3 seconds  Skin:  no rashes or lesions noted  Labs/imaging CMP reviewed Hemoglobin 11.9 > 12.3>12.5 Fecal occult blood positive Twelve-lead: Some artifact, sinus bradycardia, no significant ST changes noted CT angio abdomen  pelvis: Per radiology no evidence of active GI bleed.  Enhancing area of gastric antrum consider correlation with endoscopy.  Diverticulosis without evidence of acute diverticulitis.   Resolved Hospital Problem list     Assessment & Plan:  Hypotension Sinus bradycardia HX HTN Tmax 98.3, WBC 9.2. BP 73/40, HR 39-40. Resolved with 600 IVF. Now HR 63 and BP 152/68. HTN in ED> Hydralazine and hctz given in ED. Had recently been told by PCP to hold home antihypertenives. Suspect medication related vs vagal episode?  Hemoglobin remained stable.  No evidence of GI bleed on CT scan per radiology. -No need for transfer to ICU at this time. Agree with transfer to progressive with continuous telemetry. [facility]  -Recommend no further antihypertensives overnight unless symptomatic or evidence of organ dysfunction -continue trending hgb -Continue IVF at 75 -Establish 2nd PIV  GI bleed Diverticulosis without evidence of acute diverticulitis GERD Acute blood loss anemia secondary to GIB -GI consulted -Trend hemoglobin -Continue Protonix -Transfuse PRBC if HBG less than 7  All other issues per primary HLD BPH  PCCM will sign off.  Please feel free to reconsult [place] issues arise.  Best Practice (right click and "Reselect all SmartList Selections" daily)   Per primary  Labs   CBC: Recent Labs  Lab 12/08/21 1625 12/10/21 1030 12/10/21 1432  WBC 8.8 8.0 9.2  NEUTROABS 6.3  --   --   HGB 11.9* 12.3* 12.5*  HCT 35.8*  36.6* 36.4*  MCV 94.5 93.8 93.1  PLT 256.0 295 426    Basic Metabolic Panel: Recent Labs  Lab 12/08/21 1625 12/10/21 1030  NA 142 139  K 4.0 4.0  CL 108 105  CO2 29 28  GLUCOSE 111* 119*  BUN 14 10  CREATININE 1.14 1.19  CALCIUM 9.3 9.2   GFR: Estimated Creatinine Clearance: 58.8 mL/min (by C-G formula based on SCr of 1.19 mg/dL). Recent Labs  Lab 12/08/21 1625 12/10/21 1030 12/10/21 1432  WBC 8.8 8.0 9.2    Liver Function  Tests: Recent Labs  Lab 12/08/21 1625 12/10/21 1030  AST 16 21  ALT 14 18  ALKPHOS 49 52  BILITOT 0.6 0.9  PROT 7.1 7.3  ALBUMIN 4.2 4.0   No results for input(s): "LIPASE", "AMYLASE" in the last 168 hours. No results for input(s): "AMMONIA" in the last 168 hours.  ABG    Component Value Date/Time   HCO3 26.1 (H) 06/24/2007 1314   TCO2 27 06/24/2007 1314     Coagulation Profile: Recent Labs  Lab 12/08/21 1625  INR 1.0    Cardiac Enzymes: No results for input(s): "CKTOTAL", "CKMB", "CKMBINDEX", "TROPONINI" in the last 168 hours.  HbA1C: Hgb A1c MFr Bld  Date/Time Value Ref Range Status  10/17/2021 09:21 AM 6.0 4.6 - 6.5 % Final    Comment:    Glycemic Control Guidelines for People with Diabetes:Non Diabetic:  <6%Goal of Therapy: <7%Additional Action Suggested:  >8%   07/20/2021 09:26 AM 6.3 4.6 - 6.5 % Final    Comment:    Glycemic Control Guidelines for People with Diabetes:Non Diabetic:  <6%Goal of Therapy: <7%Additional Action Suggested:  >8%     CBG: No results for input(s): "GLUCAP" in the last 168 hours.  Review of Systems:   Positives in bold  Gen: fever, chills, weight change, fatigue, night sweats HEENT:  blurred vision, double vision, hearing loss, tinnitus, sinus congestion, rhinorrhea, sore throat, neck stiffness, dysphagia PULM:  shortness of breath, cough, sputum production, hemoptysis, wheezing CV: chest pain, edema, orthopnea, paroxysmal nocturnal dyspnea, palpitations GI:  abdominal pain, nausea, vomiting, diarrhea, hematochezia, melena, constipation, change in bowel habits GU: dysuria, hematuria, polyuria, oliguria, urethral discharge Endocrine: hot or cold intolerance, polyuria, polyphagia or appetite change Derm: rash, dry skin, scaling or peeling skin change Heme: easy bruising, bleeding, bleeding gums Neuro: headache, numbness, weakness, slurred speech, loss of memory or consciousness   Past Medical History:  He,  has a past medical  history of BENIGN PROSTATIC HYPERTROPHY (05/06/2010), COMMON MIGRAINE (07/31/2007), GERD (07/31/2007), HYPERLIPIDEMIA (01/04/2007), HYPERTENSION (01/04/2007), OVERACTIVE BLADDER (03/06/2007), and Prostate cancer ([place]) (02/04/15).   Surgical History:   Past Surgical History:  Procedure Laterality Date   COLONOSCOPY  2009     Social History:   reports that he has never smoked. He has never used smokeless tobacco. He reports that he does not drink alcohol and does not use drugs.   Family History:  His family history includes Heart disease (age of onset: 1) in his brother; Lung cancer (age of onset: 60) in his father. There is no history of Colon cancer, Esophageal cancer, Rectal cancer, or Stomach cancer.   Allergies Allergies  Allergen Reactions   Beta Adrenergic Blockers Other (See Comments)    bradycardia   Celebrex [Celecoxib] Other (See Comments)    Stroke like symptoms   Crestor [Rosuvastatin] Other (See Comments)    weakness   Detrol [Tolterodine] Other (See Comments)    headache   Ditropan [Oxybutynin] Other (  See Comments)    Dry mouth   Norvasc [Amlodipine Besylate] Swelling   Tadalafil Other (See Comments)    headache   Toprol Xl [Metoprolol] Other (See Comments)    dizzy     Home Medications  Prior to Admission medications   Medication Sig Start Date End Date Taking? Authorizing Provider  aspirin 81 MG tablet Take 81 mg by mouth daily.    Yes [provider]  aspirin-acetaminophen-caffeine (EXCEDRIN MIGRAINE) [phone] MG tablet Take 1 tablet by mouth every 6 (six) hours as needed for migraine.   Yes [provider]  atorvastatin (LIPITOR) 40 MG tablet Take 1 tablet (40 mg total) by mouth daily. 07/20/21  Yes Biagio Borg, MD  Cholecalciferol 50 MCG (2000 UT) TABS 1 tab by mouth once daily Patient taking differently: Take 2,000 Units by mouth daily. 07/20/21  Yes Biagio Borg, MD  dorzolamide-timolol (COSOPT) 22.3-6.8 MG/ML ophthalmic solution Place 1  drop into both eyes 2 (two) times daily. 09/24/20  Yes [provider]  GEMTESA 75 MG TABS Take 75 mg by mouth daily. SAMPLES 11/29/21  Yes [provider]  latanoprost (XALATAN) 0.005 % ophthalmic solution Place 1 drop into both eyes at bedtime. 11/09/17  Yes [provider]  pantoprazole (PROTONIX) 40 MG tablet Take 1 tablet (40 mg total) by mouth daily. 12/08/21  Yes Biagio Borg, MD  polyethylene glycol [facility] / Floria Raveling) packet Take 17 g by mouth as needed for mild constipation.   Yes [provider]  potassium chloride (KLOR-CON 10) 10 MEQ tablet Take 1 tablet (10 mEq total) by mouth daily. 07/20/21  Yes Biagio Borg, MD  sildenafil (REVATIO) 20 MG tablet TAKE THREE TABLETS BY MOUTH AS NEEDED Patient taking differently: Take 60 mg by mouth as needed (erectile dysfunction). 04/26/21  Yes Biagio Borg, MD  SYSTANE ULTRA 0.4-0.3 % SOLN Place 1 drop into both eyes daily as needed (dryness). 08/14/19  Yes [provider]  tamsulosin (FLOMAX) 0.4 MG CAPS capsule TAKE 1 CAPSULE BY MOUTH EVERY DAY Patient taking differently: Take 0.4 mg by mouth daily. 11/09/21  Yes Biagio Borg, MD  benazepril (LOTENSIN) 40 MG tablet Take 1 tablet (40 mg total) by mouth daily. Patient not taking: Reported on 12/10/2021 08/31/21   Biagio Borg, MD  hydrALAZINE (APRESOLINE) 50 MG tablet Take 1 tablet (50 mg total) by mouth 3 (three) times daily. Patient not taking: Reported on 12/08/2021 07/26/21   Biagio Borg, MD  hydrochlorothiazide (MICROZIDE) 12.5 MG capsule Take 1 capsule (12.5 mg total) by mouth daily. Patient not taking: Reported on 12/10/2021 08/31/21   Biagio Borg, MD     Critical care time: N/A    [employer] MSN, APRN, AGACNP-BC [facility]  12/10/2021 , 8:48 PM  Please see [email or web address] for pager details  If no response, please call [phone] After hours, please call Elink at [phone]

## 2021-12-10 NOTE — Progress Notes (Signed)
Called to pt room for pt reports of dizziness/lightheadedness. Pt states he was lying in bed when dizziness started. He denied any CP.  SBP 70s, DBP 40s and HR sustaining low 40s. Pt remains alert and awake but drowsy and  reports feeling generally unwell. Rapid Response called and MD notified.  NS bolus infusing, EKG showing SB.  Pt vital signs improving. MD and RRN still at bedside. Plans to transfer to step down. Spouse is at bedside.

## 2021-12-10 NOTE — Progress Notes (Signed)
TRH Night coverage note Pt seen and examined at bedside.  71 yo M with GIB.  GI planning EGD tomorrow, CT AP didn't show active bleed.  Pt got '50mg'$  PO hydralazine + 12.'5mg'$  HCTZ in ED before coming to floor for SBPs 180-200.  On floor had S.Brady to 40s and BP dropped to 73/40, confirmed by manual on both arms.  Pt says feeling generally unwell, states his BP does this at home when he takes BP meds which is why they've had him off BP meds the past few days.  Has been taking tamsulosin without ill effect in that time frame.  Started IVF bolus, only got 500cc of bolus when suddenly BP improved to 793 systolic and HR improved to 60-70 range.  A: Possibly over response to BP med?  P: 1) Transfer to SDU 2) H/H Q6H first now 3) tele monitor 4) inclined to let BPs run high in hospital if anything at the moment as long as asymptomatic. 5) DCd hydralazine and put a caution in allergies list.  Consider significantly reduced dose if attempting this med again.  CRITICAL CARE Performed by: Etta Quill.   Total critical care time: 60 minutes  Critical care time was exclusive of separately billable procedures and treating other patients.  Critical care was necessary to treat or prevent imminent or life-threatening deterioration.  Critical care was time spent personally by me on the following activities: development of treatment plan with patient and/or surrogate as well as nursing, discussions with consultants, evaluation of patient's response to treatment, examination of patient, obtaining history from patient or surrogate, ordering and performing treatments and interventions, ordering and review of laboratory studies, ordering and review of radiographic studies, pulse oximetry and re-evaluation of patient's condition.

## 2021-12-10 NOTE — Progress Notes (Signed)
RN called to room by pt wife. Pt requesting BP check , saying he felt bad again. VS'@21'$ :20: HR 44 BP 72/43. Pt c/o feeling weak, tired. He was noted to be diaphoretic and restless. Pt soon started shaking/ in upper body/neck and vomited small amount of thick clear/yellow mucus and sputum. After vomiting, pt HR increased to 114 briefly. Pt reported he felt better. BP rechecked at 2126: 157/83 HR 71. Pt states he feels like he is back to baseline. Pt states this has happened several times at home prior to admission. Dr. Alcario Drought updated.

## 2021-12-10 NOTE — ED Notes (Signed)
ED TO INPATIENT HANDOFF REPORT  ED Nurse Name and Phone #: [phone]  S Name/Age/Gender [name] [name] 71 y.o. male Room/Bed: 009C/009C  Code Status   Code Status: Not on file  Home/SNF/Other Home Patient oriented to: self, place, time, and situation Is this baseline? Yes   Triage Complete: Triage complete  Chief Complaint GI bleed [K92.2]  Triage Note Pt has noticed blood in his stool since Sunday.  Was seen by pcp to told him to stop taking baby asa and bp meds b/c his bp was dropping.  Denies abdominal pain, nausea or weakness.   Allergies Allergies  Allergen Reactions   Beta Adrenergic Blockers Other (See Comments)    bradycardia   Celebrex [Celecoxib] Other (See Comments)    Stroke like symptoms   Crestor [Rosuvastatin]     weakness   Detrol [Tolterodine] Other (See Comments)    headache   Ditropan [Oxybutynin] Other (See Comments)    Dry mouth   Norvasc [Amlodipine Besylate] Swelling   Tadalafil Other (See Comments)    headache   Toprol Xl [Metoprolol] Other (See Comments)    dizzy    Level of Care/Admitting Diagnosis ED Disposition     ED Disposition  Admit   Condition  --   [place]: [facility] [[id number]]  Level of Care: Med-Surg [16]  May place patient in observation at [facility] or [place] if equivalent level of care is available:: No  Covid Evaluation: Asymptomatic - no recent exposure (last 10 days) testing not required  Diagnosis: GI bleed [[id number]]  Admitting Physician: Mariel Aloe [[id number]]  Attending Physician: Mariel Aloe [phone]          B Medical/Surgery History Past Medical History:  Diagnosis Date   BENIGN PROSTATIC HYPERTROPHY 05/06/2010   COMMON MIGRAINE 07/31/2007   GERD 07/31/2007   HYPERLIPIDEMIA 01/04/2007   HYPERTENSION 01/04/2007   OVERACTIVE BLADDER 03/06/2007   Prostate cancer ([place]) 02/04/15   Past Surgical History:  Procedure Laterality Date   COLONOSCOPY  2009      A IV Location/Drains/Wounds Patient Lines/Drains/Airways Status     Active Line/Drains/Airways     Name Placement date Placement time Site Days   Peripheral IV 12/10/21 20 G Right Forearm 12/10/21  1100  Forearm  less than 1            Intake/Output Last 24 hours No intake or output data in the 24 hours ending 12/10/21 1741  Labs/Imaging Results for orders placed or performed during the hospital encounter of 12/10/21 (from the past 48 hour(s))  Comprehensive metabolic panel     Status: Abnormal   Collection Time: 12/10/21 10:30 AM  Result Value Ref Range   Sodium 139 135 - 145 mmol/L   Potassium 4.0 3.5 - 5.1 mmol/L   Chloride 105 98 - 111 mmol/L   CO2 28 22 - 32 mmol/L   Glucose, Bld 119 (H) 70 - 99 mg/dL    Comment: Glucose reference range applies only to samples taken after fasting for at least 8 hours.   BUN 10 8 - 23 mg/dL   Creatinine, Ser 1.19 0.61 - 1.24 mg/dL   Calcium 9.2 8.9 - 10.3 mg/dL   Total Protein 7.3 6.5 - 8.1 g/dL   Albumin 4.0 3.5 - 5.0 g/dL   AST 21 15 - 41 U/L   ALT 18 0 - 44 U/L   Alkaline Phosphatase 52 38 - 126 U/L   Total Bilirubin 0.9 0.3 -  1.2 mg/dL   GFR, Estimated >60 >60 mL/min    Comment: (NOTE) Calculated using the CKD-EPI Creatinine Equation (2021)    Anion gap 6 5 - 15    Comment: Performed at [facility], [place] [address] [place], [place] [address]  CBC     Status: Abnormal   Collection Time: 12/10/21 10:30 AM  Result Value Ref Range   WBC 8.0 4.0 - 10.5 K/uL   RBC 3.90 (L) 4.22 - 5.81 MIL/uL   Hemoglobin 12.3 (L) 13.0 - 17.0 g/dL   HCT 36.6 (L) 39.0 - 52.0 %   MCV 93.8 80.0 - 100.0 fL   MCH 31.5 26.0 - 34.0 pg   MCHC 33.6 30.0 - 36.0 g/dL   RDW 13.5 11.5 - 15.5 %   Platelets 295 150 - 400 K/uL   nRBC 0.0 0.0 - 0.2 %    Comment: Performed at [facility], [place] [address] [place], [place] [address]  Type and screen [place]     Status: None   Collection Time: 12/10/21 10:30 AM  Result  Value Ref Range   ABO/RH(D) A POS    Antibody Screen NEG    Sample Expiration      12/13/2021,2359 Performed at [facility], [place] [address] [place], [place] [address]   POC occult blood, ED     Status: Abnormal   Collection Time: 12/10/21 11:43 AM  Result Value Ref Range   Fecal Occult Bld POSITIVE (A) NEGATIVE  CBC     Status: Abnormal   Collection Time: 12/10/21  2:32 PM  Result Value Ref Range   WBC 9.2 4.0 - 10.5 K/uL   RBC 3.91 (L) 4.22 - 5.81 MIL/uL   Hemoglobin 12.5 (L) 13.0 - 17.0 g/dL   HCT 36.4 (L) 39.0 - 52.0 %   MCV 93.1 80.0 - 100.0 fL   MCH 32.0 26.0 - 34.0 pg   MCHC 34.3 30.0 - 36.0 g/dL   RDW 13.6 11.5 - 15.5 %   Platelets 286 150 - 400 K/uL   nRBC 0.0 0.0 - 0.2 %    Comment: Performed at [facility], [place] [address] [place], [place] [address]   CT Angio Abd/Pel W and/or Wo Contrast  Result Date: 12/10/2021 CLINICAL DATA:  GI bleed, lower Diverticular bleed EXAM: CTA ABDOMEN AND PELVIS WITHOUT AND WITH CONTRAST TECHNIQUE: Multidetector CT imaging of the abdomen and pelvis was performed using the standard protocol during bolus administration of intravenous contrast. Multiplanar reconstructed images and MIPs were obtained and reviewed to evaluate the vascular anatomy. RADIATION DOSE REDUCTION: This exam was performed according to the departmental dose-optimization program which includes automated exposure control, adjustment of the mA and/or kV according to patient size and/or use of iterative reconstruction technique. CONTRAST:  77m OMNIPAQUE IOHEXOL 350 MG/ML SOLN COMPARISON:  CT dated October 28, 2021. July 17, 2012 FINDINGS: VASCULAR Aorta: Minimal atherosclerotic calcifications throughout the nonaneurysmal abdominal aorta. Celiac: Patent without evidence of aneurysm, dissection, vasculitis or significant stenosis. SMA: Patent without evidence of aneurysm, dissection, vasculitis or significant stenosis. Renals: There is 1 RIGHT renal artery and 2 LEFT renal  arteries. They are patent. IMA: Patent. Inflow: Patent.  Minimal atherosclerotic calcifications. Proximal Outflow: Bilateral common femoral and visualized portions of the superficial and profunda femoral arteries are patent without evidence of aneurysm, dissection, vasculitis or significant stenosis. Veins: No obvious venous abnormality. Review of the MIP images confirms the above findings. NON-VASCULAR Lower chest: No  acute abnormality. Hepatobiliary: Cholelithiasis. No evidence of acute cholecystitis. Portal vein is patent. No suspicious focal hepatic lesion. No biliary ductal dilation. Pancreas: Unremarkable. No pancreatic ductal dilatation or surrounding inflammatory changes. Spleen: Normal in size without focal abnormality. Adrenals/Urinary Tract: Adrenal glands are stable in appearance. No hydronephrosis. Kidneys enhance symmetrically. Bilateral renal cysts, the largest of which measures 4.3 cm in the superior pole of the LEFT kidney (for which no dedicated imaging follow-up is recommended) . Subcentimeter hypodense lesions are too small to accurately characterize. No obstructing nephrolithiasis. Bladder is unremarkable. Stomach/Bowel: There is a focal blush of arterial enhancement within the submucosal region of the gastric antrum. No suggestion of active contrast extravasation. This likely reflects a vascular malformation (series 9, image 48; series 11, image 50). No CT evidence of active contrast extravasation. Diverticulosis without evidence of acute diverticulitis. Appendix is stable and mildly prominent without evidence of acute inflammation. No evidence of bowel obstruction. Lymphatic: No suspicious lymphadenopathy identified. Reproductive: Metallic densities within the prostate. Other: No free air or free fluid. Musculoskeletal: Similar appearance of lucent lesions with sclerotic margins involving L1, L2 and LEFT T12 extending into the LEFT twelfth rib since 2014, most consistent with a benign  etiology. Similar bone island of T12. IMPRESSION: VASCULAR No evidence of active GI bleed. There is enhancing area in the gastric antrum which may reflect a vascular malformation and a potential source of bleeding. Consider correlation with endoscopy. NON-VASCULAR Diverticulosis without evidence of acute diverticulitis. Electronically Signed   By: Valentino Saxon M.D.   On: 12/10/2021 17:38    Pending Labs Unresulted Labs (From admission, onward)     Start     Ordered   12/10/21 1119  ABO/Rh  Once,   R        12/10/21 1119   Signed and Held  CBC  Tomorrow morning,   R        Signed and Held            Vitals/Pain Today's Vitals   12/10/21 1530 12/10/21 1600 12/10/21 1615 12/10/21 1700  BP: (!) 194/88 (!) 175/91 (!) 188/97 (!) 186/103  Pulse: (!) 55 67 63 65  Resp: (!) 22 (!) 22 (!) 26 17  Temp:      TempSrc:      SpO2: 100% 100% 99% 100%  Weight:      Height:      PainSc:        Isolation Precautions No active isolations  Medications Medications  atorvastatin (LIPITOR) tablet 40 mg (has no administration in time range)  benazepril (LOTENSIN) tablet 40 mg (has no administration in time range)  hydrALAZINE (APRESOLINE) tablet 50 mg (has no administration in time range)  hydrochlorothiazide (HYDRODIURIL) tablet 12.5 mg (has no administration in time range)  pantoprazole (PROTONIX) EC tablet 40 mg (has no administration in time range)  tamsulosin (FLOMAX) capsule 0.4 mg (has no administration in time range)  iohexol (OMNIPAQUE) 350 MG/ML injection 75 mL (75 mLs Intravenous Contrast Given 12/10/21 1655)    Mobility walks   R Recommendations: See Admitting Provider Note  Report given to: Penni Bombard RN  Additional Notes:

## 2021-12-10 NOTE — H&P (Signed)
History and Physical    Patient: [name] [name] TMH:[id number] DOB: Jul 29, 1950 DOA: 12/10/2021 DOS: the patient was seen and examined on 12/10/2021 PCP: Biagio Borg, MD  Patient coming from: Home  Chief Complaint:  Chief Complaint  Patient presents with   Blood In Stools   HPI: [name] [name] is a 71 y.o. male with medical history significant of prostate cancer, BPH, hyperlipidemia, GERD, overactive bladder, hypertension. Patient reports bloody stool that started about one week ago. When he had his initial episode, he called his doctor who told him to schedule an appointment two days prior to admission. One and two days prior to admission, patient reports recurrent bloody stools. Patient reports bloody stool as dark red, but not black. No associated nausea, vomiting, diarrhea or abdominal pain. Patient reports his physician prescribed Protonix, for which he took one dose. Last dose of Aspirin was three days prior to admission.  Review of Systems: As mentioned in the history of present illness. All other systems reviewed and are negative. Past Medical History:  Diagnosis Date   BENIGN PROSTATIC HYPERTROPHY 05/06/2010   COMMON MIGRAINE 07/31/2007   GERD 07/31/2007   HYPERLIPIDEMIA 01/04/2007   HYPERTENSION 01/04/2007   OVERACTIVE BLADDER 03/06/2007   Prostate cancer ([place]) 02/04/15   Past Surgical History:  Procedure Laterality Date   COLONOSCOPY  2009   Social History:  reports that he has never smoked. He has never used smokeless tobacco. He reports that he does not drink alcohol and does not use drugs.  Allergies  Allergen Reactions   Beta Adrenergic Blockers Other (See Comments)    bradycardia   Celebrex [Celecoxib] Other (See Comments)    Stroke like symptoms   Crestor [Rosuvastatin]     weakness   Detrol [Tolterodine] Other (See Comments)    headache   Ditropan [Oxybutynin] Other (See Comments)    Dry mouth   Norvasc [Amlodipine Besylate] Swelling   Tadalafil Other (See  Comments)    headache   Toprol Xl [Metoprolol] Other (See Comments)    dizzy    Family History  Problem Relation Age of Onset   Lung cancer Father 40   Heart disease Brother 46       died from MI    Colon cancer Neg Hx    Esophageal cancer Neg Hx    Rectal cancer Neg Hx    Stomach cancer Neg Hx     Prior to Admission medications   Medication Sig Start Date End Date Taking? Authorizing Provider  aspirin 81 MG tablet Take 81 mg by mouth daily.     [provider]  atorvastatin (LIPITOR) 40 MG tablet Take 1 tablet (40 mg total) by mouth daily. 07/20/21   Biagio Borg, MD  benazepril (LOTENSIN) 40 MG tablet Take 1 tablet (40 mg total) by mouth daily. 08/31/21   Biagio Borg, MD  Cholecalciferol 50 MCG (2000 UT) TABS 1 tab by mouth once daily 07/20/21   Biagio Borg, MD  dorzolamide-timolol (COSOPT) 22.3-6.8 MG/ML ophthalmic solution 1 drop 2 (two) times daily. 09/24/20   [provider]  GEMTESA 75 MG TABS Take 1 tablet by mouth daily. 11/29/21   [provider]  hydrALAZINE (APRESOLINE) 50 MG tablet Take 1 tablet (50 mg total) by mouth 3 (three) times daily. Patient not taking: Reported on 12/08/2021 07/26/21   Biagio Borg, MD  hydrochlorothiazide (MICROZIDE) 12.5 MG capsule Take 1 capsule (12.5 mg total) by mouth daily. 08/31/21   Biagio Borg,  MD  latanoprost (XALATAN) 0.005 % ophthalmic solution Place 1 drop into the right eye daily. 11/09/17   [provider]  pantoprazole (PROTONIX) 40 MG tablet Take 1 tablet (40 mg total) by mouth daily. 12/08/21   Biagio Borg, MD  polyethylene glycol [facility] / Floria Raveling) packet Take 17 g by mouth as needed.    [provider]  potassium chloride (KLOR-CON 10) 10 MEQ tablet Take 1 tablet (10 mEq total) by mouth daily. 07/20/21   Biagio Borg, MD  sildenafil (REVATIO) 20 MG tablet TAKE THREE TABLETS BY MOUTH AS NEEDED 04/26/21   Biagio Borg, MD  SYSTANE ULTRA 0.4-0.3 % SOLN SMARTSIG:1 Drop(s) In Eye(s) PRN  08/14/19   [provider]  tamsulosin (FLOMAX) 0.4 MG CAPS capsule TAKE 1 CAPSULE BY MOUTH EVERY DAY 11/09/21   Biagio Borg, MD    Physical Exam: Vitals:   12/10/21 1430 12/10/21 1515 12/10/21 1530 12/10/21 1600  BP: (!) 182/108 (!) 176/88 (!) 194/88 (!) 175/91  Pulse: 60 (!) 59 (!) 55 67  Resp: (!) 21 19 (!) 22 (!) 22  Temp:      TempSrc:      SpO2: 100% 99% 100% 100%  Weight:      Height:       General exam: Appears calm and comfortable and in no acute distress. Conversant Respiratory: Clear to auscultation. Respiratory effort normal with no intercostal retractions or use of accessory muscles Cardiovascular: S1 & S2 heard, RRR. No murmurs, rubs, gallops or clicks. No LE edema Gastrointestinal: Abdomen is non-distended, soft and non-tender. No masses felt. Normal bowel sounds heard Neurologic: No focal neurological deficits Musculoskeletal: No calf tenderness Skin: No cyanosis. No new rashes Psychiatry: Alert and oriented x4. Memory intact. Mood & affect appropriate  Data Reviewed: There are no new results to review at this time.  Assessment and Plan: * GI bleed Uncertain etiology. No history of melena reported. History of diverticulosis on most recent colonoscopy. Hemoglobin stable. [facility] consulted on admission. -[facility] recommendations: CTA abdomen/pelvis -NPO -IV fluids -CBC in AM  Acute blood loss anemia Secondary to GI bleeding. Baseline hemoglobin of 15. Hemoglobin of 12.3 > 12.5 on admission. Mild anemia. -CBC in AM  Primary hypertension Patient is on benazepril, hydrochlorothiazide and hydralazine as an outpatient. Patient has held his antihypertensives secondary to active GI bleeding and PCP's concern for possible resultant hypotension. Blood pressure uncontrolled but patient is asymptomatic. -Continue to monitor for symptoms concerning for hypertensive emergency -Hydralazine IV prn for hypertensive emergency symptoms  BPH (benign prostatic  hyperplasia) -Continue Flomax  FREQUENCY, URINARY -Continue Gemtesa  GERD -Continue Protonix  Hypercholesterolemia -Continue Lipitor    Advance Care Planning: Full Code  Consults: [facility]  Family Communication: Wife at bedside   Author: Cordelia Poche, MD 12/10/2021 4:28 PM  For on call review [email or web address].

## 2021-12-10 NOTE — Assessment & Plan Note (Addendum)
Patient is on benazepril, hydrochlorothiazide and hydralazine as an outpatient. Patient has held his antihypertensives secondary to active GI bleeding and PCP's concern for possible resultant hypotension. Blood pressure uncontrolled but patient is asymptomatic.Patient resumed on home medications in the ED with resultant hypotension requiring IV fluids for resuscitation. Continue benazepril on discharge. Discontinue hydrochlorothiazide and hydralazine. PCP follow-up for continued titration of antihypertensives.

## 2021-12-10 NOTE — Consult Note (Addendum)
Referring Provider: Dr. Daleen Bo Primary Care Physician:  Biagio Borg, MD Primary Gastroenterologist:  Dr. Scarlette Shorts  Reason for Consultation: GI bleed  HPI: [name] [name] is a 71 y.o. male with a past medical history of hypertension, hyperlipidemia, GERD and diverticulosis.  He passed a small amount of pink blood in his stool a few days early last week.  Sunday 12/04/2021 he passed a large amount of dark red blood without passing any stool.  No associated abdominal pain.  He contacted Dr. Henrene Pastor on Monday 7/31 and he was advised to go to the ED if he had any further rectal bleeding and a follow-up appointment was scheduled in our office on 12/20/2021.  Tuesday 8/1 he passed red blood per the rectum x1 without any further bleeding for the next 2 days.  Friday 8/4 he had 3 episodes of large-volume dark red blood and x 1 similar episode earlier this morning.  No associated dizziness, chest pain or shortness of breath. He presented to [facility] today for further evaluation regarding hematochezia which started 1 week ago.  Labs in the ED showed a hemoglobin level of 12.3 (Hg 14.9 one month ago).  Hematocrit 36.6.  Platelet 295.  Abdominal/pelvic CTA ordered, not yet completed.  He takes aspirin 81 mg 3 days weekly.  No other NSAIDs.  He has passed 2 large volume dark red blood per the rectum since arriving to the ED.  He is hypertensive.  He stated his blood pressure has been running low and his PCP instructed him to hold his BP medications for few days.  No prior history of GI bleed.  No GERD symptoms.  His most recent colonoscopy was 12/19/2017 which identified diverticulosis in the left and right colon, no polyps.  No known family history of colorectal cancer.  Non-smoker.  No alcohol use.  No history of cardiac or pulmonary disease.  His wife is at the bedside  PAST GI PROCEDURES:  Colonoscopy 12/19/2017: - Diverticulosis in the left colon and in the right colon. - The examination was  otherwise normal on direct and retroflexion views. - No specimens collected.  Colonoscopy 10/22/2007: Diverticulosis ascending colon to sigmoid colon  ECHO 12/19/2017: 1. Left ventricular ejection fraction, by visual estimation, is 60 to 65%. The left ventricle has normal function. Left ventricular septal wall thickness was normal. Normal left ventricular posterior wall thickness. There is no left ventricular hypertrophy. 2. Left ventricular diastolic Doppler parameters are consistent with pseudonormalization pattern of LV diastolic filling. 3. GLS = -19.0%. 4. Global right ventricle has normal systolic function.The right ventricular size is normal. No increase in right ventricular wall thickness. 5. Left atrial size was normal. 6. Right atrial size was normal. 7. The mitral valve is normal in structure. Trace mitral valve regurgitation. 8. The tricuspid valve is normal in structure. Tricuspid valve regurgitation was not visualized by color flow Doppler. 9. The aortic valve The aortic valve is tricuspid Aortic valve regurgitation was not visualized by color flow Doppler. Mild aortic valve sclerosis without stenosis. 10. The pulmonic valve was normal in structure. Pulmonic valve regurgitation is not visualized by color flow Doppler. 11. The atrial septum is grossly normal.   Past Medical History:  Diagnosis Date   BENIGN PROSTATIC HYPERTROPHY 05/06/2010   COMMON MIGRAINE 07/31/2007   GERD 07/31/2007   HYPERLIPIDEMIA 01/04/2007   HYPERTENSION 01/04/2007   OVERACTIVE BLADDER 03/06/2007   Prostate cancer ([place]) 02/04/15    Past Surgical History:  Procedure Laterality Date  COLONOSCOPY  2009    Prior to Admission medications   Medication Sig Start Date End Date Taking? Authorizing Provider  aspirin 81 MG tablet Take 81 mg by mouth daily.     [provider]  atorvastatin (LIPITOR) 40 MG tablet Take 1 tablet (40 mg total) by mouth daily. 07/20/21   Biagio Borg, MD   benazepril (LOTENSIN) 40 MG tablet Take 1 tablet (40 mg total) by mouth daily. 08/31/21   Biagio Borg, MD  Cholecalciferol 50 MCG (2000 UT) TABS 1 tab by mouth once daily 07/20/21   Biagio Borg, MD  dorzolamide-timolol (COSOPT) 22.3-6.8 MG/ML ophthalmic solution 1 drop 2 (two) times daily. 09/24/20   [provider]  GEMTESA 75 MG TABS Take 1 tablet by mouth daily. 11/29/21   [provider]  hydrALAZINE (APRESOLINE) 50 MG tablet Take 1 tablet (50 mg total) by mouth 3 (three) times daily. Patient not taking: Reported on 12/08/2021 07/26/21   Biagio Borg, MD  hydrochlorothiazide (MICROZIDE) 12.5 MG capsule Take 1 capsule (12.5 mg total) by mouth daily. 08/31/21   Biagio Borg, MD  latanoprost (XALATAN) 0.005 % ophthalmic solution Place 1 drop into the right eye daily. 11/09/17   [provider]  pantoprazole (PROTONIX) 40 MG tablet Take 1 tablet (40 mg total) by mouth daily. 12/08/21   Biagio Borg, MD  polyethylene glycol [facility] / Floria Raveling) packet Take 17 g by mouth as needed.    [provider]  potassium chloride (KLOR-CON 10) 10 MEQ tablet Take 1 tablet (10 mEq total) by mouth daily. 07/20/21   Biagio Borg, MD  sildenafil (REVATIO) 20 MG tablet TAKE THREE TABLETS BY MOUTH AS NEEDED 04/26/21   Biagio Borg, MD  SYSTANE ULTRA 0.4-0.3 % SOLN SMARTSIG:1 Drop(s) In Eye(s) PRN 08/14/19   [provider]  tamsulosin (FLOMAX) 0.4 MG CAPS capsule TAKE 1 CAPSULE BY MOUTH EVERY DAY 11/09/21   Biagio Borg, MD    No current facility-administered medications for this encounter.   Current Outpatient Medications  Medication Sig Dispense Refill   aspirin 81 MG tablet Take 81 mg by mouth daily.      atorvastatin (LIPITOR) 40 MG tablet Take 1 tablet (40 mg total) by mouth daily. 90 tablet 3   benazepril (LOTENSIN) 40 MG tablet Take 1 tablet (40 mg total) by mouth daily. 90 tablet 3   Cholecalciferol 50 MCG (2000 UT) TABS 1 tab by mouth once daily 30 tablet 99    dorzolamide-timolol (COSOPT) 22.3-6.8 MG/ML ophthalmic solution 1 drop 2 (two) times daily.     GEMTESA 75 MG TABS Take 1 tablet by mouth daily.     hydrALAZINE (APRESOLINE) 50 MG tablet Take 1 tablet (50 mg total) by mouth 3 (three) times daily. (Patient not taking: Reported on 12/08/2021) 90 tablet 11   hydrochlorothiazide (MICROZIDE) 12.5 MG capsule Take 1 capsule (12.5 mg total) by mouth daily. 90 capsule 3   latanoprost (XALATAN) 0.005 % ophthalmic solution Place 1 drop into the right eye daily.  1   pantoprazole (PROTONIX) 40 MG tablet Take 1 tablet (40 mg total) by mouth daily. 30 tablet 3   polyethylene glycol (MIRALAX / GLYCOLAX) packet Take 17 g by mouth as needed.     potassium chloride (KLOR-CON 10) 10 MEQ tablet Take 1 tablet (10 mEq total) by mouth daily. 90 tablet 3   sildenafil (REVATIO) 20 MG tablet TAKE THREE TABLETS BY MOUTH AS NEEDED 90 tablet 11  SYSTANE ULTRA 0.4-0.3 % SOLN SMARTSIG:1 Drop(s) In Eye(s) PRN     tamsulosin (FLOMAX) 0.4 MG CAPS capsule TAKE 1 CAPSULE BY MOUTH EVERY DAY 90 capsule 1    Allergies as of 12/10/2021 - Review Complete 12/10/2021  Allergen Reaction Noted   Beta adrenergic blockers Other (See Comments) 08/31/2021   Celebrex [celecoxib] Other (See Comments) 01/04/2007   Crestor [rosuvastatin]  10/29/2012   Detrol [tolterodine] Other (See Comments) 02/24/2014   Ditropan [oxybutynin] Other (See Comments) 10/13/2014   Norvasc [amlodipine besylate] Swelling 11/20/2017   Tadalafil Other (See Comments) 06/12/2018   Toprol xl [metoprolol] Other (See Comments) 09/25/2019    Family History  Problem Relation Age of Onset   Lung cancer Father 60   Heart disease Brother 56       died from MI    Colon cancer Neg Hx    Esophageal cancer Neg Hx    Rectal cancer Neg Hx    Stomach cancer Neg Hx     Social History   Socioeconomic History   Marital status: Married    Spouse name: Not on file   Number of children: 3   Years of education: Not on file    Highest education level: Not on file  Occupational History   Occupation: pastor  Tobacco Use   Smoking status: Never   Smokeless tobacco: Never  Vaping Use   Vaping Use: Never used  Substance and Sexual Activity   Alcohol use: No    Alcohol/week: 0.0 standard drinks of alcohol   Drug use: No   Sexual activity: Yes  Other Topics Concern   Not on file  Social History Narrative   Not on file   Social Determinants of Health   Financial Resource Strain: Low Risk  (09/30/2021)   Overall Financial Resource Strain (CARDIA)    Difficulty of Paying Living Expenses: Not hard at all  Food Insecurity: No Food Insecurity (09/30/2021)   Hunger Vital Sign    Worried About Running Out of Food in the Last Year: Never true    Ran Out of Food in the Last Year: Never true  Transportation Needs: No Transportation Needs (09/30/2021)   PRAPARE - Hydrologist (Medical): No    Lack of Transportation (Non-Medical): No  Physical Activity: Insufficiently Active (09/30/2021)   Exercise Vital Sign    Days of Exercise per Week: 2 days    Minutes of Exercise per Session: 30 min  Stress: No Stress Concern Present (09/30/2021)   [place]    Feeling of Stress : Not at all  Social Connections: [place] (09/30/2021)   Social Connection and Isolation Panel [NHANES]    Frequency of Communication with Friends and Family: More than three times a week    Frequency of Social Gatherings with Friends and Family: Three times a week    Attends Religious Services: More than 4 times per year    Active Member of Clubs or Organizations: No    Attends Archivist Meetings: 1 to 4 times per year    Marital Status: Married  Human resources officer Violence: Not At Risk (09/30/2021)   Humiliation, Afraid, Rape, and Kick questionnaire    Fear of Current or Ex-Partner: No    Emotionally Abused: No    Physically Abused: No     Sexually Abused: No    Review of Systems: Gen: Denies fever, sweats or chills. No weight loss.  CV: Denies chest  pain, palpitations or edema. Resp: Denies cough, shortness of breath of hemoptysis.  GI: See HPI. GU : Denies urinary burning, blood in urine, increased urinary frequency or incontinence. MS: Denies joint pain, muscles aches or weakness. Derm: Denies rash, itchiness, skin lesions or unhealing ulcers. Psych: Denies depression, anxiety or memory loss. Heme: Denies easy bruising, bleeding. Neuro:  Denies headaches, dizziness or paresthesias. Endo:  Denies any problems with DM, thyroid or adrenal function.  Physical Exam: Vital signs in last 24 hours: Temp:  [97.9 F (36.6 C)] 97.9 F (36.6 C) (08/05 1021) Pulse Rate:  [52-70] 53 (08/05 1315) Resp:  [13-28] 20 (08/05 1315) BP: (166-200)/(84-103) 177/84 (08/05 1315) SpO2:  [99 %-100 %] 100 % (08/05 1315) Weight:  [88.9 kg] 88.9 kg (08/05 1021)   General:  Alert 71 year old male in no acute distress. Head:  Normocephalic and atraumatic. Eyes:  No scleral icterus. Conjunctiva pink. Ears:  Normal auditory acuity. Nose:  No deformity, discharge or lesions. Mouth:  Dentition intact. No ulcers or lesions.  Neck:  Supple. No lymphadenopathy or thyromegaly.  Lungs: Breath sounds clear throughout. Heart: Regular rate and rhythm, no murmurs. Abdomen: Soft, protuberant.  Nontender.  Positive bowel sounds to all 4 quadrants. Rectal: Deferred. Musculoskeletal:  Symmetrical without gross deformities.  Pulses:  Normal pulses noted. Extremities:  Without clubbing or edema. Neurologic:  Alert and  oriented x 4. No focal deficits.  Skin:  Intact without significant lesions or rashes. Psych:  Alert and cooperative. Normal mood and affect.  Intake/Output from previous day: No intake/output data recorded. Intake/Output this shift: No intake/output data recorded.  Lab Results: Recent Labs    12/08/21 1625 12/10/21 1030  WBC  8.8 8.0  HGB 11.9* 12.3*  HCT 35.8* 36.6*  PLT 256.0 295   BMET Recent Labs    12/08/21 1625 12/10/21 1030  NA 142 139  K 4.0 4.0  CL 108 105  CO2 29 28  GLUCOSE 111* 119*  BUN 14 10  CREATININE 1.14 1.19  CALCIUM 9.3 9.2   LFT Recent Labs    12/08/21 1625 12/10/21 1030  PROT 7.1 7.3  ALBUMIN 4.2 4.0  AST 16 21  ALT 14 18  ALKPHOS 49 52  BILITOT 0.6 0.9  BILIDIR 0.2  --    PT/INR Recent Labs    12/08/21 1625  LABPROT 11.3  INR 1.0   Hepatitis Panel No results for input(s): "HEPBSAG", "HCVAB", "HEPAIGM", "HEPBIGM" in the last 72 hours.    Studies/Results: No results found.  IMPRESSION/PLAN:  71 year old male with a history of right and left colon diverticulosis with hematochezia, suspect diverticular bleed.  Abdominal/pelvic CTA ordered, not yet completed.  Hemoglobin 12.3 (baseline hemoglobin level 14.9).  He passed 2 large volume dark red blood per the rectum since arriving to the ED. -NPO -Await CTA result, if active bleeding identified may require IR consult for coil embolization -Monitor H&H every 6hrs x 24 hours -Transfuse for hemoglobin less than 8 or if symptomatic -IV fluids per the hospitalist -Pantoprazole 40 mg p.o. daily -Further recommendations per Dr. Lyndel Safe  Hypertension, off BP meds for the past few days due to transient hypotension.      Noralyn Pick  12/10/2021, 1:26 PM    Attending physician's note   I have taken history, reviewed the chart and examined the patient. I performed a substantive portion of this encounter, including complete performance of at least one of the key components, in conjunction with the APP. I agree with the Advanced Practitioner's  note, impression and recommendations.   Painless hematochezia- c/w diverticular bleed. Prev colon 2019 with pancolonic div. HD stable. Hb 12 (basline 15, a month ago)  Plan: -CTA. If +, IR embolization. -Trend CBC -Colonoscopy only if continued  bleeding  Addendum: CTA- No evidence of active GI bleed. There is enhancing area in the gastric antrum which may reflect a vascular malformation and a potential source of bleeding. Consider correlation with endoscopy.  Plan to proceed with EGD in AM.  IV Protonix.      Carmell Austria, MD [facility] [phone]

## 2021-12-10 NOTE — ED Notes (Signed)
Chaperoned rectal/anal exam. Dark foul smelling stool collected by Dr. Eulis Foster.

## 2021-12-10 NOTE — Assessment & Plan Note (Addendum)
Secondary to GI bleeding. Baseline hemoglobin of 15. Hemoglobin of 12.3 > 12.5 on admission. Mild anemia. Hemoglobin stable prior to discharge.

## 2021-12-10 NOTE — Assessment & Plan Note (Addendum)
-  Continue Lipitor °

## 2021-12-10 NOTE — Progress Notes (Signed)
At shift change patient's wife call RN into room, Pt c/o feeling bad, light headed and diaphoretic, BP check at [place] 61/49 and RN 46. RRT paged and MD notified. EKG and 500cc bolus order. MD and RRT at bedside. At 2004 BP check 154/99, HR 69. MD order to transfer patient. At 2120 BP dropped again to 72/43. Report called to [place] and pt transported by RN. We continue to monitor.

## 2021-12-10 NOTE — Assessment & Plan Note (Addendum)
Continue [name] [name]

## 2021-12-10 NOTE — ED Provider Notes (Signed)
[facility] EMERGENCY DEPARTMENT Provider Note   CSN: [id number] Arrival date & time: 12/10/21  0945     History  Chief Complaint  Patient presents with   Blood In Stools    [name] [name] is a 71 y.o. male.  HPI He is here evaluation of rectal bleeding intermittently for the last week.  He is seen 3 times.  He saw his doctor 3 days ago for checkup.  At that time he was told to stop taking aspirin.  He did not have a rectal exam done.  He states he previous had colonoscopy and told that he "had weak areas there."  He denies dizziness, chest pain, shortness of breath, near-syncope or syncope.  There are no other known modifying factors.    Home Medications Prior to Admission medications   Medication Sig Start Date End Date Taking? Authorizing Provider  aspirin 81 MG tablet Take 81 mg by mouth daily.    Yes [provider]  aspirin-acetaminophen-caffeine (EXCEDRIN MIGRAINE) [phone] MG tablet Take 1 tablet by mouth every 6 (six) hours as needed for migraine.   Yes [provider]  atorvastatin (LIPITOR) 40 MG tablet Take 1 tablet (40 mg total) by mouth daily. 07/20/21  Yes Biagio Borg, MD  Cholecalciferol 50 MCG (2000 UT) TABS 1 tab by mouth once daily Patient taking differently: Take 2,000 Units by mouth daily. 07/20/21  Yes Biagio Borg, MD  dorzolamide-timolol (COSOPT) 22.3-6.8 MG/ML ophthalmic solution Place 1 drop into both eyes 2 (two) times daily. 09/24/20  Yes [provider]  GEMTESA 75 MG TABS Take 75 mg by mouth daily. SAMPLES 11/29/21  Yes [provider]  latanoprost (XALATAN) 0.005 % ophthalmic solution Place 1 drop into both eyes at bedtime. 11/09/17  Yes [provider]  pantoprazole (PROTONIX) 40 MG tablet Take 1 tablet (40 mg total) by mouth daily. 12/08/21  Yes Biagio Borg, MD  polyethylene glycol [facility] / Floria Raveling) packet Take 17 g by mouth as needed for mild constipation.   Yes [provider]   potassium chloride (KLOR-CON 10) 10 MEQ tablet Take 1 tablet (10 mEq total) by mouth daily. 07/20/21  Yes Biagio Borg, MD  sildenafil (REVATIO) 20 MG tablet TAKE THREE TABLETS BY MOUTH AS NEEDED Patient taking differently: Take 60 mg by mouth as needed (erectile dysfunction). 04/26/21  Yes Biagio Borg, MD  SYSTANE ULTRA 0.4-0.3 % SOLN Place 1 drop into both eyes daily as needed (dryness). 08/14/19  Yes [provider]  tamsulosin (FLOMAX) 0.4 MG CAPS capsule TAKE 1 CAPSULE BY MOUTH EVERY DAY Patient taking differently: Take 0.4 mg by mouth daily. 11/09/21  Yes Biagio Borg, MD  benazepril (LOTENSIN) 40 MG tablet Take 1 tablet (40 mg total) by mouth daily. Patient not taking: Reported on 12/10/2021 08/31/21   Biagio Borg, MD  hydrALAZINE (APRESOLINE) 50 MG tablet Take 1 tablet (50 mg total) by mouth 3 (three) times daily. Patient not taking: Reported on 12/08/2021 07/26/21   Biagio Borg, MD  hydrochlorothiazide (MICROZIDE) 12.5 MG capsule Take 1 capsule (12.5 mg total) by mouth daily. Patient not taking: Reported on 12/10/2021 08/31/21   Biagio Borg, MD      Allergies    Beta adrenergic blockers, Celebrex [celecoxib], Crestor [rosuvastatin], Detrol [tolterodine], Ditropan [oxybutynin], Norvasc [amlodipine besylate], Tadalafil, and Toprol xl [metoprolol]    Review of Systems   Review of Systems  Physical Exam Updated Vital Signs BP (!) 78/44 (BP Location:  Left Arm)   Pulse (!) 42   Temp 98.3 F (36.8 C)   Resp 17   Ht '5\' 7"'$  (1.702 m)   Wt 88.9 kg   SpO2 100%   BMI 30.70 kg/m  Physical Exam Vitals and nursing note reviewed.  Constitutional:      General: He is not in acute distress.    Appearance: He is well-developed. He is not ill-appearing, toxic-appearing or diaphoretic.  HENT:     Head: Normocephalic and atraumatic.     Right Ear: External ear normal.     Left Ear: External ear normal.  Eyes:     Conjunctiva/sclera: Conjunctivae normal.     Pupils: Pupils are  equal, round, and reactive to light.  Neck:     Trachea: Phonation normal.  Cardiovascular:     Rate and Rhythm: Normal rate and regular rhythm.     Heart sounds: Normal heart sounds.  Pulmonary:     Effort: Pulmonary effort is normal. No respiratory distress.     Breath sounds: Normal breath sounds. No stridor.  Abdominal:     General: There is no distension.     Palpations: Abdomen is soft.     Tenderness: There is no abdominal tenderness.  Genitourinary:    Comments: Normal anus.  No external lesions.  Melena in rectum.  No rectal mass.  No feces in rectum. Musculoskeletal:        General: Normal range of motion.     Cervical back: Normal range of motion and neck supple.  Skin:    General: Skin is warm and dry.  Neurological:     Mental Status: He is alert and oriented to person, place, and time.     Cranial Nerves: No cranial nerve deficit.     Sensory: No sensory deficit.     Motor: No abnormal muscle tone.     Coordination: Coordination normal.  Psychiatric:        Mood and Affect: Mood normal.        Behavior: Behavior normal.        Thought Content: Thought content normal.        Judgment: Judgment normal.     ED Results / Procedures / Treatments   Labs (all labs ordered are listed, but only abnormal results are displayed) Labs Reviewed  COMPREHENSIVE METABOLIC PANEL - Abnormal; Notable for the following components:      Result Value   Glucose, Bld 119 (*)    All other components within normal limits  CBC - Abnormal; Notable for the following components:   RBC 3.90 (*)    Hemoglobin 12.3 (*)    HCT 36.6 (*)    All other components within normal limits  CBC - Abnormal; Notable for the following components:   RBC 3.91 (*)    Hemoglobin 12.5 (*)    HCT 36.4 (*)    All other components within normal limits  POC OCCULT BLOOD, ED - Abnormal; Notable for the following components:   Fecal Occult Bld POSITIVE (*)    All other components within normal limits  CBC   TYPE AND SCREEN    EKG EKG Interpretation  Date/Time:  Saturday December 10 2021 10:56:43 EDT Ventricular Rate:  61 PR Interval:  301 QRS Duration: 89 QT Interval:  405 QTC Calculation: 408 R Axis:   25 Text Interpretation: Sinus rhythm Prolonged PR interval Probable anteroseptal infarct, old since last tracing no significant change Confirmed by Daleen Bo [phone]) on 12/10/2021 1:06:06 PM  Radiology CT Angio Abd/Pel W and/or Wo Contrast  Result Date: 12/10/2021 CLINICAL DATA:  GI bleed, lower Diverticular bleed EXAM: CTA ABDOMEN AND PELVIS WITHOUT AND WITH CONTRAST TECHNIQUE: Multidetector CT imaging of the abdomen and pelvis was performed using the standard protocol during bolus administration of intravenous contrast. Multiplanar reconstructed images and MIPs were obtained and reviewed to evaluate the vascular anatomy. RADIATION DOSE REDUCTION: This exam was performed according to the departmental dose-optimization program which includes automated exposure control, adjustment of the mA and/or kV according to patient size and/or use of iterative reconstruction technique. CONTRAST:  88m OMNIPAQUE IOHEXOL 350 MG/ML SOLN COMPARISON:  CT dated October 28, 2021. July 17, 2012 FINDINGS: VASCULAR Aorta: Minimal atherosclerotic calcifications throughout the nonaneurysmal abdominal aorta. Celiac: Patent without evidence of aneurysm, dissection, vasculitis or significant stenosis. SMA: Patent without evidence of aneurysm, dissection, vasculitis or significant stenosis. Renals: There is 1 RIGHT renal artery and 2 LEFT renal arteries. They are patent. IMA: Patent. Inflow: Patent.  Minimal atherosclerotic calcifications. Proximal Outflow: Bilateral common femoral and visualized portions of the superficial and profunda femoral arteries are patent without evidence of aneurysm, dissection, vasculitis or significant stenosis. Veins: No obvious venous abnormality. Review of the MIP images confirms the above  findings. NON-VASCULAR Lower chest: No acute abnormality. Hepatobiliary: Cholelithiasis. No evidence of acute cholecystitis. Portal vein is patent. No suspicious focal hepatic lesion. No biliary ductal dilation. Pancreas: Unremarkable. No pancreatic ductal dilatation or surrounding inflammatory changes. Spleen: Normal in size without focal abnormality. Adrenals/Urinary Tract: Adrenal glands are stable in appearance. No hydronephrosis. Kidneys enhance symmetrically. Bilateral renal cysts, the largest of which measures 4.3 cm in the superior pole of the LEFT kidney (for which no dedicated imaging follow-up is recommended) . Subcentimeter hypodense lesions are too small to accurately characterize. No obstructing nephrolithiasis. Bladder is unremarkable. Stomach/Bowel: There is a focal blush of arterial enhancement within the submucosal region of the gastric antrum. No suggestion of active contrast extravasation. This likely reflects a vascular malformation (series 9, image 48; series 11, image 50). No CT evidence of active contrast extravasation. Diverticulosis without evidence of acute diverticulitis. Appendix is stable and mildly prominent without evidence of acute inflammation. No evidence of bowel obstruction. Lymphatic: No suspicious lymphadenopathy identified. Reproductive: Metallic densities within the prostate. Other: No free air or free fluid. Musculoskeletal: Similar appearance of lucent lesions with sclerotic margins involving L1, L2 and LEFT T12 extending into the LEFT twelfth rib since 2014, most consistent with a benign etiology. Similar bone island of T12. IMPRESSION: VASCULAR No evidence of active GI bleed. There is enhancing area in the gastric antrum which may reflect a vascular malformation and a potential source of bleeding. Consider correlation with endoscopy. NON-VASCULAR Diverticulosis without evidence of acute diverticulitis. Electronically Signed   By: SValentino SaxonM.D.   On: 12/10/2021  17:38    Procedures Procedures    Medications Ordered in ED Medications  atorvastatin (LIPITOR) tablet 40 mg (40 mg Oral Given 12/10/21 1805)  hydrALAZINE (APRESOLINE) tablet 50 mg (50 mg Oral Given 12/10/21 1805)  tamsulosin (FLOMAX) capsule 0.4 mg (0.4 mg Oral Given 12/10/21 1805)  hydrALAZINE (APRESOLINE) injection 10 mg (has no administration in time range)  mirabegron ER (MYRBETRIQ) tablet 25 mg (has no administration in time range)  0.45 % sodium chloride infusion ( Intravenous New Bag/Given 12/10/21 1825)  acetaminophen (TYLENOL) tablet 650 mg (has no administration in time range)    Or  acetaminophen (TYLENOL) suppository 650 mg (has no administration in time range)  HYDROcodone-acetaminophen (NORCO/VICODIN)  5-325 MG per tablet 1-2 tablet (has no administration in time range)  ondansetron (ZOFRAN) tablet 4 mg (has no administration in time range)    Or  ondansetron (ZOFRAN) injection 4 mg (has no administration in time range)  pantoprazole (PROTONIX) injection 40 mg (has no administration in time range)  lactated ringers bolus 1,000 mL (has no administration in time range)  iohexol (OMNIPAQUE) 350 MG/ML injection 75 mL (75 mLs Intravenous Contrast Given 12/10/21 1655)    ED Course/ Medical Decision Making/ A&P                           Medical Decision Making Patient with ongoing intermittent rectal bleeding, and melena on exam; symptoms for 1 week.  Prior colonoscopy 12/19/2017 showed multiple left and right colon diverticula.  Amount and/or Complexity of Data Reviewed Independent Historian:     Details: He is a cogent historian External Data Reviewed:     Details: PCP evaluation 12/08/2021 at that time discussed episodes of rectal bleeding.  He was told to stop taking his antihypertensives and aspirin.  Patient contacted his GI physician yesterday and was told to come to the ED for evaluation of rectal bleeding. Labs: ordered.    Details: CBC, metabolic panel, stool for occult  blood -- normal except hemoglobin slightly low 12.3, stool positive for blood.  Repeat CBC has hemoglobin stable unchanged Radiology: ordered and independent interpretation performed.    Details: CT abdomen pelvis-ordered to evaluate for source of bleeding.  Further care pending.  No clear source of bleeding but possible gastric source.  No active bleeding seen. Discussion of management or test interpretation with external provider(s): Case discussed with gastroenterology provider, Jaclyn Shaggy, from [facility] gastroenterology.  She will see the patient as a Optometrist.  She request CTA imaging to evaluate for source of bleeding.  CTA abdomen pelvis ordered by me.  Hospitalist consulted to admit for monitoring while work-up in process.  Risk Prescription drug management. Decision regarding hospitalization. Risk Details: Patient presenting with visible blood, has melena on exam, with history of diverticular bleeding.  BUN normal.  Suspect lower GI bleeding.  Symptoms somewhat progressive.  He had a large bowel movement in the ED and was diaphoretic on ambulation.  He was not hypotensive during orthostatic blood pressure and pulse testing.  We will repeat CBC at 4 hours and anticipate admission to hospitalist service with gastroenterology consultation.  Critical Care Total time providing critical care: 35 minutes           Final Clinical Impression(s) / ED Diagnoses Final diagnoses:  Rectal bleeding  Anemia, unspecified type    Rx / DC Orders ED Discharge Orders     None         Daleen Bo, MD 12/10/21 [phone]

## 2021-12-10 NOTE — ED Notes (Signed)
Mr. Kibbe ambulated back from the bathroom less than 20 feet from the gurney. He was diaphoretic when he got back to the bed but not dyspneic.

## 2021-12-10 NOTE — Significant Event (Signed)
Rapid Response Event Note   Reason for Call :  Hypotension, bradycardia  Initial Focused Assessment:  Pt laying in bed, alert and oriented. No c/o pain. HR 41, BP 71/45, RR 12, spO2 99% on RA. Pt states he feels "different" and has had this before when his BP drops. Denies recent bloody BM since transferring from ED.    Interventions:  1L Bolus Manual BP EKG-SB 1st deg AVB  2009: HR improved to 65 and BP 152/68 after approximately 463m of bolus.   Plan of Care:  Transfer to PCU for closer monitoring  Continue to monitor for hypotension and bradycardia. Hold BP meds for now. RN instructed to call with any changes or concerns.    Event Summary:   MD Notified: Dr GAlcario Drought CCM Call Time: 1915 Arrival Time: 1925 End Time: 2025  NSherilyn Dacosta RN

## 2021-12-10 NOTE — ED Triage Notes (Signed)
Pt has noticed blood in his stool since Sunday.  Was seen by pcp to told him to stop taking baby asa and bp meds b/c his bp was dropping.  Denies abdominal pain, nausea or weakness.

## 2021-12-10 NOTE — ED Notes (Signed)
This RN was notified of patient wanting to leave AMA by NT. This RN went in to talk to pt and educate pt about the risks of leaving AMA in his current status. This RN then notified MD about pt's want to leave AMA. MD at bedside now.

## 2021-12-10 NOTE — ED Notes (Signed)
Pt ambulated down hall way and back talking, no SOB , no dizziness and denies any headache or vision changes.

## 2021-12-10 NOTE — H&P (View-Only) (Signed)
Referring Provider: Dr. Daleen Bo Primary Care Physician:  Biagio Borg, MD Primary Gastroenterologist:  Dr. Scarlette Shorts  Reason for Consultation: GI bleed  HPI: [name] [name] is a 71 y.o. male with a past medical history of hypertension, hyperlipidemia, GERD and diverticulosis.  He passed a small amount of pink blood in his stool a few days early last week.  Sunday 12/04/2021 he passed a large amount of dark red blood without passing any stool.  No associated abdominal pain.  He contacted Dr. Henrene Pastor on Monday 7/31 and he was advised to go to the ED if he had any further rectal bleeding and a follow-up appointment was scheduled in our office on 12/20/2021.  Tuesday 8/1 he passed red blood per the rectum x1 without any further bleeding for the next 2 days.  Friday 8/4 he had 3 episodes of large-volume dark red blood and x 1 similar episode earlier this morning.  No associated dizziness, chest pain or shortness of breath. He presented to [facility] today for further evaluation regarding hematochezia which started 1 week ago.  Labs in the ED showed a hemoglobin level of 12.3 (Hg 14.9 one month ago).  Hematocrit 36.6.  Platelet 295.  Abdominal/pelvic CTA ordered, not yet completed.  He takes aspirin 81 mg 3 days weekly.  No other NSAIDs.  He has passed 2 large volume dark red blood per the rectum since arriving to the ED.  He is hypertensive.  He stated his blood pressure has been running low and his PCP instructed him to hold his BP medications for few days.  No prior history of GI bleed.  No GERD symptoms.  His most recent colonoscopy was 12/19/2017 which identified diverticulosis in the left and right colon, no polyps.  No known family history of colorectal cancer.  Non-smoker.  No alcohol use.  No history of cardiac or pulmonary disease.  His wife is at the bedside  PAST GI PROCEDURES:  Colonoscopy 12/19/2017: - Diverticulosis in the left colon and in the right colon. - The examination was  otherwise normal on direct and retroflexion views. - No specimens collected.  Colonoscopy 10/22/2007: Diverticulosis ascending colon to sigmoid colon  ECHO 12/19/2017: 1. Left ventricular ejection fraction, by visual estimation, is 60 to 65%. The left ventricle has normal function. Left ventricular septal wall thickness was normal. Normal left ventricular posterior wall thickness. There is no left ventricular hypertrophy. 2. Left ventricular diastolic Doppler parameters are consistent with pseudonormalization pattern of LV diastolic filling. 3. GLS = -19.0%. 4. Global right ventricle has normal systolic function.The right ventricular size is normal. No increase in right ventricular wall thickness. 5. Left atrial size was normal. 6. Right atrial size was normal. 7. The mitral valve is normal in structure. Trace mitral valve regurgitation. 8. The tricuspid valve is normal in structure. Tricuspid valve regurgitation was not visualized by color flow Doppler. 9. The aortic valve The aortic valve is tricuspid Aortic valve regurgitation was not visualized by color flow Doppler. Mild aortic valve sclerosis without stenosis. 10. The pulmonic valve was normal in structure. Pulmonic valve regurgitation is not visualized by color flow Doppler. 11. The atrial septum is grossly normal.   Past Medical History:  Diagnosis Date   BENIGN PROSTATIC HYPERTROPHY 05/06/2010   COMMON MIGRAINE 07/31/2007   GERD 07/31/2007   HYPERLIPIDEMIA 01/04/2007   HYPERTENSION 01/04/2007   OVERACTIVE BLADDER 03/06/2007   Prostate cancer ([place]) 02/04/15    Past Surgical History:  Procedure Laterality Date  COLONOSCOPY  2009    Prior to Admission medications   Medication Sig Start Date End Date Taking? Authorizing Provider  aspirin 81 MG tablet Take 81 mg by mouth daily.     [provider]  atorvastatin (LIPITOR) 40 MG tablet Take 1 tablet (40 mg total) by mouth daily. 07/20/21   Biagio Borg, MD   benazepril (LOTENSIN) 40 MG tablet Take 1 tablet (40 mg total) by mouth daily. 08/31/21   Biagio Borg, MD  Cholecalciferol 50 MCG (2000 UT) TABS 1 tab by mouth once daily 07/20/21   Biagio Borg, MD  dorzolamide-timolol (COSOPT) 22.3-6.8 MG/ML ophthalmic solution 1 drop 2 (two) times daily. 09/24/20   [provider]  GEMTESA 75 MG TABS Take 1 tablet by mouth daily. 11/29/21   [provider]  hydrALAZINE (APRESOLINE) 50 MG tablet Take 1 tablet (50 mg total) by mouth 3 (three) times daily. Patient not taking: Reported on 12/08/2021 07/26/21   Biagio Borg, MD  hydrochlorothiazide (MICROZIDE) 12.5 MG capsule Take 1 capsule (12.5 mg total) by mouth daily. 08/31/21   Biagio Borg, MD  latanoprost (XALATAN) 0.005 % ophthalmic solution Place 1 drop into the right eye daily. 11/09/17   [provider]  pantoprazole (PROTONIX) 40 MG tablet Take 1 tablet (40 mg total) by mouth daily. 12/08/21   Biagio Borg, MD  polyethylene glycol [facility] Floria Raveling) packet Take 17 g by mouth as needed.    [provider]  potassium chloride (KLOR-CON 10) 10 MEQ tablet Take 1 tablet (10 mEq total) by mouth daily. 07/20/21   Biagio Borg, MD  sildenafil (REVATIO) 20 MG tablet TAKE THREE TABLETS BY MOUTH AS NEEDED 04/26/21   Biagio Borg, MD  SYSTANE ULTRA 0.4-0.3 % SOLN SMARTSIG:1 Drop(s) In Eye(s) PRN 08/14/19   [provider]  tamsulosin (FLOMAX) 0.4 MG CAPS capsule TAKE 1 CAPSULE BY MOUTH EVERY DAY 11/09/21   Biagio Borg, MD    No current facility-administered medications for this encounter.   Current Outpatient Medications  Medication Sig Dispense Refill   aspirin 81 MG tablet Take 81 mg by mouth daily.      atorvastatin (LIPITOR) 40 MG tablet Take 1 tablet (40 mg total) by mouth daily. 90 tablet 3   benazepril (LOTENSIN) 40 MG tablet Take 1 tablet (40 mg total) by mouth daily. 90 tablet 3   Cholecalciferol 50 MCG (2000 UT) TABS 1 tab by mouth once daily 30 tablet 99    dorzolamide-timolol (COSOPT) 22.3-6.8 MG/ML ophthalmic solution 1 drop 2 (two) times daily.     GEMTESA 75 MG TABS Take 1 tablet by mouth daily.     hydrALAZINE (APRESOLINE) 50 MG tablet Take 1 tablet (50 mg total) by mouth 3 (three) times daily. (Patient not taking: Reported on 12/08/2021) 90 tablet 11   hydrochlorothiazide (MICROZIDE) 12.5 MG capsule Take 1 capsule (12.5 mg total) by mouth daily. 90 capsule 3   latanoprost (XALATAN) 0.005 % ophthalmic solution Place 1 drop into the right eye daily.  1   pantoprazole (PROTONIX) 40 MG tablet Take 1 tablet (40 mg total) by mouth daily. 30 tablet 3   polyethylene glycol (MIRALAX / GLYCOLAX) packet Take 17 g by mouth as needed.     potassium chloride (KLOR-CON 10) 10 MEQ tablet Take 1 tablet (10 mEq total) by mouth daily. 90 tablet 3   sildenafil (REVATIO) 20 MG tablet TAKE THREE TABLETS BY MOUTH AS NEEDED 90 tablet 11  SYSTANE ULTRA 0.4-0.3 % SOLN SMARTSIG:1 Drop(s) In Eye(s) PRN     tamsulosin (FLOMAX) 0.4 MG CAPS capsule TAKE 1 CAPSULE BY MOUTH EVERY DAY 90 capsule 1    Allergies as of 12/10/2021 - Review Complete 12/10/2021  Allergen Reaction Noted   Beta adrenergic blockers Other (See Comments) 08/31/2021   Celebrex [celecoxib] Other (See Comments) 01/04/2007   Crestor [rosuvastatin]  10/29/2012   Detrol [tolterodine] Other (See Comments) 02/24/2014   Ditropan [oxybutynin] Other (See Comments) 10/13/2014   Norvasc [amlodipine besylate] Swelling 11/20/2017   Tadalafil Other (See Comments) 06/12/2018   Toprol xl [metoprolol] Other (See Comments) 09/25/2019    Family History  Problem Relation Age of Onset   Lung cancer Father 70   Heart disease Brother 25       died from MI    Colon cancer Neg Hx    Esophageal cancer Neg Hx    Rectal cancer Neg Hx    Stomach cancer Neg Hx     Social History   Socioeconomic History   Marital status: Married    Spouse name: Not on file   Number of children: 3   Years of education: Not on file    Highest education level: Not on file  Occupational History   Occupation: pastor  Tobacco Use   Smoking status: Never   Smokeless tobacco: Never  Vaping Use   Vaping Use: Never used  Substance and Sexual Activity   Alcohol use: No    Alcohol/week: 0.0 standard drinks of alcohol   Drug use: No   Sexual activity: Yes  Other Topics Concern   Not on file  Social History Narrative   Not on file   Social Determinants of Health   Financial Resource Strain: Low Risk  (09/30/2021)   Overall Financial Resource Strain (CARDIA)    Difficulty of Paying Living Expenses: Not hard at all  Food Insecurity: No Food Insecurity (09/30/2021)   Hunger Vital Sign    Worried About Running Out of Food in the Last Year: Never true    Ran Out of Food in the Last Year: Never true  Transportation Needs: No Transportation Needs (09/30/2021)   PRAPARE - Hydrologist (Medical): No    Lack of Transportation (Non-Medical): No  Physical Activity: Insufficiently Active (09/30/2021)   Exercise Vital Sign    Days of Exercise per Week: 2 days    Minutes of Exercise per Session: 30 min  Stress: No Stress Concern Present (09/30/2021)   [place]    Feeling of Stress : Not at all  Social Connections: [place] (09/30/2021)   Social Connection and Isolation Panel [NHANES]    Frequency of Communication with Friends and Family: More than three times a week    Frequency of Social Gatherings with Friends and Family: Three times a week    Attends Religious Services: More than 4 times per year    Active Member of Clubs or Organizations: No    Attends Archivist Meetings: 1 to 4 times per year    Marital Status: Married  Human resources officer Violence: Not At Risk (09/30/2021)   Humiliation, Afraid, Rape, and Kick questionnaire    Fear of Current or Ex-Partner: No    Emotionally Abused: No    Physically Abused: No     Sexually Abused: No    Review of Systems: Gen: Denies fever, sweats or chills. No weight loss.  CV: Denies chest  pain, palpitations or edema. Resp: Denies cough, shortness of breath of hemoptysis.  GI: See HPI. GU : Denies urinary burning, blood in urine, increased urinary frequency or incontinence. MS: Denies joint pain, muscles aches or weakness. Derm: Denies rash, itchiness, skin lesions or unhealing ulcers. Psych: Denies depression, anxiety or memory loss. Heme: Denies easy bruising, bleeding. Neuro:  Denies headaches, dizziness or paresthesias. Endo:  Denies any problems with DM, thyroid or adrenal function.  Physical Exam: Vital signs in last 24 hours: Temp:  [97.9 F (36.6 C)] 97.9 F (36.6 C) (08/05 1021) Pulse Rate:  [52-70] 53 (08/05 1315) Resp:  [13-28] 20 (08/05 1315) BP: (166-200)/(84-103) 177/84 (08/05 1315) SpO2:  [99 %-100 %] 100 % (08/05 1315) Weight:  [88.9 kg] 88.9 kg (08/05 1021)   General:  Alert 71 year old male in no acute distress. Head:  Normocephalic and atraumatic. Eyes:  No scleral icterus. Conjunctiva pink. Ears:  Normal auditory acuity. Nose:  No deformity, discharge or lesions. Mouth:  Dentition intact. No ulcers or lesions.  Neck:  Supple. No lymphadenopathy or thyromegaly.  Lungs: Breath sounds clear throughout. Heart: Regular rate and rhythm, no murmurs. Abdomen: Soft, protuberant.  Nontender.  Positive bowel sounds to all 4 quadrants. Rectal: Deferred. Musculoskeletal:  Symmetrical without gross deformities.  Pulses:  Normal pulses noted. Extremities:  Without clubbing or edema. Neurologic:  Alert and  oriented x 4. No focal deficits.  Skin:  Intact without significant lesions or rashes. Psych:  Alert and cooperative. Normal mood and affect.  Intake/Output from previous day: No intake/output data recorded. Intake/Output this shift: No intake/output data recorded.  Lab Results: Recent Labs    12/08/21 1625 12/10/21 1030  WBC  8.8 8.0  HGB 11.9* 12.3*  HCT 35.8* 36.6*  PLT 256.0 295   BMET Recent Labs    12/08/21 1625 12/10/21 1030  NA 142 139  K 4.0 4.0  CL 108 105  CO2 29 28  GLUCOSE 111* 119*  BUN 14 10  CREATININE 1.14 1.19  CALCIUM 9.3 9.2   LFT Recent Labs    12/08/21 1625 12/10/21 1030  PROT 7.1 7.3  ALBUMIN 4.2 4.0  AST 16 21  ALT 14 18  ALKPHOS 49 52  BILITOT 0.6 0.9  BILIDIR 0.2  --    PT/INR Recent Labs    12/08/21 1625  LABPROT 11.3  INR 1.0   Hepatitis Panel No results for input(s): "HEPBSAG", "HCVAB", "HEPAIGM", "HEPBIGM" in the last 72 hours.    Studies/Results: No results found.  IMPRESSION/PLAN:  71 year old male with a history of right and left colon diverticulosis with hematochezia, suspect diverticular bleed.  Abdominal/pelvic CTA ordered, not yet completed.  Hemoglobin 12.3 (baseline hemoglobin level 14.9).  He passed 2 large volume dark red blood per the rectum since arriving to the ED. -NPO -Await CTA result, if active bleeding identified may require IR consult for coil embolization -Monitor H&H every 6hrs x 24 hours -Transfuse for hemoglobin less than 8 or if symptomatic -IV fluids per the hospitalist -Pantoprazole 40 mg p.o. daily -Further recommendations per Dr. Lyndel Safe  Hypertension, off BP meds for the past few days due to transient hypotension.      Noralyn Pick  12/10/2021, 1:26 PM    Attending physician's note   I have taken history, reviewed the chart and examined the patient. I performed a substantive portion of this encounter, including complete performance of at least one of the key components, in conjunction with the APP. I agree with the Advanced Practitioner's  note, impression and recommendations.   Painless hematochezia- c/w diverticular bleed. Prev colon 2019 with pancolonic div. HD stable. Hb 12 (basline 15, a month ago)  Plan: -CTA. If +, IR embolization. -Trend CBC -Colonoscopy only if continued  bleeding  Addendum: CTA- No evidence of active GI bleed. There is enhancing area in the gastric antrum which may reflect a vascular malformation and a potential source of bleeding. Consider correlation with endoscopy.  Plan to proceed with EGD in AM.  IV Protonix.      Carmell Austria, MD [facility] [phone]

## 2021-12-10 NOTE — Assessment & Plan Note (Addendum)
-   Continue Flomax 

## 2021-12-10 NOTE — Assessment & Plan Note (Addendum)
Uncertain etiology. No history of melena reported. History of diverticulosis on most recent colonoscopy. Hemoglobin stable. [facility] consulted on admission. CTA abdomen/pelvis significant for possible vascular malformation of the gastric antrum and diverticulosis without diverticulitis. Upper GI endoscopy performed on 8/6 revealing mild gastritis and a submucosal nodule of the duodenum without evidence of bleeding. Per GI, likely diverticular bleed. GI recommendations for no NSAIDS, resume aspirin in 1 week, GI follow-up in 3-4 weeks.

## 2021-12-10 NOTE — ED Notes (Signed)
Spots of bright red blood noted to the back of underwear and onto bedding.[name] [name] noted lots of blood in toilet after having a bowel movement but no mentioning of SOB or dizziness.

## 2021-12-10 NOTE — Assessment & Plan Note (Addendum)
Continue Protonix °

## 2021-12-11 ENCOUNTER — Encounter (HOSPITAL_COMMUNITY): Payer: Self-pay | Admitting: Family Medicine

## 2021-12-11 ENCOUNTER — Observation Stay (HOSPITAL_BASED_OUTPATIENT_CLINIC_OR_DEPARTMENT_OTHER): Payer: Medicare Other | Admitting: Anesthesiology

## 2021-12-11 ENCOUNTER — Observation Stay (HOSPITAL_COMMUNITY): Payer: Medicare Other | Admitting: Anesthesiology

## 2021-12-11 ENCOUNTER — Encounter (HOSPITAL_COMMUNITY)
Admission: EM | Disposition: A | Discharge status: Home / Self Care | Payer: Self-pay | Source: Home / Self Care | Attending: Emergency Medicine

## 2021-12-11 DIAGNOSIS — B9681 Helicobacter pylori [H. pylori] as the cause of diseases classified elsewhere: Secondary | ICD-10-CM | POA: Diagnosis not present

## 2021-12-11 DIAGNOSIS — D62 Acute posthemorrhagic anemia: Secondary | ICD-10-CM | POA: Diagnosis not present

## 2021-12-11 DIAGNOSIS — D649 Anemia, unspecified: Secondary | ICD-10-CM | POA: Diagnosis not present

## 2021-12-11 DIAGNOSIS — I10 Essential (primary) hypertension: Secondary | ICD-10-CM | POA: Diagnosis not present

## 2021-12-11 DIAGNOSIS — E7800 Pure hypercholesterolemia, unspecified: Secondary | ICD-10-CM | POA: Diagnosis not present

## 2021-12-11 DIAGNOSIS — K5791 Diverticulosis of intestine, part unspecified, without perforation or abscess with bleeding: Secondary | ICD-10-CM

## 2021-12-11 DIAGNOSIS — K3189 Other diseases of stomach and duodenum: Secondary | ICD-10-CM

## 2021-12-11 DIAGNOSIS — K2971 Gastritis, unspecified, with bleeding: Secondary | ICD-10-CM | POA: Diagnosis not present

## 2021-12-11 DIAGNOSIS — K2970 Gastritis, unspecified, without bleeding: Secondary | ICD-10-CM

## 2021-12-11 DIAGNOSIS — K921 Melena: Secondary | ICD-10-CM

## 2021-12-11 DIAGNOSIS — I959 Hypotension, unspecified: Secondary | ICD-10-CM

## 2021-12-11 DIAGNOSIS — K922 Gastrointestinal hemorrhage, unspecified: Secondary | ICD-10-CM | POA: Diagnosis not present

## 2021-12-11 DIAGNOSIS — R350 Frequency of micturition: Secondary | ICD-10-CM | POA: Diagnosis not present

## 2021-12-11 DIAGNOSIS — K219 Gastro-esophageal reflux disease without esophagitis: Secondary | ICD-10-CM | POA: Diagnosis not present

## 2021-12-11 DIAGNOSIS — N400 Enlarged prostate without lower urinary tract symptoms: Secondary | ICD-10-CM | POA: Diagnosis not present

## 2021-12-11 DIAGNOSIS — K2961 Other gastritis with bleeding: Secondary | ICD-10-CM | POA: Diagnosis not present

## 2021-12-11 HISTORY — PX: BIOPSY: SHX5522

## 2021-12-11 HISTORY — PX: ESOPHAGOGASTRODUODENOSCOPY (EGD) WITH PROPOFOL: SHX5813

## 2021-12-11 LAB — CBC
HCT: 33.2 % — ABNORMAL LOW (ref 39.0–52.0)
Hemoglobin: 11.5 g/dL — ABNORMAL LOW (ref 13.0–17.0)
MCH: 31.9 pg (ref 26.0–34.0)
MCHC: 34.6 g/dL (ref 30.0–36.0)
MCV: 92.2 fL (ref 80.0–100.0)
Platelets: 260 10*3/uL (ref 150–400)
RBC: 3.6 MIL/uL — ABNORMAL LOW (ref 4.22–5.81)
RDW: 13.4 % (ref 11.5–15.5)
WBC: 12.2 10*3/uL — ABNORMAL HIGH (ref 4.0–10.5)
nRBC: 0 % (ref 0.0–0.2)

## 2021-12-11 LAB — BASIC METABOLIC PANEL
Anion gap: 7 (ref 5–15)
BUN: 11 mg/dL (ref 8–23)
CO2: 26 mmol/L (ref 22–32)
Calcium: 8.8 mg/dL — ABNORMAL LOW (ref 8.9–10.3)
Chloride: 102 mmol/L (ref 98–111)
Creatinine, Ser: 1.21 mg/dL (ref 0.61–1.24)
GFR, Estimated: >60 mL/min (ref >60)
Glucose, Bld: 118 mg/dL — ABNORMAL HIGH (ref 70–99)
Potassium: 3.7 mmol/L (ref 3.5–5.1)
Sodium: 135 mmol/L (ref 135–145)

## 2021-12-11 LAB — HEMOGLOBIN AND HEMATOCRIT, BLOOD
HCT: 34.7 % — ABNORMAL LOW (ref 39.0–52.0)
Hemoglobin: 12.2 g/dL — ABNORMAL LOW (ref 13.0–17.0)

## 2021-12-11 SURGERY — ESOPHAGOGASTRODUODENOSCOPY (EGD) WITH PROPOFOL
Anesthesia: Monitor Anesthesia Care

## 2021-12-11 MED ORDER — LACTATED RINGERS IV SOLN
INTRAVENOUS | Status: AC | PRN
  Administered 2021-12-11: 20 mL/h via INTRAVENOUS

## 2021-12-11 MED ORDER — LACTATED RINGERS IV SOLN: INTRAVENOUS | Status: DC | PRN

## 2021-12-11 MED ORDER — SODIUM CHLORIDE 0.9 % IV SOLN: INTRAVENOUS | Status: DC

## 2021-12-11 MED ORDER — PROPOFOL 10 MG/ML IV BOLUS
INTRAVENOUS | Status: DC | PRN
  Administered 2021-12-11: 20 mg via INTRAVENOUS

## 2021-12-11 MED ORDER — PROPOFOL 500 MG/50ML IV EMUL
INTRAVENOUS | Status: DC | PRN
  Administered 2021-12-11: 100 ug/kg/min via INTRAVENOUS

## 2021-12-11 MED ORDER — ASPIRIN 81 MG PO TABS
81.0000 mg | ORAL_TABLET | Freq: Every day | ORAL | Status: AC
Start: 2021-12-18 — End: ?

## 2021-12-11 SURGICAL SUPPLY — 15 items

## 2021-12-11 NOTE — Hospital Course (Signed)
[name] [name] is a 71 y.o. male with medical history significant of prostate cancer, BPH, hyperlipidemia, GERD, overactive bladder, hypertension. Patient presented secondary to blood noticed in stools with evidence of FOBT stool and concern for acute GI bleeding. Hemoglobin stable. CTA without identification of active bleed. GI performed upper endoscopy without identification of active bleeding. Likely diverticular bleeding from known diverticulosis. No NSAIDs.

## 2021-12-11 NOTE — Discharge Instructions (Addendum)
[name] [name],  You were in the hospital because of bleeding from your rectum. There was concern for bleeding in your GI system. The GI physician was consulted. You had a CT scan without evidence of active bleeding. You then had an upper endoscopy which found some inflammation of your stomach but no overt bleeding source. The GI physician recommends you hold your aspirin for 1 week. I have also discontinued your hydralazine and hydrochlorothiazide because of your labile blood pressure. Please follow-up with your PCP and primary gastroenterologist.    Keep a log of your blood pressures and bring it to your PCP with follow up. If you notice your blood pressures continuing to be high contact your PCP for instruction regarding medication and follow up visit. If you don't feel well, take blood pressure and check heart rate and follow up with PCP.

## 2021-12-11 NOTE — Assessment & Plan Note (Signed)
Agree with half BP med for now, and follow due to labile recent

## 2021-12-11 NOTE — Anesthesia Preprocedure Evaluation (Addendum)
Anesthesia Evaluation  Patient identified by MRN, date of birth, ID band Patient awake    Reviewed: Allergy & Precautions, NPO status , Patient's Chart, lab work & pertinent test results  Airway Mallampati: III  TM Distance: >3 FB Neck ROM: Full    Dental  (+) Teeth Intact, Dental Advisory Given   Pulmonary asthma ,    Pulmonary exam normal breath sounds clear to auscultation       Cardiovascular hypertension, Pt. on medications Normal cardiovascular exam Rhythm:Regular Rate:Normal     Neuro/Psych  Headaches, PSYCHIATRIC DISORDERS Anxiety    GI/Hepatic Neg liver ROS, GERD  Medicated,GIB    Endo/Other  negative endocrine ROSObesity   Renal/GU negative Renal ROS Bladder dysfunction  Prostate cancer     Musculoskeletal negative musculoskeletal ROS (+)   Abdominal   Peds  Hematology  (+) Blood dyscrasia, anemia ,   Anesthesia Other Findings Day of surgery medications reviewed with the patient.  Reproductive/Obstetrics                            Anesthesia Physical Anesthesia Plan  ASA: 3  Anesthesia Plan: MAC   Post-op Pain Management:    Induction: Intravenous  PONV Risk Score and Plan: 1 and Treatment may vary due to age or medical condition and Propofol infusion  Airway Management Planned: Natural Airway and Nasal Cannula  Additional Equipment:   Intra-op Plan:   Post-operative Plan:   Informed Consent: I have reviewed the patients History and Physical, chart, labs and discussed the procedure including the risks, benefits and alternatives for the proposed anesthesia with the patient or authorized representative who has indicated his/her understanding and acceptance.     Dental advisory given  Plan Discussed with: CRNA  Anesthesia Plan Comments:         Anesthesia Quick Evaluation

## 2021-12-11 NOTE — Assessment & Plan Note (Signed)
Likely etiology for current presentation. Bleeding stopped and hemoglobin is stable.

## 2021-12-11 NOTE — Assessment & Plan Note (Signed)
Also for Protonix 40 mg qd

## 2021-12-11 NOTE — Interval H&P Note (Signed)
History and Physical Interval Note:  12/11/2021 8:59 AM  [name] [name]  has presented today for surgery, with the diagnosis of GI bleed.  The various methods of treatment have been discussed with the patient and family. After consideration of risks, benefits and other options for treatment, the patient has consented to  Procedure(s): ESOPHAGOGASTRODUODENOSCOPY (EGD) WITH PROPOFOL (N/A) as a surgical intervention.  The patient's history has been reviewed, patient examined, no change in status, stable for surgery.  I have reviewed the patient's chart and labs.  Questions were answered to the patient's satisfaction.     Jackquline Denmark

## 2021-12-11 NOTE — Transfer of Care (Signed)
Immediate Anesthesia Transfer of Care Note  Patient: [name] [name]  Procedure(s) Performed: ESOPHAGOGASTRODUODENOSCOPY (EGD) WITH PROPOFOL BIOPSY  Patient Location: PACU  Anesthesia Type:MAC  Level of Consciousness: awake and alert   Airway & Oxygen Therapy: Patient Spontanous Breathing  Post-op Assessment: Report given to RN and Post -op Vital signs reviewed and stable  Post vital signs: Reviewed and stable  Last Vitals:  Vitals Value Taken Time  BP 136/79   Temp    Pulse 73 12/11/21 0948  Resp 16 12/11/21 0948  SpO2 97 % 12/11/21 0948  Vitals shown include unvalidated device data.  Last Pain:  Vitals:   12/11/21 0824  TempSrc: Temporal  PainSc: 0-No pain         Complications: No notable events documented.

## 2021-12-11 NOTE — Assessment & Plan Note (Signed)
See problem, Primary hypertension.

## 2021-12-11 NOTE — Assessment & Plan Note (Signed)
Last vitamin D Lab Results  Component Value Date   VD25OH 45.37 10/17/2021   Low, reminded to start oral replacement'

## 2021-12-11 NOTE — Anesthesia Postprocedure Evaluation (Signed)
Anesthesia Post Note  Patient: [name] [name]  Procedure(s) Performed: ESOPHAGOGASTRODUODENOSCOPY (EGD) WITH PROPOFOL BIOPSY     Patient location during evaluation: Endoscopy Anesthesia Type: MAC Level of consciousness: oriented, awake and alert and awake Pain management: pain level controlled Vital Signs Assessment: post-procedure vital signs reviewed and stable Respiratory status: spontaneous breathing, nonlabored ventilation, respiratory function stable and patient connected to nasal cannula oxygen Cardiovascular status: blood pressure returned to baseline and stable Postop Assessment: no headache, no backache and no apparent nausea or vomiting Anesthetic complications: no   No notable events documented.  Last Vitals:  Vitals:   12/11/21 1000 12/11/21 1021  BP: (!) 159/94 (!) 164/86  Pulse: 69 66  Resp: 18 15  Temp:  36.9 C  SpO2: 96% 99%    Last Pain:  Vitals:   12/11/21 1021  TempSrc: Oral  PainSc:                  Santa Lighter

## 2021-12-11 NOTE — Discharge Summary (Signed)
Physician Discharge Summary   Patient: [name] [name] MRN: [id number] DOB: [birth year]  Admit date:     12/10/2021  Discharge date: 12/11/21  Discharge Physician: Cordelia Poche, MD   PCP: Biagio Borg, MD   Recommendations at discharge:  PCP follow-up in 1 week Resume aspirin in 1 week Follow BP closely GI follow-up in 3-4 weeks  Discharge Diagnoses: Principal Problem:   GI bleed Active Problems:   Hypercholesterolemia   GERD   FREQUENCY, URINARY   BPH (benign prostatic hyperplasia)   Primary hypertension   Acute blood loss anemia  Resolved Problems:   * No resolved hospital problems. *  Hospital Course: [name] [name] is a 71 y.o. male with medical history significant of prostate cancer, BPH, hyperlipidemia, GERD, overactive bladder, hypertension. Patient presented secondary to blood noticed in stools with evidence of FOBT stool and concern for acute GI bleeding. Hemoglobin stable. CTA without identification of active bleed. GI performed upper endoscopy without identification of active bleeding. Likely diverticular bleeding from known diverticulosis. No NSAIDs.  Assessment and Plan: * GI bleed Uncertain etiology. No history of melena reported. History of diverticulosis on most recent colonoscopy. Hemoglobin stable. [facility] consulted on admission. CTA abdomen/pelvis significant for possible vascular malformation of the gastric antrum and diverticulosis without diverticulitis. Upper GI endoscopy performed on 8/6 revealing mild gastritis and a submucosal nodule of the duodenum without evidence of bleeding. Per GI, likely diverticular bleed. GI recommendations for no NSAIDS, resume aspirin in 1 week, GI follow-up in 3-4 weeks.  Diverticulosis of intestine with bleeding Likely etiology for current presentation. Bleeding stopped and hemoglobin is stable.  Hypotension See problem, Primary hypertension.  Acute blood loss anemia Secondary to GI bleeding. Baseline hemoglobin of 15.  Hemoglobin of 12.3 > 12.5 on admission. Mild anemia. Hemoglobin stable prior to discharge.  Primary hypertension Patient is on benazepril, hydrochlorothiazide and hydralazine as an outpatient. Patient has held his antihypertensives secondary to active GI bleeding and PCP's concern for possible resultant hypotension. Blood pressure uncontrolled but patient is asymptomatic.Patient resumed on home medications in the ED with resultant hypotension requiring IV fluids for resuscitation. Continue benazepril on discharge. Discontinue hydrochlorothiazide and hydralazine. PCP follow-up for continued titration of antihypertensives.  BPH (benign prostatic hyperplasia) Continue Flomax  FREQUENCY, URINARY Continue Gemtesa  GERD Continue Protonix  Hypercholesterolemia Continue Lipitor   Consultants: [facility] Procedures performed: Upper endoscopy  Disposition: Home Diet recommendation: Cardiac diet   DISCHARGE MEDICATION: Allergies as of 12/11/2021       Reactions   Beta Adrenergic Blockers Other (See Comments)   bradycardia   Celebrex [celecoxib] Other (See Comments)   Stroke like symptoms   Crestor [rosuvastatin] Other (See Comments)   weakness   Detrol [tolterodine] Other (See Comments)   headache   Ditropan [oxybutynin] Other (See Comments)   Dry mouth   Hydralazine Other (See Comments)   Pt seems abnormally sensitive to med.  Got just '50mg'$  PO, dropped SBP from 180 to 73 with paradoxical sinus bradycardia in low 40s. If going to try to use in future, consider dramatically reduced dose.   Norvasc [amlodipine Besylate] Swelling   Tadalafil Other (See Comments)   headache   Toprol Xl [metoprolol] Other (See Comments)   dizzy        Medication List     STOP taking these medications    aspirin-acetaminophen-caffeine 250-250-65 MG tablet Commonly known as: EXCEDRIN MIGRAINE   hydrALAZINE 50 MG tablet Commonly known as: APRESOLINE   hydrochlorothiazide 12.5 MG  capsule  Commonly known as: MICROZIDE       TAKE these medications    aspirin 81 MG tablet Take 1 tablet (81 mg total) by mouth daily. Start taking on: December 18, 2021 What changed: These instructions start on December 18, 2021. If you are unsure what to do until then, ask your doctor or other care provider.   atorvastatin 40 MG tablet Commonly known as: LIPITOR Take 1 tablet (40 mg total) by mouth daily.   benazepril 40 MG tablet Commonly known as: LOTENSIN Take 1 tablet (40 mg total) by mouth daily.   Cholecalciferol 50 MCG (2000 UT) Tabs 1 tab by mouth once daily What changed:  how much to take how to take this when to take this additional instructions   dorzolamide-timolol 22.3-6.8 MG/ML ophthalmic solution Commonly known as: COSOPT Place 1 drop into both eyes 2 (two) times daily.   Gemtesa 75 MG Tabs Generic drug: Vibegron Take 75 mg by mouth daily. SAMPLES   latanoprost 0.005 % ophthalmic solution Commonly known as: XALATAN Place 1 drop into both eyes at bedtime.   pantoprazole 40 MG tablet Commonly known as: PROTONIX Take 1 tablet (40 mg total) by mouth daily.   polyethylene glycol 17 g packet Commonly known as: MIRALAX / GLYCOLAX Take 17 g by mouth as needed for mild constipation.   potassium chloride 10 MEQ tablet Commonly known as: Klor-Con 10 Take 1 tablet (10 mEq total) by mouth daily.   sildenafil 20 MG tablet Commonly known as: REVATIO TAKE THREE TABLETS BY MOUTH AS NEEDED What changed:  how much to take how to take this when to take this reasons to take this additional instructions   Systane Ultra 0.4-0.3 % Soln Generic drug: Polyethyl Glycol-Propyl Glycol Place 1 drop into both eyes daily as needed (dryness).   tamsulosin 0.4 MG Caps capsule Commonly known as: FLOMAX TAKE 1 CAPSULE BY MOUTH EVERY DAY        Follow-up Information     Biagio Borg, MD. Schedule an appointment as soon as possible for a visit in 1 week(s).    Specialties: Internal Medicine, Radiology Why: For hospital follow-up Contact information: [place] [address] [phone]         Irene Shipper, MD Follow up in 43 week(s).   Specialty: Gastroenterology Why: For hospital follow-up Contact information: [address] [address] [phone]                Discharge Exam: BP (!) 164/86 (BP Location: Left Arm)   Pulse 66   Temp 98.4 F (36.9 C) (Oral)   Resp 15   Ht '5\' 7"'$  (1.702 m)   Wt 88.9 kg   SpO2 99%   BMI 30.70 kg/m   General exam: Appears calm and comfortable Respiratory system: Clear to auscultation. Respiratory effort normal. Cardiovascular system: S1 & S2 heard, RRR. No murmurs, rubs, gallops or clicks. Gastrointestinal system: Abdomen is nondistended, soft and nontender. Normal bowel sounds heard. Central nervous system: Alert and oriented. No focal neurological deficits. Musculoskeletal: No edema. No calf tenderness Skin: No cyanosis. No rashes Psychiatry: Judgement and insight appear normal. Mood & affect appropriate.   Condition at discharge: stable  The results of significant diagnostics from this hospitalization (including imaging, microbiology, ancillary and laboratory) are listed below for reference.   Imaging Studies: CT Angio Abd/Pel W and/or Wo Contrast  Result Date: 12/10/2021 CLINICAL DATA:  GI bleed, lower Diverticular bleed EXAM: CTA ABDOMEN AND PELVIS WITHOUT  AND WITH CONTRAST TECHNIQUE: Multidetector CT imaging of the abdomen and pelvis was performed using the standard protocol during bolus administration of intravenous contrast. Multiplanar reconstructed images and MIPs were obtained and reviewed to evaluate the vascular anatomy. RADIATION DOSE REDUCTION: This exam was performed according to the departmental dose-optimization program which includes automated exposure control, adjustment of the mA and/or kV according to patient size and/or use of  iterative reconstruction technique. CONTRAST:  20m OMNIPAQUE IOHEXOL 350 MG/ML SOLN COMPARISON:  CT dated October 28, 2021. July 17, 2012 FINDINGS: VASCULAR Aorta: Minimal atherosclerotic calcifications throughout the nonaneurysmal abdominal aorta. Celiac: Patent without evidence of aneurysm, dissection, vasculitis or significant stenosis. SMA: Patent without evidence of aneurysm, dissection, vasculitis or significant stenosis. Renals: There is 1 RIGHT renal artery and 2 LEFT renal arteries. They are patent. IMA: Patent. Inflow: Patent.  Minimal atherosclerotic calcifications. Proximal Outflow: Bilateral common femoral and visualized portions of the superficial and profunda femoral arteries are patent without evidence of aneurysm, dissection, vasculitis or significant stenosis. Veins: No obvious venous abnormality. Review of the MIP images confirms the above findings. NON-VASCULAR Lower chest: No acute abnormality. Hepatobiliary: Cholelithiasis. No evidence of acute cholecystitis. Portal vein is patent. No suspicious focal hepatic lesion. No biliary ductal dilation. Pancreas: Unremarkable. No pancreatic ductal dilatation or surrounding inflammatory changes. Spleen: Normal in size without focal abnormality. Adrenals/Urinary Tract: Adrenal glands are stable in appearance. No hydronephrosis. Kidneys enhance symmetrically. Bilateral renal cysts, the largest of which measures 4.3 cm in the superior pole of the LEFT kidney (for which no dedicated imaging follow-up is recommended) . Subcentimeter hypodense lesions are too small to accurately characterize. No obstructing nephrolithiasis. Bladder is unremarkable. Stomach/Bowel: There is a focal blush of arterial enhancement within the submucosal region of the gastric antrum. No suggestion of active contrast extravasation. This likely reflects a vascular malformation (series 9, image 48; series 11, image 50). No CT evidence of active contrast extravasation. Diverticulosis  without evidence of acute diverticulitis. Appendix is stable and mildly prominent without evidence of acute inflammation. No evidence of bowel obstruction. Lymphatic: No suspicious lymphadenopathy identified. Reproductive: Metallic densities within the prostate. Other: No free air or free fluid. Musculoskeletal: Similar appearance of lucent lesions with sclerotic margins involving L1, L2 and LEFT T12 extending into the LEFT twelfth rib since 2014, most consistent with a benign etiology. Similar bone island of T12. IMPRESSION: VASCULAR No evidence of active GI bleed. There is enhancing area in the gastric antrum which may reflect a vascular malformation and a potential source of bleeding. Consider correlation with endoscopy. NON-VASCULAR Diverticulosis without evidence of acute diverticulitis. Electronically Signed   By: SValentino SaxonM.D.   On: 12/10/2021 17:38    Microbiology: Results for orders placed or performed in visit on 05/15/19  Urine culture     Status: None   Collection Time: 05/15/19  3:53 PM   Specimen: Urine  Result Value Ref Range Status   Source: NOT GIVEN  Final   Status: FINAL  Final   Result: No Growth  Final    Labs: CBC: Recent Labs  Lab 12/08/21 1625 12/10/21 1030 12/10/21 1432 12/10/21 2304 12/11/21 0319 12/11/21 1031  WBC 8.8 8.0 9.2  --  12.2*  --   NEUTROABS 6.3  --   --   --   --   --   HGB 11.9* 12.3* 12.5* 11.0* 11.5* 12.2*  HCT 35.8* 36.6* 36.4* 32.6* 33.2* 34.7*  MCV 94.5 93.8 93.1  --  92.2  --   PLT 256.0  295 286  --  260  --    Basic Metabolic Panel: Recent Labs  Lab 12/08/21 1625 12/10/21 1030 12/11/21 0319  NA 142 139 135  K 4.0 4.0 3.7  CL 108 105 102  CO2 '29 28 26  '$ GLUCOSE 111* 119* 118*  BUN '14 10 11  '$ CREATININE 1.14 1.19 1.21  CALCIUM 9.3 9.2 8.8*   Liver Function Tests: Recent Labs  Lab 12/08/21 1625 12/10/21 1030  AST 16 21  ALT 14 18  ALKPHOS 49 52  BILITOT 0.6 0.9  PROT 7.1 7.3  ALBUMIN 4.2 4.0   Discharge time  spent: 35 minutes.  Signed: Cordelia Poche, MD [facility] 12/11/2021

## 2021-12-11 NOTE — Assessment & Plan Note (Signed)
Small volume recurrent painless without fever except for one larger volume 4 days ago, none in last 2 days, does not want to go to ED today, now for d/c asa, start protonix 40 qd, check cbc, and refer Dr Henrene Pastor GI asap

## 2021-12-11 NOTE — Op Note (Addendum)
[facility] Patient Name: [name] [name] Procedure Date : 12/11/2021 MRN: [id number] Attending MD: Jackquline Denmark , MD Date of Birth: [birth year] CSN: [id number] Age: 71 Admit Type: Inpatient Procedure:                Upper GI endoscopy Indications:              Painless hematochezia, likely diverticular bleed.                            Neg CTA for bleeding but incidental enhancing area                            in antrum ? Vascular malformation. Providers:                Jackquline Denmark, MD, Grace Isaac, RN, Benetta Spar, Technician Referring MD:              Medicines:                Monitored Anesthesia Care Complications:            No immediate complications. Estimated Blood Loss:     Estimated blood loss: none. Procedure:                Pre-Anesthesia Assessment:                           - Prior to the procedure, a History and Physical                            was performed, and patient medications and                            allergies were reviewed. The patient's tolerance of                            previous anesthesia was also reviewed. The risks                            and benefits of the procedure and the sedation                            options and risks were discussed with the patient.                            All questions were answered, and informed consent                            was obtained. Prior Anticoagulants: The patient has                            taken no previous anticoagulant or antiplatelet  agents. ASA Grade Assessment: II - A patient with                            mild systemic disease. After reviewing the risks                            and benefits, the patient was deemed in                            satisfactory condition to undergo the procedure.                           After obtaining informed consent, the endoscope was                            passed under  direct vision. Throughout the                            procedure, the patient's blood pressure, pulse, and                            oxygen saturations were monitored continuously. The                            [id number]-[id number] ([id number]) Olympus endoscope was introduced                            through the mouth, and advanced to the second part                            of duodenum. The upper GI endoscopy was                            accomplished without difficulty. The patient                            tolerated the procedure well. Scope In: Scope Out: Findings:      The examined esophagus was normal with well-defined Z-line at 40 cm.      Scattered minimal inflammation characterized by erythema was found in       the stomach. Biopsies were taken with a cold forceps for histology.      A single 10 mm submucosal nodule with a localized distribution was found       in the duodenal bulb. Pictures taken but lost due to technical reasons.       Not biopsied since it could be a vascular malformation.      The exam was otherwise without abnormality. Impression:               - Mild gastritis                           - Submucosal nodule found in the duodenum.                           -  The examination was otherwise normal. No UGI                            bleeding. Recommendation:           - Return patient to hospital ward for ongoing care.                           - Resume previous diet.                           - Protonix 40 mg p.o. daily.                           - Avoid nonsteroidals.                           - Patient likely had a diverticular bleed which has                            resolved (given Nl BUN).He wants to go home. The                            EGD/CTA findings may be incidental. If any further                            eval is needed, would recommend EUS or mesenteric                            angiography                           - FU GI clinic in 3-4 weeks  with Dr Henrene Pastor.                           - The findings and recommendations were discussed                            with the patient's family. Procedure Code(s):        --- Professional ---                           [phone], Esophagogastroduodenoscopy, flexible,                            transoral; with biopsy, single or multiple Diagnosis Code(s):        --- Professional ---                           K29.70, Gastritis, unspecified, without bleeding                           K31.89, Other diseases of stomach and duodenum                           K92.1, Melena (includes Hematochezia) CPT copyright 2019 American  Medical Association. All rights reserved. The codes documented in this report are preliminary and upon coder review may  be revised to meet current compliance requirements. Jackquline Denmark, MD 12/11/2021 9:55:57 AM This report has been signed electronically. Number of Addenda: 0

## 2021-12-13 ENCOUNTER — Encounter (HOSPITAL_COMMUNITY): Payer: Self-pay | Admitting: Gastroenterology

## 2021-12-13 LAB — SURGICAL PATHOLOGY

## 2021-12-16 ENCOUNTER — Encounter: Payer: Self-pay | Admitting: Gastroenterology

## 2021-12-16 DIAGNOSIS — H35373 Puckering of macula, bilateral: Secondary | ICD-10-CM | POA: Diagnosis not present

## 2021-12-16 DIAGNOSIS — H02834 Dermatochalasis of left upper eyelid: Secondary | ICD-10-CM | POA: Diagnosis not present

## 2021-12-16 DIAGNOSIS — H0102B Squamous blepharitis left eye, upper and lower eyelids: Secondary | ICD-10-CM | POA: Diagnosis not present

## 2021-12-16 DIAGNOSIS — H26491 Other secondary cataract, right eye: Secondary | ICD-10-CM | POA: Diagnosis not present

## 2021-12-16 DIAGNOSIS — H401131 Primary open-angle glaucoma, bilateral, mild stage: Secondary | ICD-10-CM | POA: Diagnosis not present

## 2021-12-16 DIAGNOSIS — H02831 Dermatochalasis of right upper eyelid: Secondary | ICD-10-CM | POA: Diagnosis not present

## 2021-12-16 DIAGNOSIS — Z961 Presence of intraocular lens: Secondary | ICD-10-CM | POA: Diagnosis not present

## 2021-12-16 DIAGNOSIS — H35352 Cystoid macular degeneration, left eye: Secondary | ICD-10-CM | POA: Diagnosis not present

## 2021-12-16 DIAGNOSIS — H0102A Squamous blepharitis right eye, upper and lower eyelids: Secondary | ICD-10-CM | POA: Diagnosis not present

## 2021-12-16 NOTE — Progress Notes (Signed)
Please inform the patient. can you let the pt know that gastric bx are + for H pylori. Would recommend the following treatment regimen for 14 days: Amoxicillin 1gm BID Clarithromycin '500mg'$  BID Flagyl '500mg'$  BID Protonix 40 BID   Can you please order this if no signfiicant interactions noted. Once done with therapy, the patient should wait one month and perform a stool study for H pylori antigen to ensure negative. The PPI needs to be held at least 2 weeks prior to submitting the stool sample. The patient should avoid alcohol while taking Flagyl.   Thanks  Dr Lyndel Safe   Send report to family physician

## 2021-12-19 ENCOUNTER — Other Ambulatory Visit: Payer: Self-pay

## 2021-12-19 DIAGNOSIS — B9681 Helicobacter pylori [H. pylori] as the cause of diseases classified elsewhere: Secondary | ICD-10-CM

## 2021-12-19 MED ORDER — CLARITHROMYCIN 500 MG PO TABS: 500.0000 mg | ORAL_TABLET | Freq: Two times a day (BID) | ORAL | 0 refills | Status: AC

## 2021-12-19 MED ORDER — METRONIDAZOLE 500 MG PO TABS: 500.0000 mg | ORAL_TABLET | Freq: Two times a day (BID) | ORAL | 0 refills | Status: AC

## 2021-12-19 MED ORDER — CLARITHROMYCIN 500 MG PO TABS: 500.0000 mg | ORAL_TABLET | Freq: Two times a day (BID) | ORAL | 0 refills | Status: DC

## 2021-12-19 MED ORDER — AMOXICILLIN 500 MG PO CAPS: 1000.0000 mg | ORAL_CAPSULE | Freq: Two times a day (BID) | ORAL | 0 refills | Status: AC

## 2021-12-19 MED ORDER — PANTOPRAZOLE SODIUM 40 MG PO TBEC: 40.0000 mg | DELAYED_RELEASE_TABLET | Freq: Two times a day (BID) | ORAL | 0 refills | Status: DC

## 2021-12-19 NOTE — Progress Notes (Unsigned)
12/20/2021 [name] [name] [id number] 04-17-1951  Referring provider: Biagio Borg, MD Primary GI doctor: Dr. Henrene Pastor  ASSESSMENT AND PLAN:   Diverticulosis of intestine with bleeding, unspecified intestinal tract location Will call if any symptoms. Add on fiber supplement, avoid NSAIDS, information given  Gastrointestinal hemorrhage, unspecified gastrointestinal hemorrhage type CTA negative, recent normal colon 2019, likely diverticular Add on fiber Has labs this Thursday with PCP, will get repeat CBC at that time  Helicobacter positive gastritis Once done with therapy, the patient should wait 4-6 weeks and perform a stool study for H pylori antigen to ensure negative.  The PPI ( pantoprazole)  needs to be held at least 2 weeks prior to submitting the stool sample.   Suggest getting family members checked in same household especially if they have reflux, AB pain, nausea.    History of Present Illness:  71 y.o. male  with a past medical history of hypertension, hyperlipidemia, GERD and diverticulosis and others listed below, returns to clinic today for evaluation of lower GI bleed.  Patient was in the hospital from 12/10/21 to 12/11/21, was in the hospital for GI bleed. Patient complaining of hematochezia, suspected diverticular bleed.  Also had 2 large dark red blood per rectum in the ED. CTA unremarkable for active GI bleed enhancing area gastric antrum could reflect flecked vascular malformation or potential source of bleeding so proceeded with endoscopic evaluation. 12/11/2021 upper endoscopy for abnormal CTA with enhanced gastric antrum region, showed gastritis.  Pathology showed positive H. pylori gastritis  12/19/2021 medications for H. pylori sent in: Amoxicillin 1 g twice daily, clarithromycin 500 mg twice daily, Flagyl 500 mg twice daily and Protonix 40 mg twice daily for 14 days  Patient has some GERD but not that often.  Denies nausea and vomiting.  He is not on fiber  supplement.  Has occ constipation, no diarrhea. No rectal pain. No blood in stool.  No fever, chills, AB pain.  No SOB, no weakness, no chest pain.   CBC  12/11/2021  HGB 12.2 MCV 92.2  WBC 12.2 Platelets 260.  07/20/2021  B12 468 Kidney function 12/11/2021  BUN 11 Cr 1.21  GFR >60  Potassium 3.7   LFTs 12/10/2021  AST 21 ALT 18 Alkphos 52 TBili 0.9 07/20/2021 TSH 1.45   Colonoscopy 12/19/2017: - Diverticulosis in the left colon and in the right colon. - The examination was otherwise normal on direct and retroflexion views. - No specimens collected.   Colonoscopy 10/22/2007: Diverticulosis ascending colon to sigmoid colon   ECHO 12/19/2017: 1. Left ventricular ejection fraction, by visual estimation, is 60 to 65%. The left ventricle has normal function. Left ventricular septal wall thickness was normal. Normal left ventricular posterior wall thickness. There is no left ventricular hypertrophy. 2. Left ventricular diastolic Doppler parameters are consistent with pseudonormalization pattern of LV diastolic filling. 3. GLS = -19.0%. 4. Global right ventricle has normal systolic function.The right ventricular size is normal. No increase in right ventricular wall thickness. 5. Left atrial size was normal. 6. Right atrial size was normal. 7. The mitral valve is normal in structure. Trace mitral valve regurgitation. 8. The tricuspid valve is normal in structure. Tricuspid valve regurgitation was not visualized by color flow Doppler. 9. The aortic valve The aortic valve is tricuspid Aortic valve regurgitation was not visualized by color flow Doppler. Mild aortic valve sclerosis without stenosis. 10. The pulmonic valve was normal in structure. Pulmonic valve regurgitation is not visualized by color flow Doppler. 11. The  atrial septum is grossly normal.  Current Medications:    Current Outpatient Medications (Cardiovascular):    atorvastatin (LIPITOR) 40 MG tablet, Take 1 tablet (40 mg  total) by mouth daily.   benazepril (LOTENSIN) 40 MG tablet, Take 1 tablet (40 mg total) by mouth daily.   sildenafil (REVATIO) 20 MG tablet, TAKE THREE TABLETS BY MOUTH AS NEEDED (Patient taking differently: Take 60 mg by mouth as needed (erectile dysfunction).)   Current Outpatient Medications (Analgesics):    aspirin 81 MG tablet, Take 1 tablet (81 mg total) by mouth daily.   Current Outpatient Medications (Other):    amoxicillin (AMOXIL) 500 MG capsule, Take 2 capsules (1,000 mg total) by mouth 2 (two) times daily for 14 days.   Cholecalciferol 50 MCG (2000 UT) TABS, 1 tab by mouth once daily (Patient taking differently: Take 2,000 Units by mouth daily.)   clarithromycin (BIAXIN) 500 MG tablet, Take 1 tablet (500 mg total) by mouth 2 (two) times daily for 14 days.   dorzolamide-timolol (COSOPT) 22.3-6.8 MG/ML ophthalmic solution, Place 1 drop into both eyes 2 (two) times daily.   GEMTESA 75 MG TABS, Take 75 mg by mouth daily. SAMPLES   latanoprost (XALATAN) 0.005 % ophthalmic solution, Place 1 drop into both eyes at bedtime.   metroNIDAZOLE (FLAGYL) 500 MG tablet, Take 1 tablet (500 mg total) by mouth 2 (two) times daily for 14 days.   pantoprazole (PROTONIX) 40 MG tablet, Take 1 tablet (40 mg total) by mouth daily.   pantoprazole (PROTONIX) 40 MG tablet, Take 1 tablet (40 mg total) by mouth 2 (two) times daily for 14 days.   polyethylene glycol (MIRALAX / GLYCOLAX) packet, Take 17 g by mouth as needed for mild constipation.   potassium chloride (KLOR-CON 10) 10 MEQ tablet, Take 1 tablet (10 mEq total) by mouth daily.   SYSTANE ULTRA 0.4-0.3 % SOLN, Place 1 drop into both eyes daily as needed (dryness).   tamsulosin (FLOMAX) 0.4 MG CAPS capsule, TAKE 1 CAPSULE BY MOUTH EVERY DAY (Patient taking differently: Take 0.4 mg by mouth daily.)  Surgical History:  He  has a past surgical history that includes Colonoscopy (2009); Esophagogastroduodenoscopy (egd) with propofol (N/A, 12/11/2021); and  biopsy (12/11/2021). Family History:  His family history includes Heart disease (age of onset: 63) in his brother; Lung cancer (age of onset: 60) in his father. Social History:   reports that he has never smoked. He has never used smokeless tobacco. He reports that he does not drink alcohol and does not use drugs.  Current Medications, Allergies, Past Medical History, Past Surgical History, Family History and Social History were reviewed in [employer] record.  Physical Exam: BP 130/88   Pulse 70   Ht '5\' 7"'$  (1.702 m)   Wt 192 lb (87.1 kg)   BMI 30.07 kg/m  General:   Pleasant, well developed male in no acute distress Heart : Regular rate and rhythm; no murmurs Pulm: Clear anteriorly; no wheezing Abdomen:  Soft, Obese AB, Active bowel sounds. No tenderness . , No organomegaly appreciated. Rectal: Not evaluated Extremities:  without  edema. Neurologic:  Alert and  oriented x4;  No focal deficits.  Psych:  Cooperative. Normal mood and affect.   Vladimir Crofts, PA-C 12/20/21

## 2021-12-20 ENCOUNTER — Ambulatory Visit (INDEPENDENT_AMBULATORY_CARE_PROVIDER_SITE_OTHER): Payer: Medicare Other | Admitting: Physician Assistant

## 2021-12-20 ENCOUNTER — Encounter: Payer: Self-pay | Admitting: Physician Assistant

## 2021-12-20 VITALS — BP 130/88 | HR 70 | Ht 67.0 in | Wt 192.0 lb

## 2021-12-20 DIAGNOSIS — K2970 Gastritis, unspecified, without bleeding: Secondary | ICD-10-CM | POA: Diagnosis not present

## 2021-12-20 DIAGNOSIS — B9681 Helicobacter pylori [H. pylori] as the cause of diseases classified elsewhere: Secondary | ICD-10-CM

## 2021-12-20 DIAGNOSIS — K5791 Diverticulosis of intestine, part unspecified, without perforation or abscess with bleeding: Secondary | ICD-10-CM

## 2021-12-20 DIAGNOSIS — K922 Gastrointestinal hemorrhage, unspecified: Secondary | ICD-10-CM | POA: Diagnosis not present

## 2021-12-20 NOTE — Patient Instructions (Addendum)
  Once done with therapy, the patient should wait 4-6 weeks and perform a stool study for H pylori antigen to ensure negative.  The PPI ( pantoprazole)  needs to be held at least 2 weeks prior to submitting the stool sample.   The patient should avoid alcohol while taking Flagyl.  Suggest getting family members checked if they have reflux, AB pain, nausea.   Diverticulosis Diverticulosis is a condition that develops when small pouches (diverticula) form in the wall of the large intestine (colon). The colon is where water is absorbed and stool (feces) is formed. The pouches form when the inside layer of the colon pushes through weak spots in the outer layers of the colon. You may have a few pouches or many of them. The pouches usually do not cause problems unless they become inflamed or infected. When this happens, the condition is called diverticulitis- this is left lower quadrant pain, diarrhea, fever, chills, nausea or vomiting.  If this occurs please call the office or go to the hospital. Sometimes these patches without inflammation can also have painless bleeding associated with them, if this happens please call the office or go to the hospital. Preventing constipation and increasing fiber can help reduce diverticula and prevent complications. Even if you feel you have a high-fiber diet, suggest getting on Benefiber or Cirtracel 2 times daily. Can occ add miralax 1/2-1 capful daily, takes 3-4 days to start working  Recommend starting on a fiber supplement, can try metamucil first but if this causes gas/bloating switch to benefiber or citracel, these do not cause gas.  Take with fiber with with a full 8 oz glass of water once a day. This can take 1 month to start helping, so try for at least one month.  Recommend increasing water and physical activity.   - Drink at least 64-80 ounces of water/liquid per day. - Establish a time to try to move your bowels every day.  For many people, this is after  a cup of coffee or after a meal such as breakfast. - Sit all of the way back on the toilet keeping your back fairly straight and while sitting up, try to rest the tops of your forearms on your upper thighs.   - Raising your feet with a step stool/squatty potty can be helpful to improve the angle that allows your stool to pass through the rectum. - Relax the rectum feeling it bulge toward the toilet water.  If you feel your rectum raising toward your body, you are contracting rather than relaxing. - Breathe in and slowly exhale. "Belly breath" by expanding your belly towards your belly button. Keep belly expanded as you gently direct pressure down and back to the anus.  A low pitched GRRR sound can assist with increasing intra-abdominal pressure.  - Repeat 3-4 times. If unsuccessful, contract the pelvic floor to restore normal tone and get off the toilet.  Avoid excessive straining. - To reduce excessive wiping by teaching your anus to normally contract, place hands on outer aspect of knees and resist knee movement outward.  Hold 5-10 second then place hands just inside of knees and resist inward movement of knees.  Hold 5 seconds.  Repeat a few times each way.  Go to the ER if unable to pass gas, severe AB pain, unable to hold down food, any shortness of breath of chest pain.

## 2021-12-20 NOTE — Progress Notes (Signed)
Assessment and plan noted ?

## 2021-12-22 ENCOUNTER — Ambulatory Visit (INDEPENDENT_AMBULATORY_CARE_PROVIDER_SITE_OTHER): Payer: Medicare Other | Admitting: Internal Medicine

## 2021-12-22 ENCOUNTER — Encounter: Payer: Self-pay | Admitting: Internal Medicine

## 2021-12-22 VITALS — BP 138/80 | HR 83 | Temp 98.1°F | Ht 67.0 in | Wt 192.4 lb

## 2021-12-22 DIAGNOSIS — E559 Vitamin D deficiency, unspecified: Secondary | ICD-10-CM

## 2021-12-22 DIAGNOSIS — R739 Hyperglycemia, unspecified: Secondary | ICD-10-CM

## 2021-12-22 DIAGNOSIS — K922 Gastrointestinal hemorrhage, unspecified: Secondary | ICD-10-CM | POA: Diagnosis not present

## 2021-12-22 DIAGNOSIS — R0989 Other specified symptoms and signs involving the circulatory and respiratory systems: Secondary | ICD-10-CM | POA: Diagnosis not present

## 2021-12-22 LAB — CBC WITH DIFFERENTIAL/PLATELET
Basophils Absolute: 0 10*3/uL (ref 0.0–0.1)
Basophils Relative: 0.4 % (ref 0.0–3.0)
Eosinophils Absolute: 0 10*3/uL (ref 0.0–0.7)
Eosinophils Relative: 0.5 % (ref 0.0–5.0)
HCT: 35.6 % — ABNORMAL LOW (ref 39.0–52.0)
Hemoglobin: 11.9 g/dL — ABNORMAL LOW (ref 13.0–17.0)
Lymphocytes Relative: 24.1 % (ref 12.0–46.0)
Lymphs Abs: 1.4 10*3/uL (ref 0.7–4.0)
MCHC: 33.5 g/dL (ref 30.0–36.0)
MCV: 94.6 fl (ref 78.0–100.0)
Monocytes Absolute: 0.5 10*3/uL (ref 0.1–1.0)
Monocytes Relative: 8.1 % (ref 3.0–12.0)
Neutro Abs: 4 10*3/uL (ref 1.4–7.7)
Neutrophils Relative %: 66.9 % (ref 43.0–77.0)
Platelets: 314 10*3/uL (ref 150.0–400.0)
RBC: 3.76 Mil/uL — ABNORMAL LOW (ref 4.22–5.81)
RDW: 14.6 % (ref 11.5–15.5)
WBC: 6 10*3/uL (ref 4.0–10.5)

## 2021-12-22 LAB — BASIC METABOLIC PANEL
BUN: 14 mg/dL (ref 6–23)
CO2: 27 mEq/L (ref 19–32)
Calcium: 9.3 mg/dL (ref 8.4–10.5)
Chloride: 103 mEq/L (ref 96–112)
Creatinine, Ser: 1.23 mg/dL (ref 0.40–1.50)
GFR: 58.2 mL/min — ABNORMAL LOW (ref >60.00)
Glucose, Bld: 97 mg/dL (ref 70–99)
Potassium: 3.9 mEq/L (ref 3.5–5.1)
Sodium: 138 mEq/L (ref 135–145)

## 2021-12-22 NOTE — Progress Notes (Signed)
Patient ID: [name] [name], male   DOB: [birth year], 71 y.o.   MRN: [id number]        Chief Complaint: follow up recent lower GI bleeding, htn, low vit d, hyperglycemia       HPI:  [name] [name] is a 71 y.o. male here overall doing well.  No overt bleeding.  Denies worsening reflux, abd pain, dysphagia, n/v, bowel change or blood.  Pt denies chest pain, increased sob or doe, wheezing, orthopnea, PND, increased LE swelling, palpitations, dizziness or syncope.  BP has been trending up mild at home as well again but not taking ACE or HCT 12.5   Pt denies polydipsia, polyuria, or new focal neuro s/s.    Pt denies fever, wt loss, night sweats, loss of appetite, or other constitutional symptoms  Has seen GI aug 15 with new tx for H pylori.         Wt Readings from Last 3 Encounters:  12/22/21 192 lb 6.4 oz (87.3 kg)  12/20/21 192 lb (87.1 kg)  12/10/21 195 lb 15.8 oz (88.9 kg)   BP Readings from Last 3 Encounters:  12/22/21 138/80  12/20/21 130/88  12/11/21 (!) 164/86         Past Medical History:  Diagnosis Date   BENIGN PROSTATIC HYPERTROPHY 05/06/2010   COMMON MIGRAINE 07/31/2007   GERD 07/31/2007   HYPERLIPIDEMIA 01/04/2007   HYPERTENSION 01/04/2007   OVERACTIVE BLADDER 03/06/2007   Prostate cancer ([place]) 02/04/2015   Past Surgical History:  Procedure Laterality Date   BIOPSY  12/11/2021   Procedure: BIOPSY;  Surgeon: Jackquline Denmark, MD;  Location: [facility] ENDOSCOPY;  Service: Gastroenterology;;   COLONOSCOPY  2009   ESOPHAGOGASTRODUODENOSCOPY (EGD) WITH PROPOFOL N/A 12/11/2021   Procedure: ESOPHAGOGASTRODUODENOSCOPY (EGD) WITH PROPOFOL;  Surgeon: Jackquline Denmark, MD;  Location: [facility];  Service: Gastroenterology;  Laterality: N/A;    reports that he has never smoked. He has never used smokeless tobacco. He reports that he does not drink alcohol and does not use drugs. family history includes Heart disease (age of onset: 4) in his brother; Lung cancer (age of onset: 67) in his  father. Allergies  Allergen Reactions   Beta Adrenergic Blockers Other (See Comments)    bradycardia   Celebrex [Celecoxib] Other (See Comments)    Stroke like symptoms   Crestor [Rosuvastatin] Other (See Comments)    weakness   Detrol [Tolterodine] Other (See Comments)    headache   Ditropan [Oxybutynin] Other (See Comments)    Dry mouth   Hydralazine Other (See Comments)    Pt seems abnormally sensitive to med.  Got just '50mg'$  PO, dropped SBP from 180 to 73 with paradoxical sinus bradycardia in low 40s. If going to try to use in future, consider dramatically reduced dose.   Norvasc [Amlodipine Besylate] Swelling   Tadalafil Other (See Comments)    headache   Toprol Xl [Metoprolol] Other (See Comments)    dizzy   Current Outpatient Medications on File Prior to Visit  Medication Sig Dispense Refill   amoxicillin (AMOXIL) 500 MG capsule Take 2 capsules (1,000 mg total) by mouth 2 (two) times daily for 14 days. 56 capsule 0   aspirin 81 MG tablet Take 1 tablet (81 mg total) by mouth daily.     atorvastatin (LIPITOR) 40 MG tablet Take 1 tablet (40 mg total) by mouth daily. 90 tablet 3   benazepril (LOTENSIN) 40 MG tablet Take 1 tablet (40 mg total) by mouth daily. 90 tablet 3  Cholecalciferol 50 MCG (2000 UT) TABS 1 tab by mouth once daily (Patient taking differently: Take 2,000 Units by mouth daily.) 30 tablet 99   clarithromycin (BIAXIN) 500 MG tablet Take 1 tablet (500 mg total) by mouth 2 (two) times daily for 14 days. 28 tablet 0   dorzolamide-timolol (COSOPT) 22.3-6.8 MG/ML ophthalmic solution Place 1 drop into both eyes 2 (two) times daily.     GEMTESA 75 MG TABS Take 75 mg by mouth daily. SAMPLES     ketorolac (ACULAR) 0.5 % ophthalmic solution Place 1 drop into the left eye 4 (four) times daily.     latanoprost (XALATAN) 0.005 % ophthalmic solution Place 1 drop into both eyes at bedtime.  1   metroNIDAZOLE (FLAGYL) 500 MG tablet Take 1 tablet (500 mg total) by mouth 2 (two)  times daily for 14 days. 28 tablet 0   pantoprazole (PROTONIX) 40 MG tablet Take 1 tablet (40 mg total) by mouth daily. 30 tablet 3   pantoprazole (PROTONIX) 40 MG tablet Take 1 tablet (40 mg total) by mouth 2 (two) times daily for 14 days. 28 tablet 0   polyethylene glycol (MIRALAX / GLYCOLAX) packet Take 17 g by mouth as needed for mild constipation.     potassium chloride (KLOR-CON 10) 10 MEQ tablet Take 1 tablet (10 mEq total) by mouth daily. 90 tablet 3   sildenafil (REVATIO) 20 MG tablet TAKE THREE TABLETS BY MOUTH AS NEEDED (Patient taking differently: Take 60 mg by mouth as needed (erectile dysfunction).) 90 tablet 11   SYSTANE ULTRA 0.4-0.3 % SOLN Place 1 drop into both eyes daily as needed (dryness).     tamsulosin (FLOMAX) 0.4 MG CAPS capsule TAKE 1 CAPSULE BY MOUTH EVERY DAY (Patient taking differently: Take 0.4 mg by mouth daily.) 90 capsule 1   No current facility-administered medications on file prior to visit.        ROS:  All others reviewed and negative.  Objective        PE:  BP 138/80 (BP Location: Left Arm, Patient Position: Sitting, Cuff Size: Large)   Pulse 83   Temp 98.1 F (36.7 C) (Oral)   Ht '5\' 7"'$  (1.702 m)   Wt 192 lb 6.4 oz (87.3 kg)   SpO2 97%   BMI 30.13 kg/m                 Constitutional: Pt appears in NAD               HENT: Head: NCAT.                Right Ear: External ear normal.                 Left Ear: External ear normal.                Eyes: . Pupils are equal, round, and reactive to light. Conjunctivae and EOM are normal               Nose: without d/c or deformity               Neck: Neck supple. Gross normal ROM               Cardiovascular: Normal rate and regular rhythm.                 Pulmonary/Chest: Effort normal and breath sounds without rales or wheezing.  Abd:  Soft, NT, ND, + BS, no organomegaly               Neurological: Pt is alert. At baseline orientation, motor grossly intact               Skin: Skin is  warm. No rashes, no other new lesions, LE edema - none               Psychiatric: Pt behavior is normal without agitation   Micro: none  Cardiac tracings I have personally interpreted today:  none  Pertinent Radiological findings (summarize): none   Lab Results  Component Value Date   WBC 6.0 12/22/2021   HGB 11.9 (L) 12/22/2021   HCT 35.6 (L) 12/22/2021   PLT 314.0 12/22/2021   GLUCOSE 97 12/22/2021   CHOL 151 10/17/2021   TRIG 66.0 10/17/2021   HDL 57.40 10/17/2021   LDLDIRECT 139.2 10/29/2012   LDLCALC 80 10/17/2021   ALT 18 12/10/2021   AST 21 12/10/2021   NA 138 12/22/2021   K 3.9 12/22/2021   CL 103 12/22/2021   CREATININE 1.23 12/22/2021   BUN 14 12/22/2021   CO2 27 12/22/2021   TSH 1.45 07/20/2021   PSA 0.22 07/20/2021   INR 1.0 12/08/2021   HGBA1C 6.0 10/17/2021   Assessment/Plan:  [name] [name] is a 71 y.o. Black or African American [2] male with  has a past medical history of BENIGN PROSTATIC HYPERTROPHY (05/06/2010), COMMON MIGRAINE (07/31/2007), GERD (07/31/2007), HYPERLIPIDEMIA (01/04/2007), HYPERTENSION (01/04/2007), OVERACTIVE BLADDER (03/06/2007), and Prostate cancer ([place]) (02/04/2015).  Lower GI bleeding Stable resolved, presumed diverticular, for f/u cbc bmp today,  to f/u any worsening symptoms or concerns  Labile hypertension Now mild uncontrolled again, for start benazepril 20 qd with eye to increase to 40 qd in 2-3 wks for persistent elevated SBP at home  Hyperglycemia Lab Results  Component Value Date   HGBA1C 6.0 10/17/2021   Stable, pt to continue current medical treatment  - diet, wt control, excercise   Vitamin D deficiency Last vitamin D Lab Results  Component Value Date   VD25OH 45.37 10/17/2021   Stable, cont oral replacement  Followup: Return in about 6 months (around 06/24/2022).  [name], MD 12/22/2021 9:29 PM [facility]

## 2021-12-22 NOTE — Assessment & Plan Note (Signed)
Stable resolved, presumed diverticular, for f/u cbc bmp today,  to f/u any worsening symptoms or concerns

## 2021-12-22 NOTE — Assessment & Plan Note (Signed)
Now mild uncontrolled again, for start benazepril 20 qd with eye to increase to 40 qd in 2-3 wks for persistent elevated SBP at home

## 2021-12-22 NOTE — Assessment & Plan Note (Signed)
Lab Results  Component Value Date   HGBA1C 6.0 10/17/2021   Stable, pt to continue current medical treatment  - diet, wt control, excercise  

## 2021-12-22 NOTE — Assessment & Plan Note (Signed)
Last vitamin D Lab Results  Component Value Date   VD25OH 45.37 10/17/2021   Stable, cont oral replacement  

## 2021-12-22 NOTE — Patient Instructions (Addendum)
Ok to restart the benazepril at 20 mg per day, and then go to the 40 mg in 2-3 more wks if the BP remains over 140  Please continue all other medications as before, and refills have been done if requested.  Please have the pharmacy call with any other refills you may need.  Please keep your appointments with your specialists as you may have planned  Please go to the LAB at the blood drawing area for the tests to be done  You will be contacted by phone if any changes need to be made immediately.  Otherwise, you will receive a letter about your results with an explanation, but please check with MyChart first.  Please remember to sign up for MyChart if you have not done so, as this will be important to you in the future with finding out test results, communicating by private email, and scheduling acute appointments online when needed.  Please make an Appointment to return in 6 months, or sooner if needed

## 2022-01-10 DIAGNOSIS — Z23 Encounter for immunization: Secondary | ICD-10-CM | POA: Diagnosis not present

## 2022-01-13 ENCOUNTER — Other Ambulatory Visit: Payer: Medicare Other

## 2022-01-25 ENCOUNTER — Other Ambulatory Visit: Payer: Medicare Other

## 2022-01-25 DIAGNOSIS — B9681 Helicobacter pylori [H. pylori] as the cause of diseases classified elsewhere: Secondary | ICD-10-CM

## 2022-01-25 DIAGNOSIS — K2970 Gastritis, unspecified, without bleeding: Secondary | ICD-10-CM | POA: Diagnosis not present

## 2022-01-27 DIAGNOSIS — H0102B Squamous blepharitis left eye, upper and lower eyelids: Secondary | ICD-10-CM | POA: Diagnosis not present

## 2022-01-27 DIAGNOSIS — H401131 Primary open-angle glaucoma, bilateral, mild stage: Secondary | ICD-10-CM | POA: Diagnosis not present

## 2022-01-27 DIAGNOSIS — I10 Essential (primary) hypertension: Secondary | ICD-10-CM | POA: Diagnosis not present

## 2022-01-27 DIAGNOSIS — H35352 Cystoid macular degeneration, left eye: Secondary | ICD-10-CM | POA: Diagnosis not present

## 2022-01-27 DIAGNOSIS — H35373 Puckering of macula, bilateral: Secondary | ICD-10-CM | POA: Diagnosis not present

## 2022-01-27 DIAGNOSIS — Z961 Presence of intraocular lens: Secondary | ICD-10-CM | POA: Diagnosis not present

## 2022-01-27 DIAGNOSIS — H26491 Other secondary cataract, right eye: Secondary | ICD-10-CM | POA: Diagnosis not present

## 2022-01-27 DIAGNOSIS — H0102A Squamous blepharitis right eye, upper and lower eyelids: Secondary | ICD-10-CM | POA: Diagnosis not present

## 2022-01-27 DIAGNOSIS — H02834 Dermatochalasis of left upper eyelid: Secondary | ICD-10-CM | POA: Diagnosis not present

## 2022-01-27 DIAGNOSIS — H02831 Dermatochalasis of right upper eyelid: Secondary | ICD-10-CM | POA: Diagnosis not present

## 2022-01-27 LAB — H. PYLORI ANTIGEN, STOOL: H pylori Ag, Stl: NEGATIVE

## 2022-02-02 ENCOUNTER — Encounter: Payer: Self-pay | Admitting: Internal Medicine

## 2022-02-09 ENCOUNTER — Ambulatory Visit (INDEPENDENT_AMBULATORY_CARE_PROVIDER_SITE_OTHER): Payer: Medicare Other | Admitting: Internal Medicine

## 2022-02-09 VITALS — BP 160/98 | HR 70 | Temp 97.7°F | Ht 67.0 in | Wt 193.1 lb

## 2022-02-09 DIAGNOSIS — R0989 Other specified symptoms and signs involving the circulatory and respiratory systems: Secondary | ICD-10-CM

## 2022-02-09 DIAGNOSIS — F410 Panic disorder [episodic paroxysmal anxiety] without agoraphobia: Secondary | ICD-10-CM

## 2022-02-09 DIAGNOSIS — R739 Hyperglycemia, unspecified: Secondary | ICD-10-CM

## 2022-02-09 DIAGNOSIS — F411 Generalized anxiety disorder: Secondary | ICD-10-CM | POA: Diagnosis not present

## 2022-02-09 NOTE — Patient Instructions (Addendum)
.  As a test, please HOLD the tamsulosin for 2 days and check your BP at home  If no dropping of the BP on those 2 days, please try the tamsulosin again to see if the drop returns  We may have to stop the tamsulosin if the BP keeps falling when you take this, though this would be a very rare side effect  Please take all new medication as prescribed - losartan 25 mg if stopping and starting the tamsulosin does not seem to make any difference  Please continue all other medications as before, and refills have been done if requested.  Please have the pharmacy call with any other refills you may need.  Please keep your appointments with your specialists as you may have planned

## 2022-02-09 NOTE — Progress Notes (Signed)
Patient ID: [name] [name], male   DOB: 1950/10/09, 71 y.o.   MRN: [id number]        Chief Complaint: follow up HTN       HPI:  [name] [name] is a 71 y.o. male here to f/u with last 3 days documented BP in the am  - 147/88, 153/88,140/86 with HR 50's; but then PM 84/63, 94/61, and 114/75 with Hr 100s.  Pt denies chest pain, increased sob or doe, wheezing, orthopnea, PND, increased LE swelling, palpitations, but has dizziness and weakness with lower pressures later in the day.  Current med is lotensin 40 mg qd,  Pt denies polydipsia, polyuria, or new focal neuro s/s.    Pt denies fever, wt loss, night sweats, loss of appetite, or other constitutional symptoms  Also taking tamsulosin.  Denies worsening depressive symptoms, suicidal ideation, or panic; has ongoing anxiety, not increased recently.  Pt denies polydipsia, polyuria, or new focal neuro s/s.        Wt Readings from Last 3 Encounters:  02/09/22 193 lb 2 oz (87.6 kg)  12/22/21 192 lb 6.4 oz (87.3 kg)  12/20/21 192 lb (87.1 kg)   BP Readings from Last 3 Encounters:  02/09/22 (!) 160/98  12/22/21 138/80  12/20/21 130/88         Past Medical History:  Diagnosis Date   BENIGN PROSTATIC HYPERTROPHY 05/06/2010   COMMON MIGRAINE 07/31/2007   GERD 07/31/2007   HYPERLIPIDEMIA 01/04/2007   HYPERTENSION 01/04/2007   OVERACTIVE BLADDER 03/06/2007   Prostate cancer ([place]) 02/04/2015   Past Surgical History:  Procedure Laterality Date   BIOPSY  12/11/2021   Procedure: BIOPSY;  Surgeon: Jackquline Denmark, MD;  Location: [facility] ENDOSCOPY;  Service: Gastroenterology;;   COLONOSCOPY  2009   ESOPHAGOGASTRODUODENOSCOPY (EGD) WITH PROPOFOL N/A 12/11/2021   Procedure: ESOPHAGOGASTRODUODENOSCOPY (EGD) WITH PROPOFOL;  Surgeon: Jackquline Denmark, MD;  Location: [place];  Service: Gastroenterology;  Laterality: N/A;    reports that he has never smoked. He has never used smokeless tobacco. He reports that he does not drink alcohol and does not use drugs. family  history includes Heart disease (age of onset: 91) in his brother; Lung cancer (age of onset: 31) in his father. Allergies  Allergen Reactions   Beta Adrenergic Blockers Other (See Comments)    bradycardia   Celebrex [Celecoxib] Other (See Comments)    Stroke like symptoms   Crestor [Rosuvastatin] Other (See Comments)    weakness   Detrol [Tolterodine] Other (See Comments)    headache   Ditropan [Oxybutynin] Other (See Comments)    Dry mouth   Hydralazine Other (See Comments)    Pt seems abnormally sensitive to med.  Got just '50mg'$  PO, dropped SBP from 180 to 73 with paradoxical sinus bradycardia in low 40s. If going to try to use in future, consider dramatically reduced dose.   Norvasc [Amlodipine Besylate] Swelling   Tadalafil Other (See Comments)    headache   Toprol Xl [Metoprolol] Other (See Comments)    dizzy   Current Outpatient Medications on File Prior to Visit  Medication Sig Dispense Refill   aspirin 81 MG tablet Take 1 tablet (81 mg total) by mouth daily.     atorvastatin (LIPITOR) 40 MG tablet Take 1 tablet (40 mg total) by mouth daily. 90 tablet 3   Cholecalciferol 50 MCG (2000 UT) TABS 1 tab by mouth once daily (Patient taking differently: Take 2,000 Units by mouth daily.) 30 tablet 99   dorzolamide-timolol (COSOPT) 22.3-6.8 MG/ML  ophthalmic solution Place 1 drop into both eyes 2 (two) times daily.     GEMTESA 75 MG TABS Take 75 mg by mouth daily. SAMPLES     ketorolac (ACULAR) 0.5 % ophthalmic solution Place 1 drop into the left eye 4 (four) times daily.     latanoprost (XALATAN) 0.005 % ophthalmic solution Place 1 drop into both eyes at bedtime.  1   pantoprazole (PROTONIX) 40 MG tablet Take 1 tablet (40 mg total) by mouth daily. 30 tablet 3   polyethylene glycol (MIRALAX / GLYCOLAX) packet Take 17 g by mouth as needed for mild constipation.     potassium chloride (KLOR-CON 10) 10 MEQ tablet Take 1 tablet (10 mEq total) by mouth daily. 90 tablet 3   sildenafil  (REVATIO) 20 MG tablet TAKE THREE TABLETS BY MOUTH AS NEEDED (Patient taking differently: Take 60 mg by mouth as needed (erectile dysfunction).) 90 tablet 11   SYSTANE ULTRA 0.4-0.3 % SOLN Place 1 drop into both eyes daily as needed (dryness).     tamsulosin (FLOMAX) 0.4 MG CAPS capsule TAKE 1 CAPSULE BY MOUTH EVERY DAY (Patient taking differently: Take 0.4 mg by mouth daily.) 90 capsule 1   pantoprazole (PROTONIX) 40 MG tablet Take 1 tablet (40 mg total) by mouth 2 (two) times daily for 14 days. 28 tablet 0   No current facility-administered medications on file prior to visit.        ROS:  All others reviewed and negative.  Objective        PE:  BP (!) 160/98   Pulse 70   Temp 97.7 F (36.5 C) (Oral)   Ht '5\' 7"'$  (1.702 m)   Wt 193 lb 2 oz (87.6 kg)   SpO2 96%   BMI 30.25 kg/m                 Constitutional: Pt appears in NAD               HENT: Head: NCAT.                Right Ear: External ear normal.                 Left Ear: External ear normal.                Eyes: . Pupils are equal, round, and reactive to light. Conjunctivae and EOM are normal               Nose: without d/c or deformity               Neck: Neck supple. Gross normal ROM               Cardiovascular: Normal rate and regular rhythm.                 Pulmonary/Chest: Effort normal and breath sounds without rales or wheezing.                Abd:  Soft, NT, ND, + BS, no organomegaly               Neurological: Pt is alert. At baseline orientation, motor grossly intact               Skin: Skin is warm. No rashes, no other new lesions, LE edema - none               Psychiatric: Pt behavior is normal without agitation   Micro: none  Cardiac tracings I have personally interpreted today:  none  Pertinent Radiological findings (summarize): none   Lab Results  Component Value Date   WBC 6.0 12/22/2021   HGB 11.9 (L) 12/22/2021   HCT 35.6 (L) 12/22/2021   PLT 314.0 12/22/2021   GLUCOSE 97 12/22/2021   CHOL 151  10/17/2021   TRIG 66.0 10/17/2021   HDL 57.40 10/17/2021   LDLDIRECT 139.2 10/29/2012   LDLCALC 80 10/17/2021   ALT 18 12/10/2021   AST 21 12/10/2021   NA 138 12/22/2021   K 3.9 12/22/2021   CL 103 12/22/2021   CREATININE 1.23 12/22/2021   BUN 14 12/22/2021   CO2 27 12/22/2021   TSH 1.45 07/20/2021   PSA 0.22 07/20/2021   INR 1.0 12/08/2021   HGBA1C 6.0 10/17/2021   Assessment/Plan:  [name] [name] is a 71 y.o. Black or African American [2] male with  has a past medical history of BENIGN PROSTATIC HYPERTROPHY (05/06/2010), COMMON MIGRAINE (07/31/2007), GERD (07/31/2007), HYPERLIPIDEMIA (01/04/2007), HYPERTENSION (01/04/2007), OVERACTIVE BLADDER (03/06/2007), and Prostate cancer ([place]) (02/04/2015).  Labile hypertension Mild to mod, and very sensitive to ACE it seems; pt to try off tamsulozin for 2 days then trial restart if BP stabilies;  If unable to tolerate tamsulosin it should be stopped, and then losartan 25 mg qd  Hyperglycemia Lab Results  Component Value Date   HGBA1C 6.0 10/17/2021   Stable, pt to continue current medical treatment  - diet, wt control, excercise   Generalized anxiety disorder with panic attacks Stable currently, pt declines need for change in tx at this time or referral  Followup: Return in about 2 months (around 04/21/2022).  [name], MD 02/11/2022 8:33 PM [facility]

## 2022-02-11 ENCOUNTER — Encounter: Payer: Self-pay | Admitting: Internal Medicine

## 2022-02-11 NOTE — Assessment & Plan Note (Signed)
Lab Results  Component Value Date   HGBA1C 6.0 10/17/2021   Stable, pt to continue current medical treatment  - diet, wt control, excercise  

## 2022-02-11 NOTE — Assessment & Plan Note (Signed)
Stable currently, pt declines need for change in tx at this time or referral

## 2022-02-11 NOTE — Assessment & Plan Note (Signed)
Mild to mod, and very sensitive to ACE it seems; pt to try off tamsulozin for 2 days then trial restart if BP stabilies;  If unable to tolerate tamsulosin it should be stopped, and then losartan 25 mg qd

## 2022-02-15 ENCOUNTER — Encounter: Payer: Self-pay | Admitting: Internal Medicine

## 2022-02-15 ENCOUNTER — Telehealth: Payer: Self-pay

## 2022-02-15 NOTE — Telephone Encounter (Signed)
[name] [name] is calling from [facility], they faxed of a requisition form on 10/3. Wanted to know if we have received it or not.

## 2022-02-16 DIAGNOSIS — C61 Malignant neoplasm of prostate: Secondary | ICD-10-CM | POA: Diagnosis not present

## 2022-02-16 MED ORDER — SILODOSIN 4 MG PO CAPS: 4.0000 mg | ORAL_CAPSULE | Freq: Every day | ORAL | 3 refills | Status: DC

## 2022-02-16 NOTE — Telephone Encounter (Signed)
Attempted to contact [facility] regarding requisition form, number provided is not a working number.

## 2022-02-23 DIAGNOSIS — C61 Malignant neoplasm of prostate: Secondary | ICD-10-CM | POA: Diagnosis not present

## 2022-02-23 DIAGNOSIS — R3915 Urgency of urination: Secondary | ICD-10-CM | POA: Diagnosis not present

## 2022-02-23 DIAGNOSIS — N401 Enlarged prostate with lower urinary tract symptoms: Secondary | ICD-10-CM | POA: Diagnosis not present

## 2022-02-28 ENCOUNTER — Other Ambulatory Visit: Payer: Self-pay | Admitting: Internal Medicine

## 2022-02-28 DIAGNOSIS — N138 Other obstructive and reflux uropathy: Secondary | ICD-10-CM

## 2022-02-28 NOTE — Telephone Encounter (Signed)
It looks like his bp dropped and went back up again.

## 2022-03-25 ENCOUNTER — Ambulatory Visit (HOSPITAL_COMMUNITY)
Admission: RE | Admit: 2022-03-25 | Discharge: 2022-03-25 | Disposition: A | Discharge status: Home / Self Care | Payer: Medicare Other | Source: Ambulatory Visit

## 2022-03-25 ENCOUNTER — Encounter (HOSPITAL_COMMUNITY): Payer: Self-pay

## 2022-03-25 ENCOUNTER — Ambulatory Visit (INDEPENDENT_AMBULATORY_CARE_PROVIDER_SITE_OTHER): Payer: Medicare Other

## 2022-03-25 VITALS — BP 166/94 | HR 70 | Temp 98.3°F | Resp 18

## 2022-03-25 DIAGNOSIS — R059 Cough, unspecified: Secondary | ICD-10-CM

## 2022-03-25 DIAGNOSIS — J4521 Mild intermittent asthma with (acute) exacerbation: Secondary | ICD-10-CM

## 2022-03-25 DIAGNOSIS — R0602 Shortness of breath: Secondary | ICD-10-CM

## 2022-03-25 MED ORDER — PREDNISONE 20 MG PO TABS: 20.0000 mg | ORAL_TABLET | Freq: Every day | ORAL | 0 refills | Status: AC

## 2022-03-25 MED ORDER — ALBUTEROL SULFATE HFA 108 (90 BASE) MCG/ACT IN AERS
INHALATION_SPRAY | RESPIRATORY_TRACT | Status: AC
  Filled 2022-03-25: qty 6.7

## 2022-03-25 MED ORDER — BENZONATATE 100 MG PO CAPS: 100.0000 mg | ORAL_CAPSULE | Freq: Three times a day (TID) | ORAL | 0 refills | Status: DC

## 2022-03-25 MED ORDER — ALBUTEROL SULFATE HFA 108 (90 BASE) MCG/ACT IN AERS
2.0000 | INHALATION_SPRAY | Freq: Once | RESPIRATORY_TRACT | Status: AC
  Administered 2022-03-25: 2 via RESPIRATORY_TRACT

## 2022-03-25 NOTE — ED Provider Notes (Signed)
[place]    CSN: [id number] Arrival date & time: 03/25/22  1404      History   Chief Complaint Chief Complaint  Patient presents with   Cough   Appt    1430    HPI [name] [name] is a 71 y.o. male.   Patient presents today with a 2-week history of cough and shortness of breath.  Reports associated chest tightness but denies any chest pain, fever, nausea, vomiting.  He does report some congestion and drainage.  Denies any known sick contacts.  He has not had COVID in the past.  He has tried Mucinex over-the-counter without improvement of symptoms.  Reports a history of asthmatic bronchitis with similar presentation.  He has not been using albuterol inhaler as he does not have this available.  Denies formal diagnosis of COPD, asthma, allergies.  He does not smoke.  Denies any recent antibiotics or steroids.    Past Medical History:  Diagnosis Date   BENIGN PROSTATIC HYPERTROPHY 05/06/2010   COMMON MIGRAINE 07/31/2007   GERD 07/31/2007   HYPERLIPIDEMIA 01/04/2007   HYPERTENSION 01/04/2007   OVERACTIVE BLADDER 03/06/2007   Prostate cancer ([place]) 02/04/2015    Patient Active Problem List   Diagnosis Date Noted   Hypotension 12/11/2021   Diverticulosis of intestine with bleeding 12/11/2021   GI bleed 12/10/2021   Primary hypertension 12/10/2021   Acute blood loss anemia 12/10/2021   Lower GI bleeding 12/08/2021   Bradycardia 07/20/2021   Myofascial pain syndrome, cervical 04/19/2021   Vitamin D deficiency 09/25/2019   Dysuria 05/15/2019   Palpitation 02/25/2019   Paroxysmal tachycardia ([place]) 02/25/2019   Tachycardia 01/14/2019   Generalized anxiety disorder with panic attacks 09/25/2018   Mid back pain on right side 05/14/2018   Hyperglycemia 03/26/2018   Low back pain 04/19/2017   Lightheadedness 10/26/2016   Eustachian tube dysfunction, right 08/17/2016   Allergic rhinitis 08/17/2016   Erectile dysfunction 27/74/1287   Diastolic dysfunction  86/76/7209   Syncope 04/04/2016   Weakness 03/26/2015   Malignant neoplasm of prostate ([place]) 02/23/2015   Chest pain 10/01/2013   Cough 08/04/2013   Asthmatic bronchitis 07/15/2013   Microhematuria 05/09/2012   Constipation 08/13/2011   Ventral hernia 47/01/6282   Umbilical hernia 66/29/4765   Preventative health care 04/25/2011   Nocturia 06/29/2010   BPH (benign prostatic hyperplasia) 05/06/2010   BACK PAIN 05/06/2010   SKIN LESION 11/30/2008   FREQUENCY, URINARY 11/30/2008   COMMON MIGRAINE 07/31/2007   GERD 07/31/2007   OVERACTIVE BLADDER 03/06/2007   Hypercholesterolemia 01/04/2007   Labile hypertension 01/04/2007    Past Surgical History:  Procedure Laterality Date   BIOPSY  12/11/2021   Procedure: BIOPSY;  Surgeon: Jackquline Denmark, MD;  Location: [facility] ENDOSCOPY;  Service: Gastroenterology;;   COLONOSCOPY  2009   ESOPHAGOGASTRODUODENOSCOPY (EGD) WITH PROPOFOL N/A 12/11/2021   Procedure: ESOPHAGOGASTRODUODENOSCOPY (EGD) WITH PROPOFOL;  Surgeon: Jackquline Denmark, MD;  Location: [facility] ENDOSCOPY;  Service: Gastroenterology;  Laterality: N/A;       Home Medications    Prior to Admission medications   Medication Sig Start Date End Date Taking? Authorizing Provider  aspirin 81 MG tablet Take 1 tablet (81 mg total) by mouth daily. 12/18/21  Yes Mariel Aloe, MD  atorvastatin (LIPITOR) 40 MG tablet Take 1 tablet (40 mg total) by mouth daily. 07/20/21  Yes Biagio Borg, MD  BENAZEPRIL HCL PO Take by mouth.   Yes [provider]  benzonatate (TESSALON) 100 MG capsule Take 1 capsule (100  mg total) by mouth every 8 (eight) hours. 03/25/22  Yes Codi Kertz, Derry Skill, PA-C  Cholecalciferol 50 MCG (2000 UT) TABS 1 tab by mouth once daily Patient taking differently: Take 2,000 Units by mouth daily. 07/20/21  Yes Biagio Borg, MD  dorzolamide-timolol (COSOPT) 22.3-6.8 MG/ML ophthalmic solution Place 1 drop into both eyes 2 (two) times daily. 09/24/20  Yes [provider]  GEMTESA 75  MG TABS Take 75 mg by mouth daily. SAMPLES 11/29/21  Yes [provider]  ketorolac (ACULAR) 0.5 % ophthalmic solution Place 1 drop into the left eye 4 (four) times daily. 12/16/21  Yes [provider]  latanoprost (XALATAN) 0.005 % ophthalmic solution Place 1 drop into both eyes at bedtime. 11/09/17  Yes [provider]  potassium chloride (KLOR-CON 10) 10 MEQ tablet Take 1 tablet (10 mEq total) by mouth daily. 07/20/21  Yes Biagio Borg, MD  predniSONE (DELTASONE) 20 MG tablet Take 1 tablet (20 mg total) by mouth daily for 5 days. 03/25/22 03/30/22 Yes Mancel Lardizabal K, PA-C  pantoprazole (PROTONIX) 40 MG tablet Take 1 tablet (40 mg total) by mouth 2 (two) times daily for 14 days. 12/19/21 01/02/22  Jackquline Denmark, MD  polyethylene glycol [facility] Raveling) packet Take 17 g by mouth as needed for mild constipation.    [provider]  sildenafil (REVATIO) 20 MG tablet TAKE THREE TABLETS BY MOUTH AS NEEDED Patient taking differently: Take 60 mg by mouth as needed (erectile dysfunction). 04/26/21   Biagio Borg, MD  SYSTANE ULTRA 0.4-0.3 % SOLN Place 1 drop into both eyes daily as needed (dryness). 08/14/19   [provider]    Family History Family History  Problem Relation Age of Onset   Lung cancer Father 46   Heart disease Brother 33       died from MI    Colon cancer Neg Hx    Esophageal cancer Neg Hx    Rectal cancer Neg Hx    Stomach cancer Neg Hx     Social History Social History   Tobacco Use   Smoking status: Never   Smokeless tobacco: Never  Vaping Use   Vaping Use: Never used  Substance Use Topics   Alcohol use: No    Alcohol/week: 0.0 standard drinks of alcohol   Drug use: No     Allergies   Beta adrenergic blockers, Celebrex [celecoxib], Crestor [rosuvastatin], Detrol [tolterodine], Ditropan [oxybutynin], Hydralazine, Norvasc [amlodipine besylate], Rapaflo [silodosin], Tadalafil, Tamsulosin, and Toprol xl  [metoprolol]   Review of Systems Review of Systems  Constitutional:  Positive for activity change. Negative for appetite change, fatigue and fever.  HENT:  Positive for congestion, postnasal drip and sinus pressure. Negative for sneezing and sore throat.   Respiratory:  Positive for cough, chest tightness and shortness of breath. Negative for wheezing.   Cardiovascular:  Negative for chest pain.  Gastrointestinal:  Negative for abdominal pain, diarrhea, nausea and vomiting.  Neurological:  Negative for dizziness, light-headedness and headaches.     Physical Exam Triage Vital Signs ED Triage Vitals  Enc Vitals Group     BP 03/25/22 1439 (!) 166/94     Pulse Rate 03/25/22 1439 70     Resp 03/25/22 1439 18     Temp 03/25/22 1439 98.3 F (36.8 C)     Temp Source 03/25/22 1439 Oral     SpO2 03/25/22 1439 97 %     Weight --      Height --  Head Circumference --      Peak Flow --      Pain Score 03/25/22 1441 0     Pain Loc --      Pain Edu? --      Excl. in [place]? --    No data found.  Updated Vital Signs BP (!) 166/94 Comment: states took HTN med this AM; "my BP is always high when I go to the dr"  Pulse 70   Temp 98.3 F (36.8 C) (Oral)   Resp 18   SpO2 97%   Visual Acuity Right Eye Distance:   Left Eye Distance:   Bilateral Distance:    Right Eye Near:   Left Eye Near:    Bilateral Near:     Physical Exam Vitals reviewed.  Constitutional:      General: He is awake.     Appearance: Normal appearance. He is well-developed. He is not ill-appearing.     Comments: Very pleasant male appears stated age in no acute distress sitting comfortably in exam room  HENT:     Head: Normocephalic and atraumatic.     Right Ear: Tympanic membrane, ear canal and external ear normal. Tympanic membrane is not erythematous or bulging.     Left Ear: Ear canal and external ear normal. A middle ear effusion is present. Tympanic membrane is not erythematous or bulging.     Nose:  Nose normal.     Mouth/Throat:     Pharynx: Uvula midline. No oropharyngeal exudate or posterior oropharyngeal erythema.  Cardiovascular:     Rate and Rhythm: Normal rate and regular rhythm.     Heart sounds: Normal heart sounds, S1 normal and S2 normal. No murmur heard. Pulmonary:     Effort: Pulmonary effort is normal. No accessory muscle usage or respiratory distress.     Breath sounds: No stridor. Examination of the right-lower field reveals decreased breath sounds. Examination of the left-lower field reveals decreased breath sounds. Decreased breath sounds present. No wheezing, rhonchi or rales.     Comments: Decreased aeration bilateral bases Neurological:     Mental Status: He is alert.  Psychiatric:        Behavior: Behavior is cooperative.      UC Treatments / Results  Labs (all labs ordered are listed, but only abnormal results are displayed) Labs Reviewed - No data to display  EKG   Radiology DG Chest 2 View  Result Date: 03/25/2022 CLINICAL DATA:  Cough and shortness of breath for 2 weeks. EXAM: CHEST - 2 VIEW COMPARISON:  06/12/2018 FINDINGS: The heart size and mediastinal contours are within normal limits. Both lungs are clear. The visualized skeletal structures are unremarkable. IMPRESSION: No active cardiopulmonary disease. Electronically Signed   By: Marlaine Hind M.D.   On: 03/25/2022 15:33    Procedures Procedures (including critical care time)  Medications Ordered in UC Medications  albuterol (VENTOLIN HFA) 108 (90 Base) MCG/ACT inhaler 2 puff (2 puffs Inhalation Given 03/25/22 1500)    Initial Impression / Assessment and Plan / UC Course  I have reviewed the triage vital signs and the nursing notes.  Pertinent labs & imaging results that were available during my care of the patient were reviewed by me and considered in my medical decision making (see chart for details).     Patient is well-appearing, afebrile, nontoxic, nontachycardic.  He does have  a history of asthmatic bronchitis and suspect that that is the etiology of current symptoms.  Chest x-ray was obtained  that showed no acute cardiopulmonary disease.  Viral testing was deferred as patient has been symptomatic for several weeks.  No evidence of acute infection on physical exam that would warrant initiation of antibiotics.  Patient was given albuterol in clinic with improvement of symptoms.  He was sent home with this medication with instruction to use it every 4-6 hours as needed.  We will start low-dose prednisone burst of 20 mg for 5 days.  He was instructed not to take NSAIDs with this medication due to risk of GI bleeding.  Can use Mucinex, Tylenol, Flonase for symptom relief.  She was given Tessalon for cough.  Recommended he rest and drink plenty of fluid.  He is to follow-up with his primary care next week if symptoms do not resolve.  If he has any worsening symptoms such as high fever, chest pain, shortness of breath, nausea/vomiting, weakness he needs to go to the emergency room.  Strict return precautions given.  Patient declined work excuse note.  Final Clinical Impressions(s) / UC Diagnoses   Final diagnoses:  Mild intermittent asthmatic bronchitis with acute exacerbation     Discharge Instructions      Your x-ray was normal.  I am glad that you are feeling better after the albuterol.  Take this home and use this every 4-6 hours as needed.  Start prednisone 20 mg in the morning (03/26/2022).  Do not take NSAIDs with this medication including aspirin, ibuprofen/Advil, naproxen/Aleve.  You can use over-the-counter medications such as Mucinex, Flonase, Tylenol for additional symptom relief.  Use Tessalon for cough.  Make sure you are resting and drinking plenty of fluid.  If your symptoms are proving by next week please return for reevaluation.  If you have any worsening symptoms including worsening cough, shortness of breath, chest pain, nausea, vomiting you need to be seen  immediately.     ED Prescriptions     Medication Sig Dispense Auth. Provider   benzonatate (TESSALON) 100 MG capsule Take 1 capsule (100 mg total) by mouth every 8 (eight) hours. 21 capsule Kamaiya Antilla K, PA-C   predniSONE (DELTASONE) 20 MG tablet Take 1 tablet (20 mg total) by mouth daily for 5 days. 5 tablet Magaby Rumberger, Derry Skill, PA-C      PDMP not reviewed this encounter.   Terrilee Croak, PA-C 03/25/22 1546

## 2022-03-25 NOTE — ED Triage Notes (Signed)
C/O cough and dyspnea "off and on" x 2 wks; states just wishes to get checked out, as this is how he felt when he had bronchitis in past. Denies fevers. Describes coughing fits with any deep breathing.

## 2022-03-25 NOTE — Discharge Instructions (Signed)
Your x-ray was normal.  I am glad that you are feeling better after the albuterol.  Take this home and use this every 4-6 hours as needed.  Start prednisone 20 mg in the morning (03/26/2022).  Do not take NSAIDs with this medication including aspirin, ibuprofen/Advil, naproxen/Aleve.  You can use over-the-counter medications such as Mucinex, Flonase, Tylenol for additional symptom relief.  Use Tessalon for cough.  Make sure you are resting and drinking plenty of fluid.  If your symptoms are proving by next week please return for reevaluation.  If you have any worsening symptoms including worsening cough, shortness of breath, chest pain, nausea, vomiting you need to be seen immediately.

## 2022-03-28 ENCOUNTER — Ambulatory Visit (INDEPENDENT_AMBULATORY_CARE_PROVIDER_SITE_OTHER): Payer: Medicare Other | Admitting: Internal Medicine

## 2022-03-28 VITALS — BP 168/92 | HR 68 | Temp 97.7°F | Ht 67.0 in | Wt 198.0 lb

## 2022-03-28 DIAGNOSIS — E559 Vitamin D deficiency, unspecified: Secondary | ICD-10-CM

## 2022-03-28 DIAGNOSIS — J4541 Moderate persistent asthma with (acute) exacerbation: Secondary | ICD-10-CM

## 2022-03-28 DIAGNOSIS — R0989 Other specified symptoms and signs involving the circulatory and respiratory systems: Secondary | ICD-10-CM | POA: Diagnosis not present

## 2022-03-28 DIAGNOSIS — J45909 Unspecified asthma, uncomplicated: Secondary | ICD-10-CM | POA: Insufficient documentation

## 2022-03-28 DIAGNOSIS — R739 Hyperglycemia, unspecified: Secondary | ICD-10-CM | POA: Diagnosis not present

## 2022-03-28 DIAGNOSIS — J4530 Mild persistent asthma, uncomplicated: Secondary | ICD-10-CM

## 2022-03-28 MED ORDER — ALBUTEROL SULFATE HFA 108 (90 BASE) MCG/ACT IN AERS: 2.0000 | INHALATION_SPRAY | Freq: Four times a day (QID) | RESPIRATORY_TRACT | 11 refills | Status: DC | PRN

## 2022-03-28 MED ORDER — HYDROCHLOROTHIAZIDE 25 MG PO TABS: 25.0000 mg | ORAL_TABLET | Freq: Every day | ORAL | 3 refills | Status: DC

## 2022-03-28 MED ORDER — METHYLPREDNISOLONE ACETATE 80 MG/ML IJ SUSP
80.0000 mg | Freq: Once | INTRAMUSCULAR | Status: AC
  Administered 2022-03-28: 80 mg via INTRAMUSCULAR

## 2022-03-28 NOTE — Patient Instructions (Addendum)
You had the steroid shot today  Please finish the prednisone, and continue the inhaler (new script was sent)  Please call in 2 weeks if you are still having to use the inhaler more then twice per wk  Ok to increase the Hct fluid pill to 25 mg per day for BP  Please continue all other medications as before  Please have the pharmacy call with any other refills you may need.  Please continue your efforts at being more active, low cholesterol diet, and weight control.  Please keep your appointments with your specialists as you may have planned  Please make an Appointment to return in 3 months, or sooner if needed

## 2022-03-28 NOTE — Progress Notes (Unsigned)
Patient ID: [name] [name], male   DOB: 06/07/1950, 71 y.o.   MRN: [id number]        Chief Complaint: new asthma flare, htn, hyperglycemia, low vit d       HPI:  [name] [name] is a 71 y.o. male here with 3 days onset new for him cough with bronchial congestion can't seem to mobilize with wheezing, sob doe, but no fever, chills and Pt denies chest pain, increased sob or doe, wheezing, orthopnea, PND, increased LE swelling, palpitations, dizziness or syncope.   Pt denies polydipsia, polyuria, or new focal neuro s/s.    Pt denies fever, wt loss, night sweats, loss of appetite, or other constitutional symptoms  BP at home has been > 140/90 consistently in the past wk.         Wt Readings from Last 3 Encounters:  03/28/22 198 lb (89.8 kg)  02/09/22 193 lb 2 oz (87.6 kg)  12/22/21 192 lb 6.4 oz (87.3 kg)   BP Readings from Last 3 Encounters:  03/28/22 (!) 168/92  03/25/22 (!) 166/94  02/09/22 (!) 160/98         Past Medical History:  Diagnosis Date   BENIGN PROSTATIC HYPERTROPHY 05/06/2010   COMMON MIGRAINE 07/31/2007   GERD 07/31/2007   HYPERLIPIDEMIA 01/04/2007   HYPERTENSION 01/04/2007   OVERACTIVE BLADDER 03/06/2007   Prostate cancer ([place]) 02/04/2015   Past Surgical History:  Procedure Laterality Date   BIOPSY  12/11/2021   Procedure: BIOPSY;  Surgeon: Jackquline Denmark, MD;  Location: [facility] ENDOSCOPY;  Service: Gastroenterology;;   COLONOSCOPY  2009   ESOPHAGOGASTRODUODENOSCOPY (EGD) WITH PROPOFOL N/A 12/11/2021   Procedure: ESOPHAGOGASTRODUODENOSCOPY (EGD) WITH PROPOFOL;  Surgeon: Jackquline Denmark, MD;  Location: [place];  Service: Gastroenterology;  Laterality: N/A;    reports that he has never smoked. He has never used smokeless tobacco. He reports that he does not drink alcohol and does not use drugs. family history includes Heart disease (age of onset: 59) in his brother; Lung cancer (age of onset: 47) in his father. Allergies  Allergen Reactions   Beta Adrenergic Blockers Other  (See Comments)    bradycardia   Celebrex [Celecoxib] Other (See Comments)    Stroke like symptoms   Crestor [Rosuvastatin] Other (See Comments)    weakness   Detrol [Tolterodine] Other (See Comments)    headache   Ditropan [Oxybutynin] Other (See Comments)    Dry mouth   Hydralazine Other (See Comments)    Pt seems abnormally sensitive to med.  Got just '50mg'$  PO, dropped SBP from 180 to 73 with paradoxical sinus bradycardia in low 40s. If going to try to use in future, consider dramatically reduced dose.   Norvasc [Amlodipine Besylate] Swelling   Rapaflo [Silodosin] Other (See Comments)    Lwo blood pressures   Tadalafil Other (See Comments)    headache   Tamsulosin Other (See Comments)    Low blood pressure drops with taking   Toprol Xl [Metoprolol] Other (See Comments)    dizzy   Current Outpatient Medications on File Prior to Visit  Medication Sig Dispense Refill   aspirin 81 MG tablet Take 1 tablet (81 mg total) by mouth daily.     atorvastatin (LIPITOR) 40 MG tablet Take 1 tablet (40 mg total) by mouth daily. 90 tablet 3   BENAZEPRIL HCL PO Take by mouth.     benzonatate (TESSALON) 100 MG capsule Take 1 capsule (100 mg total) by mouth every 8 (eight) hours. 21 capsule 0  Cholecalciferol 50 MCG (2000 UT) TABS 1 tab by mouth once daily (Patient taking differently: Take 2,000 Units by mouth daily.) 30 tablet 99   dorzolamide-timolol (COSOPT) 22.3-6.8 MG/ML ophthalmic solution Place 1 drop into both eyes 2 (two) times daily.     GEMTESA 75 MG TABS Take 75 mg by mouth daily. SAMPLES     ketorolac (ACULAR) 0.5 % ophthalmic solution Place 1 drop into the left eye 4 (four) times daily.     latanoprost (XALATAN) 0.005 % ophthalmic solution Place 1 drop into both eyes at bedtime.  1   polyethylene glycol (MIRALAX / GLYCOLAX) packet Take 17 g by mouth as needed for mild constipation.     potassium chloride (KLOR-CON 10) 10 MEQ tablet Take 1 tablet (10 mEq total) by mouth daily. 90 tablet  3   predniSONE (DELTASONE) 20 MG tablet Take 1 tablet (20 mg total) by mouth daily for 5 days. 5 tablet 0   sildenafil (REVATIO) 20 MG tablet TAKE THREE TABLETS BY MOUTH AS NEEDED (Patient taking differently: Take 60 mg by mouth as needed (erectile dysfunction).) 90 tablet 11   SYSTANE ULTRA 0.4-0.3 % SOLN Place 1 drop into both eyes daily as needed (dryness).     No current facility-administered medications on file prior to visit.        ROS:  All others reviewed and negative.  Objective        PE:  BP (!) 168/92 (BP Location: Left Arm, Patient Position: Sitting, Cuff Size: Large)   Pulse 68   Temp 97.7 F (36.5 C) (Oral)   Ht '5\' 7"'$  (1.702 m)   Wt 198 lb (89.8 kg)   SpO2 97%   BMI 31.01 kg/m                 Constitutional: Pt appears in NAD               HENT: Head: NCAT.                Right Ear: External ear normal.                 Left Ear: External ear normal.                Eyes: . Pupils are equal, round, and reactive to light. Conjunctivae and EOM are normal               Nose: without d/c or deformity               Neck: Neck supple. Gross normal ROM               Cardiovascular: Normal rate and regular rhythm.                 Pulmonary/Chest: Effort normal and breath sounds without rales or wheezing.                Abd:  Soft, NT, ND, + BS, no organomegaly               Neurological: Pt is alert. At baseline orientation, motor grossly intact               Skin: Skin is warm. No rashes, no other new lesions, LE edema - none               Psychiatric: Pt behavior is normal without agitation   Micro: none  Cardiac tracings I have personally interpreted today:  none  Pertinent Radiological findings (summarize): none   Lab Results  Component Value Date   WBC 6.0 12/22/2021   HGB 11.9 (L) 12/22/2021   HCT 35.6 (L) 12/22/2021   PLT 314.0 12/22/2021   GLUCOSE 97 12/22/2021   CHOL 151 10/17/2021   TRIG 66.0 10/17/2021   HDL 57.40 10/17/2021   LDLDIRECT 139.2  10/29/2012   LDLCALC 80 10/17/2021   ALT 18 12/10/2021   AST 21 12/10/2021   NA 138 12/22/2021   K 3.9 12/22/2021   CL 103 12/22/2021   CREATININE 1.23 12/22/2021   BUN 14 12/22/2021   CO2 27 12/22/2021   TSH 1.45 07/20/2021   PSA 0.22 07/20/2021   INR 1.0 12/08/2021   HGBA1C 6.0 10/17/2021   Assessment/Plan:  [name] [name] is a 71 y.o. Black or African American [2] male with  has a past medical history of BENIGN PROSTATIC HYPERTROPHY (05/06/2010), COMMON MIGRAINE (07/31/2007), GERD (07/31/2007), HYPERLIPIDEMIA (01/04/2007), HYPERTENSION (01/04/2007), OVERACTIVE BLADDER (03/06/2007), and Prostate cancer ([place]) (02/04/2015).  Vitamin D deficiency Last vitamin D Lab Results  Component Value Date   VD25OH 45.37 10/17/2021   Stable, cont oral replacement   Hyperglycemia Lab Results  Component Value Date   HGBA1C 6.0 10/17/2021   Stable, pt to continue current medical treatment  - diet, wt control, excercise   Labile hypertension BP Readings from Last 3 Encounters:  03/28/22 (!) 168/92  03/25/22 (!) 166/94  02/09/22 (!) 160/98   Uncontrolled, pt to continue medical treatment benazepril 40 mg qd, and add hct 25 mg qd   Asthma New onset uncontrolled, for depomedrol Im 80 mg , pred taper, continue albuterol hfa prn, and consider add symbicort for further uncontrolled symptoms if persists  Followup: Return in about 3 months (around 06/28/2022).  [name], MD 03/30/2022 1:09 PM [facility]

## 2022-03-30 ENCOUNTER — Encounter: Payer: Self-pay | Admitting: Internal Medicine

## 2022-03-30 NOTE — Assessment & Plan Note (Signed)
Last vitamin D Lab Results  Component Value Date   VD25OH 45.37 10/17/2021   Stable, cont oral replacement

## 2022-03-30 NOTE — Assessment & Plan Note (Addendum)
New onset uncontrolled, for depomedrol Im 80 mg , pred taper, continue albuterol hfa prn, and consider add symbicort for further uncontrolled symptoms if persists

## 2022-03-30 NOTE — Assessment & Plan Note (Signed)
Lab Results  Component Value Date   HGBA1C 6.0 10/17/2021   Stable, pt to continue current medical treatment  - diet, wt control, excercise

## 2022-03-30 NOTE — Assessment & Plan Note (Signed)
BP Readings from Last 3 Encounters:  03/28/22 (!) 168/92  03/25/22 (!) 166/94  02/09/22 (!) 160/98   Uncontrolled, pt to continue medical treatment benazepril 40 mg qd, and add hct 25 mg qd

## 2022-04-17 ENCOUNTER — Encounter: Payer: Self-pay | Admitting: Internal Medicine

## 2022-04-21 ENCOUNTER — Ambulatory Visit (INDEPENDENT_AMBULATORY_CARE_PROVIDER_SITE_OTHER): Payer: Medicare Other | Admitting: Internal Medicine

## 2022-04-21 VITALS — BP 136/78 | HR 130 | Temp 97.8°F | Ht 67.0 in | Wt 199.0 lb

## 2022-04-21 DIAGNOSIS — F419 Anxiety disorder, unspecified: Secondary | ICD-10-CM | POA: Diagnosis not present

## 2022-04-21 DIAGNOSIS — R051 Acute cough: Secondary | ICD-10-CM

## 2022-04-21 DIAGNOSIS — R0600 Dyspnea, unspecified: Secondary | ICD-10-CM

## 2022-04-21 DIAGNOSIS — R0989 Other specified symptoms and signs involving the circulatory and respiratory systems: Secondary | ICD-10-CM

## 2022-04-21 DIAGNOSIS — E559 Vitamin D deficiency, unspecified: Secondary | ICD-10-CM

## 2022-04-21 MED ORDER — PREDNISONE 10 MG PO TABS: ORAL_TABLET | ORAL | 0 refills | Status: DC

## 2022-04-21 MED ORDER — LOSARTAN POTASSIUM 50 MG PO TABS: 50.0000 mg | ORAL_TABLET | Freq: Every day | ORAL | 3 refills | Status: DC

## 2022-04-21 MED ORDER — DOXYCYCLINE HYCLATE 100 MG PO TABS: 100.0000 mg | ORAL_TABLET | Freq: Two times a day (BID) | ORAL | 0 refills | Status: DC

## 2022-04-21 NOTE — Patient Instructions (Addendum)
Ok to stop the Hct and Benazepril as you have done  Please take all new medication as prescribed - the mild BP med called losartan 50 mg per day  Please take all new medication as prescribed - the antibiotic, and prednisone  Please continue all other medications as before, and refills have been done if requested.  Please have the pharmacy call with any other refills you may need.  Please continue your efforts at being more active, low cholesterol diet, and weight control.  You are otherwise up to date with prevention measures today.  Please keep your appointments with your specialists as you may have planned  You will be contacted regarding the referral for: PFTs (lung testing)  Please make an Appointment to return in 3 months, or sooner if needed

## 2022-04-21 NOTE — Progress Notes (Unsigned)
Patient ID: [name] [name], male   DOB: May 17, 1950, 71 y.o.   MRN: [id number]        Chief Complaint: follow up HTN, dyspnea, anxiety, low vit d       HPI:  [name] [name] is a 71 y.o. male here with c/o low BP with taking hct 25 mg so had to stop this and benazepril.  Pt denies chest pain, wheezing, orthopnea, PND, increased LE swelling, palpitations, dizziness or syncope, but does have mild dypsnea with inhaler not helping.   Pt denies polydipsia, polyuria, or new focal neuro s/s.    Pt denies fever, wt loss, night sweats, loss of appetite, or other constitutional symptoms.  Denies worsening depressive symptoms, suicidal ideation, or panic.   Here with 2-3 days acute onset fever, facial pain, pressure, headache, general weakness and malaise, and greenish d/c, with mild ST and cough       Wt Readings from Last 3 Encounters:  04/21/22 199 lb (90.3 kg)  03/28/22 198 lb (89.8 kg)  02/09/22 193 lb 2 oz (87.6 kg)   BP Readings from Last 3 Encounters:  04/21/22 136/78  03/28/22 (!) 168/92  03/25/22 (!) 166/94         Past Medical History:  Diagnosis Date   BENIGN PROSTATIC HYPERTROPHY 05/06/2010   COMMON MIGRAINE 07/31/2007   GERD 07/31/2007   HYPERLIPIDEMIA 01/04/2007   HYPERTENSION 01/04/2007   OVERACTIVE BLADDER 03/06/2007   Prostate cancer ([place]) 02/04/2015   Past Surgical History:  Procedure Laterality Date   BIOPSY  12/11/2021   Procedure: BIOPSY;  Surgeon: Jackquline Denmark, MD;  Location: [facility] ENDOSCOPY;  Service: Gastroenterology;;   COLONOSCOPY  2009   ESOPHAGOGASTRODUODENOSCOPY (EGD) WITH PROPOFOL N/A 12/11/2021   Procedure: ESOPHAGOGASTRODUODENOSCOPY (EGD) WITH PROPOFOL;  Surgeon: Jackquline Denmark, MD;  Location: [place];  Service: Gastroenterology;  Laterality: N/A;    reports that he has never smoked. He has never used smokeless tobacco. He reports that he does not drink alcohol and does not use drugs. family history includes Heart disease (age of onset: 78) in his brother; Lung  cancer (age of onset: 18) in his father. Allergies  Allergen Reactions   Beta Adrenergic Blockers Other (See Comments)    bradycardia   Celebrex [Celecoxib] Other (See Comments)    Stroke like symptoms   Crestor [Rosuvastatin] Other (See Comments)    weakness   Detrol [Tolterodine] Other (See Comments)    headache   Ditropan [Oxybutynin] Other (See Comments)    Dry mouth   Hydralazine Other (See Comments)    Pt seems abnormally sensitive to med.  Got just '50mg'$  PO, dropped SBP from 180 to 73 with paradoxical sinus bradycardia in low 40s. If going to try to use in future, consider dramatically reduced dose.   Norvasc [Amlodipine Besylate] Swelling   Rapaflo [Silodosin] Other (See Comments)    Lwo blood pressures   Tadalafil Other (See Comments)    headache   Tamsulosin Other (See Comments)    Low blood pressure drops with taking   Toprol Xl [Metoprolol] Other (See Comments)    dizzy   Current Outpatient Medications on File Prior to Visit  Medication Sig Dispense Refill   albuterol (VENTOLIN HFA) 108 (90 Base) MCG/ACT inhaler Inhale 2 puffs into the lungs every 6 (six) hours as needed for wheezing or shortness of breath. 8 g 11   aspirin 81 MG tablet Take 1 tablet (81 mg total) by mouth daily.     atorvastatin (LIPITOR) 40 MG tablet  Take 1 tablet (40 mg total) by mouth daily. 90 tablet 3   benzonatate (TESSALON) 100 MG capsule Take 1 capsule (100 mg total) by mouth every 8 (eight) hours. 21 capsule 0   Cholecalciferol 50 MCG (2000 UT) TABS 1 tab by mouth once daily (Patient taking differently: Take 2,000 Units by mouth daily.) 30 tablet 99   dorzolamide-timolol (COSOPT) 22.3-6.8 MG/ML ophthalmic solution Place 1 drop into both eyes 2 (two) times daily.     GEMTESA 75 MG TABS Take 75 mg by mouth daily. SAMPLES     ketorolac (ACULAR) 0.5 % ophthalmic solution Place 1 drop into the left eye 4 (four) times daily.     latanoprost (XALATAN) 0.005 % ophthalmic solution Place 1 drop into  both eyes at bedtime.  1   polyethylene glycol (MIRALAX / GLYCOLAX) packet Take 17 g by mouth as needed for mild constipation.     potassium chloride (KLOR-CON 10) 10 MEQ tablet Take 1 tablet (10 mEq total) by mouth daily. 90 tablet 3   sildenafil (REVATIO) 20 MG tablet TAKE THREE TABLETS BY MOUTH AS NEEDED (Patient taking differently: Take 60 mg by mouth as needed (erectile dysfunction).) 90 tablet 11   SYSTANE ULTRA 0.4-0.3 % SOLN Place 1 drop into both eyes daily as needed (dryness).     No current facility-administered medications on file prior to visit.        ROS:  All others reviewed and negative.  Objective        PE:  BP 136/78 (BP Location: Left Arm, Patient Position: Sitting, Cuff Size: Large)   Pulse (!) 130   Temp 97.8 F (36.6 C) (Oral)   Ht '5\' 7"'$  (1.702 m)   Wt 199 lb (90.3 kg)   SpO2 96%   BMI 31.17 kg/m                 Constitutional: Pt appears in NAD               HENT: Head: NCAT.                Right Ear: External ear normal.                 Left Ear: External ear normal.                Eyes: . Pupils are equal, round, and reactive to light. Conjunctivae and EOM are normal               Nose: without d/c or deformity               Neck: Neck supple. Gross normal ROM               Cardiovascular: Normal rate and regular rhythm.                 Pulmonary/Chest: Effort normal and breath sounds without rales or wheezing.                Abd:  Soft, NT, ND, + BS, no organomegaly               Neurological: Pt is alert. At baseline orientation, motor grossly intact               Skin: Skin is warm. No rashes, no other new lesions, LE edema - none               Psychiatric: Pt behavior is normal without agitation  Micro: none  Cardiac tracings I have personally interpreted today:  none  Pertinent Radiological findings (summarize): none   Lab Results  Component Value Date   WBC 6.0 12/22/2021   HGB 11.9 (L) 12/22/2021   HCT 35.6 (L) 12/22/2021   PLT 314.0  12/22/2021   GLUCOSE 97 12/22/2021   CHOL 151 10/17/2021   TRIG 66.0 10/17/2021   HDL 57.40 10/17/2021   LDLDIRECT 139.2 10/29/2012   LDLCALC 80 10/17/2021   ALT 18 12/10/2021   AST 21 12/10/2021   NA 138 12/22/2021   K 3.9 12/22/2021   CL 103 12/22/2021   CREATININE 1.23 12/22/2021   BUN 14 12/22/2021   CO2 27 12/22/2021   TSH 1.45 07/20/2021   PSA 0.22 07/20/2021   INR 1.0 12/08/2021   HGBA1C 6.0 10/17/2021   Assessment/Plan:  [name] [name] is a 71 y.o. Black or African American [2] male with  has a past medical history of BENIGN PROSTATIC HYPERTROPHY (05/06/2010), COMMON MIGRAINE (07/31/2007), GERD (07/31/2007), HYPERLIPIDEMIA (01/04/2007), HYPERTENSION (01/04/2007), OVERACTIVE BLADDER (03/06/2007), and Prostate cancer ([place]) (02/04/2015).  Dyspnea Etiology unclear, exam benign, for PFTs to further assess  Anxiety At least mild chronic persistent, declines need for tx at this time  Vitamin D deficiency Last vitamin D Lab Results  Component Value Date   VD25OH 45.37 10/17/2021   Stable, cont oral replacement   Labile hypertension BP Readings from Last 3 Encounters:  04/21/22 136/78  03/28/22 (!) 168/92  03/25/22 (!) 166/94   ? White coat element pt to continue medical treatment losartan 50 mg   Cough Mild to mod, for antibx course, prednisone asd, to f/u any worsening symptoms or concerns  Followup: No follow-ups on file.  [name], MD 04/23/2022 4:42 PM [facility]

## 2022-04-23 DIAGNOSIS — F419 Anxiety disorder, unspecified: Secondary | ICD-10-CM | POA: Insufficient documentation

## 2022-04-23 DIAGNOSIS — R0600 Dyspnea, unspecified: Secondary | ICD-10-CM | POA: Insufficient documentation

## 2022-04-23 NOTE — Assessment & Plan Note (Signed)
Last vitamin D Lab Results  Component Value Date   VD25OH 45.37 10/17/2021   Stable, cont oral replacement

## 2022-04-23 NOTE — Assessment & Plan Note (Signed)
At least mild chronic persistent, declines need for tx at this time

## 2022-04-23 NOTE — Assessment & Plan Note (Signed)
BP Readings from Last 3 Encounters:  04/21/22 136/78  03/28/22 (!) 168/92  03/25/22 (!) 166/94   ? White coat element pt to continue medical treatment losartan 50 mg

## 2022-04-23 NOTE — Assessment & Plan Note (Signed)
Etiology unclear, exam benign, for PFTs to further assess

## 2022-04-23 NOTE — Assessment & Plan Note (Signed)
Mild to mod, for antibx course, prednisone asd, to f/u any worsening symptoms or concerns

## 2022-04-25 ENCOUNTER — Encounter: Payer: Self-pay | Admitting: Internal Medicine

## 2022-04-25 NOTE — Telephone Encounter (Signed)
Patient is experiencing tachycardia and wants advice to help lower it

## 2022-04-28 ENCOUNTER — Encounter: Payer: Self-pay | Admitting: Internal Medicine

## 2022-04-28 DIAGNOSIS — R000 Tachycardia, unspecified: Secondary | ICD-10-CM

## 2022-04-28 NOTE — Telephone Encounter (Signed)
Pt requesting cardiology referral due to high heart rate

## 2022-04-28 NOTE — Telephone Encounter (Signed)
Ok referral done 

## 2022-05-02 NOTE — Telephone Encounter (Signed)
Spoke with patient and he understood to go to urgent care or find someone within [facility]

## 2022-05-15 NOTE — Progress Notes (Signed)
Cardiology Office Note:    Date:  05/15/2022   ID:  [name] [name], DOB 1950/06/10, MRN [id number]  PCP:  Biagio Borg, MD  Cardiologist:  Buford Dresser, MD  Referring MD: Biagio Borg, MD   CC:  New patient evaluation for tachycardia  History of Present Illness:    [name] [name] is a 72 y.o. male with a hx of hypertension, hyperlipidemia, GERD, BPH, and prostate cancer, who is seen as a new consult at the request of Biagio Borg, MD for the evaluation and management of tachycardia.   I initially met him in 02/2019 for palpitations/tachycardia and labile blood pressures. Noted occasional heart rates peaked in the 150s and then dropped back to his normal in the 50s-60s. Saw fast HR both during/just after activity and also at rest. Stated that BP was "a little low" when his HR was fast. Had seen systolics at 90 or below. Had one event where BP was low/HR was high where he felt like he might pass out. Unclear if he had a syncopal event several months ago--did not fall, was sitting at the table, wife came to check on him and he had leaned back in his chair but he responded immediately--he does not think he lost consciousness. Usually episodes were brief, none longer than 30 min. No clear aggravating or alleviating factors. He hadn't had any events in several months. Couldn't associate the events with any clear changes or other medical issues.   FH: brother died of MI in his 51s. Mother had CHF.  On 04/25/22 he complained of intermittent tachycardic episodes associated with dyspnea for a few weeks. He was advised to try taking half tablets of his 50 mg Losartan. However he continued to have sporadic tachycardia so he was referred to cardiology for further evaluation.  Today,  He denies any palpitations, chest pain, shortness of breath, or peripheral edema. No lightheadedness, headaches, syncope, orthopnea, or PND.  Past Medical History:  Diagnosis Date   BENIGN PROSTATIC  HYPERTROPHY 05/06/2010   COMMON MIGRAINE 07/31/2007   GERD 07/31/2007   HYPERLIPIDEMIA 01/04/2007   HYPERTENSION 01/04/2007   OVERACTIVE BLADDER 03/06/2007   Prostate cancer ([place]) 02/04/2015    Past Surgical History:  Procedure Laterality Date   BIOPSY  12/11/2021   Procedure: BIOPSY;  Surgeon: Jackquline Denmark, MD;  Location: [facility] ENDOSCOPY;  Service: Gastroenterology;;   COLONOSCOPY  2009   ESOPHAGOGASTRODUODENOSCOPY (EGD) WITH PROPOFOL N/A 12/11/2021   Procedure: ESOPHAGOGASTRODUODENOSCOPY (EGD) WITH PROPOFOL;  Surgeon: Jackquline Denmark, MD;  Location: [place];  Service: Gastroenterology;  Laterality: N/A;    Current Medications: Current Outpatient Medications on File Prior to Visit  Medication Sig   albuterol (VENTOLIN HFA) 108 (90 Base) MCG/ACT inhaler Inhale 2 puffs into the lungs every 6 (six) hours as needed for wheezing or shortness of breath.   aspirin 81 MG tablet Take 1 tablet (81 mg total) by mouth daily.   atorvastatin (LIPITOR) 40 MG tablet Take 1 tablet (40 mg total) by mouth daily.   benzonatate (TESSALON) 100 MG capsule Take 1 capsule (100 mg total) by mouth every 8 (eight) hours.   Cholecalciferol 50 MCG (2000 UT) TABS 1 tab by mouth once daily (Patient taking differently: Take 2,000 Units by mouth daily.)   dorzolamide-timolol (COSOPT) 22.3-6.8 MG/ML ophthalmic solution Place 1 drop into both eyes 2 (two) times daily.   doxycycline (VIBRA-TABS) 100 MG tablet Take 1 tablet (100 mg total) by mouth 2 (two) times daily.   GEMTESA 75 MG  TABS Take 75 mg by mouth daily. SAMPLES   ketorolac (ACULAR) 0.5 % ophthalmic solution Place 1 drop into the left eye 4 (four) times daily.   latanoprost (XALATAN) 0.005 % ophthalmic solution Place 1 drop into both eyes at bedtime.   losartan (COZAAR) 50 MG tablet Take 1 tablet (50 mg total) by mouth daily.   polyethylene glycol (MIRALAX / GLYCOLAX) packet Take 17 g by mouth as needed for mild constipation.   potassium chloride (KLOR-CON 10)  10 MEQ tablet Take 1 tablet (10 mEq total) by mouth daily.   predniSONE (DELTASONE) 10 MG tablet 3 tabs by mouth per day for 3 days,2tabs per day for 3 days,1tab per day for 3 days   sildenafil (REVATIO) 20 MG tablet TAKE THREE TABLETS BY MOUTH AS NEEDED (Patient taking differently: Take 60 mg by mouth as needed (erectile dysfunction).)   SYSTANE ULTRA 0.4-0.3 % SOLN Place 1 drop into both eyes daily as needed (dryness).   No current facility-administered medications on file prior to visit.     Allergies:   Beta adrenergic blockers, Celebrex [celecoxib], Crestor [rosuvastatin], Detrol [tolterodine], Ditropan [oxybutynin], Hydralazine, Norvasc [amlodipine besylate], Rapaflo [silodosin], Tadalafil, Tamsulosin, and Toprol xl [metoprolol]   Social History   Tobacco Use   Smoking status: Never   Smokeless tobacco: Never  Vaping Use   Vaping Use: Never used  Substance Use Topics   Alcohol use: No    Alcohol/week: 0.0 standard drinks of alcohol   Drug use: No    Family History: family history includes Heart disease (age of onset: 59) in his brother; Lung cancer (age of onset: 5) in his father. There is no history of Colon cancer, Esophageal cancer, Rectal cancer, or Stomach cancer.  ROS:   Please see the history of present illness.  Additional pertinent ROS: Constitutional: Negative for chills, fever, night sweats, unintentional weight loss  HENT: Negative for ear pain and hearing loss.   Eyes: Negative for loss of vision and eye pain.  Respiratory: Negative for cough, sputum, wheezing.   Cardiovascular: See HPI. Gastrointestinal: Negative for abdominal pain, melena, and hematochezia.  Genitourinary: Negative for dysuria and hematuria.  Musculoskeletal: Negative for falls and myalgias.  Skin: Negative for itching and rash.  Neurological: Negative for focal weakness, focal sensory changes and loss of consciousness.  Endo/Heme/Allergies: Does not bruise/bleed easily.      EKGs/Labs/Other Studies Reviewed:    The following studies were reviewed today:  CTA  Abdomen/Pelvis  12/10/2021: IMPRESSION: VASCULAR   No evidence of active GI bleed. There is enhancing area in the gastric antrum which may reflect a vascular malformation and a potential source of bleeding. Consider correlation with endoscopy.   NON-VASCULAR   Diverticulosis without evidence of acute diverticulitis.  Echo 01/23/19  1. Left ventricular ejection fraction, by visual estimation, is 60 to 65%. The left ventricle has normal function. Left ventricular septal wall thickness was normal. Normal left ventricular posterior wall thickness. There is no left ventricular  hypertrophy.  2. Left ventricular diastolic Doppler parameters are consistent with pseudonormalization pattern of LV diastolic filling.  3. GLS = -19.0%.  4. Global right ventricle has normal systolic function.The right ventricular size is normal. No increase in right ventricular wall thickness.  5. Left atrial size was normal.  6. Right atrial size was normal.  7. The mitral valve is normal in structure. Trace mitral valve regurgitation.  8. The tricuspid valve is normal in structure. Tricuspid valve regurgitation was not visualized by color flow Doppler.  9. The aortic valve The aortic valve is tricuspid Aortic valve regurgitation was not visualized by color flow Doppler. Mild aortic valve sclerosis without stenosis. 10. The pulmonic valve was normal in structure. Pulmonic valve regurgitation is not visualized by color flow Doppler. 11. The atrial septum is grossly normal.   MPI 12/02/13 Low risk stress nuclear study with a very small reversible defect in the mid and basal inferior wall which is most likely related to variations in diaphragmatic attenunation but cannot rule out a very small area of ischemia..   LV Ejection Fraction: 57%.  LV Wall Motion:  NL LV Function; NL Wall Motion  EKG:  EKG is personally reviewed.    05/17/2022:  *** 02/25/2019:  SR with 1st degree AV block, nonspecific TWI lateral leads, unchanged from prior   Recent Labs: 07/20/2021: TSH 1.45 12/10/2021: ALT 18 12/22/2021: BUN 14; Creatinine, Ser 1.23; Hemoglobin 11.9; Platelets 314.0; Potassium 3.9; Sodium 138   Recent Lipid Panel    Component Value Date/Time   CHOL 151 10/17/2021 0921   TRIG 66.0 10/17/2021 0921   HDL 57.40 10/17/2021 0921   CHOLHDL 3 10/17/2021 0921   VLDL 13.2 10/17/2021 0921   LDLCALC 80 10/17/2021 0921   LDLDIRECT 139.2 10/29/2012 1421    Physical Exam:    VS:  There were no vitals taken for this visit.    Wt Readings from Last 3 Encounters:  04/21/22 199 lb (90.3 kg)  03/28/22 198 lb (89.8 kg)  02/09/22 193 lb 2 oz (87.6 kg)    GEN: Well nourished, well developed in no acute distress HEENT: Normal, moist mucous membranes NECK: No JVD CARDIAC: regular rhythm, normal S1 and S2, no rubs or gallops. No murmur. VASCULAR: Radial and DP pulses 2+ bilaterally. No carotid bruits RESPIRATORY:  Clear to auscultation without rales, wheezing or rhonchi  ABDOMEN: Soft, non-tender, non-distended MUSCULOSKELETAL:  Ambulates independently SKIN: Warm and dry, no edema NEUROLOGIC:  Alert and oriented x 3. No focal neuro deficits noted. PSYCHIATRIC:  Normal affect    ASSESSMENT:    No diagnosis found. PLAN:    Intermittent tachycardia, palpitations: this has since resolved. We discussed at length options for monitoring/management -as symptoms now resolved, will hold on cardiac monitor, as it is likely to be low yield. -if symptoms recur, he will call, and we will send a monitor to his house -counseled on red flag warning signs -no clear aggravating/alleviating factors -ECG unremarkable today -recent echo without significant abnormalities -prior MPI 2015, but no chest pain/anginal symptoms   Labile BP: initially very elevated on arrival (170s), improved with resting. Reports normal BP at home -with  history of recent hypotension, will not overtreat. Continue home BP measurements. Would be helpful to have a log, bring cuff to office on next visit -continue benazepril 20 mg daily   Hypercholesterolemia: elevated ASCVD risk -LDL 95 09/2018, at goal (<100) -continue atorvastatin 20 mg daily, aspirin 81 mg daily (this is optional, but with hyperglycemia/nearing diabetes, reasonable to continue as he reports no bleeding issues)  Cardiac risk counseling and prevention recommendations: -recommend heart healthy/Mediterranean diet, with whole grains, fruits, vegetable, fish, lean meats, nuts, and olive oil. Limit salt. -recommend moderate walking, 3-5 times/week for 30-50 minutes each session. Aim for at least 150 minutes.week. Goal should be pace of 3 miles/hours, or walking 1.5 miles in 30 minutes -recommend avoidance of tobacco products. Avoid excess alcohol. -ASCVD risk score: The 10-year ASCVD risk score (Arnett DK, et al., 2019) is: 21.3%   Values used  to calculate the score:     Age: 72 years     Sex: Male     Is Non-Hispanic African American: Yes     Diabetic: No     Tobacco smoker: No     Systolic Blood Pressure: 332 mmHg     Is BP treated: Yes     HDL Cholesterol: 57.4 mg/dL     Total Cholesterol: 151 mg/dL    Plan for follow up: ***3 months or sooner as needed.  Buford Dresser, MD, PhD, [facility]    Medication Adjustments/Labs and Tests Ordered: Current medicines are reviewed at length with the patient today.  Concerns regarding medicines are outlined above.   No orders of the defined types were placed in this encounter.  No orders of the defined types were placed in this encounter.  There are no Patient Instructions on file for this visit.   I,Mathew Stumpf,acting as a Education administrator for [employer], MD.,have documented all relevant documentation on the behalf of Buford Dresser, MD,as directed by  Buford Dresser, MD while in  the presence of Buford Dresser, MD.  ***  Signed, Buford Dresser, MD PhD 05/15/2022 1:46 PM    [place]

## 2022-05-17 ENCOUNTER — Encounter (HOSPITAL_BASED_OUTPATIENT_CLINIC_OR_DEPARTMENT_OTHER): Payer: Self-pay | Admitting: Cardiology

## 2022-05-17 ENCOUNTER — Other Ambulatory Visit (INDEPENDENT_AMBULATORY_CARE_PROVIDER_SITE_OTHER): Payer: Medicare Other

## 2022-05-17 ENCOUNTER — Ambulatory Visit (INDEPENDENT_AMBULATORY_CARE_PROVIDER_SITE_OTHER): Payer: Medicare Other | Admitting: Cardiology

## 2022-05-17 VITALS — BP 134/100 | HR 128 | Ht 67.0 in | Wt 203.7 lb

## 2022-05-17 DIAGNOSIS — I10 Essential (primary) hypertension: Secondary | ICD-10-CM | POA: Diagnosis not present

## 2022-05-17 DIAGNOSIS — Z9189 Other specified personal risk factors, not elsewhere classified: Secondary | ICD-10-CM | POA: Diagnosis not present

## 2022-05-17 DIAGNOSIS — I479 Paroxysmal tachycardia, unspecified: Secondary | ICD-10-CM

## 2022-05-17 DIAGNOSIS — R002 Palpitations: Secondary | ICD-10-CM

## 2022-05-17 DIAGNOSIS — E7800 Pure hypercholesterolemia, unspecified: Secondary | ICD-10-CM | POA: Diagnosis not present

## 2022-05-17 NOTE — Patient Instructions (Signed)
Medication Instructions:  None  *If you need a refill on your cardiac medications before your next appointment, please call your pharmacy*   Lab Work: None   Testing/Procedures: None   Follow-Up: At [facility], you and your health needs are our priority.  As part of our continuing mission to provide you with exceptional heart care, we have created designated Provider Care Teams.  These Care Teams include your primary Cardiologist (physician) and Advanced Practice Providers (APPs -  Physician Assistants and Nurse Practitioners) who all work together to provide you with the care you need, when you need it.  We recommend signing up for the patient portal called "MyChart".  Sign up information is provided on this After Visit Summary.  MyChart is used to connect with patients for Virtual Visits (Telemedicine).  Patients are able to view lab/test results, encounter notes, upcoming appointments, etc.  Non-urgent messages can be sent to your provider as well.   To learn more about what you can do with MyChart, go to [email or web address].    Your next appointment:   4  to 5 week(s)  The format for your next appointment:   In Person  Provider:   Buford Dresser, MD    Other Instructions Your physician has recommended that you wear a Zio monitor.   This monitor is a medical device that records the heart's electrical activity. Doctors most often use these monitors to diagnose arrhythmias. Arrhythmias are problems with the speed or rhythm of the heartbeat. The monitor is a small device applied to your chest. You can wear one while you do your normal daily activities. While wearing this monitor if you have any symptoms to push the button and record what you felt. Once you have worn this monitor for the period of time provider prescribed (Usually 14 days), you will return the monitor device in the postage paid box. Once it is returned they will download the data collected and  provide [place] with a report which the provider will then review and we will call you with those results. Important tips:  Avoid showering during the first 24 hours of wearing the monitor. Avoid excessive sweating to help maximize wear time. Do not submerge the device, no hot tubs, and no swimming pools. Keep any lotions or oils away from the patch. After 24 hours you may shower with the patch on. Take brief showers with your back facing the shower head.  Do not remove patch once it has been placed because that will interrupt data and decrease adhesive wear time. Push the button when you have any symptoms and write down what you were feeling. Once you have completed wearing your monitor, remove and place into box which has postage paid and place in your outgoing mailbox.  If for some reason you have misplaced your box then call our office and we can provide another box and/or mail it off for you.

## 2022-05-18 ENCOUNTER — Encounter (HOSPITAL_BASED_OUTPATIENT_CLINIC_OR_DEPARTMENT_OTHER): Payer: Self-pay | Admitting: Cardiology

## 2022-05-22 NOTE — Addendum Note (Signed)
Addended by: Gerald Stabs on: 05/22/2022 10:11 AM   Modules accepted: Orders

## 2022-05-29 DIAGNOSIS — R002 Palpitations: Secondary | ICD-10-CM | POA: Diagnosis not present

## 2022-05-29 DIAGNOSIS — I479 Paroxysmal tachycardia, unspecified: Secondary | ICD-10-CM | POA: Diagnosis not present

## 2022-05-30 ENCOUNTER — Telehealth: Payer: Self-pay | Admitting: Cardiology

## 2022-05-30 DIAGNOSIS — I471 Supraventricular tachycardia, unspecified: Secondary | ICD-10-CM

## 2022-05-30 DIAGNOSIS — R000 Tachycardia, unspecified: Secondary | ICD-10-CM

## 2022-05-30 DIAGNOSIS — R0609 Other forms of dyspnea: Secondary | ICD-10-CM

## 2022-05-30 NOTE — Telephone Encounter (Signed)
Spoke with Raquel Sarna from [place]. ZIO XT worn. 1/10-1/17. Revealed 6,409 runs of SVT. Some symptomatic with triggered event or diary event. Fastest 39 minutes and 15 sec max rate 222 bpm average rate 162 bpm (this was also longest run)   Loel Dubonnet, NP

## 2022-05-30 NOTE — Telephone Encounter (Signed)
Attempted to contact patient, no answer, Left message for patient to call back   Updated recommendations below,   "Beta blocker on allergy list with s/e of bradycardia, dizziness. Low suspicion low dose would cause significant bradycardia given average heart rate 81 bpm.    Can instead, start low dose Diltiazem '120mg'$  daily. This is a calcium channel blocker that helps to relax the heart and reduce episodes of SVT.    Loel Dubonnet, NP"

## 2022-05-30 NOTE — Telephone Encounter (Signed)
Calling with abnormal cardiac results. Call transferred

## 2022-05-30 NOTE — Telephone Encounter (Signed)
Beta blocker on allergy list with s/e of bradycardia, dizziness. Low suspicion low dose would cause significant bradycardia given average heart rate 81 bpm.   Can instead, start low dose Diltiazem '120mg'$  daily. This is a calcium channel blocker that helps to relax the heart and reduce episodes of SVT.   Loel Dubonnet, NP

## 2022-05-30 NOTE — Telephone Encounter (Signed)
Chart and ZIO Suite reviewed.   Preliminary monitor report detailed below. Report finalized and uploaded to Epic by Daphene Jaeger, RN.   Patient had a min HR of 35 bpm, max HR of 222 bpm, and avg HR of 81 bpm. Predominant underlying rhythm was Sinus Rhythm. First Degree AV Block was present. 3013 Supraventricular Tachycardia runs occurred, the run with the fastest interval lasting 39 mins 15 secs with a max rate of 222 bpm (avg 162 bpm); the run with the fastest interval was also the longest. Supraventricular Tachycardia was detected within +/- 45 seconds of symptomatic patient event(s). Isolated SVEs were occasional (4.7%, [id number]), SVE Couplets were rare (<1.0%, 601), and SVE Triplets were rare (<1.0%, 802). Isolated VEs were rare (<1.0%), VE Couplets were rare (<1.0%), and no VE Triplets were present. MD notification criteria for Supraventricular Tachycardia met - report posted prior to notification per account request (AV).  Of note, minimum rate of SB 35 bpm was at 4:19AM on 05/18/22 presumably during sleep. Longest episode SVT 39 minutes. 4 triggered events associated with SVT.   Given significant SVT burden plan on the following (discussed with Dr. Harrell Gave) Update labs including thyroid panel, BMP, CBC, magnesium. Echocardiogram to rule out structural abnormalities contributing to SVT.  Start Metporolol Succinate '25mg'$  daily. Refer to electrophysiology.  Loel Dubonnet, NP

## 2022-05-30 NOTE — Telephone Encounter (Signed)
Orders pended, went to call patient when noted that patient has allergy to Beta blockers and Metoprolol listed in chart. Will route back to provider for clarification before calling patient.       "Given significant SVT burden plan on the following (discussed with Dr. Harrell Gave) Update labs including thyroid panel, BMP, CBC, magnesium. Echocardiogram to rule out structural abnormalities contributing to SVT.  Start Metporolol Succinate '25mg'$  daily. Refer to electrophysiology.   Loel Dubonnet, NP"

## 2022-05-31 ENCOUNTER — Emergency Department (HOSPITAL_COMMUNITY)
Admission: EM | Admit: 2022-05-31 | Discharge: 2022-05-31 | Disposition: A | Discharge status: Home / Self Care | Payer: Medicare Other | Attending: Emergency Medicine | Admitting: Emergency Medicine

## 2022-05-31 ENCOUNTER — Emergency Department (HOSPITAL_COMMUNITY): Payer: Medicare Other

## 2022-05-31 ENCOUNTER — Ambulatory Visit
Admission: EM | Admit: 2022-05-31 | Discharge: 2022-05-31 | Disposition: A | Discharge status: Home / Self Care | Payer: Medicare Other | Attending: Physician Assistant | Admitting: Physician Assistant

## 2022-05-31 ENCOUNTER — Encounter (HOSPITAL_COMMUNITY): Payer: Self-pay

## 2022-05-31 DIAGNOSIS — R059 Cough, unspecified: Secondary | ICD-10-CM | POA: Diagnosis not present

## 2022-05-31 DIAGNOSIS — Z79899 Other long term (current) drug therapy: Secondary | ICD-10-CM | POA: Diagnosis not present

## 2022-05-31 DIAGNOSIS — R079 Chest pain, unspecified: Secondary | ICD-10-CM | POA: Diagnosis not present

## 2022-05-31 DIAGNOSIS — R000 Tachycardia, unspecified: Secondary | ICD-10-CM

## 2022-05-31 DIAGNOSIS — Z7982 Long term (current) use of aspirin: Secondary | ICD-10-CM | POA: Insufficient documentation

## 2022-05-31 DIAGNOSIS — M545 Low back pain, unspecified: Secondary | ICD-10-CM | POA: Diagnosis not present

## 2022-05-31 DIAGNOSIS — I10 Essential (primary) hypertension: Secondary | ICD-10-CM | POA: Diagnosis not present

## 2022-05-31 LAB — CBC
HCT: 42 % (ref 39.0–52.0)
Hemoglobin: 13.1 g/dL (ref 13.0–17.0)
MCH: 26.8 pg (ref 26.0–34.0)
MCHC: 31.2 g/dL (ref 30.0–36.0)
MCV: 85.9 fL (ref 80.0–100.0)
Platelets: 245 10*3/uL (ref 150–400)
RBC: 4.89 MIL/uL (ref 4.22–5.81)
RDW: 17 % — ABNORMAL HIGH (ref 11.5–15.5)
WBC: 6.6 10*3/uL (ref 4.0–10.5)
nRBC: 0 % (ref 0.0–0.2)

## 2022-05-31 LAB — BASIC METABOLIC PANEL
Anion gap: 11 (ref 5–15)
BUN: 12 mg/dL (ref 8–23)
CO2: 24 mmol/L (ref 22–32)
Calcium: 8.7 mg/dL — ABNORMAL LOW (ref 8.9–10.3)
Chloride: 104 mmol/L (ref 98–111)
Creatinine, Ser: 0.98 mg/dL (ref 0.61–1.24)
GFR, Estimated: >60 mL/min (ref >60)
Glucose, Bld: 98 mg/dL (ref 70–99)
Potassium: 3.1 mmol/L — ABNORMAL LOW (ref 3.5–5.1)
Sodium: 139 mmol/L (ref 135–145)

## 2022-05-31 LAB — TROPONIN I (HIGH SENSITIVITY)
Troponin I (High Sensitivity): 13 ng/L (ref <18)
Troponin I (High Sensitivity): 14 ng/L (ref <18)

## 2022-05-31 LAB — TSH: TSH: 1.059 u[IU]/mL (ref 0.350–4.500)

## 2022-05-31 MED ORDER — DILTIAZEM HCL ER COATED BEADS 120 MG PO CP24: 120.0000 mg | ORAL_CAPSULE | Freq: Every day | ORAL | 0 refills | Status: DC

## 2022-05-31 MED ORDER — DILTIAZEM HCL ER COATED BEADS 120 MG PO CP24
120.0000 mg | ORAL_CAPSULE | Freq: Once | ORAL | Status: AC
  Administered 2022-05-31: 120 mg via ORAL
  Filled 2022-05-31: qty 1

## 2022-05-31 NOTE — ED Notes (Signed)
Pt ambulated to treatment room from [place] w/steady gait, observed by this RN, pt alert, NAD noted, pt able to lay on stretcher by self, placed on cardiac monitoring.

## 2022-05-31 NOTE — ED Notes (Signed)
Patient is being discharged from the Urgent Care and sent to the Emergency Department via self . Per Wells Guiles, patient is in need of higher level of care due to abnormal ECG. Patient is aware and verbalizes understanding of plan of care.  Vitals:   05/31/22 1058 05/31/22 1100  BP: (!) 164/116 (!) 146/93  Pulse: 75   Resp: 18   Temp: 98.4 F (36.9 C)   SpO2: 97%

## 2022-05-31 NOTE — ED Triage Notes (Signed)
Pt c/o lower back pain onset ~ 3-4 days ago. Denies known trauma/injury/exertion. States he was coughing when he first felt the pain. Pain worse at night or with sitting.   Of note, patient heart rate ranging from 75-130 BPM in triage.

## 2022-05-31 NOTE — ED Provider Triage Note (Signed)
Emergency Medicine Provider Triage Evaluation Note  [name] [name] , a 72 y.o. male  was evaluated in triage.  Pt complains of chief complaint of low back pain.  He pertinently denies chest pain, shortness of breath or any palpitations. States that he coughed while in bed 3 days ago in his low back radiating into his right buttocks has been hurting.  He denies fevers or chills, nausea vomiting, syncope shortness of breath.  Otherwise ambulatory tolerating p.o. intake.. Denies cardiac history. Review of Systems  Positive: Low back pain Negative: Chest pain shortness of breath  Physical Exam  BP (!) 155/113   Pulse (!) 136   Temp 97.8 F (36.6 C) (Oral)   Resp 18   Ht '5\' 7"'$  (1.702 m)   Wt 89.8 kg   SpO2 98%   BMI 31.01 kg/m  Gen:   Awake, no distress   Resp:  Normal effort  MSK:   Moves extremities without difficulty  Other:    Medical Decision Making  Medically screening exam initiated at 2:26 PM.  Appropriate orders placed.  [name] [name] was informed that the remainder of the evaluation will be completed by another provider, this initial triage assessment does not replace that evaluation, and the importance of remaining in the ED until their evaluation is complete.     [name] Sciara, MD 05/31/22 1428

## 2022-05-31 NOTE — ED Triage Notes (Signed)
Pt arrives via POV. Pt was sent to ED from UC. Pt initially went UC for lower back pain that has been present for the past 3 days. No injury. Pt reports his HR was fluctuating from the 70s-130s while at [facility], that is why he was told to come to the ED. Pt denies any associated symptoms. AxOx4. Resting HR in triage is currently 137.

## 2022-05-31 NOTE — ED Provider Notes (Addendum)
[name] Provider Note   CSN: [id number] Arrival date & time: 05/31/22  1317     History  Chief Complaint  Patient presents with   Back Pain    [name] [name] is a 72 y.o. male with past medical history of hypertension, hyperlipidemia, GERD, BPH who presents with back pain and tachycardia.  Patient initially presented to an urgent care center earlier today, complaining of back pain and was found to be intermittently tachycardic.  Patient's back pain started 3 to 4 days ago.  He states this started after he just called, describes it as sore and aching pain.  The pain is located in the lumbar spine and radiates to the right.  It is not very tender to palpation.  He denies any red flag symptoms such as recent fevers, or neurological deficits.  While patient was being evaluated at the urgent care, he was found to be intermittently tachycardic with heart rates jumping from 75-1 50 at the max.  He is been currently in the process of being worked up by cardiology for this, and worn a Holter monitor for 7 days from January 10 January 17 which revealed very significant amount of SVT rhythms that did not last very long each.  He currently denies any chest pain, feelings of palpitation, dyspnea, leg swelling.   Back Pain      Home Medications Prior to Admission medications   Medication Sig Start Date End Date Taking? Authorizing Provider  diltiazem (CARDIZEM CD) 120 MG 24 hr capsule Take 1 capsule (120 mg total) by mouth daily. 05/31/22  Yes Juaquina Machnik, Marlene Lard, MD  aspirin 81 MG tablet Take 1 tablet (81 mg total) by mouth daily. 12/18/21   Mariel Aloe, MD  atorvastatin (LIPITOR) 40 MG tablet Take 1 tablet (40 mg total) by mouth daily. 07/20/21   Biagio Borg, MD  benzonatate (TESSALON) 100 MG capsule Take 1 capsule (100 mg total) by mouth every 8 (eight) hours. 03/25/22   Raspet, Derry Skill, PA-C  Cholecalciferol 50 MCG (2000 UT) TABS 1 tab by mouth  once daily Patient taking differently: Take 2,000 Units by mouth daily. 07/20/21   Biagio Borg, MD  dorzolamide-timolol (COSOPT) 22.3-6.8 MG/ML ophthalmic solution Place 1 drop into both eyes 2 (two) times daily. 09/24/20   [provider]  doxycycline (VIBRA-TABS) 100 MG tablet Take 1 tablet (100 mg total) by mouth 2 (two) times daily. 04/21/22   Biagio Borg, MD  GEMTESA 75 MG TABS Take 75 mg by mouth daily. SAMPLES 11/29/21   [provider]  ketorolac (ACULAR) 0.5 % ophthalmic solution Place 1 drop into the left eye 4 (four) times daily. 12/16/21   [provider]  latanoprost (XALATAN) 0.005 % ophthalmic solution Place 1 drop into both eyes at bedtime. 11/09/17   [provider]  losartan (COZAAR) 50 MG tablet Take 1 tablet (50 mg total) by mouth daily. 04/21/22   Biagio Borg, MD  polyethylene glycol [facility] / Floria Raveling) packet Take 17 g by mouth as needed for mild constipation.    [provider]  potassium chloride (KLOR-CON 10) 10 MEQ tablet Take 1 tablet (10 mEq total) by mouth daily. 07/20/21   Biagio Borg, MD  sildenafil (REVATIO) 20 MG tablet TAKE THREE TABLETS BY MOUTH AS NEEDED Patient taking differently: Take 60 mg by mouth as needed (erectile dysfunction). 04/26/21   Biagio Borg, MD  SYSTANE ULTRA 0.4-0.3 % SOLN Place 1  drop into both eyes daily as needed (dryness). 08/14/19   [provider]      Allergies    Beta adrenergic blockers, Celebrex [celecoxib], Crestor [rosuvastatin], Detrol [tolterodine], Ditropan [oxybutynin], Hydralazine, Norvasc [amlodipine besylate], Rapaflo [silodosin], Tadalafil, Tamsulosin, and Toprol xl [metoprolol]    Review of Systems   Review of Systems  Musculoskeletal:  Positive for back pain.    Physical Exam Updated Vital Signs BP (!) 161/101   Pulse 60   Temp 98.1 F (36.7 C) (Oral)   Resp (!) 21   Ht '5\' 7"'$  (1.702 m)   Wt 89.8 kg   SpO2 99%   BMI 31.01 kg/m  Physical  Exam Constitutional:      General: He is not in acute distress.    Appearance: Normal appearance.  Cardiovascular:     Heart sounds: Normal heart sounds.     Comments: Pt started with normal rate, but quickly became tachycardic during auscultation. Normal rhythm.  Pulmonary:     Effort: Pulmonary effort is normal.     Breath sounds: Normal breath sounds and air entry.  Musculoskeletal:     Lumbar back: No swelling, edema or bony tenderness. No scoliosis.     Comments: Mild Tenderness to palpation of lumbar spine  Neurological:     Mental Status: He is alert.     ED Results / Procedures / Treatments   Labs (all labs ordered are listed, but only abnormal results are displayed) Labs Reviewed  BASIC METABOLIC PANEL - Abnormal; Notable for the following components:      Result Value   Potassium 3.1 (*)    Calcium 8.7 (*)    All other components within normal limits  CBC - Abnormal; Notable for the following components:   RDW 17.0 (*)    All other components within normal limits  TSH  TROPONIN I (HIGH SENSITIVITY)  TROPONIN I (HIGH SENSITIVITY)    EKG EKG Interpretation  Date/Time:  Wednesday May 31 2022 22:10:32 EST Ventricular Rate:  60 PR Interval:  301 QRS Duration: 88 QT Interval:  425 QTC Calculation: 425 R Axis:   47 Text Interpretation: Sinus rhythm Atrial premature complex Prolonged PR interval Probable left atrial enlargement Borderline T wave abnormalities tachycardia resolved Confirmed by Blanchie Dessert [phone]) on 05/31/2022 10:16:16 PM  Radiology DG Lumbar Spine Complete  Result Date: 05/31/2022 CLINICAL DATA:  Low back pain. EXAM: LUMBAR SPINE - COMPLETE 4+ VIEW COMPARISON:  Lumbar spine radiographs 04/19/2017. Abdominopelvic CT 12/10/2021. FINDINGS: There are 5 lumbar type vertebral bodies. The alignment is stable. The disc spaces are preserved. There is no evidence of acute fracture or pars defect. Mild facet degenerative changes are present. There  are chronic expansile lucent lesions within the left 12th rib and within the T12, L1 and L2 vertebral bodies which are grossly unchanged. These have been demonstrated on prior CTs dating back to 07/17/2012 and are considered benign based on stability. Fiducial clips noted in the prostate gland. IMPRESSION: 1. No acute findings or significant spondylosis. Stable alignment. 2. Stable benign-appearing lucent lesions in the lower thoracic and upper lumbar spine. Electronically Signed   By: Richardean Sale M.D.   On: 05/31/2022 14:59   DG Chest 2 View  Result Date: 05/31/2022 CLINICAL DATA:  Low back pain.  Cough. EXAM: CHEST - 2 VIEW COMPARISON:  Radiographs 03/25/2022 and 06/12/2018. FINDINGS: The heart size and mediastinal contours are stable. The lungs are clear. There is no pleural effusion or pneumothorax. No acute osseous findings are identified.  Stable degenerative changes in the thoracic spine. IMPRESSION: Stable chest. No active cardiopulmonary process. Electronically Signed   By: Richardean Sale M.D.   On: 05/31/2022 14:54    Procedures Procedures    Medications Ordered in ED Medications  diltiazem (CARDIZEM CD) 24 hr capsule 120 mg (120 mg Oral Given 05/31/22 2029)    ED Course/ Medical Decision Making/ A&P                             Medical Decision Making Amount and/or Complexity of Data Reviewed Labs: ordered. Decision-making details documented in ED Course. Radiology:  Decision-making details documented in ED Course. ECG/medicine tests:  Decision-making details documented in ED Course.  Risk Prescription drug management.   Patient was recommended to come to the emergency department from urgent care due to intermittent tachycardia.  Upon chart review, Holter monitor showed significant amount of SVT rhythm.   On my exam, on auscultation could see his heart rate increased from 72-130. Troponins flat x2  He is in the middle of getting evaluated by cardiology for this, and the  recommendations were to start Cardizem 120 mg in the outpatient setting, however patient did not get the messages from the office.  Chest x-ray done earlier today in urgent care showed no acute processes.  Will give patient 1 dose of Cardizem CD '120mg'$ , as well as check a TSH.  Patient's heart rates improved to 60-65 range after Cardizem was given.  Repeat EKG showed sinus rhythm.  Patient denies any symptoms of chest pain, dizziness, or palpitations after given Cardizem.  Will give patient 1 month supply of Cardizem CD and he will follow-up with his cardiologist.  Patient will need follow-up on his TSH as soon as results are available.  Patient also complaining of back pain.  Pain is located lumbar spine radiating to the right side.  No red flag symptoms are present such as recent fevers and neurologic deficits.  MRI done earlier today of lumbar spine shows no acute findings or spondylosis.  Will recommend patient treat with acetaminophen as needed.   Final Clinical Impression(s) / ED Diagnoses Final diagnoses:  Tachycardia    Rx / DC Orders ED Discharge Orders          Ordered    diltiazem (CARDIZEM CD) 120 MG 24 hr capsule  Daily        05/31/22 2218              NooruddinMarlene Lard, MD 05/31/22 2226    Drucie Opitz, MD 05/31/22 2703    Blanchie Dessert, MD 06/03/22 5009

## 2022-05-31 NOTE — ED Provider Notes (Signed)
[facility]    CSN: [id number] Arrival date & time: 05/31/22  0955      History   Chief Complaint Chief Complaint  Patient presents with   Back Pain    HPI [name] [name] is a 72 y.o. male.   Patient here today for evaluation of low back pain. He has not had any recent injury. He states that he has pain to his lower back diffusely which seems to be worse after he has sat for longer periods of time.   He is noted to have fluctuating heart rate on initial triage. EKG with sustained tachycardia. Chart review reveals that most recent home study had numerous runs of unexplained SVT. Patient reports he thinks his elevated heart rate is a side effect of his blood pressure medication because he did take it this morning.   The history is provided by the patient.  Back Pain Associated symptoms: no chest pain and no fever     Past Medical History:  Diagnosis Date   BENIGN PROSTATIC HYPERTROPHY 05/06/2010   COMMON MIGRAINE 07/31/2007   GERD 07/31/2007   HYPERLIPIDEMIA 01/04/2007   HYPERTENSION 01/04/2007   OVERACTIVE BLADDER 03/06/2007   Prostate cancer ([place]) 02/04/2015    Patient Active Problem List   Diagnosis Date Noted   Dyspnea 04/23/2022   Anxiety 04/23/2022   Asthma 03/28/2022   Hypotension 12/11/2021   Diverticulosis of intestine with bleeding 12/11/2021   GI bleed 12/10/2021   Primary hypertension 12/10/2021   Acute blood loss anemia 12/10/2021   Lower GI bleeding 12/08/2021   Bradycardia 07/20/2021   Myofascial pain syndrome, cervical 04/19/2021   Vitamin D deficiency 09/25/2019   Dysuria 05/15/2019   Palpitation 02/25/2019   Paroxysmal tachycardia ([place]) 02/25/2019   Tachycardia 01/14/2019   Generalized anxiety disorder with panic attacks 09/25/2018   Mid back pain on right side 05/14/2018   Hyperglycemia 03/26/2018   Low back pain 04/19/2017   Lightheadedness 10/26/2016   Eustachian tube dysfunction, right 08/17/2016   Allergic rhinitis  08/17/2016   Erectile dysfunction 71/69/6789   Diastolic dysfunction 38/02/1750   Syncope 04/04/2016   Weakness 03/26/2015   Malignant neoplasm of prostate ([place]) 02/23/2015   Chest pain 10/01/2013   Cough 08/04/2013   Asthmatic bronchitis 07/15/2013   Microhematuria 05/09/2012   Constipation 08/13/2011   Ventral hernia 02/58/5277   Umbilical hernia 82/42/3536   Preventative health care 04/25/2011   Nocturia 06/29/2010   BPH (benign prostatic hyperplasia) 05/06/2010   BACK PAIN 05/06/2010   SKIN LESION 11/30/2008   FREQUENCY, URINARY 11/30/2008   COMMON MIGRAINE 07/31/2007   GERD 07/31/2007   OVERACTIVE BLADDER 03/06/2007   Hypercholesterolemia 01/04/2007   Labile hypertension 01/04/2007    Past Surgical History:  Procedure Laterality Date   BIOPSY  12/11/2021   Procedure: BIOPSY;  Surgeon: Jackquline Denmark, MD;  Location: [facility] ENDOSCOPY;  Service: Gastroenterology;;   COLONOSCOPY  2009   ESOPHAGOGASTRODUODENOSCOPY (EGD) WITH PROPOFOL N/A 12/11/2021   Procedure: ESOPHAGOGASTRODUODENOSCOPY (EGD) WITH PROPOFOL;  Surgeon: Jackquline Denmark, MD;  Location: [facility] ENDOSCOPY;  Service: Gastroenterology;  Laterality: N/A;       Home Medications    Prior to Admission medications   Medication Sig Start Date End Date Taking? Authorizing Provider  aspirin 81 MG tablet Take 1 tablet (81 mg total) by mouth daily. 12/18/21   Mariel Aloe, MD  atorvastatin (LIPITOR) 40 MG tablet Take 1 tablet (40 mg total) by mouth daily. 07/20/21   Biagio Borg, MD  benzonatate (TESSALON) 100  MG capsule Take 1 capsule (100 mg total) by mouth every 8 (eight) hours. 03/25/22   Raspet, Derry Skill, PA-C  Cholecalciferol 50 MCG (2000 UT) TABS 1 tab by mouth once daily Patient taking differently: Take 2,000 Units by mouth daily. 07/20/21   Biagio Borg, MD  dorzolamide-timolol (COSOPT) 22.3-6.8 MG/ML ophthalmic solution Place 1 drop into both eyes 2 (two) times daily. 09/24/20   [provider]  doxycycline  (VIBRA-TABS) 100 MG tablet Take 1 tablet (100 mg total) by mouth 2 (two) times daily. 04/21/22   Biagio Borg, MD  GEMTESA 75 MG TABS Take 75 mg by mouth daily. SAMPLES 11/29/21   [provider]  ketorolac (ACULAR) 0.5 % ophthalmic solution Place 1 drop into the left eye 4 (four) times daily. 12/16/21   [provider]  latanoprost (XALATAN) 0.005 % ophthalmic solution Place 1 drop into both eyes at bedtime. 11/09/17   [provider]  losartan (COZAAR) 50 MG tablet Take 1 tablet (50 mg total) by mouth daily. 04/21/22   Biagio Borg, MD  polyethylene glycol [facility] / Floria Raveling) packet Take 17 g by mouth as needed for mild constipation.    [provider]  potassium chloride (KLOR-CON 10) 10 MEQ tablet Take 1 tablet (10 mEq total) by mouth daily. 07/20/21   Biagio Borg, MD  sildenafil (REVATIO) 20 MG tablet TAKE THREE TABLETS BY MOUTH AS NEEDED Patient taking differently: Take 60 mg by mouth as needed (erectile dysfunction). 04/26/21   Biagio Borg, MD  SYSTANE ULTRA 0.4-0.3 % SOLN Place 1 drop into both eyes daily as needed (dryness). 08/14/19   [provider]    Family History Family History  Problem Relation Age of Onset   Lung cancer Father 55   Heart disease Brother 77       died from MI    Colon cancer Neg Hx    Esophageal cancer Neg Hx    Rectal cancer Neg Hx    Stomach cancer Neg Hx     Social History Social History   Tobacco Use   Smoking status: Never   Smokeless tobacco: Never  Vaping Use   Vaping Use: Never used  Substance Use Topics   Alcohol use: No    Alcohol/week: 0.0 standard drinks of alcohol   Drug use: No     Allergies   Beta adrenergic blockers, Celebrex [celecoxib], Crestor [rosuvastatin], Detrol [tolterodine], Ditropan [oxybutynin], Hydralazine, Norvasc [amlodipine besylate], Rapaflo [silodosin], Tadalafil, Tamsulosin, and Toprol xl [metoprolol]   Review of Systems Review of Systems  Constitutional:   Negative for chills and fever.  Eyes:  Negative for discharge and redness.  Respiratory:  Negative for shortness of breath.   Cardiovascular:  Positive for palpitations. Negative for chest pain.  Musculoskeletal:  Positive for back pain.  Neurological:  Negative for light-headedness.     Physical Exam Triage Vital Signs ED Triage Vitals [05/31/22 1058]  Enc Vitals Group     BP (!) 164/116     Pulse Rate 75     Resp 18     Temp 98.4 F (36.9 C)     Temp Source Oral     SpO2 97 %     Weight      Height      Head Circumference      Peak Flow      Pain Score 6     Pain Loc      Pain Edu?  Excl. in [place]?    No data found.  Updated Vital Signs BP (!) 146/93   Pulse 75   Temp 98.4 F (36.9 C) (Oral)   Resp 18   SpO2 97%   Physical Exam Vitals and nursing note reviewed.  Constitutional:      General: He is not in acute distress.    Appearance: Normal appearance. He is not ill-appearing.  HENT:     Head: Normocephalic and atraumatic.     Nose: Nose normal. No congestion or rhinorrhea.  Eyes:     Conjunctiva/sclera: Conjunctivae normal.  Cardiovascular:     Rate and Rhythm: Tachycardia present.  Pulmonary:     Effort: Pulmonary effort is normal. No respiratory distress.     Breath sounds: No wheezing, rhonchi or rales.  Neurological:     Mental Status: He is alert.  Psychiatric:        Mood and Affect: Mood normal.        Behavior: Behavior normal.      UC Treatments / Results  Labs (all labs ordered are listed, but only abnormal results are displayed) Labs Reviewed - No data to display  EKG   Radiology No results found.  Procedures Procedures (including critical care time)  Medications Ordered in UC Medications - No data to display  Initial Impression / Assessment and Plan / UC Course  I have reviewed the triage vital signs and the nursing notes.  Pertinent labs & imaging results that were available during my care of the patient were  reviewed by me and considered in my medical decision making (see chart for details).    Given significant tachycardia recommended further evaluation in the ED for stat labs, etc. Will defer further work up of back pain as well. Patient feels stable to transport himself via POV.   Final Clinical Impressions(s) / UC Diagnoses   Final diagnoses:  Tachycardia  Acute bilateral low back pain, unspecified whether sciatica present   Discharge Instructions   None    ED Prescriptions   None    PDMP not reviewed this encounter.   Francene Finders, PA-C 05/31/22 1130

## 2022-05-31 NOTE — ED Notes (Signed)
Dr. Marlene Lard Nooruddin is at the bedside and aware of pt's HTN and HR 120s. No verbal orders at this time

## 2022-05-31 NOTE — Discharge Instructions (Signed)
It was a pleasure meeting you. You came to the emergency department for some back pain as well as a high heart rate. For the back pain, I recommend taking Tylenol as necessary. For the high heart rate, I am sending you a medication called Diltiazem to your pharmacy, please take them once a day. This is only a month supply, so please follow up with your primary care physician as soon as possible, or your heart doctor. If you have symptoms of dizziness, chest pain, please come back to the ED. Take Care!

## 2022-06-01 NOTE — Telephone Encounter (Signed)
Returned call to patient, he was seen in the ED last night for tachycardia. They ran a TSH and started patient on Dilt '120mg'$  daily. Advised patient that I would order the rest of the labs and testing. Labs mailed to patient. Note routed to scheduling team for assistance with scheduling.            "Given significant SVT burden plan on the following (discussed with Dr. Harrell Gave)  Update labs including thyroid panel, BMP, CBC, magnesium.  Echocardiogram to rule out structural abnormalities contributing to SVT.   Start Dilit '120mg'$  daily  Refer to electrophysiology.

## 2022-06-08 DIAGNOSIS — I471 Supraventricular tachycardia, unspecified: Secondary | ICD-10-CM | POA: Diagnosis not present

## 2022-06-09 LAB — CBC
Hematocrit: 40.6 % (ref 37.5–51.0)
Hemoglobin: 13.1 g/dL (ref 13.0–17.7)
MCH: 27.2 pg (ref 26.6–33.0)
MCHC: 32.3 g/dL (ref 31.5–35.7)
MCV: 84 fL (ref 79–97)
Platelets: 299 10*3/uL (ref 150–450)
RBC: 4.82 x10E6/uL (ref 4.14–5.80)
RDW: 16.1 % — ABNORMAL HIGH (ref 11.6–15.4)
WBC: 6.4 10*3/uL (ref 3.4–10.8)

## 2022-06-09 LAB — THYROID PANEL WITH TSH
Free Thyroxine Index: 2.7 (ref 1.2–4.9)
T3 Uptake Ratio: 27 % (ref 24–39)
T4, Total: 10.1 ug/dL (ref 4.5–12.0)
TSH: 1.29 u[IU]/mL (ref 0.450–4.500)

## 2022-06-09 LAB — BASIC METABOLIC PANEL
BUN/Creatinine Ratio: 11 (ref 10–24)
BUN: 13 mg/dL (ref 8–27)
CO2: 26 mmol/L (ref 20–29)
Calcium: 9.7 mg/dL (ref 8.6–10.2)
Chloride: 102 mmol/L (ref 96–106)
Creatinine, Ser: 1.15 mg/dL (ref 0.76–1.27)
Glucose: 93 mg/dL (ref 70–99)
Potassium: 3.7 mmol/L (ref 3.5–5.2)
Sodium: 143 mmol/L (ref 134–144)
eGFR: 67 mL/min/{1.73_m2} (ref >59)

## 2022-06-09 LAB — MAGNESIUM: Magnesium: 2.1 mg/dL (ref 1.6–2.3)

## 2022-06-19 ENCOUNTER — Ambulatory Visit (INDEPENDENT_AMBULATORY_CARE_PROVIDER_SITE_OTHER): Payer: Medicare Other

## 2022-06-19 DIAGNOSIS — R0609 Other forms of dyspnea: Secondary | ICD-10-CM

## 2022-06-19 LAB — ECHOCARDIOGRAM COMPLETE
Area-P 1/2: 3.53 cm2
MV M vel: 2.78 m/s
MV Peak grad: 30.9 mmHg
S' Lateral: 2.43 cm

## 2022-06-22 ENCOUNTER — Ambulatory Visit (INDEPENDENT_AMBULATORY_CARE_PROVIDER_SITE_OTHER): Payer: Medicare Other | Admitting: Cardiology

## 2022-06-22 ENCOUNTER — Encounter (HOSPITAL_BASED_OUTPATIENT_CLINIC_OR_DEPARTMENT_OTHER): Payer: Self-pay | Admitting: Cardiology

## 2022-06-22 VITALS — BP 142/88 | HR 73 | Ht 67.0 in | Wt 205.0 lb

## 2022-06-22 DIAGNOSIS — E7800 Pure hypercholesterolemia, unspecified: Secondary | ICD-10-CM

## 2022-06-22 DIAGNOSIS — I10 Essential (primary) hypertension: Secondary | ICD-10-CM

## 2022-06-22 DIAGNOSIS — I471 Supraventricular tachycardia, unspecified: Secondary | ICD-10-CM | POA: Diagnosis not present

## 2022-06-22 DIAGNOSIS — Z9189 Other specified personal risk factors, not elsewhere classified: Secondary | ICD-10-CM | POA: Diagnosis not present

## 2022-06-22 MED ORDER — HYDROCHLOROTHIAZIDE 12.5 MG PO CAPS: 12.5000 mg | ORAL_CAPSULE | Freq: Every day | ORAL | 3 refills | Status: DC

## 2022-06-22 MED ORDER — DILTIAZEM HCL ER COATED BEADS 120 MG PO CP24: 120.0000 mg | ORAL_CAPSULE | Freq: Every day | ORAL | 3 refills | Status: DC

## 2022-06-22 NOTE — Progress Notes (Signed)
Cardiology Office Note:    Date:  06/22/2022   ID:  [name] [name], DOB 1951-05-08, MRN [id number]  PCP:  Biagio Borg, MD  Cardiologist:  Buford Dresser, MD  Referring MD: Biagio Borg, MD   CC: follow up  History of Present Illness:    [name] [name] is a 71 y.o. male with a hx of SVT, hypertension, hyperlipidemia, GERD, BPH, and prostate cancer, who is seen for follow up today. I initially met him 05/17/22 as a new consult at the request of Biagio Borg, MD for the evaluation and management of tachycardia.   FH: brother died of MI in his 19s. Mother had CHF.  On 04/25/22 he complained of intermittent tachycardic episodes associated with dyspnea for a few weeks. He was advised to try taking half tablets of his 50 mg Losartan. However he continued to have sporadic tachycardia so he was referred to cardiology for further evaluation.  On our visit 05/18/2022 His tachycardia seemed to be independent of high or low blood pressures, and was typically associated with dyspnea and a chest tightness/fullness that makes him feel like he is unable to take deep breaths. His tachycardic episodes  occurring 3-4 times a day for up to 10 minutes. With one longer episode of up to a few hours. He doesn't drink any caffeine. He also complained of pre-syncope with low blood pressure but he noted it was fairly uncommon. Previously presented to the hospital with concerns for hematochezia. He had no bleeding issues at the time. He is active and denies feeling limited by his symptoms.   He was seen in the ER 05/31/22 for similar symptoms. He had been recommended diltiazem based on his monitor results, and this was relayed to him at his ER visit.   Today, the patient states that he has been doing well and been compliant with diltiazem. He hasn't had a tachycardic episode longer than one or two beats since. He hasn't had any dizziness, lightheadedness or pre-syncope. He still denies feeling limited by his  symptoms exertionally. His breathing has improved as well and he doesn't notice any edema after staring diltiazem.  His blood pressure has been variable but high to the 150's at home, he hasn't had any episodes of low blood pressure.  He notes that losartan doesn't feel like it changes his symptoms, he has been taking benazepril recently.  HCTZ dropped his blood pressure too much in the past and was discontinued.  He denies any palpitations, chest pain, shortness of breath, or peripheral edema. No lightheadedness, headaches, syncope, orthopnea, or PND.   Past Medical History:  Diagnosis Date   BENIGN PROSTATIC HYPERTROPHY 05/06/2010   COMMON MIGRAINE 07/31/2007   GERD 07/31/2007   HYPERLIPIDEMIA 01/04/2007   HYPERTENSION 01/04/2007   OVERACTIVE BLADDER 03/06/2007   Prostate cancer ([place]) 02/04/2015    Past Surgical History:  Procedure Laterality Date   BIOPSY  12/11/2021   Procedure: BIOPSY;  Surgeon: Jackquline Denmark, MD;  Location: [facility] ENDOSCOPY;  Service: Gastroenterology;;   COLONOSCOPY  2009   ESOPHAGOGASTRODUODENOSCOPY (EGD) WITH PROPOFOL N/A 12/11/2021   Procedure: ESOPHAGOGASTRODUODENOSCOPY (EGD) WITH PROPOFOL;  Surgeon: Jackquline Denmark, MD;  Location: [facility];  Service: Gastroenterology;  Laterality: N/A;    Current Medications: Current Outpatient Medications on File Prior to Visit  Medication Sig   aspirin 81 MG tablet Take 1 tablet (81 mg total) by mouth daily.   atorvastatin (LIPITOR) 40 MG tablet Take 1 tablet (40 mg total) by mouth daily.  benzonatate (TESSALON) 100 MG capsule Take 1 capsule (100 mg total) by mouth every 8 (eight) hours.   Cholecalciferol 50 MCG (2000 UT) TABS 1 tab by mouth once daily (Patient taking differently: Take 2,000 Units by mouth daily.)   diltiazem (CARDIZEM CD) 120 MG 24 hr capsule Take 1 capsule (120 mg total) by mouth daily.   dorzolamide-timolol (COSOPT) 22.3-6.8 MG/ML ophthalmic solution Place 1 drop into both eyes 2 (two) times daily.    doxycycline (VIBRA-TABS) 100 MG tablet Take 1 tablet (100 mg total) by mouth 2 (two) times daily.   GEMTESA 75 MG TABS Take 75 mg by mouth daily. SAMPLES   ketorolac (ACULAR) 0.5 % ophthalmic solution Place 1 drop into the left eye 4 (four) times daily.   latanoprost (XALATAN) 0.005 % ophthalmic solution Place 1 drop into both eyes at bedtime.   losartan (COZAAR) 50 MG tablet Take 1 tablet (50 mg total) by mouth daily.   polyethylene glycol (MIRALAX / GLYCOLAX) packet Take 17 g by mouth as needed for mild constipation.   potassium chloride (KLOR-CON 10) 10 MEQ tablet Take 1 tablet (10 mEq total) by mouth daily.   sildenafil (REVATIO) 20 MG tablet TAKE THREE TABLETS BY MOUTH AS NEEDED (Patient taking differently: Take 60 mg by mouth as needed (erectile dysfunction).)   SYSTANE ULTRA 0.4-0.3 % SOLN Place 1 drop into both eyes daily as needed (dryness).   No current facility-administered medications on file prior to visit.     Allergies:   Beta adrenergic blockers, Celebrex [celecoxib], Crestor [rosuvastatin], Detrol [tolterodine], Ditropan [oxybutynin], Hydralazine, Norvasc [amlodipine besylate], Rapaflo [silodosin], Tadalafil, Tamsulosin, and Toprol xl [metoprolol]   Social History   Tobacco Use   Smoking status: Never   Smokeless tobacco: Never  Vaping Use   Vaping Use: Never used  Substance Use Topics   Alcohol use: No    Alcohol/week: 0.0 standard drinks of alcohol   Drug use: No    Family History: family history includes Heart disease (age of onset: 69) in his brother; Lung cancer (age of onset: 16) in his father. There is no history of Colon cancer, Esophageal cancer, Rectal cancer, or Stomach cancer.  ROS:   Please see the history of present illness.    Additional pertinent ROS otherwise unremarkable    EKGs/Labs/Other Studies Reviewed:    The following studies were reviewed today:  CTA  Abdomen/Pelvis  12/10/2021: IMPRESSION: VASCULAR   No evidence of active GI  bleed. There is enhancing area in the gastric antrum which may reflect a vascular malformation and a potential source of bleeding. Consider correlation with endoscopy.   NON-VASCULAR   Diverticulosis without evidence of acute diverticulitis.  Echo 01/23/19  1. Left ventricular ejection fraction, by visual estimation, is 60 to 65%. The left ventricle has normal function. Left ventricular septal wall thickness was normal. Normal left ventricular posterior wall thickness. There is no left ventricular  hypertrophy.  2. Left ventricular diastolic Doppler parameters are consistent with pseudonormalization pattern of LV diastolic filling.  3. GLS = -19.0%.  4. Global right ventricle has normal systolic function.The right ventricular size is normal. No increase in right ventricular wall thickness.  5. Left atrial size was normal.  6. Right atrial size was normal.  7. The mitral valve is normal in structure. Trace mitral valve regurgitation.  8. The tricuspid valve is normal in structure. Tricuspid valve regurgitation was not visualized by color flow Doppler.  9. The aortic valve The aortic valve is tricuspid Aortic valve regurgitation  was not visualized by color flow Doppler. Mild aortic valve sclerosis without stenosis. 10. The pulmonic valve was normal in structure. Pulmonic valve regurgitation is not visualized by color flow Doppler. 11. The atrial septum is grossly normal.   MPI 12/02/13 Low risk stress nuclear study with a very small reversible defect in the mid and basal inferior wall which is most likely related to variations in diaphragmatic attenunation but cannot rule out a very small area of ischemia..   LV Ejection Fraction: 57%.  LV Wall Motion:  NL LV Function; NL Wall Motion  EKG:  EKG is personally reviewed.   06/22/2022: The EKG was not ordered. 05/17/2022:  SR with 1st degree AV block, nonspecific T pattern, 68 bpm 02/25/2019:  SR with 1st degree AV block, nonspecific TWI lateral  leads, unchanged from prior   Recent Labs: 12/10/2021: ALT 18 06/08/2022: BUN 13; Creatinine, Ser 1.15; Hemoglobin 13.1; Magnesium 2.1; Platelets 299; Potassium 3.7; Sodium 143; TSH 1.290   Recent Lipid Panel    Component Value Date/Time   CHOL 151 10/17/2021 0921   TRIG 66.0 10/17/2021 0921   HDL 57.40 10/17/2021 0921   CHOLHDL 3 10/17/2021 0921   VLDL 13.2 10/17/2021 0921   LDLCALC 80 10/17/2021 0921   LDLDIRECT 139.2 10/29/2012 1421    Physical Exam:    VS:  BP (!) 142/88   Pulse 73   Ht 5\' 7"  (1.702 m)   Wt 205 lb (93 kg)   BMI 32.11 kg/m     Wt Readings from Last 3 Encounters:  07/06/22 204 lb 3.2 oz (92.6 kg)  06/22/22 205 lb (93 kg)  05/31/22 198 lb (89.8 kg)    GEN: Well nourished, well developed in no acute distress HEENT: Normal, moist mucous membranes NECK: No JVD CARDIAC: regular rhythm with occasional audible rapid heart beats, normal S1 and S2, no rubs or gallops. No murmur. VASCULAR: Radial and DP pulses 2+ bilaterally. No carotid bruits RESPIRATORY:  Clear to auscultation without rales, wheezing or rhonchi  ABDOMEN: Soft, non-tender, non-distended MUSCULOSKELETAL:  Ambulates independently SKIN: Warm and dry, no edema NEUROLOGIC:  Alert and oriented x 3. No focal neuro deficits noted. PSYCHIATRIC:  Normal affect    ASSESSMENT:    1. Primary hypertension   2. SVT (supraventricular tachycardia)   3. Pure hypercholesterolemia   4. At increased risk for cardiovascular disease     PLAN:    Intermittent SVT, palpitations:  -improved, reviewed monitor results -continue diltiazem -ECG unremarkable -prior echo without significant abnormalities -prior MPI 2015, but no chest pain/anginal symptoms   Hypertension: above goal today -restart HCTZ, check bmet in 2-3 weeks -was taking benazepril at home, instructed to not take both benazepril and losartan -check home BP   Hypercholesterolemia: elevated ASCVD risk -LDL 80 10/2021, at goal (<100) -continue  atorvastatin 20 mg daily, aspirin 81 mg daily (this is optional, but with hyperglycemia/nearing diabetes, reasonable to continue as he reports no bleeding issues)  Cardiac risk counseling and prevention recommendations: -recommend heart healthy/Mediterranean diet, with whole grains, fruits, vegetable, fish, lean meats, nuts, and olive oil. Limit salt. -recommend moderate walking, 3-5 times/week for 30-50 minutes each session. Aim for at least 150 minutes.week. Goal should be pace of 3 miles/hours, or walking 1.5 miles in 30 minutes -recommend avoidance of tobacco products. Avoid excess alcohol. -ASCVD risk score: The 10-year ASCVD risk score (Arnett DK, et al., 2019) is: 27.8%   Values used to calculate the score:     Age: 44 years  Sex: Male     Is Non-Hispanic African American: Yes     Diabetic: No     Tobacco smoker: No     Systolic Blood Pressure: 275 mmHg     Is BP treated: Yes     HDL Cholesterol: 57.4 mg/dL     Total Cholesterol: 151 mg/dL    Plan for follow up: 3 months or sooner as needed.  Buford Dresser, MD, PhD, [facility]    Medication Adjustments/Labs and Tests Ordered: Current medicines are reviewed at length with the patient today.  Concerns regarding medicines are outlined above.   Orders Placed This Encounter  Procedures   Basic metabolic panel   Meds ordered this encounter  Medications   diltiazem (CARDIZEM CD) 120 MG 24 hr capsule    Sig: Take 1 capsule (120 mg total) by mouth daily.    Dispense:  90 capsule    Refill:  3   hydrochlorothiazide (MICROZIDE) 12.5 MG capsule    Sig: Take 1 capsule (12.5 mg total) by mouth daily.    Dispense:  90 capsule    Refill:  3   Patient Instructions  Medication Instructions:  Restart the HCTZ 12.5 mg once a day, continue the benazepril  *If you need a refill on your cardiac medications before your next appointment, please call your pharmacy*   Lab Work: BMET in 2 to 3  weeks  If you have labs (blood work) drawn today and your tests are completely normal, you will receive your results only by: [place] (if you have MyChart) OR A paper copy in the mail If you have any lab test that is abnormal or we need to change your treatment, we will call you to review the results.   Testing/Procedures: None   Follow-Up: At [facility], you and your health needs are our priority.  As part of our continuing mission to provide you with exceptional heart care, we have created designated Provider Care Teams.  These Care Teams include your primary Cardiologist (physician) and Advanced Practice Providers (APPs -  Physician Assistants and Nurse Practitioners) who all work together to provide you with the care you need, when you need it.  We recommend signing up for the patient portal called "MyChart".  Sign up information is provided on this After Visit Summary.  MyChart is used to connect with patients for Virtual Visits (Telemedicine).  Patients are able to view lab/test results, encounter notes, upcoming appointments, etc.  Non-urgent messages can be sent to your provider as well.   To learn more about what you can do with MyChart, go to [email or web address].    Your next appointment:   3 month(s)  Provider:   Buford Dresser, MD    Other Instructions  Check blood pressures at home, aiming for less than 130/80. If you have low blood pressures, dizziness/lightheadedness, call.      I,Coren O'Brien,acting as a Education administrator for [employer], MD.,have documented all relevant documentation on the behalf of Buford Dresser, MD,as directed by  Buford Dresser, MD while in the presence of Buford Dresser, MD.  I, Buford Dresser, MD, have reviewed all documentation for this visit. The documentation on 07/23/22 for the exam, diagnosis, procedures, and orders are all accurate and complete.

## 2022-06-22 NOTE — Patient Instructions (Addendum)
Medication Instructions:  Restart the HCTZ 12.5 mg once a day, continue the benazepril  *If you need a refill on your cardiac medications before your next appointment, please call your pharmacy*   Lab Work: BMET in 2 to 3 weeks  If you have labs (blood work) drawn today and your tests are completely normal, you will receive your results only by: [place] (if you have MyChart) OR A paper copy in the mail If you have any lab test that is abnormal or we need to change your treatment, we will call you to review the results.   Testing/Procedures: None   Follow-Up: At [facility], you and your health needs are our priority.  As part of our continuing mission to provide you with exceptional heart care, we have created designated Provider Care Teams.  These Care Teams include your primary Cardiologist (physician) and Advanced Practice Providers (APPs -  Physician Assistants and Nurse Practitioners) who all work together to provide you with the care you need, when you need it.  We recommend signing up for the patient portal called "MyChart".  Sign up information is provided on this After Visit Summary.  MyChart is used to connect with patients for Virtual Visits (Telemedicine).  Patients are able to view lab/test results, encounter notes, upcoming appointments, etc.  Non-urgent messages can be sent to your provider as well.   To learn more about what you can do with MyChart, go to [email or web address].    Your next appointment:   3 month(s)  Provider:   Buford Dresser, MD    Other Instructions  Check blood pressures at home, aiming for less than 130/80. If you have low blood pressures, dizziness/lightheadedness, call.

## 2022-06-23 DIAGNOSIS — H35352 Cystoid macular degeneration, left eye: Secondary | ICD-10-CM | POA: Diagnosis not present

## 2022-06-23 DIAGNOSIS — H02834 Dermatochalasis of left upper eyelid: Secondary | ICD-10-CM | POA: Diagnosis not present

## 2022-06-23 DIAGNOSIS — H0102B Squamous blepharitis left eye, upper and lower eyelids: Secondary | ICD-10-CM | POA: Diagnosis not present

## 2022-06-23 DIAGNOSIS — Z961 Presence of intraocular lens: Secondary | ICD-10-CM | POA: Diagnosis not present

## 2022-06-23 DIAGNOSIS — H0102A Squamous blepharitis right eye, upper and lower eyelids: Secondary | ICD-10-CM | POA: Diagnosis not present

## 2022-06-23 DIAGNOSIS — H401131 Primary open-angle glaucoma, bilateral, mild stage: Secondary | ICD-10-CM | POA: Diagnosis not present

## 2022-06-23 DIAGNOSIS — H02831 Dermatochalasis of right upper eyelid: Secondary | ICD-10-CM | POA: Diagnosis not present

## 2022-06-23 DIAGNOSIS — H26491 Other secondary cataract, right eye: Secondary | ICD-10-CM | POA: Diagnosis not present

## 2022-06-26 ENCOUNTER — Ambulatory Visit: Payer: Medicare Other | Admitting: Internal Medicine

## 2022-07-06 ENCOUNTER — Encounter: Payer: Self-pay | Admitting: Internal Medicine

## 2022-07-06 ENCOUNTER — Ambulatory Visit: Payer: Medicare Other | Attending: Internal Medicine | Admitting: Internal Medicine

## 2022-07-06 VITALS — BP 150/80 | HR 66 | Ht 67.0 in | Wt 204.2 lb

## 2022-07-06 DIAGNOSIS — I10 Essential (primary) hypertension: Secondary | ICD-10-CM | POA: Diagnosis not present

## 2022-07-06 DIAGNOSIS — I479 Paroxysmal tachycardia, unspecified: Secondary | ICD-10-CM | POA: Insufficient documentation

## 2022-07-06 DIAGNOSIS — I471 Supraventricular tachycardia, unspecified: Secondary | ICD-10-CM | POA: Diagnosis not present

## 2022-07-06 NOTE — Progress Notes (Signed)
HPI [name] is referred for evaluation of SVT. He is a pleasant 72 yo man with palpitations and HTN who underwent monitoring and was found to have repetitive episodes of SVT which appears to be a narrow short RP tachy at 140/min. The patient feels sob and fatigue. He has not had syncope. He has been placed on a  beta blocker and he notes that his symptoms are much improved.  Allergies  Allergen Reactions   Beta Adrenergic Blockers Other (See Comments)    bradycardia   Celebrex [Celecoxib] Other (See Comments)    Stroke like symptoms   Crestor [Rosuvastatin] Other (See Comments)    weakness   Detrol [Tolterodine] Other (See Comments)    headache   Ditropan [Oxybutynin] Other (See Comments)    Dry mouth   Hydralazine Other (See Comments)    Pt seems abnormally sensitive to med.  Got just '50mg'$  PO, dropped SBP from 180 to 73 with paradoxical sinus bradycardia in low 40s. If going to try to use in future, consider dramatically reduced dose.   Norvasc [Amlodipine Besylate] Swelling   Rapaflo [Silodosin] Other (See Comments)    Lwo blood pressures   Tadalafil Other (See Comments)    headache   Tamsulosin Other (See Comments)    Low blood pressure drops with taking   Toprol Xl [Metoprolol] Other (See Comments)    dizzy     Current Outpatient Medications  Medication Sig Dispense Refill   aspirin 81 MG tablet Take 1 tablet (81 mg total) by mouth daily.     atorvastatin (LIPITOR) 40 MG tablet Take 1 tablet (40 mg total) by mouth daily. 90 tablet 3   benazepril (LOTENSIN) 40 MG tablet Take 40 mg by mouth daily.     benzonatate (TESSALON) 100 MG capsule Take 1 capsule (100 mg total) by mouth every 8 (eight) hours. 21 capsule 0   Cholecalciferol 50 MCG (2000 UT) TABS 1 tab by mouth once daily (Patient taking differently: Take 2,000 Units by mouth daily.) 30 tablet 99   diltiazem (CARDIZEM CD) 120 MG 24 hr capsule Take 1 capsule (120 mg total) by mouth daily. 90 capsule 3    dorzolamide-timolol (COSOPT) 22.3-6.8 MG/ML ophthalmic solution Place 1 drop into both eyes 2 (two) times daily.     GEMTESA 75 MG TABS Take 75 mg by mouth daily. SAMPLES     hydrochlorothiazide (MICROZIDE) 12.5 MG capsule Take 1 capsule (12.5 mg total) by mouth daily. 90 capsule 3   ketorolac (ACULAR) 0.5 % ophthalmic solution Place 1 drop into the left eye 4 (four) times daily.     latanoprost (XALATAN) 0.005 % ophthalmic solution Place 1 drop into both eyes at bedtime.  1   polyethylene glycol (MIRALAX / GLYCOLAX) packet Take 17 g by mouth as needed for mild constipation.     potassium chloride (KLOR-CON 10) 10 MEQ tablet Take 1 tablet (10 mEq total) by mouth daily. 90 tablet 3   sildenafil (REVATIO) 20 MG tablet TAKE THREE TABLETS BY MOUTH AS NEEDED (Patient taking differently: Take 60 mg by mouth as needed (erectile dysfunction).) 90 tablet 11   SYSTANE ULTRA 0.4-0.3 % SOLN Place 1 drop into both eyes daily as needed (dryness).     No current facility-administered medications for this visit.     Past Medical History:  Diagnosis Date   BENIGN PROSTATIC HYPERTROPHY 05/06/2010   COMMON MIGRAINE 07/31/2007   GERD 07/31/2007   HYPERLIPIDEMIA 01/04/2007   HYPERTENSION 01/04/2007  OVERACTIVE BLADDER 03/06/2007   Prostate cancer ([place]) 02/04/2015    ROS:   All systems reviewed and negative except as noted in the HPI.   Past Surgical History:  Procedure Laterality Date   BIOPSY  12/11/2021   Procedure: BIOPSY;  Surgeon: Jackquline Denmark, MD;  Location[facility] ENDOSCOPY;  Service: Gastroenterology;;   COLONOSCOPY  2009   ESOPHAGOGASTRODUODENOSCOPY (EGD) WITH PROPOFOL N/A 12/11/2021   Procedure: ESOPHAGOGASTRODUODENOSCOPY (EGD) WITH PROPOFOL;  Surgeon: Jackquline Denmark, MD;  Location: [facility];  Service: Gastroenterology;  Laterality: N/A;     Family History  Problem Relation Age of Onset   Lung cancer Father 23   Heart disease Brother 62       died from MI    Colon cancer Neg Hx     Esophageal cancer Neg Hx    Rectal cancer Neg Hx    Stomach cancer Neg Hx      Social History   Socioeconomic History   Marital status: Married    Spouse name: Not on file   Number of children: 3   Years of education: Not on file   Highest education level: Not on file  Occupational History   Occupation: pastor  Tobacco Use   Smoking status: Never   Smokeless tobacco: Never  Vaping Use   Vaping Use: Never used  Substance and Sexual Activity   Alcohol use: No    Alcohol/week: 0.0 standard drinks of alcohol   Drug use: No   Sexual activity: Not on file  Other Topics Concern   Not on file  Social History Narrative   Not on file   Social Determinants of Health   Financial Resource Strain: Low Risk  (09/30/2021)   Overall Financial Resource Strain (CARDIA)    Difficulty of Paying Living Expenses: Not hard at all  Food Insecurity: No Food Insecurity (09/30/2021)   Hunger Vital Sign    Worried About Running Out of Food in the Last Year: Never true    [place] in the Last Year: Never true  Transportation Needs: No Transportation Needs (09/30/2021)   PRAPARE - Hydrologist (Medical): No    Lack of Transportation (Non-Medical): No  Physical Activity: Insufficiently Active (09/30/2021)   Exercise Vital Sign    Days of Exercise per Week: 2 days    Minutes of Exercise per Session: 30 min  Stress: No Stress Concern Present (09/30/2021)   [place]    Feeling of Stress : Not at all  Social Connections: [place] (09/30/2021)   Social Connection and Isolation Panel [NHANES]    Frequency of Communication with Friends and Family: More than three times a week    Frequency of Social Gatherings with Friends and Family: Three times a week    Attends Religious Services: More than 4 times per year    Active Member of Clubs or Organizations: No    Attends Archivist  Meetings: 1 to 4 times per year    Marital Status: Married  Human resources officer Violence: Not At Risk (09/30/2021)   Humiliation, Afraid, Rape, and Kick questionnaire    Fear of Current or Ex-Partner: No    Emotionally Abused: No    Physically Abused: No    Sexually Abused: No     BP (!) 150/80   Pulse 66   Ht '5\' 7"'$  (1.702 m)   Wt 204 lb 3.2 oz (92.6 kg)   SpO2 98%  BMI 31.98 kg/m   Physical Exam:  Well appearing NAD HEENT: Unremarkable Neck:  No JVD, no thyromegally Lymphatics:  No adenopathy Back:  No CVA tenderness Lungs:  Clear with no wheezes HEART:  Regular rate rhythm, no murmurs, no rubs, no clicks Abd:  soft, positive bowel sounds, no organomegally, no rebound, no guarding Ext:  2 plus pulses, no edema, no cyanosis, no clubbing Skin:  No rashes no nodules Neuro:  CN II through XII intact, motor grossly intact  EKG - nsr with first degree AV block   Assess/Plan: SVT - I have discussed the treatment options with the patient and recommended watchful waiting. If his symptoms of SVT worsen, then catheter ablation would be recommended. I have discussed the procedure with the patient. HTN -his bp is elevated but usually better at home.  Carleene Overlie Odaliz Mcqueary,MD

## 2022-07-06 NOTE — Patient Instructions (Addendum)
Medication Instructions:  Your physician recommends that you continue on your current medications as directed. Please refer to the Current Medication list given to you today.  *If you need a refill on your cardiac medications before your next appointment, please call your pharmacy*  Lab Work: None ordered.  If you have labs (blood work) drawn today and your tests are completely normal, you will receive your results only by: [place] (if you have MyChart) OR A paper copy in the mail If you have any lab test that is abnormal or we need to change your treatment, we will call you to review the results.  Testing/Procedures: None ordered.  Follow-Up: At [facility], you and your health needs are our priority.  As part of our continuing mission to provide you with exceptional heart care, we have created designated Provider Care Teams.  These Care Teams include your primary Cardiologist (physician) and Advanced Practice Providers (APPs -  Physician Assistants and Nurse Practitioners) who all work together to provide you with the care you need, when you need it.  We recommend signing up for the patient portal called "MyChart".  Sign up information is provided on this After Visit Summary.  MyChart is used to connect with patients for Virtual Visits (Telemedicine).  Patients are able to view lab/test results, encounter notes, upcoming appointments, etc.  Non-urgent messages can be sent to your provider as well.   To learn more about what you can do with MyChart, go to [email or web address].    Your next appointment:   AS NEEDED   The format for your next appointment:   In Person  Provider:   Cristopher Peru, MD{or one of the following Advanced Practice Providers on your designated Care Team:   Tommye Standard, [place] Legrand Como "[facility]" [place], [place]

## 2022-07-10 ENCOUNTER — Encounter (HOSPITAL_BASED_OUTPATIENT_CLINIC_OR_DEPARTMENT_OTHER): Payer: Self-pay

## 2022-07-11 NOTE — Telephone Encounter (Signed)
Pt. Following up with you...please advise

## 2022-07-11 NOTE — Telephone Encounter (Signed)
Amlodipine on allergy list with reports of swelling. This is also in the same group as Diltiazem so would not recommend taking them together. Will route to Dr. Harrell Gave for her input.  Thoughts on discontinuing HCTZ and monitoring for one week at home? Or trial half tablet of Benazepril with the HCTZ 12.'5mg'$ ?

## 2022-07-12 ENCOUNTER — Encounter: Payer: Self-pay | Admitting: Internal Medicine

## 2022-07-12 ENCOUNTER — Encounter (HOSPITAL_BASED_OUTPATIENT_CLINIC_OR_DEPARTMENT_OTHER): Payer: Self-pay

## 2022-07-12 MED ORDER — AMLODIPINE BESYLATE 2.5 MG PO TABS: 2.5000 mg | ORAL_TABLET | Freq: Every day | ORAL | 3 refills | Status: DC

## 2022-07-13 DIAGNOSIS — R3915 Urgency of urination: Secondary | ICD-10-CM | POA: Diagnosis not present

## 2022-07-13 DIAGNOSIS — N401 Enlarged prostate with lower urinary tract symptoms: Secondary | ICD-10-CM | POA: Diagnosis not present

## 2022-07-15 NOTE — Telephone Encounter (Signed)
If he wishes, can trial increased dose Diltiazem '180mg'$  QD for BP control.   [facility] PCP as Juluis Rainier as he has also messaged their office for recommendations.   Loel Dubonnet, NP

## 2022-07-24 ENCOUNTER — Encounter (HOSPITAL_COMMUNITY): Payer: Self-pay | Admitting: *Deleted

## 2022-07-24 ENCOUNTER — Ambulatory Visit (HOSPITAL_COMMUNITY)
Admission: EM | Admit: 2022-07-24 | Discharge: 2022-07-24 | Disposition: A | Discharge status: Home / Self Care | Payer: Medicare Other | Attending: Emergency Medicine | Admitting: Emergency Medicine

## 2022-07-24 ENCOUNTER — Other Ambulatory Visit: Payer: Self-pay

## 2022-07-24 DIAGNOSIS — K2900 Acute gastritis without bleeding: Secondary | ICD-10-CM | POA: Diagnosis not present

## 2022-07-24 MED ORDER — LIDOCAINE VISCOUS HCL 2 % MT SOLN
OROMUCOSAL | Status: AC
  Filled 2022-07-24: qty 15

## 2022-07-24 MED ORDER — ALUM & MAG HYDROXIDE-SIMETH 200-200-20 MG/5ML PO SUSP
30.0000 mL | Freq: Once | ORAL | Status: AC
  Administered 2022-07-24: 30 mL via ORAL

## 2022-07-24 MED ORDER — ALUM & MAG HYDROXIDE-SIMETH 200-200-20 MG/5ML PO SUSP
ORAL | Status: AC
  Filled 2022-07-24: qty 30

## 2022-07-24 MED ORDER — PANTOPRAZOLE SODIUM 20 MG PO TBEC: 20.0000 mg | DELAYED_RELEASE_TABLET | Freq: Every day | ORAL | 0 refills | Status: DC

## 2022-07-24 MED ORDER — LIDOCAINE VISCOUS HCL 2 % MT SOLN
15.0000 mL | Freq: Once | OROMUCOSAL | Status: AC
  Administered 2022-07-24: 15 mL via OROMUCOSAL

## 2022-07-24 NOTE — Discharge Instructions (Addendum)
Trial of Protonix once daily Please take first thing in the morning on empty stomach for the next 2 weeks Call the stomach specialist to schedule a follow up. You may need to have another endoscopy to evaluate your symptoms  Please go to the emergency department if symptoms worsen.

## 2022-07-24 NOTE — ED Triage Notes (Signed)
PT reports he has GERD and has had some heart burn for the past few days. Pt reports he eats spicy foods.

## 2022-07-24 NOTE — ED Provider Notes (Signed)
[facility]    CSN: [id number] Arrival date & time: 07/24/22  1023     History   Chief Complaint Chief Complaint  Patient presents with   Heartburn    HPI [name] [name] is a 72 y.o. male.  Here with 4 day history of epigastric discomfort A burning sensation in the belly up to sternum, only 4/10 discomfort No nausea, vomiting, diarrhea, constipation, fever.  No urinary symptoms.  Denies chest pain or shortness of breath No hematuria, hematemesis, hematochezia He does have documented hx of lower GI bleed, diverticulitis, h pylori. Has seen GI in the past, most recent this summer  Past Medical History:  Diagnosis Date   BENIGN PROSTATIC HYPERTROPHY 05/06/2010   COMMON MIGRAINE 07/31/2007   GERD 07/31/2007   HYPERLIPIDEMIA 01/04/2007   HYPERTENSION 01/04/2007   OVERACTIVE BLADDER 03/06/2007   Prostate cancer ([place]) 02/04/2015    Patient Active Problem List   Diagnosis Date Noted   SVT (supraventricular tachycardia) 07/06/2022   Dyspnea 04/23/2022   Anxiety 04/23/2022   Asthma 03/28/2022   Hypotension 12/11/2021   Diverticulosis of intestine with bleeding 12/11/2021   GI bleed 12/10/2021   Primary hypertension 12/10/2021   Acute blood loss anemia 12/10/2021   Lower GI bleeding 12/08/2021   Bradycardia 07/20/2021   Myofascial pain syndrome, cervical 04/19/2021   Vitamin D deficiency 09/25/2019   Dysuria 05/15/2019   Palpitation 02/25/2019   Paroxysmal tachycardia ([place]) 02/25/2019   Tachycardia 01/14/2019   Generalized anxiety disorder with panic attacks 09/25/2018   Mid back pain on right side 05/14/2018   Hyperglycemia 03/26/2018   Low back pain 04/19/2017   Lightheadedness 10/26/2016   Eustachian tube dysfunction, right 08/17/2016   Allergic rhinitis 08/17/2016   Erectile dysfunction [id number]   Diastolic dysfunction 99991111   Syncope 04/04/2016   Weakness 03/26/2015   Malignant neoplasm of prostate ([place]) 02/23/2015   Chest pain 10/01/2013    Cough 08/04/2013   Asthmatic bronchitis 07/15/2013   Microhematuria 05/09/2012   Constipation 08/13/2011   Ventral hernia [id number]   Umbilical hernia [id number]   Preventative health care 04/25/2011   Nocturia 06/29/2010   BPH (benign prostatic hyperplasia) 05/06/2010   BACK PAIN 05/06/2010   SKIN LESION 11/30/2008   FREQUENCY, URINARY 11/30/2008   COMMON MIGRAINE 07/31/2007   GERD 07/31/2007   OVERACTIVE BLADDER 03/06/2007   Hypercholesterolemia 01/04/2007   Labile hypertension 01/04/2007    Past Surgical History:  Procedure Laterality Date   BIOPSY  12/11/2021   Procedure: BIOPSY;  Surgeon: Jackquline Denmark, MD;  Location: [facility] ENDOSCOPY;  Service: Gastroenterology;;   COLONOSCOPY  2009   ESOPHAGOGASTRODUODENOSCOPY (EGD) WITH PROPOFOL N/A 12/11/2021   Procedure: ESOPHAGOGASTRODUODENOSCOPY (EGD) WITH PROPOFOL;  Surgeon: Jackquline Denmark, MD;  Location: [facility] ENDOSCOPY;  Service: Gastroenterology;  Laterality: N/A;    Home Medications    Prior to Admission medications   Medication Sig Start Date End Date Taking? Authorizing Provider  pantoprazole (PROTONIX) 20 MG tablet Take 1 tablet (20 mg total) by mouth daily for 14 days. 07/24/22 08/07/22 Yes Noeh Sparacino, Wells Guiles, PA-C  amLODipine (NORVASC) 2.5 MG tablet Take 1 tablet (2.5 mg total) by mouth daily. 07/12/22 07/12/23  Biagio Borg, MD  aspirin 81 MG tablet Take 1 tablet (81 mg total) by mouth daily. 12/18/21   Mariel Aloe, MD  atorvastatin (LIPITOR) 40 MG tablet Take 1 tablet (40 mg total) by mouth daily. 07/20/21   Biagio Borg, MD  benazepril (LOTENSIN) 40 MG tablet Take 40 mg by mouth daily.  [provider]  benzonatate (TESSALON) 100 MG capsule Take 1 capsule (100 mg total) by mouth every 8 (eight) hours. 03/25/22   Raspet, Derry Skill, PA-C  Cholecalciferol 50 MCG (2000 UT) TABS 1 tab by mouth once daily Patient taking differently: Take 2,000 Units by mouth daily. 07/20/21   Biagio Borg, MD  diltiazem (CARDIZEM CD) 120 MG 24 hr  capsule Take 1 capsule (120 mg total) by mouth daily. 06/22/22   Buford Dresser, MD  dorzolamide-timolol (COSOPT) 22.3-6.8 MG/ML ophthalmic solution Place 1 drop into both eyes 2 (two) times daily. 09/24/20   [provider]  GEMTESA 75 MG TABS Take 75 mg by mouth daily. SAMPLES 11/29/21   [provider]  hydrochlorothiazide (MICROZIDE) 12.5 MG capsule Take 1 capsule (12.5 mg total) by mouth daily. 06/22/22 06/17/23  Buford Dresser, MD  ketorolac (ACULAR) 0.5 % ophthalmic solution Place 1 drop into the left eye 4 (four) times daily. 12/16/21   [provider]  latanoprost (XALATAN) 0.005 % ophthalmic solution Place 1 drop into both eyes at bedtime. 11/09/17   [provider]  polyethylene glycol (MIRALAX / GLYCOLAX) packet Take 17 g by mouth as needed for mild constipation.    [provider]  potassium chloride (KLOR-CON 10) 10 MEQ tablet Take 1 tablet (10 mEq total) by mouth daily. 07/20/21   Biagio Borg, MD  sildenafil (REVATIO) 20 MG tablet TAKE THREE TABLETS BY MOUTH AS NEEDED Patient taking differently: Take 60 mg by mouth as needed (erectile dysfunction). 04/26/21   Biagio Borg, MD  SYSTANE ULTRA 0.4-0.3 % SOLN Place 1 drop into both eyes daily as needed (dryness). 08/14/19   [provider]    Family History Family History  Problem Relation Age of Onset   Lung cancer Father 60   Heart disease Brother 64       died from MI    Colon cancer Neg Hx    Esophageal cancer Neg Hx    Rectal cancer Neg Hx    Stomach cancer Neg Hx     Social History Social History   Tobacco Use   Smoking status: Never   Smokeless tobacco: Never  Vaping Use   Vaping Use: Never used  Substance Use Topics   Alcohol use: No    Alcohol/week: 0.0 standard drinks of alcohol   Drug use: No     Allergies   Beta adrenergic blockers, Celebrex [celecoxib], Crestor [rosuvastatin], Detrol [tolterodine], Ditropan [oxybutynin], Hydralazine, Norvasc  [amlodipine besylate], Rapaflo [silodosin], Tadalafil, Tamsulosin, and Toprol xl [metoprolol]   Review of Systems Review of Systems As per HPI  Physical Exam Triage Vital Signs ED Triage Vitals  Enc Vitals Group     BP 07/24/22 1124 (!) 160/89     Pulse Rate 07/24/22 1124 62     Resp 07/24/22 1124 18     Temp 07/24/22 1124 97.8 F (36.6 C)     Temp src --      SpO2 07/24/22 1124 96 %     Weight --      Height --      Head Circumference --      Peak Flow --      Pain Score 07/24/22 1122 4     Pain Loc --      Pain Edu? --      Excl. in [place]? --    No data found.  Updated Vital Signs BP (!) 160/89   Pulse 62   Temp 97.8 F (  36.6 C)   Resp 18   SpO2 96%    Physical Exam Vitals and nursing note reviewed.  Constitutional:      Appearance: Normal appearance.  HENT:     Mouth/Throat:     Mouth: Mucous membranes are moist.     Pharynx: Oropharynx is clear. No posterior oropharyngeal erythema.  Eyes:     Conjunctiva/sclera: Conjunctivae normal.  Cardiovascular:     Rate and Rhythm: Normal rate and regular rhythm.     Heart sounds: Normal heart sounds.     Comments: Occasional early beats palpated right radial Pulmonary:     Effort: Pulmonary effort is normal. No respiratory distress.     Breath sounds: Normal breath sounds.  Abdominal:     General: Bowel sounds are normal.     Palpations: Abdomen is soft.     Tenderness: There is no abdominal tenderness. There is no guarding or rebound.     Comments: Mildly tender epigastric  Musculoskeletal:        General: Normal range of motion.  Skin:    General: Skin is warm and dry.  Neurological:     Mental Status: He is alert and oriented to person, place, and time.     UC Treatments / Results  Labs (all labs ordered are listed, but only abnormal results are displayed) Labs Reviewed - No data to display  EKG   Radiology No results found.  Procedures Procedures (including critical care time)  Medications  Ordered in UC Medications  alum & mag hydroxide-simeth (MAALOX/MYLANTA) 200-200-20 MG/5ML suspension 30 mL (30 mLs Oral Given 07/24/22 1205)  lidocaine (XYLOCAINE) 2 % viscous mouth solution 15 mL (15 mLs Mouth/Throat Given 07/24/22 1205)    Initial Impression / Assessment and Plan / UC Course  I have reviewed the triage vital signs and the nursing notes.  Pertinent labs & imaging results that were available during my care of the patient were reviewed by me and considered in my medical decision making (see chart for details).  EKG sinus bradycardia at 59 BPM. Several PACs. First degree heart block. No ST or T wave abnormality. No changes compared with prior reading 2/29. Low concern for cardiac etiology of symptoms.   Epigastric discomfort improved with GI cocktail.  Suspect gastritis.  Trial of Protonix once daily for 2 weeks.  Advised to call GI and schedule follow-up.  No red flags at this time. Strict ED precautions in the meantime  Final Clinical Impressions(s) / UC Diagnoses   Final diagnoses:  Acute gastritis without hemorrhage, unspecified gastritis type     Discharge Instructions      Trial of Protonix once daily Please take first thing in the morning on empty stomach for the next 2 weeks Call the stomach specialist to schedule a follow up. You may need to have another endoscopy to evaluate your symptoms  Please go to the emergency department if symptoms worsen.     ED Prescriptions     Medication Sig Dispense Auth. Provider   pantoprazole (PROTONIX) 20 MG tablet Take 1 tablet (20 mg total) by mouth daily for 14 days. 14 tablet Arraya Buck, Wells Guiles, PA-C      PDMP not reviewed this encounter.   [place], [place] 07/24/22 1337

## 2022-08-08 ENCOUNTER — Ambulatory Visit (INDEPENDENT_AMBULATORY_CARE_PROVIDER_SITE_OTHER): Payer: Medicare Other | Admitting: Internal Medicine

## 2022-08-08 ENCOUNTER — Encounter: Payer: Self-pay | Admitting: Internal Medicine

## 2022-08-08 VITALS — BP 138/78 | HR 68 | Ht 67.0 in | Wt 203.0 lb

## 2022-08-08 DIAGNOSIS — K219 Gastro-esophageal reflux disease without esophagitis: Secondary | ICD-10-CM | POA: Diagnosis not present

## 2022-08-08 DIAGNOSIS — K2970 Gastritis, unspecified, without bleeding: Secondary | ICD-10-CM

## 2022-08-08 MED ORDER — PANTOPRAZOLE SODIUM 40 MG PO TBEC: 40.0000 mg | DELAYED_RELEASE_TABLET | Freq: Every day | ORAL | 3 refills | Status: DC

## 2022-08-08 NOTE — Patient Instructions (Signed)
_______________________________________________________  If your blood pressure at your visit was 140/90 or greater, please contact your primary care physician to follow up on this.  _______________________________________________________  If you are age 74 or older, your body mass index should be between 23-30. Your Body mass index is 31.79 kg/m. If this is out of the aforementioned range listed, please consider follow up with your Primary Care Provider.  If you are age 70 or younger, your body mass index should be between 19-25. Your Body mass index is 31.79 kg/m. If this is out of the aformentioned range listed, please consider follow up with your Primary Care Provider.   ________________________________________________________  The [facility] providers would like to encourage you to use [facility] to communicate with providers for non-urgent requests or questions.  Due to long hold times on the telephone, sending your provider a message by [facility] may be a faster and more efficient way to get a response.  Please allow 48 business hours for a response.  Please remember that this is for non-urgent requests.  _______________________________________________________  We have sent the following medications to your pharmacy for you to pick up at your convenience:  Pantoprazole  Please follow up as needed

## 2022-08-08 NOTE — Progress Notes (Signed)
HISTORY OF PRESENT ILLNESS:  [name] [name] is a 72 y.o. male with past medical history is listed below.  He is sent today by urgent care regarding recent problems with epigastric pain.  I have reviewed that encounter, laboratories, and x-rays.  I saw the patient in August 2019 for routine screening colonoscopy.  He was found to have pandiverticulosis.  Otherwise normal.  Follow-up in 10 years recommended.  Patient was in his usual state of health until August 2023 when he presented to the hospital with painless hematochezia.  He was felt to have an acute diverticular bleed which resolved.  He did have an elevated BUN and thus underwent upper endoscopy with Dr. Lyndel Safe December 11, 2021.  He was found to have mild gastritis incidental diminutive submucosal duodenal nodule.  Biopsies for Helicobacter pylori returned positive.  He was treated with quadruple therapy (amoxicillin, clarithromycin, metronidazole, pantoprazole) for 14 days.  Patient started to develop gastric discomfort without other associated symptoms.  This lasted about 2 weeks.  Urgent care.  Was evaluated July 24, 2022.  He described it as a burning sensation.  He does have GERD.  He was not on PPI at that time.  His evaluation was unremarkable.  He was prescribed pantoprazole and told to follow-up at this time.  Patient tells me that his discomfort resolved.  No concerns.  No further problems with reflux symptoms.  No dysphagia.  He is pleased.  He is it is just run out of his medication, which he took for 2 weeks  REVIEW OF SYSTEMS:  All non-GI ROS negative unless otherwise stated in the HPI except for fatigue  Past Medical History:  Diagnosis Date   BENIGN PROSTATIC HYPERTROPHY 05/06/2010   COMMON MIGRAINE 07/31/2007   GERD 07/31/2007   HYPERLIPIDEMIA 01/04/2007   HYPERTENSION 01/04/2007   OVERACTIVE BLADDER 03/06/2007   Prostate cancer 02/04/2015    Past Surgical History:  Procedure Laterality Date   BIOPSY  12/11/2021    Procedure: BIOPSY;  Surgeon: Jackquline Denmark, MD;  Location: [facility] ENDOSCOPY;  Service: Gastroenterology;;   COLONOSCOPY  2009   ESOPHAGOGASTRODUODENOSCOPY (EGD) WITH PROPOFOL N/A 12/11/2021   Procedure: ESOPHAGOGASTRODUODENOSCOPY (EGD) WITH PROPOFOL;  Surgeon: Jackquline Denmark, MD;  Location: [facility];  Service: Gastroenterology;  Laterality: N/A;    Social History AMES STANFIELD  reports that he has never smoked. He has never used smokeless tobacco. He reports that he does not drink alcohol and does not use drugs.  family history includes Heart disease (age of onset: 61) in his brother; Lung cancer (age of onset: 11) in his father.  Allergies  Allergen Reactions   Beta Adrenergic Blockers Other (See Comments)    bradycardia   Celebrex [Celecoxib] Other (See Comments)    Stroke like symptoms   Crestor [Rosuvastatin] Other (See Comments)    weakness   Detrol [Tolterodine] Other (See Comments)    headache   Ditropan [Oxybutynin] Other (See Comments)    Dry mouth   Hydralazine Other (See Comments)    Pt seems abnormally sensitive to med.  Got just 50mg  PO, dropped SBP from 180 to 73 with paradoxical sinus bradycardia in low 40s. If going to try to use in future, consider dramatically reduced dose.   Norvasc [Amlodipine Besylate] Swelling   Rapaflo [Silodosin] Other (See Comments)    Lwo blood pressures   Tadalafil Other (See Comments)    headache   Tamsulosin Other (See Comments)    Low blood pressure drops with taking   Toprol  Xl [Metoprolol] Other (See Comments)    dizzy       PHYSICAL EXAMINATION: Vital signs: BP 138/78   Pulse 68   Ht 5\' 7"  (1.702 m)   Wt 203 lb (92.1 kg)   BMI 31.79 kg/m   Constitutional: generally well-appearing, no acute distress Psychiatric: alert and oriented x3, cooperative Eyes: extraocular movements intact, anicteric, conjunctiva pink Mouth: oral pharynx moist, no lesions Neck: supple no lymphadenopathy Cardiovascular: heart regular rate and  rhythm, no murmur Lungs: clear to auscultation bilaterally Abdomen: soft, nontender, nondistended, no obvious ascites, no peritoneal signs, normal bowel sounds, no organomegaly Rectal: Omitted Extremities: no clubbing, cyanosis, or lower extremity edema bilaterally Skin: no lesions on visible extremities Neuro: No focal deficits.  Cranial nerves intact  ASSESSMENT:  1.  Recent problems with epigastric discomfort likely secondary to GERD and current gastritis.  No alarm features.  Resolved with PPI 2.  EGD August 2023 as described.  H. pylori gastritis.  Treated. 3.  History of diverticular bleed August 2023 4.  Colonoscopy 2019 with diverticulosis  PLAN:  1.  Reflux precautions 2.  Prescribe pantoprazole 40 mg daily.  #30.  11 refills.  Medication effects and risks reviewed 3.  Resume general medical care with Dr. Jenny Reichmann.  GI follow-up as needed Total time of 30 minutes was spent.  See the patient, reviewing data, obtaining comprehensive history, performing medically appropriate physical examination, counseling and educating the patient regarding the above listed issues, ordering medication, and documenting clinical information in the health record

## 2022-08-15 NOTE — Telephone Encounter (Signed)
Please review and advise.

## 2022-08-17 MED ORDER — SPIRONOLACTONE 25 MG PO TABS: 12.5000 mg | ORAL_TABLET | Freq: Every day | ORAL | 3 refills | Status: DC

## 2022-08-17 NOTE — Addendum Note (Signed)
Addended by: Marlene Lard on: 08/17/2022 04:15 PM   Modules accepted: Orders

## 2022-08-20 ENCOUNTER — Other Ambulatory Visit: Payer: Self-pay | Admitting: Internal Medicine

## 2022-08-21 DIAGNOSIS — N401 Enlarged prostate with lower urinary tract symptoms: Secondary | ICD-10-CM | POA: Diagnosis not present

## 2022-08-29 NOTE — Telephone Encounter (Signed)
Pt. Responding to you!  

## 2022-08-29 NOTE — Telephone Encounter (Signed)
Based on new BP's okay to continue medications

## 2022-09-01 ENCOUNTER — Ambulatory Visit (HOSPITAL_COMMUNITY)
Admission: RE | Admit: 2022-09-01 | Discharge: 2022-09-01 | Disposition: A | Discharge status: Home / Self Care | Payer: Medicare Other | Source: Ambulatory Visit | Attending: Nurse Practitioner | Admitting: Nurse Practitioner

## 2022-09-01 ENCOUNTER — Encounter (HOSPITAL_COMMUNITY): Payer: Self-pay

## 2022-09-01 VITALS — BP 168/93 | HR 50 | Temp 98.1°F | Resp 16

## 2022-09-01 DIAGNOSIS — S46911A Strain of unspecified muscle, fascia and tendon at shoulder and upper arm level, right arm, initial encounter: Secondary | ICD-10-CM | POA: Diagnosis not present

## 2022-09-01 MED ORDER — TIZANIDINE HCL 2 MG PO TABS: 2.0000 mg | ORAL_TABLET | Freq: Every evening | ORAL | 0 refills | Status: DC | PRN

## 2022-09-01 NOTE — ED Triage Notes (Signed)
Pt states he is having right shoulder pain x 1 week. He was pulling a chainsaw string to start it, when he felt the pain. He has been taking tylenol as needed.

## 2022-09-01 NOTE — ED Provider Notes (Signed)
[facility]    CSN: [id number] Arrival date & time: 09/01/22  1347      History   Chief Complaint Chief Complaint  Patient presents with   Shoulder Pain    HPI [name] [name] is a 72 y.o. male.   Patient presents today for right shoulder pain for the past week.  Reports pain began shortly after pulling on a chainsaw string while he was working on it.  No recent fall, injury, or known trauma to the shoulder.  Reports it hurts when he moves his arm out or up above his head.  No weakness, numbness, decreased grip strength, redness, swelling, bruising, or fevers, nausea/vomiting since pain began.  Reports he has taken Tylenol which has helped with the pain a little bit.  No history of surgeries on the shoulder.    Past Medical History:  Diagnosis Date   BENIGN PROSTATIC HYPERTROPHY 05/06/2010   COMMON MIGRAINE 07/31/2007   GERD 07/31/2007   HYPERLIPIDEMIA 01/04/2007   HYPERTENSION 01/04/2007   OVERACTIVE BLADDER 03/06/2007   Prostate cancer (HCC) 02/04/2015    Patient Active Problem List   Diagnosis Date Noted   SVT (supraventricular tachycardia) 07/06/2022   Dyspnea 04/23/2022   Anxiety 04/23/2022   Asthma 03/28/2022   Hypotension 12/11/2021   Diverticulosis of intestine with bleeding 12/11/2021   GI bleed 12/10/2021   Primary hypertension 12/10/2021   Acute blood loss anemia 12/10/2021   Lower GI bleeding 12/08/2021   Bradycardia 07/20/2021   Myofascial pain syndrome, cervical 04/19/2021   Vitamin D deficiency 09/25/2019   Dysuria 05/15/2019   Palpitation 02/25/2019   Paroxysmal tachycardia (HCC) 02/25/2019   Tachycardia 01/14/2019   Generalized anxiety disorder with panic attacks 09/25/2018   Mid back pain on right side 05/14/2018   Hyperglycemia 03/26/2018   Low back pain 04/19/2017   Lightheadedness 10/26/2016   Eustachian tube dysfunction, right 08/17/2016   Allergic rhinitis 08/17/2016   Erectile dysfunction 08/17/2016   Diastolic  dysfunction 05/17/2016   Syncope 04/04/2016   Weakness 03/26/2015   Malignant neoplasm of prostate (HCC) 02/23/2015   Chest pain 10/01/2013   Cough 08/04/2013   Asthmatic bronchitis 07/15/2013   Microhematuria 05/09/2012   Constipation 08/13/2011   Ventral hernia 08/13/2011   Umbilical hernia 08/13/2011   Preventative health care 04/25/2011   Nocturia 06/29/2010   BPH (benign prostatic hyperplasia) 05/06/2010   BACK PAIN 05/06/2010   SKIN LESION 11/30/2008   FREQUENCY, URINARY 11/30/2008   COMMON MIGRAINE 07/31/2007   GERD 07/31/2007   OVERACTIVE BLADDER 03/06/2007   Hypercholesterolemia 01/04/2007   Labile hypertension 01/04/2007    Past Surgical History:  Procedure Laterality Date   BIOPSY  12/11/2021   Procedure: BIOPSY;  Surgeon: Lynann Bologna, MD;  Location: [facility] ENDOSCOPY;  Service: Gastroenterology;;   COLONOSCOPY  2009   ESOPHAGOGASTRODUODENOSCOPY (EGD) WITH PROPOFOL N/A 12/11/2021   Procedure: ESOPHAGOGASTRODUODENOSCOPY (EGD) WITH PROPOFOL;  Surgeon: Lynann Bologna, MD;  Location: [facility] ENDOSCOPY;  Service: Gastroenterology;  Laterality: N/A;       Home Medications    Prior to Admission medications   Medication Sig Start Date End Date Taking? Authorizing Provider  aspirin 81 MG tablet Take 1 tablet (81 mg total) by mouth daily. 12/18/21  Yes Narda Bonds, MD  atorvastatin (LIPITOR) 40 MG tablet Take 1 tablet (40 mg total) by mouth daily. 07/20/21  Yes Corwin Levins, MD  benazepril (LOTENSIN) 40 MG tablet Take 40 mg by mouth daily.   Yes [provider]  dorzolamide-timolol (COSOPT) 22.3-6.8  MG/ML ophthalmic solution Place 1 drop into both eyes 2 (two) times daily. 09/24/20  Yes [provider]  GEMTESA 75 MG TABS Take 75 mg by mouth daily. SAMPLES 11/29/21  Yes [provider]  ketorolac (ACULAR) 0.5 % ophthalmic solution Place 1 drop into the left eye 4 (four) times daily. 12/16/21  Yes [provider]  latanoprost (XALATAN) 0.005 %  ophthalmic solution Place 1 drop into both eyes at bedtime. 11/09/17  Yes [provider]  pantoprazole (PROTONIX) 20 MG tablet Take 1 tablet (20 mg total) by mouth daily for 14 days. 07/24/22 09/01/22 Yes Rising, Rebecca, PA-C  potassium chloride (KLOR-CON) 10 MEQ tablet TAKE 1 TABLET BY MOUTH EVERY DAY 08/22/22  Yes Corwin Levins, MD  sildenafil (REVATIO) 20 MG tablet TAKE THREE TABLETS BY MOUTH AS NEEDED Patient taking differently: Take 60 mg by mouth as needed (erectile dysfunction). 04/26/21  Yes Corwin Levins, MD  spironolactone (ALDACTONE) 25 MG tablet Take 0.5 tablets (12.5 mg total) by mouth daily. 08/17/22 12/15/22 Yes Alvstad, Kristin L, RPH-CPP  SYSTANE ULTRA 0.4-0.3 % SOLN Place 1 drop into both eyes daily as needed (dryness). 08/14/19  Yes [provider]  tiZANidine (ZANAFLEX) 2 MG tablet Take 1 tablet (2 mg total) by mouth at bedtime as needed for muscle spasms. Do not take with alcohol or while driving or operating heavy machinery.  May cause drowsiness. 09/01/22  Yes Valentino Nose, NP  benzonatate (TESSALON) 100 MG capsule Take 1 capsule (100 mg total) by mouth every 8 (eight) hours. Patient not taking: Reported on 08/08/2022 03/25/22   Raspet, Noberto Retort, PA-C  Cholecalciferol 50 MCG (2000 UT) TABS 1 tab by mouth once daily Patient taking differently: Take 2,000 Units by mouth daily. 07/20/21   Corwin Levins, MD  pantoprazole (PROTONIX) 40 MG tablet Take 1 tablet (40 mg total) by mouth daily. 08/08/22   Hilarie Fredrickson, MD  polyethylene glycol [facility] / Ethelene Hal) packet Take 17 g by mouth as needed for mild constipation.    [provider]    Family History Family History  Problem Relation Age of Onset   Lung cancer Father 40   Heart disease Brother 6       died from MI    Colon cancer Neg Hx    Esophageal cancer Neg Hx    Rectal cancer Neg Hx    Stomach cancer Neg Hx     Social History Social History   Tobacco Use   Smoking status: Never   Smokeless  tobacco: Never  Vaping Use   Vaping Use: Never used  Substance Use Topics   Alcohol use: No    Alcohol/week: 0.0 standard drinks of alcohol   Drug use: No     Allergies   Beta adrenergic blockers, Celebrex [celecoxib], Crestor [rosuvastatin], Detrol [tolterodine], Ditropan [oxybutynin], Hydralazine, Norvasc [amlodipine besylate], Rapaflo [silodosin], Tadalafil, Tamsulosin, and Toprol xl [metoprolol]   Review of Systems Review of Systems Per HPI  Physical Exam Triage Vital Signs ED Triage Vitals  Enc Vitals Group     BP 09/01/22 1520 (!) 168/93     Pulse Rate 09/01/22 1520 (!) 50     Resp 09/01/22 1520 16     Temp 09/01/22 1520 98.1 F (36.7 C)     Temp Source 09/01/22 1520 Oral     SpO2 09/01/22 1520 98 %     Weight --      Height --      Head Circumference --  Peak Flow --      Pain Score 09/01/22 1517 9     Pain Loc --      Pain Edu? --      Excl. in GC? --    No data found.  Updated Vital Signs BP (!) 168/93 (BP Location: Right Arm)   Pulse (!) 50   Temp 98.1 F (36.7 C) (Oral)   Resp 16   SpO2 98%   Hr recheck: 55 bpm  Visual Acuity Right Eye Distance:   Left Eye Distance:   Bilateral Distance:    Right Eye Near:   Left Eye Near:    Bilateral Near:     Physical Exam Vitals and nursing note reviewed.  Constitutional:      General: He is not in acute distress.    Appearance: Normal appearance. He is not toxic-appearing.  Pulmonary:     Effort: Pulmonary effort is normal. No respiratory distress.  Musculoskeletal:     Comments: Inspection: No obvious swelling, redness, deformity, bruising to right shoulder.  Palpation: Right shoulder is nontender to palpation; no obvious deformities palpated ROM: Full ROM to right shoulder; pain with abduction laterally Strength: 5/5 bilateral upper extremities Neurovascular: neurovascularly intact in left and right upper extremity   Skin:    General: Skin is warm and dry.     Capillary Refill: Capillary  refill takes less than 2 seconds.     Coloration: Skin is not jaundiced or pale.     Findings: No erythema.  Neurological:     Mental Status: He is alert and oriented to person, place, and time.  Psychiatric:        Behavior: Behavior is cooperative.      UC Treatments / Results  Labs (all labs ordered are listed, but only abnormal results are displayed) Labs Reviewed - No data to display  EKG   Radiology No results found.  Procedures Procedures (including critical care time)  Medications Ordered in UC Medications - No data to display  Initial Impression / Assessment and Plan / UC Course  I have reviewed the triage vital signs and the nursing notes.  Pertinent labs & imaging results that were available during my care of the patient were reviewed by me and considered in my medical decision making (see chart for details).   Patient is well-appearing, afebrile, not tachycardic, not tachypneic, oxygenating well on room air.  Patient is mildly hypertensive today in triage.  1. Strain of right shoulder, initial encounter Suspect muscular strain-treat with Tylenol 500 to 1000 mg every 6 hours as needed for pain Recommended rest and ice to help with pain/swelling Start muscle relaxant Start light range of motion/stretching exercises-handout given Follow-up with sports medicine with no improvement or worsening of symptoms despite treatment  The patient was given the opportunity to ask questions.  All questions answered to their satisfaction.  The patient is in agreement to this plan.    Final Clinical Impressions(s) / UC Diagnoses   Final diagnoses:  Strain of right shoulder, initial encounter     Discharge Instructions      As we discussed, I suspect you have a shoulder strain.  You can take Tylenol [phone] mg every 6 hours as needed for pain.  Start the shoulder exercises and ice your shoulder 15 minutes on, 45 minutes off every hour while awake.  Before bed, you can  take the tizanidine to help relax your muscles.  If your symptoms do not improve with this treatment, recommend following  up with sports medicine-contact information is below.    ED Prescriptions     Medication Sig Dispense Auth. Provider   tiZANidine (ZANAFLEX) 2 MG tablet Take 1 tablet (2 mg total) by mouth at bedtime as needed for muscle spasms. Do not take with alcohol or while driving or operating heavy machinery.  May cause drowsiness. 30 tablet Valentino Nose, NP      PDMP not reviewed this encounter.   Valentino Nose, NP 09/01/22 1659

## 2022-09-01 NOTE — Discharge Instructions (Signed)
As we discussed, I suspect you have a shoulder strain.  You can take Tylenol [phone] mg every 6 hours as needed for pain.  Start the shoulder exercises and ice your shoulder 15 minutes on, 45 minutes off every hour while awake.  Before bed, you can take the tizanidine to help relax your muscles.  If your symptoms do not improve with this treatment, recommend following up with sports medicine-contact information is below.

## 2022-09-14 ENCOUNTER — Ambulatory Visit
Admission: RE | Admit: 2022-09-14 | Discharge: 2022-09-14 | Disposition: A | Discharge status: Home / Self Care | Payer: Medicare Other | Source: Ambulatory Visit | Attending: Sports Medicine | Admitting: Sports Medicine

## 2022-09-14 ENCOUNTER — Ambulatory Visit (INDEPENDENT_AMBULATORY_CARE_PROVIDER_SITE_OTHER): Payer: Medicare Other | Admitting: Sports Medicine

## 2022-09-14 VITALS — BP 150/84 | Ht 67.0 in | Wt 195.0 lb

## 2022-09-14 DIAGNOSIS — M25511 Pain in right shoulder: Secondary | ICD-10-CM

## 2022-09-14 NOTE — Progress Notes (Addendum)
   Subjective:    Patient ID: [name] [name], male    DOB: 02/17/1951, 72 y.o.   MRN: [id number]  HPI chief complaint: Right shoulder pain  Very pleasant 72 year old right-hand-dominant male presents today complaining of right shoulder pain that has been present now for approximately 3 weeks.  Pain began shortly after he was trying to start a chainsaw.  This required multiple pulls on the chainsaw to get started.  He denies any injury at that time, but within a day or 2 he began to have diffuse shoulder pain and limited range of motion.  He was seen at an urgent care on April 26.  Was prescribed a muscle relaxer and told to use over-the-counter Tylenol.  He was also instructed to follow-up with [place].  Since then, his pain has not improved.  He has great difficulty reaching overhead or lifting any object that has any sort of weight to it.  Pain at night as well.  He denies any problems with his shoulder in the past.  He denies numbness or tingling.  No prior shoulder surgery.  Past medical history reviewed Medications reviewed Allergies reviewed   Review of Systems As above    Objective:   Physical Exam  Well-developed, well-nourished.  No acute distress  Right shoulder: Patient has extremely limited active forward flexion and abduction.  Passive forward flexion and abduction is better.  Actively he is only able to get to about 70 degrees.  Passively I can carry him to 110 to 120 degrees.  3-4/5 strength with resisted supraspinatus on the right compared to 5/5 on the left.  He does have 5/5 strength with resisted external rotation and internal rotation on the right.  No tenderness to palpation.  No atrophy.  Good pulses distally.      Assessment & Plan:   Right shoulder pain and weakness worrisome for rotator cuff tear  X-rays and MRI of the right shoulder specifically to rule out a full-thickness rotator cuff tear.  I will MyChart message him with those results when available.  We will  delineate further treatment at that time.  In the meantime, I would like for him to perform pendulum exercises to keep the shoulder moving to help prevent stiffness.  This note was dictated using Dragon naturally speaking software and may contain errors in syntax, spelling, or content which have not been identified prior to signing this note.   Addendum: X-ray reviewed.  Nothing acute.  AC joint osteoarthritis noted.  Proceed with MRI as scheduled to rule out full-thickness rotator cuff tear.

## 2022-09-22 ENCOUNTER — Ambulatory Visit
Admission: RE | Admit: 2022-09-22 | Discharge: 2022-09-22 | Disposition: A | Discharge status: Home / Self Care | Payer: Medicare Other | Source: Ambulatory Visit | Attending: Sports Medicine | Admitting: Sports Medicine

## 2022-09-22 DIAGNOSIS — M19011 Primary osteoarthritis, right shoulder: Secondary | ICD-10-CM | POA: Diagnosis not present

## 2022-09-22 DIAGNOSIS — M19019 Primary osteoarthritis, unspecified shoulder: Secondary | ICD-10-CM | POA: Diagnosis not present

## 2022-09-22 DIAGNOSIS — M25511 Pain in right shoulder: Secondary | ICD-10-CM

## 2022-09-22 DIAGNOSIS — M7580 Other shoulder lesions, unspecified shoulder: Secondary | ICD-10-CM | POA: Diagnosis not present

## 2022-09-25 ENCOUNTER — Ambulatory Visit (INDEPENDENT_AMBULATORY_CARE_PROVIDER_SITE_OTHER): Payer: Medicare Other | Admitting: Cardiology

## 2022-09-25 ENCOUNTER — Encounter (HOSPITAL_BASED_OUTPATIENT_CLINIC_OR_DEPARTMENT_OTHER): Payer: Self-pay | Admitting: Cardiology

## 2022-09-25 VITALS — BP 134/78 | HR 62 | Ht 67.0 in | Wt 201.0 lb

## 2022-09-25 DIAGNOSIS — E7800 Pure hypercholesterolemia, unspecified: Secondary | ICD-10-CM | POA: Diagnosis not present

## 2022-09-25 DIAGNOSIS — I471 Supraventricular tachycardia, unspecified: Secondary | ICD-10-CM | POA: Diagnosis not present

## 2022-09-25 DIAGNOSIS — I10 Essential (primary) hypertension: Secondary | ICD-10-CM

## 2022-09-25 DIAGNOSIS — Z7189 Other specified counseling: Secondary | ICD-10-CM

## 2022-09-25 MED ORDER — DILTIAZEM HCL ER COATED BEADS 120 MG PO CP24
120.0000 mg | ORAL_CAPSULE | Freq: Every day | ORAL | 3 refills | Status: DC
Start: 2022-09-25 — End: 2023-06-11

## 2022-09-25 MED ORDER — SPIRONOLACTONE 25 MG PO TABS
12.5000 mg | ORAL_TABLET | Freq: Every day | ORAL | 3 refills | Status: DC
Start: 2022-09-25 — End: 2023-03-29

## 2022-09-25 NOTE — Patient Instructions (Signed)
Medication Instructions:  Your physician recommends that you continue on your current medications as directed. Please refer to the Current Medication list given to you today.  *If you need a refill on your cardiac medications before your next appointment, please call your pharmacy*  Follow-Up: At [facility], you and your health needs are our priority.  As part of our continuing mission to provide you with exceptional heart care, we have created designated Provider Care Teams.  These Care Teams include your primary Cardiologist (physician) and Advanced Practice Providers (APPs -  Physician Assistants and Nurse Practitioners) who all work together to provide you with the care you need, when you need it.  We recommend signing up for the patient portal called "MyChart".  Sign up information is provided on this After Visit Summary.  MyChart is used to connect with patients for Virtual Visits (Telemedicine).  Patients are able to view lab/test results, encounter notes, upcoming appointments, etc.  Non-urgent messages can be sent to your provider as well.   To learn more about what you can do with MyChart, go to [email or web address].    Your next appointment:   6 month(s)  Provider:   Bridgette Christopher, MD    

## 2022-09-25 NOTE — Progress Notes (Signed)
Cardiology Office Note:    Date:  09/25/2022   ID:  [name] [name], DOB [birth year], MRN [id number]  PCP:  Corwin Levins, MD  Cardiologist:  Jodelle Red, MD  Referring MD: Corwin Levins, MD   CC: follow up  History of Present Illness:    [name] [name] is a 72 y.o. male with a hx of SVT, hypertension, hyperlipidemia, GERD, BPH, and prostate cancer, who is seen for follow up today. I initially met him 05/17/22 as a new consult at the request of Corwin Levins, MD for the evaluation and management of tachycardia.   FH: brother died of MI in his 3s. Mother had CHF.  BP history: Losartan didn't help much.  HCTZ dropped his blood pressure too much in the past and was discontinued.  Today: Doing well overall. Checks blood pressure at home, generally well controlled with only rare highs. Tolerating current meds well. Almost out of diltiazem, not on med list but recommended to continue at visit with Dr. Ladona Ridgel. Reordered today.  Palpitations have been well controlled. Reviewed med list today. Stays active, always working outside, no limtiations.  Denies chest pain, shortness of breath at rest or with normal exertion. No PND, orthopnea, LE edema or unexpected weight gain. No syncope or palpitations.    Past Medical History:  Diagnosis Date   BENIGN PROSTATIC HYPERTROPHY 05/06/2010   COMMON MIGRAINE 07/31/2007   GERD 07/31/2007   HYPERLIPIDEMIA 01/04/2007   HYPERTENSION 01/04/2007   OVERACTIVE BLADDER 03/06/2007   Prostate cancer (HCC) 02/04/2015    Past Surgical History:  Procedure Laterality Date   BIOPSY  12/11/2021   Procedure: BIOPSY;  Surgeon: Lynann Bologna, MD;  Location: [facility] ENDOSCOPY;  Service: Gastroenterology;;   COLONOSCOPY  2009   ESOPHAGOGASTRODUODENOSCOPY (EGD) WITH PROPOFOL N/A 12/11/2021   Procedure: ESOPHAGOGASTRODUODENOSCOPY (EGD) WITH PROPOFOL;  Surgeon: Lynann Bologna, MD;  Location: [facility] ENDOSCOPY;  Service: Gastroenterology;  Laterality: N/A;    Current  Medications: Current Outpatient Medications on File Prior to Visit  Medication Sig   aspirin 81 MG tablet Take 1 tablet (81 mg total) by mouth daily.   atorvastatin (LIPITOR) 40 MG tablet Take 1 tablet (40 mg total) by mouth daily.   benazepril (LOTENSIN) 40 MG tablet Take 40 mg by mouth daily.   benzonatate (TESSALON) 100 MG capsule Take 1 capsule (100 mg total) by mouth every 8 (eight) hours.   Cholecalciferol 50 MCG (2000 UT) TABS 1 tab by mouth once daily (Patient taking differently: Take 2,000 Units by mouth daily.)   dorzolamide-timolol (COSOPT) 22.3-6.8 MG/ML ophthalmic solution Place 1 drop into both eyes 2 (two) times daily.   GEMTESA 75 MG TABS Take 75 mg by mouth daily. SAMPLES   ketorolac (ACULAR) 0.5 % ophthalmic solution Place 1 drop into the left eye 4 (four) times daily.   latanoprost (XALATAN) 0.005 % ophthalmic solution Place 1 drop into both eyes at bedtime.   pantoprazole (PROTONIX) 40 MG tablet Take 1 tablet (40 mg total) by mouth daily.   polyethylene glycol (MIRALAX / GLYCOLAX) packet Take 17 g by mouth as needed for mild constipation.   potassium chloride (KLOR-CON) 10 MEQ tablet TAKE 1 TABLET BY MOUTH EVERY DAY   sildenafil (REVATIO) 20 MG tablet TAKE THREE TABLETS BY MOUTH AS NEEDED (Patient taking differently: Take 60 mg by mouth as needed (erectile dysfunction).)   SYSTANE ULTRA 0.4-0.3 % SOLN Place 1 drop into both eyes daily as needed (dryness).   tiZANidine (ZANAFLEX) 2 MG tablet Take 1  tablet (2 mg total) by mouth at bedtime as needed for muscle spasms. Do not take with alcohol or while driving or operating heavy machinery.  May cause drowsiness.   No current facility-administered medications on file prior to visit.     Allergies:   Beta adrenergic blockers, Celebrex [celecoxib], Crestor [rosuvastatin], Detrol [tolterodine], Ditropan [oxybutynin], Hydralazine, Norvasc [amlodipine besylate], Rapaflo [silodosin], Tadalafil, Tamsulosin, and Toprol xl [metoprolol]    Social History   Tobacco Use   Smoking status: Never   Smokeless tobacco: Never  Vaping Use   Vaping Use: Never used  Substance Use Topics   Alcohol use: No    Alcohol/week: 0.0 standard drinks of alcohol   Drug use: No    Family History: family history includes Heart disease (age of onset: 18) in his brother; Lung cancer (age of onset: 66) in his father. There is no history of Colon cancer, Esophageal cancer, Rectal cancer, or Stomach cancer.  ROS:   Please see the history of present illness.  Additional pertinent ROS otherwise unremarkable.  EKGs/Labs/Other Studies Reviewed:    The following studies were reviewed today: Echo 01/23/19  1. Left ventricular ejection fraction, by visual estimation, is 60 to 65%. The left ventricle has normal function. Left ventricular septal wall thickness was normal. Normal left ventricular posterior wall thickness. There is no left ventricular  hypertrophy.  2. Left ventricular diastolic Doppler parameters are consistent with pseudonormalization pattern of LV diastolic filling.  3. GLS = -19.0%.  4. Global right ventricle has normal systolic function.The right ventricular size is normal. No increase in right ventricular wall thickness.  5. Left atrial size was normal.  6. Right atrial size was normal.  7. The mitral valve is normal in structure. Trace mitral valve regurgitation.  8. The tricuspid valve is normal in structure. Tricuspid valve regurgitation was not visualized by color flow Doppler.  9. The aortic valve The aortic valve is tricuspid Aortic valve regurgitation was not visualized by color flow Doppler. Mild aortic valve sclerosis without stenosis. 10. The pulmonic valve was normal in structure. Pulmonic valve regurgitation is not visualized by color flow Doppler. 11. The atrial septum is grossly normal.   MPI 12/02/13 Low risk stress nuclear study with a very small reversible defect in the mid and basal inferior wall which is most  likely related to variations in diaphragmatic attenunation but cannot rule out a very small area of ischemia..   LV Ejection Fraction: 57%.  LV Wall Motion:  NL LV Function; NL Wall Motion  EKG:  EKG is personally reviewed.   09/25/22: ECG not ordered today.  06/22/2022: The EKG was not ordered. 05/17/2022:  SR with 1st degree AV block, nonspecific T pattern, 68 bpm 02/25/2019:  SR with 1st degree AV block, nonspecific TWI lateral leads, unchanged from prior   Recent Labs: 12/10/2021: ALT 18 06/08/2022: BUN 13; Creatinine, Ser 1.15; Hemoglobin 13.1; Magnesium 2.1; Platelets 299; Potassium 3.7; Sodium 143; TSH 1.290   Recent Lipid Panel    Component Value Date/Time   CHOL 151 10/17/2021 0921   TRIG 66.0 10/17/2021 0921   HDL 57.40 10/17/2021 0921   CHOLHDL 3 10/17/2021 0921   VLDL 13.2 10/17/2021 0921   LDLCALC 80 10/17/2021 0921   LDLDIRECT 139.2 10/29/2012 1421    Physical Exam:    VS:  BP 134/78   Pulse 62   Ht 5\' 7"  (1.702 m)   Wt 201 lb (91.2 kg)   BMI 31.48 kg/m     Wt Readings from  Last 3 Encounters:  09/25/22 201 lb (91.2 kg)  09/14/22 195 lb (88.5 kg)  08/08/22 203 lb (92.1 kg)    GEN: Well nourished, well developed in no acute distress HEENT: Normal, moist mucous membranes NECK: No JVD CARDIAC: regular rhythm, normal S1 and S2, no rubs or gallops. No murmur. VASCULAR: Radial and DP pulses 2+ bilaterally. No carotid bruits RESPIRATORY:  Clear to auscultation without rales, wheezing or rhonchi  ABDOMEN: Soft, non-tender, non-distended MUSCULOSKELETAL:  Ambulates independently SKIN: Warm and dry, no edema NEUROLOGIC:  Alert and oriented x 3. No focal neuro deficits noted. PSYCHIATRIC:  Normal affect    ASSESSMENT:    1. SVT (supraventricular tachycardia)   2. Primary hypertension   3. Pure hypercholesterolemia   4. Cardiac risk counseling   5. Counseling on health promotion and disease prevention    PLAN:    Intermittent SVT, palpitations:  -well  controlled currently -continue diltiazem -prior echo without significant abnormalities -prior MPI 2015, but no chest pain/anginal symptoms   Hypertension: improved today -has been well controlled on benazepril and spironolactone -checks home BP   Hypercholesterolemia: elevated ASCVD risk -LDL 80 10/2021, at goal (<100) -continue atorvastatin 20 mg daily, aspirin 81 mg daily (this is optional, but with hyperglycemia/nearing diabetes, reasonable to continue as he reports no bleeding issues)  Cardiac risk counseling and prevention recommendations: -recommend heart healthy/Mediterranean diet, with whole grains, fruits, vegetable, fish, lean meats, nuts, and olive oil. Limit salt. -recommend moderate walking, 3-5 times/week for 30-50 minutes each session. Aim for at least 150 minutes.week. Goal should be pace of 3 miles/hours, or walking 1.5 miles in 30 minutes -recommend avoidance of tobacco products. Avoid excess alcohol. -ASCVD risk score: The 10-year ASCVD risk score (Arnett DK, et al., 2019) is: 20.7%   Values used to calculate the score:     Age: 23 years     Sex: Male     Is Non-Hispanic African American: Yes     Diabetic: No     Tobacco smoker: No     Systolic Blood Pressure: 134 mmHg     Is BP treated: Yes     HDL Cholesterol: 57.4 mg/dL     Total Cholesterol: 151 mg/dL    Plan for follow up: 6 months or sooner as needed.  Jodelle Red, MD, PhD, [employer] [place]  [facility]    Medication Adjustments/Labs and Tests Ordered: Current medicines are reviewed at length with the patient today.  Concerns regarding medicines are outlined above.   No orders of the defined types were placed in this encounter.  Meds ordered this encounter  Medications   diltiazem (CARDIZEM CD) 120 MG 24 hr capsule    Sig: Take 1 capsule (120 mg total) by mouth daily.    Dispense:  90 capsule    Refill:  3   spironolactone (ALDACTONE) 25 MG tablet    Sig: Take 0.5 tablets (12.5 mg  total) by mouth daily.    Dispense:  45 tablet    Refill:  3   Patient Instructions  Medication Instructions:  Your physician recommends that you continue on your current medications as directed. Please refer to the Current Medication list given to you today.  *If you need a refill on your cardiac medications before your next appointment, please call your pharmacy  Follow-Up: At [facility], you and your health needs are our priority.  As part of our continuing mission to provide you with exceptional heart care, we have created designated Provider  Care Teams.  These Care Teams include your primary Cardiologist (physician) and Advanced Practice Providers (APPs -  Physician Assistants and Nurse Practitioners) who all work together to provide you with the care you need, when you need it.  We recommend signing up for the patient portal called "MyChart".  Sign up information is provided on this After Visit Summary.  MyChart is used to connect with patients for Virtual Visits (Telemedicine).  Patients are able to view lab/test results, encounter notes, upcoming appointments, etc.  Non-urgent messages can be sent to your provider as well.   To learn more about what you can do with MyChart, go to [email or web address].    Your next appointment:   6 month(s)  Provider:   Jodelle Red, MD

## 2022-09-30 NOTE — Progress Notes (Signed)
none

## 2022-10-03 ENCOUNTER — Ambulatory Visit (INDEPENDENT_AMBULATORY_CARE_PROVIDER_SITE_OTHER): Payer: Medicare Other | Admitting: Sports Medicine

## 2022-10-03 VITALS — BP 168/94 | Ht 67.0 in | Wt 195.0 lb

## 2022-10-03 DIAGNOSIS — M25511 Pain in right shoulder: Secondary | ICD-10-CM | POA: Diagnosis not present

## 2022-10-03 NOTE — Progress Notes (Signed)
   Subjective:    Patient ID: [name] [name], male    DOB: 03-11-1951, 72 y.o.   MRN: [id number]  HPI  Patient comes in at my request to discuss MRI findings of his right shoulder.  MRI does show some rotator cuff tendinopathy in the setting of AC joint arthropathy but there is no evidence of full-thickness rotator cuff tearing.  Patient states that he is improving.  He still has some soreness but will take a couple of Advil which helps.  His mobility is much better.  He does still have some pain at night.    Review of Systems As above    Objective:   Physical Exam  Well-developed, well-nourished.  No acute distress  Right shoulder: Full active range of motion.  No tenderness to palpation.  Rotator cuff testing today shows 5/5 strength bilaterally.  Negative O'Brien's.  Neurovascularly intact distally.      Assessment & Plan:   Improving right shoulder pain secondary to rotator cuff tendinopathy  Given the patient's overall improvement in symptoms, we will wait on further workup and treatment at this time.  He may continue with over-the-counter Advil as needed.  I did discuss merits of a subacromial cortisone injection should his symptoms not continue to improve.  He may continue to resume activity as symptoms allow.  He will follow-up with me for ongoing or recalcitrant issues.  This note was dictated using Dragon naturally speaking software and may contain errors in syntax, spelling, or content which have not been identified prior to signing this note.

## 2022-10-04 ENCOUNTER — Ambulatory Visit (INDEPENDENT_AMBULATORY_CARE_PROVIDER_SITE_OTHER): Payer: Medicare Other

## 2022-10-04 VITALS — Ht 67.0 in | Wt 201.0 lb

## 2022-10-04 DIAGNOSIS — Z0000 Encounter for general adult medical examination without abnormal findings: Secondary | ICD-10-CM | POA: Diagnosis not present

## 2022-10-04 NOTE — Patient Instructions (Addendum)
[name] , Thank you for taking time to come for your Medicare Wellness Visit. I appreciate your ongoing commitment to your health goals. Please review the following plan we discussed and let me know if I can assist you in the future.   These are the goals we discussed:  Goals      My goal for 2024 is to lose 10 pounds.        This is a list of the screening recommended for you and due dates:  Health Maintenance  Topic Date Due   Zoster (Shingles) Vaccine (1 of 2) Never done   Flu Shot  12/07/2022   Medicare Annual Wellness Visit  10/04/2023   Colon Cancer Screening  12/20/2027   DTaP/Tdap/Td vaccine (3 - Td or Tdap) 06/12/2028   Pneumonia Vaccine  Completed   Hepatitis C Screening  Completed   HPV Vaccine  Aged Out   COVID-19 Vaccine  Discontinued    Advanced directives: Yes  Conditions/risks identified: Yes  Next appointment: Follow up in one year for your annual wellness visit.   Preventive Care 32 Years and Older, Male  Preventive care refers to lifestyle choices and visits with your health care provider that can promote health and wellness. What does preventive care include? A yearly physical exam. This is also called an annual well check. Dental exams once or twice a year. Routine eye exams. Ask your health care provider how often you should have your eyes checked. Personal lifestyle choices, including: Daily care of your teeth and gums. Regular physical activity. Eating a healthy diet. Avoiding tobacco and drug use. Limiting alcohol use. Practicing safe sex. Taking low doses of aspirin every day. Taking vitamin and mineral supplements as recommended by your health care provider. What happens during an annual well check? The services and screenings done by your health care provider during your annual well check will depend on your age, overall health, lifestyle risk factors, and family history of disease. Counseling  Your health care provider may ask you  questions about your: Alcohol use. Tobacco use. Drug use. Emotional well-being. Home and relationship well-being. Sexual activity. Eating habits. History of falls. Memory and ability to understand (cognition). Work and work Astronomer. Screening  You may have the following tests or measurements: Height, weight, and BMI. Blood pressure. Lipid and cholesterol levels. These may be checked every 5 years, or more frequently if you are over 56 years old. Skin check. Lung cancer screening. You may have this screening every year starting at age 14 if you have a 30-pack-year history of smoking and currently smoke or have quit within the past 15 years. Fecal occult blood test (FOBT) of the stool. You may have this test every year starting at age 83. Flexible sigmoidoscopy or colonoscopy. You may have a sigmoidoscopy every 5 years or a colonoscopy every 10 years starting at age 72. Prostate cancer screening. Recommendations will vary depending on your family history and other risks. Hepatitis C blood test. Hepatitis B blood test. Sexually transmitted disease (STD) testing. Diabetes screening. This is done by checking your blood sugar (glucose) after you have not eaten for a while (fasting). You may have this done every 1-3 years. Abdominal aortic aneurysm (AAA) screening. You may need this if you are a current or former smoker. Osteoporosis. You may be screened starting at age 24 if you are at high risk. Talk with your health care provider about your test results, treatment options, and if necessary, the need for more tests.  Vaccines  Your health care provider may recommend certain vaccines, such as: Influenza vaccine. This is recommended every year. Tetanus, diphtheria, and acellular pertussis (Tdap, Td) vaccine. You may need a Td booster every 10 years. Zoster vaccine. You may need this after age 40. Pneumococcal 13-valent conjugate (PCV13) vaccine. One dose is recommended after age  66. Pneumococcal polysaccharide (PPSV23) vaccine. One dose is recommended after age 23. Talk to your health care provider about which screenings and vaccines you need and how often you need them. This information is not intended to replace advice given to you by your health care provider. Make sure you discuss any questions you have with your health care provider. Document Released: 05/21/2015 Document Revised: 01/12/2016 Document Reviewed: 02/23/2015 Elsevier Interactive Patient Education  2017 [employer].  Fall Prevention in the Home Falls can cause injuries. They can happen to people of all ages. There are many things you can do to make your home safe and to help prevent falls. What can I do on the outside of my home? Regularly fix the edges of walkways and driveways and fix any cracks. Remove anything that might make you trip as you walk through a door, such as a raised step or threshold. Trim any bushes or trees on the path to your home. Use bright outdoor lighting. Clear any walking paths of anything that might make someone trip, such as rocks or tools. Regularly check to see if handrails are loose or broken. Make sure that both sides of any steps have handrails. Any raised decks and porches should have guardrails on the edges. Have any leaves, snow, or ice cleared regularly. Use sand or salt on walking paths during winter. Clean up any spills in your garage right away. This includes oil or grease spills. What can I do in the bathroom? Use night lights. Install grab bars by the toilet and in the tub and shower. Do not use towel bars as grab bars. Use non-skid mats or decals in the tub or shower. If you need to sit down in the shower, use a plastic, non-slip stool. Keep the floor dry. Clean up any water that spills on the floor as soon as it happens. Remove soap buildup in the tub or shower regularly. Attach bath mats securely with double-sided non-slip rug tape. Do not have throw  rugs and other things on the floor that can make you trip. What can I do in the bedroom? Use night lights. Make sure that you have a light by your bed that is easy to reach. Do not use any sheets or blankets that are too big for your bed. They should not hang down onto the floor. Have a firm chair that has side arms. You can use this for support while you get dressed. Do not have throw rugs and other things on the floor that can make you trip. What can I do in the kitchen? Clean up any spills right away. Avoid walking on wet floors. Keep items that you use a lot in easy-to-reach places. If you need to reach something above you, use a strong step stool that has a grab bar. Keep electrical cords out of the way. Do not use floor polish or wax that makes floors slippery. If you must use wax, use non-skid floor wax. Do not have throw rugs and other things on the floor that can make you trip. What can I do with my stairs? Do not leave any items on the stairs. Make sure that  there are handrails on both sides of the stairs and use them. Fix handrails that are broken or loose. Make sure that handrails are as long as the stairways. Check any carpeting to make sure that it is firmly attached to the stairs. Fix any carpet that is loose or worn. Avoid having throw rugs at the top or bottom of the stairs. If you do have throw rugs, attach them to the floor with carpet tape. Make sure that you have a light switch at the top of the stairs and the bottom of the stairs. If you do not have them, ask someone to add them for you. What else can I do to help prevent falls? Wear shoes that: Do not have high heels. Have rubber bottoms. Are comfortable and fit you well. Are closed at the toe. Do not wear sandals. If you use a stepladder: Make sure that it is fully opened. Do not climb a closed stepladder. Make sure that both sides of the stepladder are locked into place. Ask someone to hold it for you, if  possible. Clearly mark and make sure that you can see: Any grab bars or handrails. First and last steps. Where the edge of each step is. Use tools that help you move around (mobility aids) if they are needed. These include: Canes. Walkers. Scooters. Crutches. Turn on the lights when you go into a dark area. Replace any light bulbs as soon as they burn out. Set up your furniture so you have a clear path. Avoid moving your furniture around. If any of your floors are uneven, fix them. If there are any pets around you, be aware of where they are. Review your medicines with your doctor. Some medicines can make you feel dizzy. This can increase your chance of falling. Ask your doctor what other things that you can do to help prevent falls. This information is not intended to replace advice given to you by your health care provider. Make sure you discuss any questions you have with your health care provider. Document Released: 02/18/2009 Document Revised: 09/30/2015 Document Reviewed: 05/29/2014 Elsevier Interactive Patient Education  2017 [employer].

## 2022-10-04 NOTE — Progress Notes (Signed)
I connected with  [name] [name] on 10/04/22 by a audio enabled telemedicine application and verified that I am speaking with the correct person using two identifiers.  Patient Location: Home  Provider Location: Office/Clinic  I discussed the limitations of evaluation and management by telemedicine. The patient expressed understanding and agreed to proceed.  Patient Medicare AWV questionnaire was completed by the patient on 10/04/2022; I have confirmed that all information answered by patient is correct and no changes since this date.     Subjective:   [name] [name] is a 72 y.o. male who presents for Medicare Annual/Subsequent preventive examination.  Review of Systems     Cardiac Risk Factors include: advanced age (>58men, >110 women);dyslipidemia;family history of premature cardiovascular disease;hypertension;male gender;obesity (BMI >30kg/m2)     Objective:    Today's Vitals   10/04/22 1505 10/04/22 1506  Weight: 201 lb (91.2 kg)   Height: 5\' 7"  (1.702 m)   PainSc: 1  1   PainLoc: Shoulder    Body mass index is 31.48 kg/m.     10/04/2022    3:08 PM 05/31/2022    2:20 PM 12/11/2021   12:00 PM 12/11/2021    8:23 AM 09/30/2021   10:24 AM 09/25/2018   10:33 AM 08/13/2017    9:34 AM  Advanced Directives  Does Patient Have a Medical Advance Directive? Yes Yes Yes Yes No No No  Type of Estate agent of [place];Living will Healthcare Power of [place];Living will Living will;Healthcare Power of Attorney Living will;Healthcare Power of Attorney     Does patient want to make changes to medical advance directive?   No - Patient declined    Yes (ED - Information included in AVS)  Copy of Healthcare Power of Attorney in Chart? No - copy requested  No - copy requested No - copy requested     Would patient like information on creating a medical advance directive?     No - Patient declined Yes (ED - Information included in AVS)     Current Medications  (verified) Outpatient Encounter Medications as of 10/04/2022  Medication Sig   aspirin 81 MG tablet Take 1 tablet (81 mg total) by mouth daily.   atorvastatin (LIPITOR) 40 MG tablet Take 1 tablet (40 mg total) by mouth daily.   benazepril (LOTENSIN) 40 MG tablet Take 40 mg by mouth daily.   benzonatate (TESSALON) 100 MG capsule Take 1 capsule (100 mg total) by mouth every 8 (eight) hours.   Cholecalciferol 50 MCG (2000 UT) TABS 1 tab by mouth once daily (Patient taking differently: Take 2,000 Units by mouth daily.)   diltiazem (CARDIZEM CD) 120 MG 24 hr capsule Take 1 capsule (120 mg total) by mouth daily.   dorzolamide-timolol (COSOPT) 22.3-6.8 MG/ML ophthalmic solution Place 1 drop into both eyes 2 (two) times daily.   GEMTESA 75 MG TABS Take 75 mg by mouth daily. SAMPLES   ketorolac (ACULAR) 0.5 % ophthalmic solution Place 1 drop into the left eye 4 (four) times daily.   latanoprost (XALATAN) 0.005 % ophthalmic solution Place 1 drop into both eyes at bedtime.   pantoprazole (PROTONIX) 40 MG tablet Take 1 tablet (40 mg total) by mouth daily.   polyethylene glycol (MIRALAX / GLYCOLAX) packet Take 17 g by mouth as needed for mild constipation.   potassium chloride (KLOR-CON) 10 MEQ tablet TAKE 1 TABLET BY MOUTH EVERY DAY   sildenafil (REVATIO) 20 MG tablet TAKE THREE TABLETS BY MOUTH AS NEEDED (Patient taking differently:  Take 60 mg by mouth as needed (erectile dysfunction).)   spironolactone (ALDACTONE) 25 MG tablet Take 0.5 tablets (12.5 mg total) by mouth daily.   SYSTANE ULTRA 0.4-0.3 % SOLN Place 1 drop into both eyes daily as needed (dryness).   [DISCONTINUED] tiZANidine (ZANAFLEX) 2 MG tablet Take 1 tablet (2 mg total) by mouth at bedtime as needed for muscle spasms. Do not take with alcohol or while driving or operating heavy machinery.  May cause drowsiness.   No facility-administered encounter medications on file as of 10/04/2022.    Allergies (verified) Beta adrenergic blockers,  Celebrex [celecoxib], Crestor [rosuvastatin], Detrol [tolterodine], Ditropan [oxybutynin], Hydralazine, Norvasc [amlodipine besylate], Rapaflo [silodosin], Tadalafil, Tamsulosin, and Toprol xl [metoprolol]   History: Past Medical History:  Diagnosis Date   BENIGN PROSTATIC HYPERTROPHY 05/06/2010   COMMON MIGRAINE 07/31/2007   GERD 07/31/2007   HYPERLIPIDEMIA 01/04/2007   HYPERTENSION 01/04/2007   OVERACTIVE BLADDER 03/06/2007   Prostate cancer (HCC) 02/04/2015   Past Surgical History:  Procedure Laterality Date   BIOPSY  12/11/2021   Procedure: BIOPSY;  Surgeon: Lynann Bologna, MD;  Location: [facility] ENDOSCOPY;  Service: Gastroenterology;;   COLONOSCOPY  2009   ESOPHAGOGASTRODUODENOSCOPY (EGD) WITH PROPOFOL N/A 12/11/2021   Procedure: ESOPHAGOGASTRODUODENOSCOPY (EGD) WITH PROPOFOL;  Surgeon: Lynann Bologna, MD;  Location[facility] ENDOSCOPY;  Service: Gastroenterology;  Laterality: N/A;   Family History  Problem Relation Age of Onset   Lung cancer Father 76   Heart disease Brother 57       died from MI    Colon cancer Neg Hx    Esophageal cancer Neg Hx    Rectal cancer Neg Hx    Stomach cancer Neg Hx    Social History   Socioeconomic History   Marital status: Married    Spouse name: Not on file   Number of children: 3   Years of education: Not on file   Highest education level: Not on file  Occupational History   Occupation: pastor  Tobacco Use   Smoking status: Never   Smokeless tobacco: Never  Vaping Use   Vaping Use: Never used  Substance and Sexual Activity   Alcohol use: No    Alcohol/week: 0.0 standard drinks of alcohol   Drug use: No   Sexual activity: Not Currently  Other Topics Concern   Not on file  Social History Narrative   Not on file   Social Determinants of Health   Financial Resource Strain: Low Risk  (10/04/2022)   Overall Financial Resource Strain (CARDIA)    Difficulty of Paying Living Expenses: Not hard at all  Food Insecurity: No Food Insecurity  (10/04/2022)   Hunger Vital Sign    Worried About Running Out of Food in the Last Year: Never true    Ran Out of Food in the Last Year: Never true  Transportation Needs: No Transportation Needs (10/04/2022)   PRAPARE - Administrator, Civil Service (Medical): No    Lack of Transportation (Non-Medical): No  Physical Activity: Sufficiently Active (10/04/2022)   Exercise Vital Sign    Days of Exercise per Week: 5 days    Minutes of Exercise per Session: 60 min  Stress: No Stress Concern Present (10/04/2022)   [employer] of Occupational Health - Occupational Stress Questionnaire    Feeling of Stress : Not at all  Social Connections: Unknown (10/04/2022)   Social Connection and Isolation Panel [NHANES]    Frequency of Communication with Friends and Family: More than three times a week  Frequency of Social Gatherings with Friends and Family: More than three times a week    Attends Religious Services: Not on file    Active Member of Clubs or Organizations: Yes    Attends Engineer, structural: More than 4 times per year    Marital Status: Married    Tobacco Counseling Counseling given: Not Answered   Clinical Intake:  Pre-visit preparation completed: Yes  Pain : No/denies pain Pain Score: 1      BMI - recorded: 31.48 Nutritional Status: BMI > 30  Obese Nutritional Risks: None Diabetes: No  How often do you need to have someone help you when you read instructions, pamphlets, or other written materials from your doctor or pharmacy?: 1 - Never What is the last grade level you completed in school?: HSG; Past[employer]  Diabetic? No  Interpreter Needed?: No  Information entered by :: Angeles Zehner N. Lee Kalt, LPN.   Activities of Daily Living    10/04/2022    3:09 PM 10/04/2022    6:11 AM  In your present state of health, do you have any difficulty performing the following activities:  Hearing? 0 0  Vision? 0 0  Difficulty concentrating or making decisions? 0  0  Walking or climbing stairs? 0 0  Dressing or bathing? 0 0  Doing errands, shopping? 0 0  Preparing Food and eating ? N N  Using the Toilet? N N  In the past six months, have you accidently leaked urine? N N  Do you have problems with loss of bowel control? N N  Managing your Medications? N N  Managing your Finances? N N  Housekeeping or managing your Housekeeping? N N    Patient Care Team: Corwin Levins, MD as PCP - General Jodelle Red, MD as PCP - Cardiology (Cardiology) Hilarie Fredrickson, MD as Consulting Physician (Gastroenterology) Vivi Barrack, DPM as Consulting Physician (Podiatry)  Indicate any recent Medical Services you may have received from other than [facility] providers in the past year (date may be approximate).     Assessment:   This is a routine wellness examination for [name] [name].  Hearing/Vision screen Hearing Screening - Comments:: Denies hearing difficulties   Vision Screening - Comments:: Wears rx glasses - up to date with routine eye exams with [facility]   Dietary issues and exercise activities discussed: Current Exercise Habits: Home exercise routine, Type of exercise: walking;Other - see comments (yard work, gardening), Time (Minutes): 60, Frequency (Times/Week): 5, Weekly Exercise (Minutes/Week): 300, Intensity: Moderate, Exercise limited by: None identified   Goals Addressed             This Visit's Progress    My goal for 2024 is to lose 10 pounds.        Depression Screen    10/04/2022    3:13 PM 04/21/2022    1:01 PM 03/28/2022    8:24 AM 02/09/2022    2:04 PM 12/08/2021    3:51 PM 10/17/2021    8:43 AM 09/30/2021   10:22 AM  PHQ 2/9 Scores  PHQ - 2 Score 0 0 0 0 0 0 0  PHQ- 9 Score 0 0 0  1 0     Fall Risk    10/04/2022    3:09 PM 10/04/2022    6:11 AM 04/21/2022    1:01 PM 03/28/2022    8:24 AM 02/09/2022    2:04 PM  Fall Risk   Falls in the past year? 0 0 0 0 0  Number falls in past yr: 0 0   0  Injury with Fall? 0   0  0  Risk for fall due to : No Fall Risks  No Fall Risks No Fall Risks No Fall Risks  Follow up Falls prevention discussed  Falls evaluation completed Falls evaluation completed Falls evaluation completed    FALL RISK PREVENTION PERTAINING TO THE HOME:  Any stairs in or around the home? No  If so, are there any without handrails? No  Home free of loose throw rugs in walkways, pet beds, electrical cords, etc? Yes  Adequate lighting in your home to reduce risk of falls? Yes   ASSISTIVE DEVICES UTILIZED TO PREVENT FALLS:  Life alert? No  Use of a cane, walker or w/c? No  Grab bars in the bathroom? No  Shower chair or bench in shower? No  Elevated toilet seat or a handicapped toilet? No   TIMED UP AND GO:  Was the test performed? No . Telephonic Visit  Cognitive Function:        10/04/2022    3:18 PM 09/30/2021   10:25 AM  6CIT Screen  What Year? 0 points 0 points  What month? 0 points 0 points  What time? 0 points 0 points  Count back from 20 0 points 0 points  Months in reverse 0 points 2 points  Repeat phrase 0 points 0 points  Total Score 0 points 2 points    Immunizations Immunization History  Administered Date(s) Administered   Fluad Quad(high Dose 65+) 01/14/2019, 01/10/2022   Influenza Split 03/15/2012   Influenza Whole 03/08/2006, 03/09/2008, 02/05/2009   Influenza, High Dose Seasonal PF 02/07/2018, 02/17/2021   Influenza,inj,Quad PF,6+ Mos 02/03/2013, 02/27/2014, 02/23/2015, 02/24/2016, 02/06/2017   Influenza-Unspecified 02/07/2018   PFIZER(Purple Top)SARS-COV-2 Vaccination 05/20/2019, 06/09/2019   Pfizer Covid-19 Vaccine Bivalent Booster 59yrs & up 02/24/2021, 02/24/2021   Pneumococcal Conjugate-13 07/15/2013, 04/08/2014   Pneumococcal Polysaccharide-23 09/23/2015   Td 04/20/2008   Tdap 06/12/2018   Zoster, Live 05/06/2010    TDAP status: Up to date  Flu Vaccine status: Up to date  Pneumococcal vaccine status: Up to date  Covid-19 vaccine  status: Completed vaccines  Qualifies for Shingles Vaccine? Yes   Zostavax completed Yes   Shingrix Completed?: No.    Education has been provided regarding the importance of this vaccine. Patient has been advised to call insurance company to determine out of pocket expense if they have not yet received this vaccine. Advised may also receive vaccine at local pharmacy or Health Dept. Verbalized acceptance and understanding.  Screening Tests Health Maintenance  Topic Date Due   Zoster Vaccines- Shingrix (1 of 2) Never done   INFLUENZA VACCINE  12/07/2022   Medicare Annual Wellness (AWV)  10/04/2023   Colonoscopy  12/20/2027   DTaP/Tdap/Td (3 - Td or Tdap) 06/12/2028   Pneumonia Vaccine 66+ Years old  Completed   Hepatitis C Screening  Completed   HPV VACCINES  Aged Out   COVID-19 Vaccine  Discontinued    Health Maintenance  Health Maintenance Due  Topic Date Due   Zoster Vaccines- Shingrix (1 of 2) Never done    Colorectal cancer screening: Type of screening: Colonoscopy. Completed 12/19/2017. Repeat every 10 years  Lung Cancer Screening: (Low Dose CT Chest recommended if Age 35-80 years, 30 pack-year currently smoking OR have quit w/in 15years.) does not qualify.   Lung Cancer Screening Referral: no  Additional Screening:  Hepatitis C Screening: does qualify; Completed 03/26/2015  Vision Screening: Recommended  annual ophthalmology exams for early detection of glaucoma and other disorders of the eye. Is the patient up to date with their annual eye exam?  Yes  Who is the provider or what is the name of the office in which the patient attends annual eye exams? [facility] If pt is not established with a provider, would they like to be referred to a provider to establish care? No .   Dental Screening: Recommended annual dental exams for proper oral hygiene  Community Resource Referral / Chronic Care Management: CRR required this visit?  No   CCM required this visit?  No       Plan:     I have personally reviewed and noted the following in the patient's chart:   Medical and social history Use of alcohol, tobacco or illicit drugs  Current medications and supplements including opioid prescriptions. Patient is not currently taking opioid prescriptions. Functional ability and status Nutritional status Physical activity Advanced directives List of other physicians Hospitalizations, surgeries, and ER visits in previous 12 months Vitals Screenings to include cognitive, depression, and falls Referrals and appointments  In addition, I have reviewed and discussed with patient certain preventive protocols, quality metrics, and best practice recommendations. A written personalized care plan for preventive services as well as general preventive health recommendations were provided to patient.     Mickeal Needy, LPN   1/61/0960   Nurse Notes: Normal cognitive status assessed by direct observation via telephone conversation by this Nurse Health Advisor. No abnormalities found.

## 2022-10-30 DIAGNOSIS — H02831 Dermatochalasis of right upper eyelid: Secondary | ICD-10-CM | POA: Diagnosis not present

## 2022-10-30 DIAGNOSIS — H0102A Squamous blepharitis right eye, upper and lower eyelids: Secondary | ICD-10-CM | POA: Diagnosis not present

## 2022-10-30 DIAGNOSIS — H26491 Other secondary cataract, right eye: Secondary | ICD-10-CM | POA: Diagnosis not present

## 2022-10-30 DIAGNOSIS — H02834 Dermatochalasis of left upper eyelid: Secondary | ICD-10-CM | POA: Diagnosis not present

## 2022-10-30 DIAGNOSIS — H35352 Cystoid macular degeneration, left eye: Secondary | ICD-10-CM | POA: Diagnosis not present

## 2022-10-30 DIAGNOSIS — Z961 Presence of intraocular lens: Secondary | ICD-10-CM | POA: Diagnosis not present

## 2022-10-30 DIAGNOSIS — H0102B Squamous blepharitis left eye, upper and lower eyelids: Secondary | ICD-10-CM | POA: Diagnosis not present

## 2022-10-30 DIAGNOSIS — H35373 Puckering of macula, bilateral: Secondary | ICD-10-CM | POA: Diagnosis not present

## 2022-10-30 DIAGNOSIS — H401131 Primary open-angle glaucoma, bilateral, mild stage: Secondary | ICD-10-CM | POA: Diagnosis not present

## 2022-11-06 ENCOUNTER — Ambulatory Visit (INDEPENDENT_AMBULATORY_CARE_PROVIDER_SITE_OTHER): Payer: Medicare Other | Admitting: Internal Medicine

## 2022-11-06 VITALS — BP 138/76 | HR 60 | Temp 98.1°F | Ht 67.0 in | Wt 201.0 lb

## 2022-11-06 DIAGNOSIS — J4530 Mild persistent asthma, uncomplicated: Secondary | ICD-10-CM

## 2022-11-06 DIAGNOSIS — R739 Hyperglycemia, unspecified: Secondary | ICD-10-CM

## 2022-11-06 DIAGNOSIS — I10 Essential (primary) hypertension: Secondary | ICD-10-CM | POA: Diagnosis not present

## 2022-11-06 DIAGNOSIS — M25511 Pain in right shoulder: Secondary | ICD-10-CM | POA: Diagnosis not present

## 2022-11-06 DIAGNOSIS — N3281 Overactive bladder: Secondary | ICD-10-CM | POA: Insufficient documentation

## 2022-11-06 DIAGNOSIS — F411 Generalized anxiety disorder: Secondary | ICD-10-CM

## 2022-11-06 DIAGNOSIS — F410 Panic disorder [episodic paroxysmal anxiety] without agoraphobia: Secondary | ICD-10-CM | POA: Diagnosis not present

## 2022-11-06 DIAGNOSIS — E559 Vitamin D deficiency, unspecified: Secondary | ICD-10-CM | POA: Diagnosis not present

## 2022-11-06 MED ORDER — MELOXICAM 15 MG PO TABS: 15.0000 mg | ORAL_TABLET | Freq: Every day | ORAL | 1 refills | Status: DC | PRN

## 2022-11-06 MED ORDER — SOLIFENACIN SUCCINATE 5 MG PO TABS
5.0000 mg | ORAL_TABLET | Freq: Every day | ORAL | 3 refills | Status: DC
Start: 2022-11-06 — End: 2023-10-31

## 2022-11-06 NOTE — Assessment & Plan Note (Signed)
Overall stable, cont inhaler prn 

## 2022-11-06 NOTE — Assessment & Plan Note (Signed)
Improved but gemtesa too expensive, for change to vesicare 5 qd

## 2022-11-06 NOTE — Progress Notes (Signed)
Patient ID: [name] [name], male   DOB: [birth year], 72 y.o.   MRN: [id number]        Chief Complaint: follow up right shoulder pain; asthma, anxiety, hyperglycemia, low vit d       HPI:  [name] [name] is a 72 y.o. male here with c/o persistent right shoulder pain after seen at [facility] then recent orthopedic and MRI done may 17 after which it was determined he did not have surgical rot cuff issue, but did have tendonitis and asked to return for cortisone if needed, but though he recalls this he felt confused about all of this.  Pt denies chest pain, increased sob or doe, wheezing, orthopnea, PND, increased LE swelling, palpitations, dizziness or syncope.   Pt denies polydipsia, polyuria, or new focal neuro s/s.  Denies urinary symptoms such as dysuria, frequency, urgency, flank pain, hematuria or n/v, fever, chills - gemtesa works ok but $270 per month and hard to afford        Wt Readings from Last 3 Encounters:  11/06/22 201 lb (91.2 kg)  10/04/22 201 lb (91.2 kg)  10/03/22 195 lb (88.5 kg)   BP Readings from Last 3 Encounters:  11/06/22 138/76  10/03/22 (!) 168/94  09/25/22 134/78         Past Medical History:  Diagnosis Date   BENIGN PROSTATIC HYPERTROPHY 05/06/2010   COMMON MIGRAINE 07/31/2007   GERD 07/31/2007   HYPERLIPIDEMIA 01/04/2007   HYPERTENSION 01/04/2007   OVERACTIVE BLADDER 03/06/2007   Prostate cancer (HCC) 02/04/2015   Past Surgical History:  Procedure Laterality Date   BIOPSY  12/11/2021   Procedure: BIOPSY;  Surgeon: Lynann Bologna, MD;  Location: [facility] ENDOSCOPY;  Service: Gastroenterology;;   COLONOSCOPY  2009   ESOPHAGOGASTRODUODENOSCOPY (EGD) WITH PROPOFOL N/A 12/11/2021   Procedure: ESOPHAGOGASTRODUODENOSCOPY (EGD) WITH PROPOFOL;  Surgeon: Lynann Bologna, MD;  Location: [facility] ENDOSCOPY;  Service: Gastroenterology;  Laterality: N/A;    reports that he has never smoked. He has never used smokeless tobacco. He reports that he does not drink alcohol and does not use  drugs. family history includes Heart disease (age of onset: 8) in his brother; Lung cancer (age of onset: 19) in his father. Allergies  Allergen Reactions   Beta Adrenergic Blockers Other (See Comments)    bradycardia   Celebrex [Celecoxib] Other (See Comments)    Stroke like symptoms   Crestor [Rosuvastatin] Other (See Comments)    weakness   Detrol [Tolterodine] Other (See Comments)    headache   Ditropan [Oxybutynin] Other (See Comments)    Dry mouth   Hydralazine Other (See Comments)    Pt seems abnormally sensitive to med.  Got just 50mg  PO, dropped SBP from 180 to 73 with paradoxical sinus bradycardia in low 40s. If going to try to use in future, consider dramatically reduced dose.   Norvasc [Amlodipine Besylate] Swelling   Rapaflo [Silodosin] Other (See Comments)    Lwo blood pressures   Tadalafil Other (See Comments)    headache   Tamsulosin Other (See Comments)    Low blood pressure drops with taking   Toprol Xl [Metoprolol] Other (See Comments)    dizzy   Current Outpatient Medications on File Prior to Visit  Medication Sig Dispense Refill   aspirin 81 MG tablet Take 1 tablet (81 mg total) by mouth daily.     atorvastatin (LIPITOR) 40 MG tablet Take 1 tablet (40 mg total) by mouth daily. 90 tablet 3   benazepril (LOTENSIN) 40 MG tablet  Take 40 mg by mouth daily.     Cholecalciferol 50 MCG (2000 UT) TABS 1 tab by mouth once daily (Patient taking differently: Take 2,000 Units by mouth daily.) 30 tablet 99   diltiazem (CARDIZEM CD) 120 MG 24 hr capsule Take 1 capsule (120 mg total) by mouth daily. 90 capsule 3   dorzolamide-timolol (COSOPT) 22.3-6.8 MG/ML ophthalmic solution Place 1 drop into both eyes 2 (two) times daily.     hydrochlorothiazide (HYDRODIURIL) 25 MG tablet Take 25 mg by mouth daily.     ketorolac (ACULAR) 0.5 % ophthalmic solution Place 1 drop into the left eye 4 (four) times daily.     latanoprost (XALATAN) 0.005 % ophthalmic solution Place 1 drop into  both eyes at bedtime.  1   pantoprazole (PROTONIX) 40 MG tablet Take 1 tablet (40 mg total) by mouth daily. 90 tablet 3   polyethylene glycol (MIRALAX / GLYCOLAX) packet Take 17 g by mouth as needed for mild constipation.     potassium chloride (KLOR-CON) 10 MEQ tablet TAKE 1 TABLET BY MOUTH EVERY DAY 90 tablet 0   sildenafil (REVATIO) 20 MG tablet TAKE THREE TABLETS BY MOUTH AS NEEDED (Patient taking differently: Take 60 mg by mouth as needed (erectile dysfunction).) 90 tablet 11   spironolactone (ALDACTONE) 25 MG tablet Take 0.5 tablets (12.5 mg total) by mouth daily. 45 tablet 3   SYSTANE ULTRA 0.4-0.3 % SOLN Place 1 drop into both eyes daily as needed (dryness).     No current facility-administered medications on file prior to visit.        ROS:  All others reviewed and negative.  Objective        PE:  BP 138/76 (BP Location: Left Arm, Patient Position: Sitting, Cuff Size: Normal)   Pulse 60   Temp 98.1 F (36.7 C) (Oral)   Ht 5\' 7"  (1.702 m)   Wt 201 lb (91.2 kg)   SpO2 98%   BMI 31.48 kg/m                 Constitutional: Pt appears in NAD               HENT: Head: NCAT.                Right Ear: External ear normal.                 Left Ear: External ear normal.                Eyes: . Pupils are equal, round, and reactive to light. Conjunctivae and EOM are normal               Nose: without d/c or deformity               Neck: Neck supple. Gross normal ROM               Cardiovascular: Normal rate and regular rhythm.                 Pulmonary/Chest: Effort normal and breath sounds without rales or wheezing.                Abd:  Soft, NT, ND, + BS, no organomegaly               Neurological: Pt is alert. At baseline orientation, motor grossly intact               Skin: Skin is warm. No rashes,  no other new lesions, LE edema - none               Psychiatric: Pt behavior is normal without agitation , mild nervous  Micro: none  Cardiac tracings I have personally interpreted  today:  none  Pertinent Radiological findings (summarize): none   Lab Results  Component Value Date   WBC 6.4 06/08/2022   HGB 13.1 06/08/2022   HCT 40.6 06/08/2022   PLT 299 06/08/2022   GLUCOSE 93 06/08/2022   CHOL 151 10/17/2021   TRIG 66.0 10/17/2021   HDL 57.40 10/17/2021   LDLDIRECT 139.2 10/29/2012   LDLCALC 80 10/17/2021   ALT 18 12/10/2021   AST 21 12/10/2021   NA 143 06/08/2022   K 3.7 06/08/2022   CL 102 06/08/2022   CREATININE 1.15 06/08/2022   BUN 13 06/08/2022   CO2 26 06/08/2022   TSH 1.290 06/08/2022   PSA 0.22 07/20/2021   INR 1.0 12/08/2021   HGBA1C 6.0 10/17/2021   Assessment/Plan:  TAURIS FAHRENKRUG is a 72 y.o. Black or African American [2] male with  has a past medical history of BENIGN PROSTATIC HYPERTROPHY (05/06/2010), COMMON MIGRAINE (07/31/2007), GERD (07/31/2007), HYPERLIPIDEMIA (01/04/2007), HYPERTENSION (01/04/2007), OVERACTIVE BLADDER (03/06/2007), and Prostate cancer (HCC) (02/04/2015).  Asthma Overall stable, cont inhaler prn  Generalized anxiety disorder with panic attacks Mild situational, d/w pt the MRI results, reassured  Hyperglycemia Lab Results  Component Value Date   HGBA1C 6.0 10/17/2021   Stable, pt to continue current medical treatment  - diet, wt control   Vitamin D deficiency Last vitamin D Lab Results  Component Value Date   VD25OH 45.37 10/17/2021   Stable, cont oral replacement   Right shoulder pain I d/w pt recent MRI results and re-iterated results c/w tendonitis, and with persistent mild pain, will tx with mobic prn, but to return to Dr Margaretha Sheffield for cortisone if not improved, consider PT but declines for now  Primary hypertension BP Readings from Last 3 Encounters:  11/06/22 138/76  10/03/22 (!) 168/94  09/25/22 134/78   Stable, pt to continue medical treatment lotensin 40 every day, card CD 120 every day, hct 25 qd   OAB (overactive bladder) Improved but gemtesa too expensive, for change to vesicare 5  qd Followup: Return if symptoms worsen or fail to improve.  Oliver Barre, MD 11/06/2022 8:39 PM [facility]

## 2022-11-06 NOTE — Assessment & Plan Note (Signed)
BP Readings from Last 3 Encounters:  11/06/22 138/76  10/03/22 (!) 168/94  09/25/22 134/78   Stable, pt to continue medical treatment lotensin 40 every day, card CD 120 every day, hct 25 qd

## 2022-11-06 NOTE — Assessment & Plan Note (Signed)
I d/w pt recent MRI results and re-iterated results c/w tendonitis, and with persistent mild pain, will tx with mobic prn, but to return to Dr Margaretha Sheffield for cortisone if not improved, consider PT but declines for now

## 2022-11-06 NOTE — Assessment & Plan Note (Signed)
Mild situational, d/w pt the MRI results, reassured

## 2022-11-06 NOTE — Patient Instructions (Addendum)
Ok to change the [place] to vesicare 5 mg per day  Please take all new medication as prescribed - the mobic as needed for pain  Please consider seeing Sports Medicine or Dr Margaretha Sheffield for cortisone shot if needed  Please continue all other medications as before, and refills have been done if requested.  Please have the pharmacy call with any other refills you may need.  Please keep your appointments with your specialists as you may have planned

## 2022-11-06 NOTE — Assessment & Plan Note (Signed)
Lab Results  Component Value Date   HGBA1C 6.0 10/17/2021   Stable, pt to continue current medical treatment  - diet, wt control

## 2022-11-06 NOTE — Assessment & Plan Note (Signed)
Last vitamin D Lab Results  Component Value Date   VD25OH 45.37 10/17/2021   Stable, cont oral replacement  

## 2022-11-07 ENCOUNTER — Other Ambulatory Visit: Payer: Self-pay | Admitting: Internal Medicine

## 2022-11-07 ENCOUNTER — Other Ambulatory Visit: Payer: Self-pay

## 2022-11-19 ENCOUNTER — Other Ambulatory Visit: Payer: Self-pay | Admitting: Internal Medicine

## 2022-11-25 ENCOUNTER — Other Ambulatory Visit: Payer: Self-pay | Admitting: Internal Medicine

## 2022-11-27 ENCOUNTER — Other Ambulatory Visit: Payer: Self-pay

## 2022-11-29 ENCOUNTER — Encounter (HOSPITAL_BASED_OUTPATIENT_CLINIC_OR_DEPARTMENT_OTHER): Payer: Self-pay

## 2022-11-29 NOTE — Telephone Encounter (Signed)
Call to patient to discuss recent PM blood pressures.  Explained to patient his blood pressures were within normal limits. Pt reports spending a lot of time outdoors. Encouraged patient to be sure he is drinking plenty of fluids in the heat.  Perspiration maybe contributing to his blood pressures being a little lower in the PM.  Pt verbalized understanding of instructions as provided. Jim Like MHA RN CCM

## 2023-02-05 DIAGNOSIS — Z23 Encounter for immunization: Secondary | ICD-10-CM | POA: Diagnosis not present

## 2023-02-06 ENCOUNTER — Other Ambulatory Visit: Payer: Self-pay

## 2023-02-06 ENCOUNTER — Other Ambulatory Visit: Payer: Self-pay | Admitting: Internal Medicine

## 2023-02-06 MED ORDER — DORZOLAMIDE HCL-TIMOLOL MAL 2-0.5 % OP SOLN: 1.0000 [drp] | Freq: Two times a day (BID) | OPHTHALMIC | 0 refills | Status: DC

## 2023-02-22 ENCOUNTER — Other Ambulatory Visit: Payer: Self-pay

## 2023-02-22 ENCOUNTER — Other Ambulatory Visit: Payer: Self-pay | Admitting: Internal Medicine

## 2023-02-27 ENCOUNTER — Ambulatory Visit (INDEPENDENT_AMBULATORY_CARE_PROVIDER_SITE_OTHER): Payer: Medicare Other

## 2023-02-27 ENCOUNTER — Encounter: Payer: Self-pay | Admitting: Internal Medicine

## 2023-02-27 ENCOUNTER — Ambulatory Visit (INDEPENDENT_AMBULATORY_CARE_PROVIDER_SITE_OTHER): Payer: Medicare Other | Admitting: Internal Medicine

## 2023-02-27 VITALS — BP 114/68 | HR 55 | Temp 98.5°F | Ht 67.0 in | Wt 201.0 lb

## 2023-02-27 DIAGNOSIS — G8929 Other chronic pain: Secondary | ICD-10-CM

## 2023-02-27 DIAGNOSIS — E559 Vitamin D deficiency, unspecified: Secondary | ICD-10-CM | POA: Diagnosis not present

## 2023-02-27 DIAGNOSIS — M5441 Lumbago with sciatica, right side: Secondary | ICD-10-CM

## 2023-02-27 DIAGNOSIS — M5442 Lumbago with sciatica, left side: Secondary | ICD-10-CM

## 2023-02-27 DIAGNOSIS — I10 Essential (primary) hypertension: Secondary | ICD-10-CM

## 2023-02-27 DIAGNOSIS — N529 Male erectile dysfunction, unspecified: Secondary | ICD-10-CM

## 2023-02-27 DIAGNOSIS — M47816 Spondylosis without myelopathy or radiculopathy, lumbar region: Secondary | ICD-10-CM | POA: Diagnosis not present

## 2023-02-27 DIAGNOSIS — M47817 Spondylosis without myelopathy or radiculopathy, lumbosacral region: Secondary | ICD-10-CM | POA: Diagnosis not present

## 2023-02-27 DIAGNOSIS — M25511 Pain in right shoulder: Secondary | ICD-10-CM | POA: Diagnosis not present

## 2023-02-27 DIAGNOSIS — M5137 Other intervertebral disc degeneration, lumbosacral region with discogenic back pain only: Secondary | ICD-10-CM | POA: Diagnosis not present

## 2023-02-27 LAB — CBC WITH DIFFERENTIAL/PLATELET
Basophils Absolute: 0 10*3/uL (ref 0.0–0.1)
Basophils Relative: 0.2 % (ref 0.0–3.0)
Eosinophils Absolute: 0.1 10*3/uL (ref 0.0–0.7)
Eosinophils Relative: 0.8 % (ref 0.0–5.0)
HCT: 44.4 % (ref 39.0–52.0)
Hemoglobin: 14.6 g/dL (ref 13.0–17.0)
Lymphocytes Relative: 15.8 % (ref 12.0–46.0)
Lymphs Abs: 1.5 10*3/uL (ref 0.7–4.0)
MCHC: 32.8 g/dL (ref 30.0–36.0)
MCV: 95.5 fL (ref 78.0–100.0)
Monocytes Absolute: 1.1 10*3/uL — ABNORMAL HIGH (ref 0.1–1.0)
Monocytes Relative: 11 % (ref 3.0–12.0)
Neutro Abs: 6.9 10*3/uL (ref 1.4–7.7)
Neutrophils Relative %: 72.2 % (ref 43.0–77.0)
Platelets: 283 10*3/uL (ref 150.0–400.0)
RBC: 4.66 Mil/uL (ref 4.22–5.81)
RDW: 14 % (ref 11.5–15.5)
WBC: 9.6 10*3/uL (ref 4.0–10.5)

## 2023-02-27 LAB — BASIC METABOLIC PANEL
BUN: 15 mg/dL (ref 6–23)
CO2: 28 meq/L (ref 19–32)
Calcium: 9.6 mg/dL (ref 8.4–10.5)
Chloride: 101 meq/L (ref 96–112)
Creatinine, Ser: 1.35 mg/dL (ref 0.40–1.50)
GFR: 51.62 mL/min — ABNORMAL LOW (ref >60.00)
Glucose, Bld: 89 mg/dL (ref 70–99)
Potassium: 4.1 meq/L (ref 3.5–5.1)
Sodium: 135 meq/L (ref 135–145)

## 2023-02-27 LAB — URINALYSIS, ROUTINE W REFLEX MICROSCOPIC
Bilirubin Urine: NEGATIVE
Ketones, ur: NEGATIVE
Leukocytes,Ua: NEGATIVE
Nitrite: NEGATIVE
Specific Gravity, Urine: 1.01 (ref 1.000–1.030)
Total Protein, Urine: NEGATIVE
Urine Glucose: NEGATIVE
Urobilinogen, UA: 0.2 (ref 0.0–1.0)
pH: 7 (ref 5.0–8.0)

## 2023-02-27 LAB — HEPATIC FUNCTION PANEL
ALT: 19 U/L (ref 0–53)
AST: 21 U/L (ref 0–37)
Albumin: 4.4 g/dL (ref 3.5–5.2)
Alkaline Phosphatase: 64 U/L (ref 39–117)
Bilirubin, Direct: 0.1 mg/dL (ref 0.0–0.3)
Total Bilirubin: 0.6 mg/dL (ref 0.2–1.2)
Total Protein: 8.1 g/dL (ref 6.0–8.3)

## 2023-02-27 LAB — LIPASE: Lipase: 21 U/L (ref 11.0–59.0)

## 2023-02-27 MED ORDER — TRAMADOL HCL 50 MG PO TABS: 50.0000 mg | ORAL_TABLET | Freq: Four times a day (QID) | ORAL | 0 refills | Status: DC | PRN

## 2023-02-27 MED ORDER — SILDENAFIL CITRATE 100 MG PO TABS: 50.0000 mg | ORAL_TABLET | Freq: Every day | ORAL | 11 refills | Status: DC | PRN

## 2023-02-27 NOTE — Patient Instructions (Addendum)
Please take all new medication as prescribed - the stronger pain medication as needed  Please continue all other medications as before, and refills have been done if requested  - viagra  Please have the pharmacy call with any other refills you may need.  Please keep your appointments with your specialists as you may have planned  You will be contacted regarding the referral for: orthopedic for the right shoulder - Dr Ranell Patrick - [facility]  Please go to the XRAY Department in the first floor for the x-ray testing of the lower back  Please go to the LAB at the blood drawing area for the tests to be done  You will be contacted by phone if any changes need to be made immediately.  Otherwise, you will receive a letter about your results with an explanation, but please check with MyChart first.

## 2023-02-27 NOTE — Progress Notes (Signed)
The test results show that your current treatment is OK, as the tests are stable.  Please continue the same plan.  There is no other need for change of treatment or further evaluation based on these results, at this time.  thanks 

## 2023-02-27 NOTE — Progress Notes (Signed)
Patient ID: [name] [name], male   DOB: Feb 16, 1951, 72 y.o.   MRN: [id number]        Chief Complaint: follow up right shoulder pain, acute on chronic low back pain, ED worsening, htn, low vit d       HPI:  [name] [name] is a 72 y.o. male here with c/o 2 wks onset worsening right shoulder pain for unclear reason, mod to severe, reduced ROM.  Can no longer reach overhead well.  Also Pt c/o acute worsening LBP without  bowel or bladder change, fever, wt loss,  worsening LE pain/numbness/weakness, gait change or falls, but does have radiating pain around the waist to the bilateral lower quadrants.  Also with worsening several months Ed symptoms.Pt denies chest pain, increased sob or doe, wheezing, orthopnea, PND, increased LE swelling, palpitations, dizziness or syncope.   Pt denies polydipsia, polyuria, or new focal neuro s/s.   .       Wt Readings from Last 3 Encounters:  02/27/23 201 lb (91.2 kg)  11/06/22 201 lb (91.2 kg)  10/04/22 201 lb (91.2 kg)   BP Readings from Last 3 Encounters:  02/27/23 114/68  11/06/22 138/76  10/03/22 (!) 168/94         Past Medical History:  Diagnosis Date   BENIGN PROSTATIC HYPERTROPHY 05/06/2010   COMMON MIGRAINE 07/31/2007   GERD 07/31/2007   HYPERLIPIDEMIA 01/04/2007   HYPERTENSION 01/04/2007   OVERACTIVE BLADDER 03/06/2007   Prostate cancer (HCC) 02/04/2015   Past Surgical History:  Procedure Laterality Date   BIOPSY  12/11/2021   Procedure: BIOPSY;  Surgeon: Lynann Bologna, MD;  Location: [facility] ENDOSCOPY;  Service: Gastroenterology;;   COLONOSCOPY  2009   ESOPHAGOGASTRODUODENOSCOPY (EGD) WITH PROPOFOL N/A 12/11/2021   Procedure: ESOPHAGOGASTRODUODENOSCOPY (EGD) WITH PROPOFOL;  Surgeon: Lynann Bologna, MD;  Location: [facility] ENDOSCOPY;  Service: Gastroenterology;  Laterality: N/A;    reports that he has never smoked. He has never used smokeless tobacco. He reports that he does not drink alcohol and does not use drugs. family history includes Heart disease  (age of onset: 71) in his brother; Lung cancer (age of onset: 59) in his father. Allergies  Allergen Reactions   Beta Adrenergic Blockers Other (See Comments)    bradycardia   Celebrex [Celecoxib] Other (See Comments)    Stroke like symptoms   Crestor [Rosuvastatin] Other (See Comments)    weakness   Detrol [Tolterodine] Other (See Comments)    headache   Ditropan [Oxybutynin] Other (See Comments)    Dry mouth   Hydralazine Other (See Comments)    Pt seems abnormally sensitive to med.  Got just 50mg  PO, dropped SBP from 180 to 73 with paradoxical sinus bradycardia in low 40s. If going to try to use in future, consider dramatically reduced dose.   Norvasc [Amlodipine Besylate] Swelling   Rapaflo [Silodosin] Other (See Comments)    Lwo blood pressures   Tadalafil Other (See Comments)    headache   Tamsulosin Other (See Comments)    Low blood pressure drops with taking   Toprol Xl [Metoprolol] Other (See Comments)    dizzy   Current Outpatient Medications on File Prior to Visit  Medication Sig Dispense Refill   aspirin 81 MG tablet Take 1 tablet (81 mg total) by mouth daily.     atorvastatin (LIPITOR) 40 MG tablet TAKE 1 TABLET BY MOUTH EVERY DAY 90 tablet 3   benazepril (LOTENSIN) 40 MG tablet TAKE 1 TABLET BY MOUTH EVERY DAY 90 tablet 3  Cholecalciferol 50 MCG (2000 UT) TABS 1 tab by mouth once daily (Patient taking differently: Take 2,000 Units by mouth daily.) 30 tablet 99   diltiazem (CARDIZEM CD) 120 MG 24 hr capsule Take 1 capsule (120 mg total) by mouth daily. 90 capsule 3   dorzolamide-timolol (COSOPT) 2-0.5 % ophthalmic solution Place 1 drop into both eyes 2 (two) times daily. 10 mL 0   hydrochlorothiazide (HYDRODIURIL) 25 MG tablet Take 25 mg by mouth daily.     ketorolac (ACULAR) 0.5 % ophthalmic solution Place 1 drop into the left eye 4 (four) times daily.     latanoprost (XALATAN) 0.005 % ophthalmic solution Place 1 drop into both eyes at bedtime.  1   meloxicam  (MOBIC) 15 MG tablet Take 1 tablet (15 mg total) by mouth daily as needed for pain. 90 tablet 1   pantoprazole (PROTONIX) 40 MG tablet Take 1 tablet (40 mg total) by mouth daily. 90 tablet 3   polyethylene glycol (MIRALAX / GLYCOLAX) packet Take 17 g by mouth as needed for mild constipation.     potassium chloride (KLOR-CON) 10 MEQ tablet TAKE 1 TABLET BY MOUTH EVERY DAY 90 tablet 3   sildenafil (REVATIO) 20 MG tablet TAKE THREE TABLETS BY MOUTH AS NEEDED (Patient taking differently: Take 60 mg by mouth as needed (erectile dysfunction).) 90 tablet 11   solifenacin (VESICARE) 5 MG tablet Take 1 tablet (5 mg total) by mouth daily. 90 tablet 3   SYSTANE ULTRA 0.4-0.3 % SOLN Place 1 drop into both eyes daily as needed (dryness).     spironolactone (ALDACTONE) 25 MG tablet Take 0.5 tablets (12.5 mg total) by mouth daily. 45 tablet 3   No current facility-administered medications on file prior to visit.        ROS:  All others reviewed and negative.  Objective        PE:  BP 114/68 (BP Location: Right Arm, Patient Position: Sitting, Cuff Size: Normal)   Pulse (!) 55   Temp 98.5 F (36.9 C) (Oral)   Ht 5\' 7"  (1.702 m)   Wt 201 lb (91.2 kg)   SpO2 99%   BMI 31.48 kg/m                 Constitutional: Pt appears in NAD               HENT: Head: NCAT.                Right Ear: External ear normal.                 Left Ear: External ear normal.                Eyes: . Pupils are equal, round, and reactive to light. Conjunctivae and EOM are normal               Nose: without d/c or deformity               Neck: Neck supple. Gross normal ROM               Cardiovascular: Normal rate and regular rhythm.                 Pulmonary/Chest: Effort normal and breath sounds without rales or wheezing.                Abd:  Soft, NT, ND, + BS, no organomegaly  Neurological: Pt is alert. At baseline orientation, motor grossly intact; right shoulder with anterior pain and reduced ROM; lumbar  spine without tender in the midline, has mild right lower lumbar paravertebral tender without rash or swelling               Skin: Skin is warm. No rashes, no other new lesions, LE edema - none               Psychiatric: Pt behavior is normal without agitation   Micro: none  Cardiac tracings I have personally interpreted today:  none  Pertinent Radiological findings (summarize): none   Lab Results  Component Value Date   WBC 9.6 02/27/2023   HGB 14.6 02/27/2023   HCT 44.4 02/27/2023   PLT 283.0 02/27/2023   GLUCOSE 89 02/27/2023   CHOL 151 10/17/2021   TRIG 66.0 10/17/2021   HDL 57.40 10/17/2021   LDLDIRECT 139.2 10/29/2012   LDLCALC 80 10/17/2021   ALT 19 02/27/2023   AST 21 02/27/2023   NA 135 02/27/2023   K 4.1 02/27/2023   CL 101 02/27/2023   CREATININE 1.35 02/27/2023   BUN 15 02/27/2023   CO2 28 02/27/2023   TSH 1.290 06/08/2022   PSA 0.22 07/20/2021   INR 1.0 12/08/2021   HGBA1C 6.0 10/17/2021   Assessment/Plan:  [name] [name] is a 72 y.o. Black or African American [2] male with  has a past medical history of BENIGN PROSTATIC HYPERTROPHY (05/06/2010), COMMON MIGRAINE (07/31/2007), GERD (07/31/2007), HYPERLIPIDEMIA (01/04/2007), HYPERTENSION (01/04/2007), OVERACTIVE BLADDER (03/06/2007), and Prostate cancer (HCC) (02/04/2015).  Vitamin D deficiency Last vitamin D Lab Results  Component Value Date   VD25OH 45.37 10/17/2021   Stable, cont oral replacement   Right shoulder pain C/w possible rotater cuff disorder - for referral ortho  Primary hypertension BP Readings from Last 3 Encounters:  02/27/23 114/68  11/06/22 138/76  10/03/22 (!) 168/94   Stable, pt to continue medical treatment lotensin 40 every day, card cd 120 every day, hct 25 qd   Low back pain With acute on chronic flare, for ls spine films, tramaodl prn, and abs as ordered  Erectile dysfunction Ok for retart viagra prn,  to f/u any worsening symptoms or concerns  Followup: Return in  about 6 months (around 08/28/2023).  Oliver Barre, MD 03/02/2023 8:52 PM [facility]

## 2023-03-02 ENCOUNTER — Encounter: Payer: Self-pay | Admitting: Internal Medicine

## 2023-03-02 NOTE — Assessment & Plan Note (Signed)
Ok for retart viagra prn,  to f/u any worsening symptoms or concerns

## 2023-03-02 NOTE — Assessment & Plan Note (Signed)
With acute on chronic flare, for ls spine films, tramaodl prn, and abs as ordered

## 2023-03-02 NOTE — Assessment & Plan Note (Signed)
Last vitamin D Lab Results  Component Value Date   VD25OH 45.37 10/17/2021   Stable, cont oral replacement

## 2023-03-02 NOTE — Assessment & Plan Note (Signed)
BP Readings from Last 3 Encounters:  02/27/23 114/68  11/06/22 138/76  10/03/22 (!) 168/94   Stable, pt to continue medical treatment lotensin 40 every day, card cd 120 every day, hct 25 qd

## 2023-03-02 NOTE — Assessment & Plan Note (Signed)
C/w possible rotater cuff disorder - for referral ortho

## 2023-03-06 DIAGNOSIS — H26491 Other secondary cataract, right eye: Secondary | ICD-10-CM | POA: Diagnosis not present

## 2023-03-06 DIAGNOSIS — H02834 Dermatochalasis of left upper eyelid: Secondary | ICD-10-CM | POA: Diagnosis not present

## 2023-03-06 DIAGNOSIS — H401131 Primary open-angle glaucoma, bilateral, mild stage: Secondary | ICD-10-CM | POA: Diagnosis not present

## 2023-03-06 DIAGNOSIS — H02831 Dermatochalasis of right upper eyelid: Secondary | ICD-10-CM | POA: Diagnosis not present

## 2023-03-06 DIAGNOSIS — Z961 Presence of intraocular lens: Secondary | ICD-10-CM | POA: Diagnosis not present

## 2023-03-06 DIAGNOSIS — H0102B Squamous blepharitis left eye, upper and lower eyelids: Secondary | ICD-10-CM | POA: Diagnosis not present

## 2023-03-06 DIAGNOSIS — H35352 Cystoid macular degeneration, left eye: Secondary | ICD-10-CM | POA: Diagnosis not present

## 2023-03-06 DIAGNOSIS — H0102A Squamous blepharitis right eye, upper and lower eyelids: Secondary | ICD-10-CM | POA: Diagnosis not present

## 2023-03-09 DIAGNOSIS — N401 Enlarged prostate with lower urinary tract symptoms: Secondary | ICD-10-CM | POA: Diagnosis not present

## 2023-03-13 DIAGNOSIS — M75101 Unspecified rotator cuff tear or rupture of right shoulder, not specified as traumatic: Secondary | ICD-10-CM | POA: Diagnosis not present

## 2023-03-16 DIAGNOSIS — C61 Malignant neoplasm of prostate: Secondary | ICD-10-CM | POA: Diagnosis not present

## 2023-03-16 DIAGNOSIS — R351 Nocturia: Secondary | ICD-10-CM | POA: Diagnosis not present

## 2023-03-26 ENCOUNTER — Other Ambulatory Visit: Payer: Self-pay | Admitting: Internal Medicine

## 2023-03-26 ENCOUNTER — Other Ambulatory Visit: Payer: Self-pay

## 2023-03-28 NOTE — Progress Notes (Signed)
Cardiology Office Note:  .   Date:  03/29/2023  ID:  [name] [name], DOB 07-Apr-1951, MRN [id number] PCP: Corwin Levins, MD   HeartCare Providers Cardiologist:  Jodelle Red, MD {  History of Present Illness: .   [name] [name] is a 72 y.o. male with a hx of SVT, hypertension, hyperlipidemia, GERD, BPH, and prostate cancer, who is seen for follow up today. I initially met him 05/17/22 as a new consult at the request of Corwin Levins, MD for the evaluation and management of tachycardia.    FH: brother died of MI in his 49s. Mother had CHF.   BP history: Losartan didn't help much.   Today: Discussed the use of AI scribe software for clinical note transcription with the patient, who gave verbal consent to proceed.  History of Present Illness   The patient, with a history of hypertension, hyperlipidemia, and palpitations, reports overall good health. He has been monitoring his blood pressure at home, with the highest reading being 130/82. He notes that his blood pressure tends to be slightly elevated during initial readings at the doctor's office but normalizes with subsequent measurements.  The patient is currently on benazepril, hydrochlorothiazide, spironolactone, and diltiazem. He reports that he once forgot to take his half pill of spironolactone, which resulted in a temporary increase in blood pressure. However, he promptly took the medication when he realized this, and his blood pressure normalized.  The patient has expressed concerns about the side effects of spironolactone, specifically erectile dysfunction. He reports that Viagra has not been particularly effective in managing this side effect. Despite this, he is willing to continue with the spironolactone due to its efficacy in managing his blood pressure.  The patient denies any chest pain, blackouts, or palpitations. He also reports no issues with fluid retention or swelling. He maintains an active lifestyle,  engaging in regular exercise and physical activities such as woodworking, without experiencing undue fatigue.  The patient's cholesterol levels were last checked over a year ago. He is unsure if his primary care physician plans to conduct another cholesterol test soon. He has not reported any issues with his current medications, apart from the spironolactone.       ROS: Denies chest pain, shortness of breath at rest or with normal exertion. No PND, orthopnea, LE edema or unexpected weight gain. No syncope or palpitations. ROS otherwise negative except as noted.   Studies Reviewed: Marland Kitchen    EKG:       Physical Exam:   VS:  BP 130/82   Pulse 64   Ht 5\' 7"  (1.702 m)   Wt 203 lb (92.1 kg)   SpO2 96%   BMI 31.79 kg/m    Wt Readings from Last 3 Encounters:  03/29/23 203 lb (92.1 kg)  02/27/23 201 lb (91.2 kg)  11/06/22 201 lb (91.2 kg)    GEN: Well nourished, well developed in no acute distress HEENT: Normal, moist mucous membranes NECK: No JVD CARDIAC: regular rhythm, normal S1 and S2, no rubs or gallops. No murmur. VASCULAR: Radial and DP pulses 2+ bilaterally. No carotid bruits RESPIRATORY:  Clear to auscultation without rales, wheezing or rhonchi  ABDOMEN: Soft, non-tender, non-distended MUSCULOSKELETAL:  Ambulates independently SKIN: Warm and dry, no edema NEUROLOGIC:  Alert and oriented x 3. No focal neuro deficits noted. PSYCHIATRIC:  Normal affect    ASSESSMENT AND PLAN: .    Intermittent SVT, palpitations:  -well controlled currently -continue diltiazem -prior echo without significant abnormalities -prior  MPI 2015, but no chest pain/anginal symptoms   Hypertension:  -has been well controlled on benazepril, hydrochlorothiazide and spironolactone -checks home BP -does have erectile dysfunction. Discussed alternative to spironolactone. Would avoid beta blockers given ED as well. He does not feel that viagra is helping him much. After shared decision making, he will continue  spironolactone for now. If ED gets worse he will let me know. Also recommended discussing with his urologist if there is an alternative to viagra.   Hypercholesterolemia: elevated ASCVD risk -LDL 80 10/2021, at goal (<100). If not checked prior, recheck at next visit -continue atorvastatin 20 mg daily, aspirin 81 mg daily (this is optional, but with hyperglycemia/nearing diabetes, reasonable to continue as he reports no bleeding issues)  CV risk counseling and prevention -recommend heart healthy/Mediterranean diet, with whole grains, fruits, vegetable, fish, lean meats, nuts, and olive oil. Limit salt. -recommend moderate walking, 3-5 times/week for 30-50 minutes each session. Aim for at least 150 minutes.week. Goal should be pace of 3 miles/hours, or walking 1.5 miles in 30 minutes -recommend avoidance of tobacco products. Avoid excess alcohol. -ASCVD risk score: The 10-year ASCVD risk score (Arnett DK, et al., 2019) is: 19.7%   Values used to calculate the score:     Age: 56 years     Sex: Male     Is Non-Hispanic African American: Yes     Diabetic: No     Tobacco smoker: No     Systolic Blood Pressure: 130 mmHg     Is BP treated: Yes     HDL Cholesterol: 57.4 mg/dL     Total Cholesterol: 151 mg/dL    Dispo: 6 mos or sooner as needed  Signed, Jodelle Red, MD   Jodelle Red, MD, PhD, [facility]  [facility]  [place] V[facility] at [facility] [place] at [facility] [address] [place], [place] [address] [phone]

## 2023-03-29 ENCOUNTER — Encounter (HOSPITAL_BASED_OUTPATIENT_CLINIC_OR_DEPARTMENT_OTHER): Payer: Self-pay | Admitting: Cardiology

## 2023-03-29 ENCOUNTER — Ambulatory Visit (INDEPENDENT_AMBULATORY_CARE_PROVIDER_SITE_OTHER): Payer: Medicare Other | Admitting: Cardiology

## 2023-03-29 VITALS — BP 130/82 | HR 64 | Ht 67.0 in | Wt 203.0 lb

## 2023-03-29 DIAGNOSIS — N529 Male erectile dysfunction, unspecified: Secondary | ICD-10-CM | POA: Diagnosis not present

## 2023-03-29 DIAGNOSIS — E7800 Pure hypercholesterolemia, unspecified: Secondary | ICD-10-CM

## 2023-03-29 DIAGNOSIS — I10 Essential (primary) hypertension: Secondary | ICD-10-CM

## 2023-03-29 DIAGNOSIS — Z7189 Other specified counseling: Secondary | ICD-10-CM

## 2023-03-29 DIAGNOSIS — I471 Supraventricular tachycardia, unspecified: Secondary | ICD-10-CM

## 2023-03-29 DIAGNOSIS — M25511 Pain in right shoulder: Secondary | ICD-10-CM | POA: Diagnosis not present

## 2023-03-29 MED ORDER — SPIRONOLACTONE 25 MG PO TABS: 12.5000 mg | ORAL_TABLET | Freq: Every day | ORAL | 3 refills | Status: DC

## 2023-03-29 NOTE — Patient Instructions (Signed)

## 2023-04-03 DIAGNOSIS — M25511 Pain in right shoulder: Secondary | ICD-10-CM | POA: Diagnosis not present

## 2023-04-06 ENCOUNTER — Encounter: Payer: Self-pay | Admitting: Internal Medicine

## 2023-04-09 MED ORDER — TADALAFIL 20 MG PO TABS: 20.0000 mg | ORAL_TABLET | Freq: Every day | ORAL | 11 refills | Status: DC | PRN

## 2023-04-10 DIAGNOSIS — M25511 Pain in right shoulder: Secondary | ICD-10-CM | POA: Diagnosis not present

## 2023-04-12 DIAGNOSIS — M25511 Pain in right shoulder: Secondary | ICD-10-CM | POA: Diagnosis not present

## 2023-04-17 DIAGNOSIS — M25511 Pain in right shoulder: Secondary | ICD-10-CM | POA: Diagnosis not present

## 2023-04-19 DIAGNOSIS — M25511 Pain in right shoulder: Secondary | ICD-10-CM | POA: Diagnosis not present

## 2023-04-24 DIAGNOSIS — M25511 Pain in right shoulder: Secondary | ICD-10-CM | POA: Diagnosis not present

## 2023-04-26 DIAGNOSIS — M25511 Pain in right shoulder: Secondary | ICD-10-CM | POA: Diagnosis not present

## 2023-04-30 DIAGNOSIS — M25511 Pain in right shoulder: Secondary | ICD-10-CM | POA: Diagnosis not present

## 2023-05-02 ENCOUNTER — Other Ambulatory Visit: Payer: Self-pay | Admitting: Internal Medicine

## 2023-05-29 ENCOUNTER — Ambulatory Visit (INDEPENDENT_AMBULATORY_CARE_PROVIDER_SITE_OTHER): Payer: Medicare Other

## 2023-05-29 ENCOUNTER — Encounter: Payer: Self-pay | Admitting: Internal Medicine

## 2023-05-29 ENCOUNTER — Ambulatory Visit (INDEPENDENT_AMBULATORY_CARE_PROVIDER_SITE_OTHER): Payer: Medicare Other | Admitting: Internal Medicine

## 2023-05-29 VITALS — BP 154/90 | HR 55 | Temp 98.0°F | Ht 67.0 in | Wt 206.0 lb

## 2023-05-29 DIAGNOSIS — R079 Chest pain, unspecified: Secondary | ICD-10-CM | POA: Diagnosis not present

## 2023-05-29 DIAGNOSIS — E559 Vitamin D deficiency, unspecified: Secondary | ICD-10-CM | POA: Diagnosis not present

## 2023-05-29 DIAGNOSIS — E7800 Pure hypercholesterolemia, unspecified: Secondary | ICD-10-CM | POA: Diagnosis not present

## 2023-05-29 DIAGNOSIS — R0602 Shortness of breath: Secondary | ICD-10-CM | POA: Diagnosis not present

## 2023-05-29 DIAGNOSIS — R739 Hyperglycemia, unspecified: Secondary | ICD-10-CM

## 2023-05-29 DIAGNOSIS — I10 Essential (primary) hypertension: Secondary | ICD-10-CM

## 2023-05-29 DIAGNOSIS — R0989 Other specified symptoms and signs involving the circulatory and respiratory systems: Secondary | ICD-10-CM | POA: Diagnosis not present

## 2023-05-29 DIAGNOSIS — R059 Cough, unspecified: Secondary | ICD-10-CM | POA: Diagnosis not present

## 2023-05-29 LAB — URINALYSIS, ROUTINE W REFLEX MICROSCOPIC
Bilirubin Urine: NEGATIVE
Ketones, ur: NEGATIVE
Leukocytes,Ua: NEGATIVE
Nitrite: NEGATIVE
Specific Gravity, Urine: 1.015 (ref 1.000–1.030)
Total Protein, Urine: NEGATIVE
Urine Glucose: NEGATIVE
Urobilinogen, UA: 0.2 (ref 0.0–1.0)
pH: 6.5 (ref 5.0–8.0)

## 2023-05-29 LAB — CBC WITH DIFFERENTIAL/PLATELET
Basophils Absolute: 0 10*3/uL (ref 0.0–0.1)
Basophils Relative: 0.5 % (ref 0.0–3.0)
Eosinophils Absolute: 0 10*3/uL (ref 0.0–0.7)
Eosinophils Relative: 0.5 % (ref 0.0–5.0)
HCT: 45.4 % (ref 39.0–52.0)
Hemoglobin: 15.1 g/dL (ref 13.0–17.0)
Lymphocytes Relative: 21.2 % (ref 12.0–46.0)
Lymphs Abs: 1.4 10*3/uL (ref 0.7–4.0)
MCHC: 33.3 g/dL (ref 30.0–36.0)
MCV: 96 fL (ref 78.0–100.0)
Monocytes Absolute: 0.5 10*3/uL (ref 0.1–1.0)
Monocytes Relative: 7.3 % (ref 3.0–12.0)
Neutro Abs: 4.6 10*3/uL (ref 1.4–7.7)
Neutrophils Relative %: 70.5 % (ref 43.0–77.0)
Platelets: 284 10*3/uL (ref 150.0–400.0)
RBC: 4.73 Mil/uL (ref 4.22–5.81)
RDW: 13.8 % (ref 11.5–15.5)
WBC: 6.5 10*3/uL (ref 4.0–10.5)

## 2023-05-29 LAB — LIPID PANEL
Cholesterol: 229 mg/dL — ABNORMAL HIGH (ref 0–200)
HDL: 58.4 mg/dL (ref >39.00)
LDL Cholesterol: 153 mg/dL — ABNORMAL HIGH (ref 0–99)
NonHDL: 170.65
Total CHOL/HDL Ratio: 4
Triglycerides: 87 mg/dL (ref 0.0–149.0)
VLDL: 17.4 mg/dL (ref 0.0–40.0)

## 2023-05-29 LAB — BASIC METABOLIC PANEL
BUN: 13 mg/dL (ref 6–23)
CO2: 28 meq/L (ref 19–32)
Calcium: 9.7 mg/dL (ref 8.4–10.5)
Chloride: 103 meq/L (ref 96–112)
Creatinine, Ser: 1.19 mg/dL (ref 0.40–1.50)
GFR: 59.95 mL/min — ABNORMAL LOW (ref >60.00)
Glucose, Bld: 93 mg/dL (ref 70–99)
Potassium: 4.1 meq/L (ref 3.5–5.1)
Sodium: 139 meq/L (ref 135–145)

## 2023-05-29 LAB — TSH: TSH: 1.42 u[IU]/mL (ref 0.35–5.50)

## 2023-05-29 LAB — HEPATIC FUNCTION PANEL
ALT: 16 U/L (ref 0–53)
AST: 21 U/L (ref 0–37)
Albumin: 4.7 g/dL (ref 3.5–5.2)
Alkaline Phosphatase: 56 U/L (ref 39–117)
Bilirubin, Direct: 0.2 mg/dL (ref 0.0–0.3)
Total Bilirubin: 0.8 mg/dL (ref 0.2–1.2)
Total Protein: 8.2 g/dL (ref 6.0–8.3)

## 2023-05-29 LAB — TROPONIN I (HIGH SENSITIVITY): High Sens Troponin I: 8 ng/L (ref 2–17)

## 2023-05-29 LAB — D-DIMER, QUANTITATIVE: D-Dimer, Quant: 0.43 ug{FEU}/mL (ref <0.50)

## 2023-05-29 LAB — HEMOGLOBIN A1C: Hgb A1c MFr Bld: 6.2 % (ref 4.6–6.5)

## 2023-05-29 NOTE — Progress Notes (Signed)
Patient ID: [name] [name], male   DOB: 1950-08-07, 73 y.o.   MRN: [id number]         Chief Complaint:: yearly exam and Medical Management of Chronic Issues (Discomfort on left side started 3 days ago feels it constantly )  Sharp left lateral chest pain,, worse to bend at the waist, with very mild sob but no wheezing, overall mild but different so felt he needed to see about it       HPI:  [name] [name] is a 73 y.o. male here with above, with pain being nonexertional, non pleuritic  non positional, Pt denies wheezing, orthopnea, PND, increased LE swelling, palpitations, dizziness or syncope.   Pt denies polydipsia, polyuria, or new focal neuro s/s.  Pt denies fever, wt loss, night sweats, loss of appetite, or other constitutional symptoms  Pt states BP at home has been normal Wt Readings from Last 3 Encounters:  05/29/23 206 lb (93.4 kg)  03/29/23 203 lb (92.1 kg)  02/27/23 201 lb (91.2 kg)   BP Readings from Last 3 Encounters:  05/29/23 (!) 154/90  03/29/23 130/82  02/27/23 114/68   Immunization History  Administered Date(s) Administered   Fluad Quad(high Dose 65+) 01/14/2019, 01/10/2022   Fluad Trivalent(High Dose 65+) 02/05/2023   Influenza Split 03/15/2012   Influenza Whole 03/08/2006, 03/09/2008, 02/05/2009   Influenza, High Dose Seasonal PF 02/07/2018, 02/17/2021   Influenza,inj,Quad PF,6+ Mos 02/03/2013, 02/27/2014, 02/23/2015, 02/24/2016, 02/06/2017   Influenza-Unspecified 02/07/2018   PFIZER(Purple Top)SARS-COV-2 Vaccination 05/20/2019, 06/09/2019   Pfizer Covid-19 Vaccine Bivalent Booster 59yrs & up 02/24/2021, 02/24/2021   Pneumococcal Conjugate-13 07/15/2013, 04/08/2014   Pneumococcal Polysaccharide-23 09/23/2015   Respiratory Syncytial Virus Vaccine,Recomb Aduvanted(Arexvy) 02/26/2023   Td 04/20/2008   Tdap 06/12/2018   Zoster Recombinant(Shingrix) 05/15/2023   Zoster, Live 05/06/2010  There are no preventive care reminders to display for this patient.    Past  Medical History:  Diagnosis Date   BENIGN PROSTATIC HYPERTROPHY 05/06/2010   COMMON MIGRAINE 07/31/2007   GERD 07/31/2007   HYPERLIPIDEMIA 01/04/2007   HYPERTENSION 01/04/2007   OVERACTIVE BLADDER 03/06/2007   Prostate cancer (HCC) 02/04/2015   Past Surgical History:  Procedure Laterality Date   BIOPSY  12/11/2021   Procedure: BIOPSY;  Surgeon: Lynann Bologna, MD;  Location: [facility] ENDOSCOPY;  Service: Gastroenterology;;   COLONOSCOPY  2009   ESOPHAGOGASTRODUODENOSCOPY (EGD) WITH PROPOFOL N/A 12/11/2021   Procedure: ESOPHAGOGASTRODUODENOSCOPY (EGD) WITH PROPOFOL;  Surgeon: Lynann Bologna, MD;  Location: [facility] ENDOSCOPY;  Service: Gastroenterology;  Laterality: N/A;    reports that he has never smoked. He has never used smokeless tobacco. He reports that he does not drink alcohol and does not use drugs. family history includes Heart disease (age of onset: 31) in his brother; Lung cancer (age of onset: 81) in his father. Allergies  Allergen Reactions   Beta Adrenergic Blockers Other (See Comments)    bradycardia   Celebrex [Celecoxib] Other (See Comments)    Stroke like symptoms   Crestor [Rosuvastatin] Other (See Comments)    weakness   Detrol [Tolterodine] Other (See Comments)    headache   Ditropan [Oxybutynin] Other (See Comments)    Dry mouth   Hydralazine Other (See Comments)    Pt seems abnormally sensitive to med.  Got just 50mg  PO, dropped SBP from 180 to 73 with paradoxical sinus bradycardia in low 40s. If going to try to use in future, consider dramatically reduced dose.   Norvasc [Amlodipine Besylate] Swelling   Rapaflo [Silodosin] Other (See Comments)  Lwo blood pressures   Tadalafil Other (See Comments)    headache   Tamsulosin Other (See Comments)    Low blood pressure drops with taking   Toprol Xl [Metoprolol] Other (See Comments)    dizzy   Current Outpatient Medications on File Prior to Visit  Medication Sig Dispense Refill   aspirin 81 MG tablet Take 1 tablet (81  mg total) by mouth daily.     atorvastatin (LIPITOR) 40 MG tablet TAKE 1 TABLET BY MOUTH EVERY DAY 90 tablet 3   benazepril (LOTENSIN) 40 MG tablet TAKE 1 TABLET BY MOUTH EVERY DAY 90 tablet 3   Cholecalciferol 50 MCG (2000 UT) TABS 1 tab by mouth once daily (Patient taking differently: Take 2,000 Units by mouth daily.) 30 tablet 99   diltiazem (CARDIZEM CD) 120 MG 24 hr capsule Take 1 capsule (120 mg total) by mouth daily. 90 capsule 3   dorzolamide-timolol (COSOPT) 2-0.5 % ophthalmic solution INSTILL 1 DROP INTO BOTH EYES TWICE A DAY 10 mL 0   hydrochlorothiazide (HYDRODIURIL) 25 MG tablet Take 25 mg by mouth daily.     ketorolac (ACULAR) 0.5 % ophthalmic solution Place 1 drop into the left eye 4 (four) times daily.     latanoprost (XALATAN) 0.005 % ophthalmic solution Place 1 drop into both eyes at bedtime.  1   meloxicam (MOBIC) 15 MG tablet TAKE 1 TABLET BY MOUTH EVERY DAY AS NEEDED FOR PAIN 90 tablet 1   pantoprazole (PROTONIX) 40 MG tablet Take 1 tablet (40 mg total) by mouth daily. 90 tablet 3   polyethylene glycol (MIRALAX / GLYCOLAX) packet Take 17 g by mouth as needed for mild constipation.     potassium chloride (KLOR-CON) 10 MEQ tablet TAKE 1 TABLET BY MOUTH EVERY DAY 90 tablet 3   solifenacin (VESICARE) 5 MG tablet Take 1 tablet (5 mg total) by mouth daily. 90 tablet 3   spironolactone (ALDACTONE) 25 MG tablet Take 0.5 tablets (12.5 mg total) by mouth daily. 45 tablet 3   SYSTANE ULTRA 0.4-0.3 % SOLN Place 1 drop into both eyes daily as needed (dryness).     tadalafil (CIALIS) 20 MG tablet Take 1 tablet (20 mg total) by mouth daily as needed for erectile dysfunction. 10 tablet 11   No current facility-administered medications on file prior to visit.        ROS:  All others reviewed and negative.  Objective        PE:  BP (!) 154/90 (BP Location: Right Arm, Patient Position: Sitting, Cuff Size: Normal)   Pulse (!) 55   Temp 98 F (36.7 C) (Oral)   Ht 5\' 7"  (1.702 m)   Wt 206  lb (93.4 kg)   SpO2 98%   BMI 32.26 kg/m                 Constitutional: Pt appears in NAD               HENT: Head: NCAT.                Right Ear: External ear normal.                 Left Ear: External ear normal.                Eyes: . Pupils are equal, round, and reactive to light. Conjunctivae and EOM are normal               Nose: without d/c  or deformity               Neck: Neck supple. Gross normal ROM               Cardiovascular: Normal rate and regular rhythm.                 Pulmonary/Chest: Effort normal and breath sounds without rales or wheezing.                Abd:  Soft, NT, ND, + BS, no organomegaly               Neurological: Pt is alert. At baseline orientation, motor grossly intact               Skin: Skin is warm. No rashes, no other new lesions, LE edema - none               Psychiatric: Pt behavior is normal without agitation   Micro: none  Cardiac tracings I have personally interpreted today:  ECG - Sinus brady, first degree block - 59  Pertinent Radiological findings (summarize): none   Lab Results  Component Value Date   WBC 6.5 05/29/2023   HGB 15.1 05/29/2023   HCT 45.4 05/29/2023   PLT 284.0 05/29/2023   GLUCOSE 93 05/29/2023   CHOL 229 (H) 05/29/2023   TRIG 87.0 05/29/2023   HDL 58.40 05/29/2023   LDLDIRECT 139.2 10/29/2012   LDLCALC 153 (H) 05/29/2023   ALT 16 05/29/2023   AST 21 05/29/2023   NA 139 05/29/2023   K 4.1 05/29/2023   CL 103 05/29/2023   CREATININE 1.19 05/29/2023   BUN 13 05/29/2023   CO2 28 05/29/2023   TSH 1.42 05/29/2023   PSA 0.22 07/20/2021   INR 1.0 12/08/2021   HGBA1C 6.2 05/29/2023   Assessment/Plan:  JUANJOSE MOJICA is a 73 y.o. Black or African American [2] male with  has a past medical history of BENIGN PROSTATIC HYPERTROPHY (05/06/2010), COMMON MIGRAINE (07/31/2007), GERD (07/31/2007), HYPERLIPIDEMIA (01/04/2007), HYPERTENSION (01/04/2007), OVERACTIVE BLADDER (03/06/2007), and Prostate cancer (HCC)  (02/04/2015).  Hyperglycemia Lab Results  Component Value Date   HGBA1C 6.2 05/29/2023   Stable, pt to continue current medical treatment  - diet, wt control   Primary hypertension BP Readings from Last 3 Encounters:  05/29/23 (!) 154/90  03/29/23 130/82  02/27/23 114/68   Uncontrolled, likely situational, pt to continue medical treatment lotensin 40 every day, card CD 120 every day, hct 25 every day   Vitamin D deficiency Last vitamin D Lab Results  Component Value Date   VD25OH 45.37 10/17/2021   Stable, cont oral replacement   Chest pain Atypical, Ecg reviewd, for cxr, and lab inclding d dimer and troponin  Hypercholesterolemia Lab Results  Component Value Date   LDLCALC 153 (H) 05/29/2023   Uncontrolled, pt to restart ipitor 40 qd   Followup: Return in about 6 months (around 11/26/2023).  Oliver Barre, MD 06/01/2023 10:43 PM [facility]

## 2023-05-29 NOTE — Patient Instructions (Signed)
Your EKG was done today  Please continue all other medications as before, and refills have been done if requested.  Please have the pharmacy call with any other refills you may need.  Please continue your efforts at being more active, low cholesterol diet, and weight control.  Please keep your appointments with your specialists as you may have planned  Please go to the XRAY Department in the first floor for the x-ray testing  Please go to the LAB at the blood drawing area for the tests to be done  You will be contacted by phone if any changes need to be made immediately.  Otherwise, you will receive a letter about your results with an explanation, but please check with MyChart first.  Please make an Appointment to return in 6 months, or sooner if needed,

## 2023-06-01 ENCOUNTER — Encounter: Payer: Self-pay | Admitting: Internal Medicine

## 2023-06-01 DIAGNOSIS — E7800 Pure hypercholesterolemia, unspecified: Secondary | ICD-10-CM | POA: Insufficient documentation

## 2023-06-01 NOTE — Assessment & Plan Note (Signed)
Atypical, Ecg reviewd, for cxr, and lab inclding d dimer and troponin

## 2023-06-01 NOTE — Assessment & Plan Note (Signed)
BP Readings from Last 3 Encounters:  05/29/23 (!) 154/90  03/29/23 130/82  02/27/23 114/68   Uncontrolled, likely situational, pt to continue medical treatment lotensin 40 every day, card CD 120 every day, hct 25 every day

## 2023-06-01 NOTE — Assessment & Plan Note (Signed)
Lab Results  Component Value Date   LDLCALC 153 (H) 05/29/2023   Uncontrolled, pt to restart ipitor 40 qd

## 2023-06-01 NOTE — Assessment & Plan Note (Signed)
Lab Results  Component Value Date   HGBA1C 6.2 05/29/2023   Stable, pt to continue current medical treatment  - diet, wt control

## 2023-06-01 NOTE — Assessment & Plan Note (Signed)
Last vitamin D Lab Results  Component Value Date   VD25OH 45.37 10/17/2021   Stable, cont oral replacement

## 2023-06-11 ENCOUNTER — Other Ambulatory Visit (HOSPITAL_BASED_OUTPATIENT_CLINIC_OR_DEPARTMENT_OTHER): Payer: Self-pay | Admitting: Cardiology

## 2023-06-11 DIAGNOSIS — I471 Supraventricular tachycardia, unspecified: Secondary | ICD-10-CM

## 2023-07-06 DIAGNOSIS — H35352 Cystoid macular degeneration, left eye: Secondary | ICD-10-CM | POA: Diagnosis not present

## 2023-07-06 DIAGNOSIS — H02834 Dermatochalasis of left upper eyelid: Secondary | ICD-10-CM | POA: Diagnosis not present

## 2023-07-06 DIAGNOSIS — H26491 Other secondary cataract, right eye: Secondary | ICD-10-CM | POA: Diagnosis not present

## 2023-07-06 DIAGNOSIS — H0102B Squamous blepharitis left eye, upper and lower eyelids: Secondary | ICD-10-CM | POA: Diagnosis not present

## 2023-07-06 DIAGNOSIS — H02831 Dermatochalasis of right upper eyelid: Secondary | ICD-10-CM | POA: Diagnosis not present

## 2023-07-06 DIAGNOSIS — H0102A Squamous blepharitis right eye, upper and lower eyelids: Secondary | ICD-10-CM | POA: Diagnosis not present

## 2023-07-06 DIAGNOSIS — H401131 Primary open-angle glaucoma, bilateral, mild stage: Secondary | ICD-10-CM | POA: Diagnosis not present

## 2023-07-06 DIAGNOSIS — Z961 Presence of intraocular lens: Secondary | ICD-10-CM | POA: Diagnosis not present

## 2023-07-08 ENCOUNTER — Other Ambulatory Visit: Payer: Self-pay | Admitting: Internal Medicine

## 2023-07-09 ENCOUNTER — Other Ambulatory Visit: Payer: Self-pay

## 2023-07-09 MED ORDER — KETOROLAC TROMETHAMINE 0.5 % OP SOLN
1.0000 [drp] | Freq: Four times a day (QID) | OPHTHALMIC | 0 refills | Status: AC
  Filled 2023-07-09: qty 5, 25d supply, fill #0

## 2023-08-01 ENCOUNTER — Other Ambulatory Visit: Payer: Self-pay

## 2023-08-01 ENCOUNTER — Other Ambulatory Visit: Payer: Self-pay | Admitting: Internal Medicine

## 2023-08-06 ENCOUNTER — Other Ambulatory Visit: Payer: Self-pay

## 2023-08-06 ENCOUNTER — Ambulatory Visit
Admission: EM | Admit: 2023-08-06 | Discharge: 2023-08-06 | Disposition: A | Discharge status: Home / Self Care | Attending: Family Medicine | Admitting: Family Medicine

## 2023-08-06 DIAGNOSIS — T161XXA Foreign body in right ear, initial encounter: Secondary | ICD-10-CM | POA: Diagnosis not present

## 2023-08-06 NOTE — ED Triage Notes (Addendum)
 Pt presents with complaints of a foreign object in right ear. Pt states he was working out in the yard on Saturday 3/29 and felt "something" go in his ear. Unsure if it fell out. Pt currently denies pain.

## 2023-08-06 NOTE — ED Provider Notes (Signed)
 [name]    CSN: [id number] Arrival date & time: 08/06/23  1506      History   Chief Complaint Chief Complaint  Patient presents with   Ear Problem    HPI [name] [name] is a 73 y.o. male.   The history is provided by the patient.  Right ear feels blocked may have something in there, onset this weekend after working outside.  Denies pain, drainage  Past Medical History:  Diagnosis Date   BENIGN PROSTATIC HYPERTROPHY 05/06/2010   COMMON MIGRAINE 07/31/2007   GERD 07/31/2007   HYPERLIPIDEMIA 01/04/2007   HYPERTENSION 01/04/2007   OVERACTIVE BLADDER 03/06/2007   Prostate cancer (HCC) 02/04/2015    Patient Active Problem List   Diagnosis Date Noted   Hypercholesterolemia 06/01/2023   Right shoulder pain 11/06/2022   OAB (overactive bladder) 11/06/2022   SVT (supraventricular tachycardia) (HCC) 07/06/2022   Anxiety 04/23/2022   Asthma 03/28/2022   Hypotension 12/11/2021   Diverticulosis of intestine with bleeding 12/11/2021   GI bleed 12/10/2021   Primary hypertension 12/10/2021   Acute blood loss anemia 12/10/2021   Lower GI bleeding 12/08/2021   Bradycardia 07/20/2021   Myofascial pain syndrome, cervical 04/19/2021   Vitamin D deficiency 09/25/2019   Dysuria 05/15/2019   Palpitation 02/25/2019   Paroxysmal tachycardia (HCC) 02/25/2019   Tachycardia 01/14/2019   Generalized anxiety disorder with panic attacks 09/25/2018   Mid back pain on right side 05/14/2018   Hyperglycemia 03/26/2018   Low back pain 04/19/2017   Lightheadedness 10/26/2016   Eustachian tube dysfunction, right 08/17/2016   Allergic rhinitis 08/17/2016   Erectile dysfunction 08/17/2016   Diastolic dysfunction 05/17/2016   Syncope 04/04/2016   Weakness 03/26/2015   Malignant neoplasm of prostate (HCC) 02/23/2015   Chest pain 10/01/2013   Cough 08/04/2013   Asthmatic bronchitis 07/15/2013   Microhematuria 05/09/2012   Constipation 08/13/2011   Ventral hernia 08/13/2011    Umbilical hernia 08/13/2011   Preventative health care 04/25/2011   Nocturia 06/29/2010   BPH (benign prostatic hyperplasia) 05/06/2010   Backache 05/06/2010   FREQUENCY, URINARY 11/30/2008   GERD 07/31/2007    Past Surgical History:  Procedure Laterality Date   BIOPSY  12/11/2021   Procedure: BIOPSY;  Surgeon: Lynann Bologna, MD;  Location[facility] ENDOSCOPY;  Service: Gastroenterology;;   COLONOSCOPY  2009   ESOPHAGOGASTRODUODENOSCOPY (EGD) WITH PROPOFOL N/A 12/11/2021   Procedure: ESOPHAGOGASTRODUODENOSCOPY (EGD) WITH PROPOFOL;  Surgeon: Lynann Bologna, MD;  Location: [facility] ENDOSCOPY;  Service: Gastroenterology;  Laterality: N/A;       Home Medications    Prior to Admission medications   Medication Sig Start Date End Date Taking? Authorizing Provider  aspirin 81 MG tablet Take 1 tablet (81 mg total) by mouth daily. 12/18/21   Narda Bonds, MD  atorvastatin (LIPITOR) 40 MG tablet TAKE 1 TABLET BY MOUTH EVERY DAY 11/27/22   Corwin Levins, MD  benazepril (LOTENSIN) 40 MG tablet TAKE 1 TABLET BY MOUTH EVERY DAY 11/07/22   Corwin Levins, MD  Cholecalciferol 50 MCG (2000 UT) TABS 1 tab by mouth once daily Patient taking differently: Take 2,000 Units by mouth daily. 07/20/21   Corwin Levins, MD  diltiazem (CARDIZEM CD) 120 MG 24 hr capsule TAKE 1 CAPSULE BY MOUTH EVERY DAY 06/11/23   Jodelle Red, MD  dorzolamide-timolol (COSOPT) 2-0.5 % ophthalmic solution INSTILL 1 DROP INTO BOTH EYES TWICE A DAY 08/01/23   Corwin Levins, MD  hydrochlorothiazide (HYDRODIURIL) 25 MG tablet Take 25 mg by  mouth daily. 09/18/22   [provider]  ketorolac (ACULAR) 0.5 % ophthalmic solution Place 1 drop into the left eye 4 (four) times daily. 07/09/23   Corwin Levins, MD  latanoprost (XALATAN) 0.005 % ophthalmic solution Place 1 drop into both eyes at bedtime. 11/09/17   [provider]  meloxicam (MOBIC) 15 MG tablet TAKE 1 TABLET BY MOUTH EVERY DAY AS NEEDED FOR PAIN 05/03/23   Corwin Levins, MD   pantoprazole (PROTONIX) 40 MG tablet Take 1 tablet (40 mg total) by mouth daily. 08/08/22   Hilarie Fredrickson, MD  polyethylene glycol [facility] / Ethelene Hal) packet Take 17 g by mouth as needed for mild constipation.    [provider]  potassium chloride (KLOR-CON) 10 MEQ tablet TAKE 1 TABLET BY MOUTH EVERY DAY 02/22/23   Corwin Levins, MD  solifenacin (VESICARE) 5 MG tablet Take 1 tablet (5 mg total) by mouth daily. 11/06/22   Corwin Levins, MD  spironolactone (ALDACTONE) 25 MG tablet Take 0.5 tablets (12.5 mg total) by mouth daily. 03/29/23 03/27/24  Jodelle Red, MD  SYSTANE ULTRA 0.4-0.3 % SOLN Place 1 drop into both eyes daily as needed (dryness). 08/14/19   [provider]  tadalafil (CIALIS) 20 MG tablet Take 1 tablet (20 mg total) by mouth daily as needed for erectile dysfunction. 04/09/23   Corwin Levins, MD    Family History Family History  Problem Relation Age of Onset   Lung cancer Father 61   Heart disease Brother 44       died from MI    Colon cancer Neg Hx    Esophageal cancer Neg Hx    Rectal cancer Neg Hx    Stomach cancer Neg Hx     Social History Social History   Tobacco Use   Smoking status: Never   Smokeless tobacco: Never  Vaping Use   Vaping status: Never Used  Substance Use Topics   Alcohol use: No    Alcohol/week: 0.0 standard drinks of alcohol   Drug use: No     Allergies   Beta adrenergic blockers, Celebrex [celecoxib], Crestor [rosuvastatin], Detrol [tolterodine], Ditropan [oxybutynin], Hydralazine, Norvasc [amlodipine besylate], Rapaflo [silodosin], Tadalafil, Tamsulosin, and Toprol xl [metoprolol]   Review of Systems Review of Systems   Physical Exam Triage Vital Signs ED Triage Vitals  Encounter Vitals Group     BP 08/06/23 1534 (!) 160/69     Systolic BP Percentile --      Diastolic BP Percentile --      Pulse Rate 08/06/23 1534 67     Resp 08/06/23 1534 20     Temp 08/06/23 1534 98.1 F (36.7 C)     Temp Source  08/06/23 1534 Oral     SpO2 08/06/23 1534 95 %     Weight 08/06/23 1535 200 lb (90.7 kg)     Height 08/06/23 1535 5\' 6"  (1.676 m)     Head Circumference --      Peak Flow --      Pain Score 08/06/23 1535 0     Pain Loc --      Pain Education --      Exclude from Growth Chart --    No data found.  Updated Vital Signs BP (!) 160/69 (BP Location: Right Arm)   Pulse 67   Temp 98.1 F (36.7 C) (Oral)   Resp 20   Ht 5\' 6"  (1.676 m)   Wt 200 lb (90.7 kg)  SpO2 95%   BMI 32.28 kg/m   Visual Acuity Right Eye Distance:   Left Eye Distance:   Bilateral Distance:    Right Eye Near:   Left Eye Near:    Bilateral Near:     Physical Exam Vitals and nursing note reviewed.  HENT:     Head: Normocephalic and atraumatic.     Ears:     Comments: Right canal occluded with tan hard substance possibly cerumen left canal clear TM normal Cardiovascular:     Rate and Rhythm: Normal rate.  Pulmonary:     Effort: Pulmonary effort is normal. No respiratory distress.  Skin:    General: Skin is warm and dry.  Neurological:     Mental Status: He is alert and oriented to person, place, and time.  Psychiatric:        Mood and Affect: Mood normal.      UC Treatments / Results  Labs (all labs ordered are listed, but only abnormal results are displayed) Labs Reviewed - No data to display  EKG   Radiology No results found.  Procedures Procedures (including critical care time)  Medications Ordered in UC Medications - No data to display  Initial Impression / Assessment and Plan / UC Course  I have reviewed the triage vital signs and the nursing notes.  Pertinent labs & imaging results that were available during my care of the patient were reviewed by me and considered in my medical decision making (see chart for details).     Attempted removal right canal with curette patient did not tolerate procedure well will try irrigation Right ear irrigated by staff states piece of wood  came out of the ear, some bleeding was noted.  Patient examined after procedure has superficial bleeding from canal TM is intact Final Clinical Impressions(s) / UC Diagnoses   Final diagnoses:  None   Discharge Instructions   None    ED Prescriptions   None    PDMP not reviewed this encounter.   Meliton Rattan, [place] 08/06/23 [phone]

## 2023-10-12 ENCOUNTER — Ambulatory Visit: Payer: Medicare Other

## 2023-10-12 VITALS — Ht 67.0 in | Wt 200.0 lb

## 2023-10-12 DIAGNOSIS — Z0000 Encounter for general adult medical examination without abnormal findings: Secondary | ICD-10-CM

## 2023-10-12 NOTE — Patient Instructions (Signed)
 Mr. Laura , Thank you for taking time out of your busy schedule to complete your Annual Wellness Visit with me. I enjoyed our conversation and look forward to speaking with you again next year. I, as well as your care team,  appreciate your ongoing commitment to your health goals. Please review the following plan we discussed and let me know if I can assist you in the future. Your Game plan/ To Do List    Follow up Visits: Next Medicare AWV with our clinical staff: 10/17/2024.   Have you seen your provider in the last 6 months (3 months if uncontrolled diabetes)? Yes Next Office Visit with your provider: Last office visit on 05/29/2023.  Patient stated that he will call office to schedule a follow appointment soon.  Clinician Recommendations:  Aim for 30 minutes of exercise or brisk walking, 6-8 glasses of water, and 5 servings of fruits and vegetables each day. Keep up the good work.      This is a list of the screening recommended for you and due dates:  Health Maintenance  Topic Date Due   Zoster (Shingles) Vaccine (2 of 2) 07/10/2023   Flu Shot  12/07/2023   Medicare Annual Wellness Visit  10/11/2024   Colon Cancer Screening  12/20/2027   DTaP/Tdap/Td vaccine (3 - Td or Tdap) 06/12/2028   Pneumonia Vaccine  Completed   Hepatitis C Screening  Completed   HPV Vaccine  Aged Out   Meningitis B Vaccine  Aged Out   COVID-19 Vaccine  Discontinued    Advanced directives: (Copy Requested) Please bring a copy of your health care power of attorney and living will to the office to be added to your chart at your convenience. You can mail to [facility] [address] [place], [place] [address] or email to [email or web address] Advance Care Planning is important because it:  [x]  Makes sure you receive the medical care that is consistent with your values, goals, and preferences  [x]  It provides guidance to your family and loved ones and reduces their decisional burden about  whether or not they are making the right decisions based on your wishes.  Follow the link provided in your after visit summary or read over the paperwork we have mailed to you to help you started getting your Advance Directives in place. If you need assistance in completing these, please reach out to us  so that we can help you!  See attachments for Preventive Care and Fall Prevention Tips.

## 2023-10-12 NOTE — Progress Notes (Signed)
 Subjective:   [name] [name] is a 73 y.o. who presents for a Medicare Wellness preventive visit.  As a reminder, Annual Wellness Visits don't include a physical exam, and some assessments may be limited, especially if this visit is performed virtually. We may recommend an in-person follow-up visit with your provider if needed.  Visit Complete: Virtual I connected with  [name] [name] on 10/12/23 by a audio enabled telemedicine application and verified that I am speaking with the correct person using two identifiers.  Patient Location: Home  Provider Location: Office/Clinic  I discussed the limitations of evaluation and management by telemedicine. The patient expressed understanding and agreed to proceed.  Vital Signs: Because this visit was a virtual/telehealth visit, some criteria may be missing or patient reported. Any vitals not documented were not able to be obtained and vitals that have been documented are patient reported.  VideoDeclined- This patient declined Librarian, academic. Therefore the visit was completed with audio only.  Persons Participating in Visit: Patient.  AWV Questionnaire: Yes: Patient Medicare AWV questionnaire was completed by the patient on 10/08/2023; I have confirmed that all information answered by patient is correct and no changes since this date.  Cardiac Risk Factors include: advanced age (>41men, >16 women);hypertension;male gender;Other (see comment), Risk factor comments: BPH     Objective:     Today's Vitals   10/12/23 1425  Weight: 200 lb (90.7 kg)  Height: 5\' 7"  (1.702 m)   Body mass index is 31.32 kg/m.     10/12/2023    2:39 PM 10/04/2022    3:08 PM 05/31/2022    2:20 PM 12/11/2021   12:00 PM 12/11/2021    8:23 AM 09/30/2021   10:24 AM 09/25/2018   10:33 AM  Advanced Directives  Does Patient Have a Medical Advance Directive? Yes Yes Yes Yes Yes No No  Type of Estate agent of  [place];Living will Healthcare Power of [place];Living will Healthcare Power of [place];Living will Living will;Healthcare Power of Attorney Living will;Healthcare Power of Attorney    Does patient want to make changes to medical advance directive?    No - Patient declined     Copy of Healthcare Power of Attorney in Chart? No - copy requested No - copy requested  No - copy requested No - copy requested    Would patient like information on creating a medical advance directive?      No - Patient declined Yes (ED - Information included in AVS)    Current Medications (verified) Outpatient Encounter Medications as of 10/12/2023  Medication Sig   alfuzosin (UROXATRAL) 10 MG 24 hr tablet Take 10 mg by mouth every other day.   aspirin  81 MG tablet Take 1 tablet (81 mg total) by mouth daily.   atorvastatin  (LIPITOR) 40 MG tablet TAKE 1 TABLET BY MOUTH EVERY DAY   benazepril  (LOTENSIN ) 40 MG tablet TAKE 1 TABLET BY MOUTH EVERY DAY   Cholecalciferol  50 MCG (2000 UT) TABS 1 tab by mouth once daily (Patient taking differently: Take 2,000 Units by mouth daily.)   diltiazem  (CARDIZEM  CD) 120 MG 24 hr capsule TAKE 1 CAPSULE BY MOUTH EVERY DAY   dorzolamide -timolol  (COSOPT ) 2-0.5 % ophthalmic solution INSTILL 1 DROP INTO BOTH EYES TWICE A DAY   hydrochlorothiazide  (HYDRODIURIL ) 25 MG tablet Take 25 mg by mouth daily.   ketorolac  (ACULAR ) 0.5 % ophthalmic solution Place 1 drop into the left eye 4 (four) times daily.   latanoprost  (XALATAN ) 0.005 %  ophthalmic solution Place 1 drop into both eyes at bedtime.   polyethylene glycol (MIRALAX / GLYCOLAX) packet Take 17 g by mouth as needed for mild constipation.   potassium chloride  (KLOR-CON ) 10 MEQ tablet TAKE 1 TABLET BY MOUTH EVERY DAY   SHINGRIX injection    solifenacin  (VESICARE ) 5 MG tablet Take 1 tablet (5 mg total) by mouth daily.   spironolactone  (ALDACTONE ) 25 MG tablet Take 0.5 tablets (12.5 mg total) by mouth daily.   SYSTANE ULTRA 0.4-0.3 % SOLN Place  1 drop into both eyes daily as needed (dryness).   tadalafil  (CIALIS ) 20 MG tablet Take 1 tablet (20 mg total) by mouth daily as needed for erectile dysfunction.   meloxicam  (MOBIC ) 15 MG tablet TAKE 1 TABLET BY MOUTH EVERY DAY AS NEEDED FOR PAIN (Patient not taking: Reported on 10/12/2023)   pantoprazole  (PROTONIX ) 40 MG tablet Take 1 tablet (40 mg total) by mouth daily. (Patient not taking: Reported on 10/12/2023)   No facility-administered encounter medications on file as of 10/12/2023.    Allergies (verified) Beta adrenergic blockers, Celebrex [celecoxib], Crestor  [rosuvastatin ], Detrol  [tolterodine ], Ditropan  [oxybutynin ], Hydralazine , Norvasc  [amlodipine  besylate], Rapaflo  [silodosin ], Tadalafil , Tamsulosin , and Toprol  xl [metoprolol ]   History: Past Medical History:  Diagnosis Date   BENIGN PROSTATIC HYPERTROPHY 05/06/2010   COMMON MIGRAINE 07/31/2007   GERD 07/31/2007   HYPERLIPIDEMIA 01/04/2007   HYPERTENSION 01/04/2007   OVERACTIVE BLADDER 03/06/2007   Prostate cancer (HCC) 02/04/2015   Past Surgical History:  Procedure Laterality Date   BIOPSY  12/11/2021   Procedure: BIOPSY;  Surgeon: Lajuan Pila, MD;  Location: [facility] ENDOSCOPY;  Service: Gastroenterology;;   COLONOSCOPY  2009   ESOPHAGOGASTRODUODENOSCOPY (EGD) WITH PROPOFOL  N/A 12/11/2021   Procedure: ESOPHAGOGASTRODUODENOSCOPY (EGD) WITH PROPOFOL ;  Surgeon: Lajuan Pila, MD;  Location: [facility] ENDOSCOPY;  Service: Gastroenterology;  Laterality: N/A;   Family History  Problem Relation Age of Onset   Lung cancer Father 87   Heart disease Brother 69       died from MI    Colon cancer Neg Hx    Esophageal cancer Neg Hx    Rectal cancer Neg Hx    Stomach cancer Neg Hx    Social History   Socioeconomic History   Marital status: Married    Spouse name: dorothy   Number of children: 3   Years of education: Not on file   Highest education level: 12th grade  Occupational History   Occupation: Education officer, environmental  Tobacco Use   Smoking  status: Never   Smokeless tobacco: Never  Vaping Use   Vaping status: Never Used  Substance and Sexual Activity   Alcohol use: No    Alcohol/week: 0.0 standard drinks of alcohol   Drug use: No   Sexual activity: Not Currently  Other Topics Concern   Not on file  Social History Narrative   Lives with wife/2025   Social Drivers of Health   Financial Resource Strain: Low Risk  (10/12/2023)   Overall Financial Resource Strain (CARDIA)    Difficulty of Paying Living Expenses: Not hard at all  Food Insecurity: No Food Insecurity (10/12/2023)   Hunger Vital Sign    Worried About Running Out of Food in the Last Year: Never true    Ran Out of Food in the Last Year: Never true  Transportation Needs: No Transportation Needs (10/12/2023)   PRAPARE - Administrator, Civil Service (Medical): No    Lack of Transportation (Non-Medical): No  Physical Activity: Insufficiently Active (10/12/2023)  Exercise Vital Sign    Days of Exercise per Week: 3 days    Minutes of Exercise per Session: 20 min  Stress: No Stress Concern Present (10/12/2023)   [employer] of Occupational Health - Occupational Stress Questionnaire    Feeling of Stress : Not at all  Social Connections: Socially Integrated (10/12/2023)   Social Connection and Isolation Panel [NHANES]    Frequency of Communication with Friends and Family: More than three times a week    Frequency of Social Gatherings with Friends and Family: Three times a week    Attends Religious Services: More than 4 times per year    Active Member of Clubs or Organizations: Yes    Attends Engineer, structural: More than 4 times per year    Marital Status: Married    Tobacco Counseling Counseling given: Not Answered    Clinical Intake:  Pre-visit preparation completed: Yes  Pain : No/denies pain     BMI - recorded: 31.32 Nutritional Status: BMI > 30  Obese Nutritional Risks: None Diabetes: No  Lab Results  Component Value  Date   HGBA1C 6.2 05/29/2023   HGBA1C 6.0 10/17/2021   HGBA1C 6.3 07/20/2021     How often do you need to have someone help you when you read instructions, pamphlets, or other written materials from your doctor or pharmacy?: 1 - Never  Interpreter Needed?: No  Information entered by :: Ayza Ripoll, RMA   Activities of Daily Living     10/08/2023    7:11 AM  In your present state of health, do you have any difficulty performing the following activities:  Hearing? 0  Comment Wears hearing aides/sometimes per pt  Vision? 0  Difficulty concentrating or making decisions? 0  Walking or climbing stairs? 0  Dressing or bathing? 0  Doing errands, shopping? 0  Preparing Food and eating ? N  Using the Toilet? N  In the past six months, have you accidently leaked urine? N  Do you have problems with loss of bowel control? N  Managing your Finances? N  Housekeeping or managing your Housekeeping? N    Patient Care Team: Roslyn Coombe, MD as PCP - General Sheryle Donning, MD as PCP - Cardiology (Cardiology) Tobin Forts, MD as Consulting Physician (Gastroenterology) Charity Conch, DPM as Consulting Physician (Podiatry)  I have updated your Care Teams any recent Medical Services you may have received from other providers in the past year.     Assessment:    This is a routine wellness examination for [place].  Hearing/Vision screen Hearing Screening - Comments:: Wears hearing aides/sometimes per pt Vision Screening - Comments:: Wears eyeglasses/Dr. Candi Chafe   Goals Addressed             This Visit's Progress    My goal for 2024 is to lose 10 pounds.       Still working on it/2025       Depression Screen     10/12/2023    2:42 PM 05/29/2023    9:47 AM 02/27/2023    2:55 PM 11/06/2022    2:38 PM 10/04/2022    3:13 PM 04/21/2022    1:01 PM 03/28/2022    8:24 AM  PHQ 2/9 Scores  PHQ - 2 Score 3 0 0 0 0 0 0  PHQ- 9 Score 3   0 0 0 0    Fall Risk     10/08/2023     7:11 AM 05/29/2023  9:47 AM 02/27/2023    2:55 PM 11/06/2022    2:38 PM 10/04/2022    3:09 PM  Fall Risk   Falls in the past year? 0 0 0 0 0  Number falls in past yr: 0 0 0 0 0  Injury with Fall? 0 0 0 0 0  Risk for fall due to :  No Fall Risks No Fall Risks No Fall Risks No Fall Risks  Follow up Falls evaluation completed;Falls prevention discussed Falls evaluation completed Falls evaluation completed Falls evaluation completed Falls prevention discussed    MEDICARE RISK AT HOME:  Medicare Risk at Home Any stairs in or around the home?: (Patient-Rptd) Yes If so, are there any without handrails?: (Patient-Rptd) No Home free of loose throw rugs in walkways, pet beds, electrical cords, etc?: (Patient-Rptd) Yes Adequate lighting in your home to reduce risk of falls?: (Patient-Rptd) Yes Life alert?: (Patient-Rptd) No Use of a cane, walker or w/c?: (Patient-Rptd) No Grab bars in the bathroom?: (Patient-Rptd) No Shower chair or bench in shower?: (Patient-Rptd) No Elevated toilet seat or a handicapped toilet?: (Patient-Rptd) No  TIMED UP AND GO:  Was the test performed?  No  Cognitive Function: Declined/Normal: No cognitive concerns noted by patient or family. Patient alert, oriented, able to answer questions appropriately and recall recent events. No signs of memory loss or confusion.        10/04/2022    3:18 PM 09/30/2021   10:25 AM  6CIT Screen  What Year? 0 points 0 points  What month? 0 points 0 points  What time? 0 points 0 points  Count back from 20 0 points 0 points  Months in reverse 0 points 2 points  Repeat phrase 0 points 0 points  Total Score 0 points 2 points    Immunizations Immunization History  Administered Date(s) Administered   Fluad Quad(high Dose 65+) 01/14/2019, 01/10/2022   Fluad Trivalent(High Dose 65+) 02/05/2023   Influenza Split 03/15/2012   Influenza Whole 03/08/2006, 03/09/2008, 02/05/2009   Influenza, High Dose Seasonal PF 02/07/2018,  02/17/2021   Influenza,inj,Quad PF,6+ Mos 02/03/2013, 02/27/2014, 02/23/2015, 02/24/2016, 02/06/2017   Influenza-Unspecified 02/07/2018   PFIZER(Purple Top)SARS-COV-2 Vaccination 05/20/2019, 06/09/2019   Pfizer Covid-19 Vaccine Bivalent Booster 52yrs & up 02/24/2021, 02/24/2021   Pneumococcal Conjugate-13 07/15/2013, 04/08/2014   Pneumococcal Polysaccharide-23 09/23/2015   Respiratory Syncytial Virus Vaccine,Recomb Aduvanted(Arexvy) 02/26/2023   Td 04/20/2008   Tdap 06/12/2018   Zoster Recombinant(Shingrix) 05/15/2023   Zoster, Live 05/06/2010    Screening Tests Health Maintenance  Topic Date Due   Zoster Vaccines- Shingrix (2 of 2) 07/10/2023   INFLUENZA VACCINE  12/07/2023   Medicare Annual Wellness (AWV)  10/11/2024   Colonoscopy  12/20/2027   DTaP/Tdap/Td (3 - Td or Tdap) 06/12/2028   Pneumonia Vaccine 36+ Years old  Completed   Hepatitis C Screening  Completed   HPV VACCINES  Aged Out   Meningococcal B Vaccine  Aged Out   COVID-19 Vaccine  Discontinued    Health Maintenance  Health Maintenance Due  Topic Date Due   Zoster Vaccines- Shingrix (2 of 2) 07/10/2023   Health Maintenance Items Addressed: See Nurse Notes at the end of this note  Additional Screening:  Vision Screening: Recommended annual ophthalmology exams for early detection of glaucoma and other disorders of the eye. Would you like a referral to an eye doctor? No    Dental Screening: Recommended annual dental exams for proper oral hygiene  Community Resource Referral / Chronic Care Management: CRR required this visit?  No   CCM required this visit?  No   Plan:    I have personally reviewed and noted the following in the patient's chart:   Medical and social history Use of alcohol, tobacco or illicit drugs  Current medications and supplements including opioid prescriptions. Patient is not currently taking opioid prescriptions. Functional ability and status Nutritional status Physical  activity Advanced directives List of other physicians Hospitalizations, surgeries, and ER visits in previous 12 months Vitals Screenings to include cognitive, depression, and falls Referrals and appointments  In addition, I have reviewed and discussed with patient certain preventive protocols, quality metrics, and best practice recommendations. A written personalized care plan for preventive services as well as general preventive health recommendations were provided to patient.   Ceaser Ebeling L Kristyna Bradstreet, CMA   10/12/2023   After Visit Summary: (MyChart) Due to this being a telephonic visit, the after visit summary with patients personalized plan was offered to patient via MyChart   Notes: Nothing significant to report at this time.

## 2023-10-22 ENCOUNTER — Other Ambulatory Visit: Payer: Self-pay | Admitting: Internal Medicine

## 2023-10-25 ENCOUNTER — Other Ambulatory Visit: Payer: Self-pay

## 2023-10-25 ENCOUNTER — Ambulatory Visit
Admission: EM | Admit: 2023-10-25 | Discharge: 2023-10-25 | Disposition: A | Discharge status: Home / Self Care | Source: Home / Self Care

## 2023-10-25 ENCOUNTER — Ambulatory Visit: Payer: Self-pay

## 2023-10-25 DIAGNOSIS — J209 Acute bronchitis, unspecified: Secondary | ICD-10-CM | POA: Diagnosis not present

## 2023-10-25 MED ORDER — ALBUTEROL SULFATE HFA 108 (90 BASE) MCG/ACT IN AERS: 1.0000 | INHALATION_SPRAY | Freq: Four times a day (QID) | RESPIRATORY_TRACT | 0 refills | Status: DC | PRN

## 2023-10-25 MED ORDER — AEROCHAMBER PLUS FLO-VU LARGE MISC
1.0000 | Freq: Once | Status: AC
  Administered 2023-10-25: 1

## 2023-10-25 MED ORDER — IPRATROPIUM-ALBUTEROL 0.5-2.5 (3) MG/3ML IN SOLN
3.0000 mL | Freq: Once | RESPIRATORY_TRACT | Status: AC
  Administered 2023-10-25: 3 mL via RESPIRATORY_TRACT

## 2023-10-25 MED ORDER — PREDNISONE 20 MG PO TABS: 40.0000 mg | ORAL_TABLET | Freq: Every day | ORAL | 0 refills | Status: AC

## 2023-10-25 NOTE — Telephone Encounter (Signed)
 FYI Only or Action Required?: FYI only for provider.  Patient was last seen in primary care on 05/29/2023 by Roslyn Coombe, MD. Called Nurse Triage reporting Shortness of Breath. Symptoms began several days ago. Interventions attempted: Nothing. Symptoms are: gradually worsening.  Triage Disposition: See HCP Within 4 Hours (Or PCP Triage)  Patient/caregiver understands and will follow disposition?: Yes  - UC        Copied from CRM [id number]. Topic: Clinical - Red Word Triage >> Oct 25, 2023  8:09 AM Alyse July wrote: Red Word that prompted transfer to Nurse Triage: SOB   ----------------------------------------------------------------------- From previous Reason for Contact - Scheduling: Patient/patient representative is calling to schedule an appointment. Refer to attachments for appointment information. Reason for Disposition  [1] MILD difficulty breathing (e.g., minimal/no SOB at rest, SOB with walking, pulse <100) AND [2] still present when not coughing  Answer Assessment - Initial Assessment Questions 1. ONSET: When did the cough begin?      2 days 2. SEVERITY: How bad is the cough today?      mild 3. SPUTUM: Describe the color of your sputum (none, dry cough; clear, white, yellow, green)     yellow 4. HEMOPTYSIS: Are you coughing up any blood? If so ask: How much? (flecks, streaks, tablespoons, etc.)     no 5. DIFFICULTY BREATHING: Are you having difficulty breathing? If Yes, ask: How bad is it? (e.g., mild, moderate, severe)    - MILD: No SOB at rest, mild SOB with walking, speaks normally in sentences, can lie down, no retractions, pulse < 100.    - MODERATE: SOB at rest, SOB with minimal exertion and prefers to sit, cannot lie down flat, speaks in phrases, mild retractions, audible wheezing, pulse 100-120.    - SEVERE: Very SOB at rest, speaks in single words, struggling to breathe, sitting hunched forward, retractions, pulse > 120      Mild- feels like he  can't take a deep breath, chest congestion 6. FEVER: Do you have a fever? If Yes, ask: What is your temperature, how was it measured, and when did it start?     no 7. CARDIAC HISTORY: Do you have any history of heart disease? (e.g., heart attack, congestive heart failure)      no 8. LUNG HISTORY: Do you have any history of lung disease?  (e.g., pulmonary embolus, asthma, emphysema)     no 9. PE RISK FACTORS: Do you have a history of blood clots? (or: recent major surgery, recent prolonged travel, bedridden)     no 10. OTHER SYMPTOMS: Do you have any other symptoms? (e.g., runny nose, wheezing, chest pain)       no  Protocols used: Cough - Acute Non-Productive-A-AH

## 2023-10-25 NOTE — ED Notes (Signed)
Pt states he has not taken his BP medication today.

## 2023-10-25 NOTE — ED Triage Notes (Addendum)
 Pt presents with complaints of intermittent SOB x 1 week. Pt states my chest feels full when I try to take a deep breath in. Currently denies pain. Respirations regular/unlabored/even. O2 is 95% on room air in triage.   Pt does mention he received second dose of shingles vaccine one day before symptoms began. Unsure if they are related.

## 2023-10-25 NOTE — Discharge Instructions (Addendum)
 You were seen today for concerns of shortness of breath and chest tightness.  At this time I suspect that you may have an acute case of bronchitis. I have sent in a steroid called Prednisone  for you to take once per day for 5 days with breakfast.  Please be advised that steroids can cause increased hunger, irritability, elevated blood sugars and sleeplessness especially when taken later in the day.  The symptoms and side effects should resolve after you finish the course. I am also sending in an albuterol  inhaler for you to use as needed up to every 6 hours for coughing and shortness of breath.  We have provided you with a spacer to help with medication administration. If you find you are continuing to have shortness of breath, shortness of breath and trouble breathing gets worse, you develop fevers or chills, productive cough please return here for further evaluation

## 2023-10-25 NOTE — ED Provider Notes (Signed)
 [name] [name]    CSN: [id number] Arrival date & time: 10/25/23  0943      History   Chief Complaint Chief Complaint  Patient presents with   Shortness of Breath    HPI [name] [name] is a 73 y.o. male.   HPI  Pt reports concerns for SOB that has been ongoing for several days, intermittently  He states this feels similar to when he had bronchitis He denies fevers, chills, nasal congestion, rhinorrhea  He reports intermittent,mild cough that is productive  He also reports chest tightness  He denies previous hx of asthma or COPD. He was given a rescue inhaler about a year ago for suspected acute illness but no longer uses this    Past Medical History:  Diagnosis Date   BENIGN PROSTATIC HYPERTROPHY 05/06/2010   COMMON MIGRAINE 07/31/2007   GERD 07/31/2007   HYPERLIPIDEMIA 01/04/2007   HYPERTENSION 01/04/2007   OVERACTIVE BLADDER 03/06/2007   Prostate cancer (HCC) 02/04/2015    Patient Active Problem List   Diagnosis Date Noted   Hypercholesterolemia 06/01/2023   Right shoulder pain 11/06/2022   OAB (overactive bladder) 11/06/2022   SVT (supraventricular tachycardia) (HCC) 07/06/2022   Anxiety 04/23/2022   Asthma 03/28/2022   Hypotension 12/11/2021   Diverticulosis of intestine with bleeding 12/11/2021   GI bleed 12/10/2021   Primary hypertension 12/10/2021   Acute blood loss anemia 12/10/2021   Lower GI bleeding 12/08/2021   Bradycardia 07/20/2021   Myofascial pain syndrome, cervical 04/19/2021   Vitamin D  deficiency 09/25/2019   Dysuria 05/15/2019   Palpitation 02/25/2019   Paroxysmal tachycardia (HCC) 02/25/2019   Tachycardia 01/14/2019   Generalized anxiety disorder with panic attacks 09/25/2018   Mid back pain on right side 05/14/2018   Hyperglycemia 03/26/2018   Low back pain 04/19/2017   Lightheadedness 10/26/2016   Eustachian tube dysfunction, right 08/17/2016   Allergic rhinitis 08/17/2016   Erectile dysfunction 08/17/2016    Diastolic dysfunction 05/17/2016   Syncope 04/04/2016   Weakness 03/26/2015   Malignant neoplasm of prostate (HCC) 02/23/2015   Chest pain 10/01/2013   Cough 08/04/2013   Asthmatic bronchitis 07/15/2013   Microhematuria 05/09/2012   Constipation 08/13/2011   Ventral hernia 08/13/2011   Umbilical hernia 08/13/2011   Preventative health care 04/25/2011   Nocturia 06/29/2010   BPH (benign prostatic hyperplasia) 05/06/2010   Backache 05/06/2010   FREQUENCY, URINARY 11/30/2008   GERD 07/31/2007    Past Surgical History:  Procedure Laterality Date   BIOPSY  12/11/2021   Procedure: BIOPSY;  Surgeon: Lajuan Pila, MD;  Location[facility] ENDOSCOPY;  Service: Gastroenterology;;   COLONOSCOPY  2009   ESOPHAGOGASTRODUODENOSCOPY (EGD) WITH PROPOFOL  N/A 12/11/2021   Procedure: ESOPHAGOGASTRODUODENOSCOPY (EGD) WITH PROPOFOL ;  Surgeon: Lajuan Pila, MD;  Location: [facility] ENDOSCOPY;  Service: Gastroenterology;  Laterality: N/A;       Home Medications    Prior to Admission medications   Medication Sig Start Date End Date Taking? Authorizing Provider  albuterol  (VENTOLIN  HFA) 108 (90 Base) MCG/ACT inhaler Inhale 1-2 puffs into the lungs every 6 (six) hours as needed for wheezing or shortness of breath. 10/25/23  Yes Issiah Huffaker E, PA-C  predniSONE  (DELTASONE ) 20 MG tablet Take 2 tablets (40 mg total) by mouth daily for 5 days. 10/25/23 10/30/23 Yes Stephanne Greeley E, PA-C  alfuzosin (UROXATRAL) 10 MG 24 hr tablet Take 10 mg by mouth every other day. 09/08/23   [provider]  aspirin  81 MG tablet Take 1 tablet (81 mg total) by mouth  daily. 12/18/21   Verlyn Goad, MD  atorvastatin  (LIPITOR) 40 MG tablet TAKE 1 TABLET BY MOUTH EVERY DAY 11/27/22   Roslyn Coombe, MD  benazepril  (LOTENSIN ) 40 MG tablet TAKE 1 TABLET BY MOUTH EVERY DAY 10/22/23   Roslyn Coombe, MD  Cholecalciferol  50 MCG (2000 UT) TABS 1 tab by mouth once daily Patient taking differently: Take 2,000 Units by mouth daily. 07/20/21   Roslyn Coombe, MD  diltiazem  (CARDIZEM  CD) 120 MG 24 hr capsule TAKE 1 CAPSULE BY MOUTH EVERY DAY 06/11/23   Sheryle Donning, MD  dorzolamide -timolol  (COSOPT ) 2-0.5 % ophthalmic solution INSTILL 1 DROP INTO BOTH EYES TWICE A DAY 08/01/23   Roslyn Coombe, MD  hydrochlorothiazide  (HYDRODIURIL ) 25 MG tablet Take 25 mg by mouth daily. 09/18/22   [provider]  ketorolac  (ACULAR ) 0.5 % ophthalmic solution Place 1 drop into the left eye 4 (four) times daily. 07/09/23   Roslyn Coombe, MD  latanoprost  (XALATAN ) 0.005 % ophthalmic solution Place 1 drop into both eyes at bedtime. 11/09/17   [provider]  meloxicam  (MOBIC ) 15 MG tablet TAKE 1 TABLET BY MOUTH EVERY DAY AS NEEDED FOR PAIN Patient not taking: Reported on 10/12/2023 05/03/23   Roslyn Coombe, MD  pantoprazole  (PROTONIX ) 40 MG tablet Take 1 tablet (40 mg total) by mouth daily. Patient not taking: Reported on 10/12/2023 08/08/22   Tobin Forts, MD  polyethylene glycol (MIRALAX / GLYCOLAX) packet Take 17 g by mouth as needed for mild constipation.    [provider]  potassium chloride  (KLOR-CON ) 10 MEQ tablet TAKE 1 TABLET BY MOUTH EVERY DAY 02/22/23   Roslyn Coombe, MD  [facility] injection  05/15/23   [provider]  solifenacin  (VESICARE ) 5 MG tablet Take 1 tablet (5 mg total) by mouth daily. 11/06/22   Roslyn Coombe, MD  spironolactone  (ALDACTONE ) 25 MG tablet Take 0.5 tablets (12.5 mg total) by mouth daily. 03/29/23 03/27/24  Sheryle Donning, MD  SYSTANE ULTRA 0.4-0.3 % SOLN Place 1 drop into both eyes daily as needed (dryness). 08/14/19   [provider]  tadalafil  (CIALIS ) 20 MG tablet Take 1 tablet (20 mg total) by mouth daily as needed for erectile dysfunction. 04/09/23   Roslyn Coombe, MD    Family History Family History  Problem Relation Age of Onset   Lung cancer Father 76   Heart disease Brother 1       died from MI    Colon cancer Neg Hx    Esophageal cancer Neg Hx    Rectal cancer Neg Hx     Stomach cancer Neg Hx     Social History Social History   Tobacco Use   Smoking status: Never   Smokeless tobacco: Never  Vaping Use   Vaping status: Never Used  Substance Use Topics   Alcohol use: No    Alcohol/week: 0.0 standard drinks of alcohol   Drug use: No     Allergies   Beta adrenergic blockers, Celebrex [celecoxib], Crestor  [rosuvastatin ], Detrol  [tolterodine ], Ditropan  [oxybutynin ], Hydralazine , Norvasc  [amlodipine  besylate], Rapaflo  [silodosin ], Tadalafil , Tamsulosin , and Toprol  xl [metoprolol ]   Review of Systems Review of Systems  Constitutional:  Negative for chills and fever.  HENT:  Negative for congestion, ear pain, rhinorrhea and sore throat.   Respiratory:  Positive for cough (mild and productive of phlegm), chest tightness and shortness of breath. Negative for wheezing.   Gastrointestinal:  Negative for diarrhea, nausea and vomiting.  Musculoskeletal:  Negative for myalgias.  Skin:  Negative for rash.     Physical Exam Triage Vital Signs ED Triage Vitals  Encounter Vitals Group     BP 10/25/23 0948 (!) 173/78     Girls Systolic BP Percentile --      Girls Diastolic BP Percentile --      Boys Systolic BP Percentile --      Boys Diastolic BP Percentile --      Pulse Rate 10/25/23 0948 (!) 59     Resp 10/25/23 0948 20     Temp 10/25/23 0948 97.9 F (36.6 C)     Temp Source 10/25/23 0948 Oral     SpO2 10/25/23 0948 95 %     Weight 10/25/23 0948 200 lb (90.7 kg)     Height 10/25/23 0948 5' 7 (1.702 m)     Head Circumference --      Peak Flow --      Pain Score 10/25/23 0955 0     Pain Loc --      Pain Education --      Exclude from Growth Chart --    No data found.  Updated Vital Signs BP (!) 173/78 (BP Location: Right Arm)   Pulse (!) 59   Temp 97.9 F (36.6 C) (Oral)   Resp 20   Ht 5' 7 (1.702 m)   Wt 200 lb (90.7 kg)   SpO2 95%   BMI 31.32 kg/m   Visual Acuity Right Eye Distance:   Left Eye Distance:   Bilateral  Distance:    Right Eye Near:   Left Eye Near:    Bilateral Near:     Physical Exam Vitals reviewed.  Constitutional:      General: He is awake.     Appearance: Normal appearance. He is well-developed and well-groomed.  HENT:     Head: Normocephalic and atraumatic.   Cardiovascular:     Rate and Rhythm: Normal rate and regular rhythm.     Pulses: Normal pulses.          Radial pulses are 2+ on the right side and 2+ on the left side.     Heart sounds: Normal heart sounds. No murmur heard.    No friction rub. No gallop.  Pulmonary:     Effort: Pulmonary effort is normal.     Breath sounds: Normal breath sounds. Decreased air movement present. No decreased breath sounds, wheezing, rhonchi or rales.     Comments: Lungs are tight and patient has halting breath sounds. No wheezes, rales, rhonchi present Lung sounds and air movement improved after Duoneb treatment   Musculoskeletal:     Cervical back: Normal range of motion.     Right lower leg: No edema.     Left lower leg: No edema.   Neurological:     General: No focal deficit present.     Mental Status: He is alert and oriented to person, place, and time.   Psychiatric:        Attention and Perception: Attention and perception normal.        Mood and Affect: Mood and affect normal.        Speech: Speech normal.        Behavior: Behavior normal. Behavior is cooperative.        Thought Content: Thought content normal.        Cognition and Memory: Cognition normal.      [name] Treatments / Results  Labs (all labs ordered  are listed, but only abnormal results are displayed) Labs Reviewed - No data to display  EKG   Radiology No results found.  Procedures Procedures (including critical care time)  Medications Ordered in [name] Medications  ipratropium-albuterol  (DUONEB) 0.5-2.5 (3) MG/3ML nebulizer solution 3 mL (3 mLs Nebulization Given 10/25/23 1045)  AeroChamber Plus Flo-Vu Large MISC 1 each (1 each Other Given 10/25/23  1120)    Initial Impression / Assessment and Plan / [name] Course  I have reviewed the triage vital signs and the nursing notes.  Pertinent labs & imaging results that were available during my care of the patient were reviewed by me and considered in my medical decision making (see chart for details).     I reviewed CMP, CBC, and A1c results from 05/29/23 to assist with management plan  Final Clinical Impressions(s) / [name] Diagnoses   Final diagnoses:  Acute bronchitis, unspecified organism   Patient presents with concerns for shortness of breath, chest tightness that has been ongoing for several days. Physical exam is notable for decreased air movement and lungs sound tight. This improved following administration of Duoneb breathing treatment in clinic.  Will send patient home albuterol  inhaler as well as prednisone  40 mg p.o. daily burst to assist with pulmonary inflammation.  ED and return precautions reviewed and provided in after visit summary.  Follow-up as needed.    Discharge Instructions      You were seen today for concerns of shortness of breath and chest tightness.  At this time I suspect that you may have an acute case of bronchitis. I have sent in a steroid called Prednisone  for you to take once per day for 5 days with breakfast.  Please be advised that steroids can cause increased hunger, irritability, elevated blood sugars and sleeplessness especially when taken later in the day.  The symptoms and side effects should resolve after you finish the course. I am also sending in an albuterol  inhaler for you to use as needed up to every 6 hours for coughing and shortness of breath.  We have provided you with a spacer to help with medication administration. If you find you are continuing to have shortness of breath, shortness of breath and trouble breathing gets worse, you develop fevers or chills, productive cough please return here for further evaluation     ED Prescriptions      Medication Sig Dispense Auth. Provider   predniSONE  (DELTASONE ) 20 MG tablet Take 2 tablets (40 mg total) by mouth daily for 5 days. 10 tablet Korine Winton E, PA-C   albuterol  (VENTOLIN  HFA) 108 (90 Base) MCG/ACT inhaler Inhale 1-2 puffs into the lungs every 6 (six) hours as needed for wheezing or shortness of breath. 8 g Louis Gaw E, PA-C      PDMP not reviewed this encounter.   Nefertiti Mohamad, Pearla Bottom, PA-C 10/25/23 1223

## 2023-10-31 ENCOUNTER — Other Ambulatory Visit: Payer: Self-pay | Admitting: Internal Medicine

## 2023-10-31 ENCOUNTER — Other Ambulatory Visit: Payer: Self-pay

## 2023-11-05 ENCOUNTER — Ambulatory Visit (INDEPENDENT_AMBULATORY_CARE_PROVIDER_SITE_OTHER): Admitting: Internal Medicine

## 2023-11-05 VITALS — BP 148/80 | HR 57 | Temp 98.1°F | Ht 67.0 in | Wt 208.0 lb

## 2023-11-05 DIAGNOSIS — I10 Essential (primary) hypertension: Secondary | ICD-10-CM

## 2023-11-05 DIAGNOSIS — R739 Hyperglycemia, unspecified: Secondary | ICD-10-CM | POA: Diagnosis not present

## 2023-11-05 DIAGNOSIS — N529 Male erectile dysfunction, unspecified: Secondary | ICD-10-CM

## 2023-11-05 DIAGNOSIS — J4530 Mild persistent asthma, uncomplicated: Secondary | ICD-10-CM

## 2023-11-05 MED ORDER — SILDENAFIL CITRATE 20 MG PO TABS: ORAL_TABLET | ORAL | 5 refills | Status: AC

## 2023-11-05 MED ORDER — BENAZEPRIL HCL 40 MG PO TABS: 20.0000 mg | ORAL_TABLET | Freq: Every day | ORAL | Status: DC

## 2023-11-05 NOTE — Assessment & Plan Note (Signed)
 Ok for change to revatio  20 mg - 3 every day prn

## 2023-11-05 NOTE — Progress Notes (Signed)
 Patient ID: [name] [name], male   DOB: June 27, 1950, 73 y.o.   MRN: [id number]        Chief Complaint: follow up recent UC visit June 19 with bronchitis, ED, htn       HPI:  [name] [name] is a 73 y.o. male here with c/o recent tx for asthmatic bronchitis tx with antibx and prednisone , now near resolved except has feelling of chest heaviness to trying to take deep breaths.  Pt denies chest pain, increased sob or doe, wheezing, orthopnea, PND, increased LE swelling, palpitations, or syncope but has intermittent daily weakness and dizziness after taking all BP meds in the aM. SABRA   Pt denies polydipsia, polyuria, or new focal neuro s/s.   Also asks for ED med change as cialis  not working well.  Has July 15 appt already scheduled with cardiology Wt Readings from Last 3 Encounters:  11/05/23 208 lb (94.3 kg)  10/25/23 200 lb (90.7 kg)  10/12/23 200 lb (90.7 kg)   BP Readings from Last 3 Encounters:  11/05/23 (!) 148/80  10/25/23 (!) 173/78  08/06/23 (!) 160/69         Past Medical History:  Diagnosis Date   BENIGN PROSTATIC HYPERTROPHY 05/06/2010   COMMON MIGRAINE 07/31/2007   GERD 07/31/2007   HYPERLIPIDEMIA 01/04/2007   HYPERTENSION 01/04/2007   OVERACTIVE BLADDER 03/06/2007   Prostate cancer (HCC) 02/04/2015   Past Surgical History:  Procedure Laterality Date   BIOPSY  12/11/2021   Procedure: BIOPSY;  Surgeon: Charlanne Groom, MD;  [facility] ENDOSCOPY;  Service: Gastroenterology;;   COLONOSCOPY  2009   ESOPHAGOGASTRODUODENOSCOPY (EGD) WITH PROPOFOL  N/A 12/11/2021   Procedure: ESOPHAGOGASTRODUODENOSCOPY (EGD) WITH PROPOFOL ;  Surgeon: Charlanne Groom, MD;  Location: [facility] ENDOSCOPY;  Service: Gastroenterology;  Laterality: N/A;    reports that he has never smoked. He has never used smokeless tobacco. He reports that he does not drink alcohol and does not use drugs. family history includes Heart disease (age of onset: 77) in his brother; Lung cancer (age of onset: 42) in his father. Allergies   Allergen Reactions   Beta Adrenergic Blockers Other (See Comments)    bradycardia   Celebrex [Celecoxib] Other (See Comments)    Stroke like symptoms   Crestor  [Rosuvastatin ] Other (See Comments)    weakness   Detrol  [Tolterodine ] Other (See Comments)    headache   Ditropan  [Oxybutynin ] Other (See Comments)    Dry mouth   Hydralazine  Other (See Comments)    Pt seems abnormally sensitive to med.  Got just 50mg  PO, dropped SBP from 180 to 73 with paradoxical sinus bradycardia in low 40s. If going to try to use in future, consider dramatically reduced dose.   Rapaflo  [Silodosin ] Other (See Comments)    Lwo blood pressures   Tadalafil  Other (See Comments)    headache   Tamsulosin  Other (See Comments)    Low blood pressure drops with taking   Toprol  Xl [Metoprolol ] Other (See Comments)    dizzy   Current Outpatient Medications on File Prior to Visit  Medication Sig Dispense Refill   albuterol  (VENTOLIN  HFA) 108 (90 Base) MCG/ACT inhaler Inhale 1-2 puffs into the lungs every 6 (six) hours as needed for wheezing or shortness of breath. 8 g 0   alfuzosin (UROXATRAL) 10 MG 24 hr tablet Take 10 mg by mouth every other day.     aspirin  81 MG tablet Take 1 tablet (81 mg total) by mouth daily.     atorvastatin  (LIPITOR) 40 MG  tablet TAKE 1 TABLET BY MOUTH EVERY DAY 90 tablet 3   Cholecalciferol  50 MCG (2000 UT) TABS 1 tab by mouth once daily (Patient taking differently: Take 2,000 Units by mouth daily.) 30 tablet 99   diltiazem  (CARDIZEM  CD) 120 MG 24 hr capsule TAKE 1 CAPSULE BY MOUTH EVERY DAY 90 capsule 1   dorzolamide -timolol  (COSOPT ) 2-0.5 % ophthalmic solution INSTILL 1 DROP INTO BOTH EYES TWICE A DAY 10 mL 0   hydrochlorothiazide  (HYDRODIURIL ) 25 MG tablet Take 25 mg by mouth daily.     ketorolac  (ACULAR ) 0.5 % ophthalmic solution Place 1 drop into the left eye 4 (four) times daily. 5 mL 0   latanoprost  (XALATAN ) 0.005 % ophthalmic solution Place 1 drop into both eyes at bedtime.  1    pantoprazole  (PROTONIX ) 40 MG tablet Take 1 tablet (40 mg total) by mouth daily. (Patient not taking: Reported on 10/12/2023) 90 tablet 3   polyethylene glycol (MIRALAX / GLYCOLAX) packet Take 17 g by mouth as needed for mild constipation.     potassium chloride  (KLOR-CON ) 10 MEQ tablet TAKE 1 TABLET BY MOUTH EVERY DAY 90 tablet 3   SHINGRIX injection      solifenacin  (VESICARE ) 5 MG tablet TAKE 1 TABLET (5 MG TOTAL) BY MOUTH DAILY. 90 tablet 3   spironolactone  (ALDACTONE ) 25 MG tablet Take 0.5 tablets (12.5 mg total) by mouth daily. 45 tablet 3   SYSTANE ULTRA 0.4-0.3 % SOLN Place 1 drop into both eyes daily as needed (dryness).     No current facility-administered medications on file prior to visit.        ROS:  All others reviewed and negative.  Objective        PE:  BP (!) 148/80 (BP Location: Right Arm, Patient Position: Sitting, Cuff Size: Normal)   Pulse (!) 57   Temp 98.1 F (36.7 C) (Oral)   Ht 5' 7 (1.702 m)   Wt 208 lb (94.3 kg)   SpO2 98%   BMI 32.58 kg/m                 Constitutional: Pt appears in NAD               HENT: Head: NCAT.                Right Ear: External ear normal.                 Left Ear: External ear normal.                Eyes: . Pupils are equal, round, and reactive to light. Conjunctivae and EOM are normal               Nose: without d/c or deformity               Neck: Neck supple. Gross normal ROM               Cardiovascular: Normal rate and regular rhythm.                 Pulmonary/Chest: Effort normal and breath sounds without rales or wheezing.                Abd:  Soft, NT, ND, + BS, no organomegaly               Neurological: Pt is alert. At baseline orientation, motor grossly intact  Skin: Skin is warm. No rashes, no other new lesions, LE edema - none               Psychiatric: Pt behavior is normal without agitation   Micro: none  Cardiac tracings I have personally interpreted today:  none  Pertinent Radiological  findings (summarize): none   Lab Results  Component Value Date   WBC 6.5 05/29/2023   HGB 15.1 05/29/2023   HCT 45.4 05/29/2023   PLT 284.0 05/29/2023   GLUCOSE 93 05/29/2023   CHOL 229 (H) 05/29/2023   TRIG 87.0 05/29/2023   HDL 58.40 05/29/2023   LDLDIRECT 139.2 10/29/2012   LDLCALC 153 (H) 05/29/2023   ALT 16 05/29/2023   AST 21 05/29/2023   NA 139 05/29/2023   K 4.1 05/29/2023   CL 103 05/29/2023   CREATININE 1.19 05/29/2023   BUN 13 05/29/2023   CO2 28 05/29/2023   TSH 1.42 05/29/2023   PSA 0.22 07/20/2021   INR 1.0 12/08/2021   HGBA1C 6.2 05/29/2023   Assessment/Plan:  [name] [name] is a 73 y.o. Black or African American [2] male with  has a past medical history of BENIGN PROSTATIC HYPERTROPHY (05/06/2010), COMMON MIGRAINE (07/31/2007), GERD (07/31/2007), HYPERLIPIDEMIA (01/04/2007), HYPERTENSION (01/04/2007), OVERACTIVE BLADDER (03/06/2007), and Prostate cancer (HCC) (02/04/2015).  Primary hypertension BP Readings from Last 3 Encounters:  11/05/23 (!) 148/80  10/25/23 (!) 173/78  08/06/23 (!) 160/69   Uncontrolled today but likely overcontrolled it seems per pt on current meds, pt to continue medical treatment Cardizem  CD 120 every day, hct 25 every day, but decreased benazepril  to 20 mg every day and continue to monitor, and f/u cardiology July 15 as planned   Hyperglycemia Lab Results  Component Value Date   HGBA1C 6.2 05/29/2023   Stable, pt to continue current medical treatment - diet, wt control   Asthma Resolved exacerbation,  to f/u any worsening symptoms or concerns  Erectile dysfunction Ok for change to revatio  20 mg - 3 every day prn  Followup: Return if symptoms worsen or fail to improve.  Lynwood Rush, MD 11/05/2023 7:15 PM [facility] Internal Medicine

## 2023-11-05 NOTE — Assessment & Plan Note (Signed)
 Lab Results  Component Value Date   HGBA1C 6.2 05/29/2023   Stable, pt to continue current medical treatment  - diet, wt control

## 2023-11-05 NOTE — Assessment & Plan Note (Addendum)
 BP Readings from Last 3 Encounters:  11/05/23 (!) 148/80  10/25/23 (!) 173/78  08/06/23 (!) 160/69   Uncontrolled today but likely overcontrolled it seems per pt on current meds, pt to continue medical treatment Cardizem  CD 120 every day, hct 25 every day, but decreased benazepril  to 20 mg every day and continue to monitor, and f/u cardiology July 15 as planned

## 2023-11-05 NOTE — Assessment & Plan Note (Signed)
 Resolved exacerbation,  to f/u any worsening symptoms or concerns

## 2023-11-05 NOTE — Patient Instructions (Signed)
 Ok to take HALF of the benazepril  40 mg - so you would only take 20 mg per day  Please take all new medication as prescribed - the sildenfil as needed  Please continue all other medications as before, and refills have been done if requested.  Please have the pharmacy call with any other refills you may need.  Please keep your appointments with your specialists as you may have planned - Cardiology July 15

## 2023-11-06 ENCOUNTER — Telehealth: Payer: Self-pay

## 2023-11-06 ENCOUNTER — Other Ambulatory Visit (HOSPITAL_COMMUNITY): Payer: Self-pay

## 2023-11-06 NOTE — Telephone Encounter (Signed)
 PLEASE BE ADVISED Clinical questions have been answered and PA submitted.TO PLAN. PA currently Pending.

## 2023-11-06 NOTE — Telephone Encounter (Signed)
 Pharmacy Patient Advocate Encounter   Received notification from CoverMyMeds that prior authorization for Sildenafil  Citrate (PAH) 20MG  tablets is required/requested.   Insurance verification completed.   The patient is insured through [facility] .   Per test claim: PA required; PA submitted to above mentioned insurance via CoverMyMeds Key/confirmation #/EOC [id number] Status is pending

## 2023-11-07 NOTE — Telephone Encounter (Signed)
 Pharmacy Patient Advocate Encounter  Received notification from [facility] that Prior Authorization for Sildenafil  Citrate (PAH) 20MG  tablets  has been DENIED.  See denial reason below. No denial letter attached in CMM. Will attach denial letter to Media tab once received. DENIED. This drug used for the treatment of sexual dysfunction is not a covered benefit under the Part D coverage and we do not offer supplemental benefit. Section 1927(d)(2) of the Social Security Act (the Act) permits the exclusion of certain drugs or classes of drugs from coverage under Part D. PA #/Case ID/Reference #: : [id number]  PLEASE BE ADVISE PT MAY BE ABLE TO USE A GOOD RX $18.37 WITH ([facility] WAS THE CHEAPEST)  [id number] PCN: [id number] GROUP: [id number] MEMBER ID: [id number]

## 2023-11-20 ENCOUNTER — Encounter (HOSPITAL_BASED_OUTPATIENT_CLINIC_OR_DEPARTMENT_OTHER): Payer: Self-pay | Admitting: Cardiology

## 2023-11-20 ENCOUNTER — Ambulatory Visit (INDEPENDENT_AMBULATORY_CARE_PROVIDER_SITE_OTHER): Admitting: Cardiology

## 2023-11-20 VITALS — BP 154/72 | HR 48 | Ht 67.0 in | Wt 208.0 lb

## 2023-11-20 DIAGNOSIS — R0989 Other specified symptoms and signs involving the circulatory and respiratory systems: Secondary | ICD-10-CM

## 2023-11-20 DIAGNOSIS — I479 Paroxysmal tachycardia, unspecified: Secondary | ICD-10-CM

## 2023-11-20 DIAGNOSIS — E7800 Pure hypercholesterolemia, unspecified: Secondary | ICD-10-CM | POA: Diagnosis not present

## 2023-11-20 DIAGNOSIS — R002 Palpitations: Secondary | ICD-10-CM

## 2023-11-20 DIAGNOSIS — I10 Essential (primary) hypertension: Secondary | ICD-10-CM

## 2023-11-20 NOTE — Patient Instructions (Addendum)
 Medication Instructions:   Your physician recommends that you continue on your current medications as directed. Please refer to the Current Medication list given to you today.   *If you need a refill on your cardiac medications before your next appointment, please call your pharmacy*  Lab Work:  None ordered.  If you have labs (blood work) drawn today and your tests are completely normal, you will receive your results only by: MyChart Message (if you have MyChart) OR A paper copy in the mail If you have any lab test that is abnormal or we need to change your treatment, we will call you to review the results.  Testing/Procedures:  None ordered.   Follow-Up: At [facility], you and your health needs are our priority.  As part of our continuing mission to provide you with exceptional heart care, our providers are all part of one team.  This team includes your primary Cardiologist (physician) and Advanced Practice Providers or APPs (Physician Assistants and Nurse Practitioners) who all work together to provide you with the care you need, when you need it.  Your next appointment:   4 week(s)  Provider:   Rosaline Bane, NP    We recommend signing up for the patient portal called MyChart.  Sign up information is provided on this After Visit Summary.  MyChart is used to connect with patients for Virtual Visits (Telemedicine).  Patients are able to view lab/test results, encounter notes, upcoming appointments, etc.  Non-urgent messages can be sent to your provider as well.   To learn more about what you can do with MyChart, go to [email or web address].   Other Instructions    Try taking the 20 mg benazepril  in the evening, and the rest of the medications in the morning. See if that helps prevent some of the low blood pressures. Keep a log and we will review it at your follow up.

## 2023-11-20 NOTE — Progress Notes (Signed)
 Cardiology Office Note:  .   Date:  11/20/2023  ID:  [name] [name], DOB 1950/06/20, MRN [id number] PCP: Norleen Lynwood ORN, MD  [facility] Providers Cardiologist:  Shelda Bruckner, MD {  History of Present Illness: .   [name] [name] is a 73 y.o. male with a hx of SVT, hypertension, hyperlipidemia, GERD, BPH, and prostate cancer, who is seen for follow up today. I initially met him 05/17/22 as a new consult at the request of Norleen Lynwood ORN, MD for the evaluation and management of tachycardia.    FH: brother died of MI in his 26s. Mother had CHF.   BP history: Losartan  didn't help much.   Today: Blood pressure has been labile; in the last few days, highest has been 153/86 but has several readings of 100/60s. These numbers happen both when he is and isn't doing activity. Has been happening over the last few weeks. Has been dealing with bronchitis the last few weeks, but has been eating and drinking normally. Dr. Norleen told him to decrease the benazepril  from 40 mg to 20 mg daily. No syncope; has a general feeling of weakness when his BP is low. He tries to drink fluids and add a little salt to his water, which helps. He stopped benezapril for about 4 days and was still having intermittent low blood pressure.  Hasn't taken benazepril  or spironolactone  yet this AM.   Diltiazem  controlling palpitations well, though he notes that he feels fast HR when his BP is low.  ROS: Denies chest pain, shortness of breath at rest. No PND, orthopnea, LE edema or unexpected weight gain. No syncope or palpitations. ROS otherwise negative except as noted.   Studies Reviewed: SABRA    EKG:       Physical Exam:   VS:  BP (!) 174/87 (BP Location: Right Arm, Patient Position: Sitting, Cuff Size: Normal)   Pulse (!) 48   Ht 5' 7 (1.702 m)   Wt 208 lb (94.3 kg)   SpO2 96%   BMI 32.58 kg/m    Wt Readings from Last 3 Encounters:  11/20/23 208 lb (94.3 kg)  11/05/23 208 lb (94.3 kg)  10/25/23 200 lb  (90.7 kg)    GEN: Well nourished, well developed in no acute distress HEENT: Normal, moist mucous membranes NECK: No JVD CARDIAC: regular rhythm, normal S1 and S2, no rubs or gallops. No murmur. VASCULAR: Radial and DP pulses 2+ bilaterally. No carotid bruits RESPIRATORY:  Clear to auscultation without rales, wheezing or rhonchi  ABDOMEN: Soft, non-tender, non-distended MUSCULOSKELETAL:  Ambulates independently SKIN: Warm and dry, no edema NEUROLOGIC:  Alert and oriented x 3. No focal neuro deficits noted. PSYCHIATRIC:  Normal affect    ASSESSMENT AND PLAN: .    Intermittent SVT, palpitations:  -well controlled currently -continue diltiazem  -prior echo without significant abnormalities -prior MPI 2015, but no chest pain/anginal symptoms   Hypertension:  -has been well controlled on benazepril , hydrochlorothiazide  and spironolactone  in the past. Now with labile blood pressure, no longer on hydrochlorothiazide , has cut benazepril  and spironolactone  dose. -checks home BP, reviewed his log as above   Hypercholesterolemia: elevated ASCVD risk -LDL 153 05/29/2023, elevated from prior and no longer at goal (<100). He is unsure when his atorvastatin  dose was increased; appears this was done several months prior to his lab draw -currently on atorvastatin  40 mg daily, aspirin  81 mg daily (this is optional, but with hyperglycemia/nearing diabetes, reasonable to continue as he reports no bleeding issues) -discussed  options today, including increasing dose or changing therapy. Did not tolerate rosuvastatin  in the past. Discussed lifestyle recommendations. After shared decision making, prefers to make no changes at this time, will reassess at follow up  CV risk counseling and prevention -recommend heart healthy/Mediterranean diet, with whole grains, fruits, vegetable, fish, lean meats, nuts, and olive oil. Limit salt. -recommend moderate walking, 3-5 times/week for 30-50 minutes each session. Aim  for at least 150 minutes.week. Goal should be pace of 3 miles/hours, or walking 1.5 miles in 30 minutes -recommend avoidance of tobacco products. Avoid excess alcohol. -ASCVD risk score: The 10-year ASCVD risk score (Arnett DK, et al., 2019) is: 36%   Values used to calculate the score:     Age: 60 years     Clincally relevant sex: Male     Is Non-Hispanic African American: Yes     Diabetic: No     Tobacco smoker: No     Systolic Blood Pressure: 174 mmHg     Is BP treated: Yes     HDL Cholesterol: 58.4 mg/dL     Total Cholesterol: 229 mg/dL    Dispo: 6 mos or sooner as needed  Signed, Shelda Bruckner, MD   Shelda Bruckner, MD, PhD, [facility] [place]  [facility]  [place]  [facility] at [facility] at [facility] [address] [place], [place] [address] [phone]

## 2023-12-03 ENCOUNTER — Encounter: Payer: Self-pay | Admitting: Internal Medicine

## 2023-12-03 ENCOUNTER — Encounter (HOSPITAL_BASED_OUTPATIENT_CLINIC_OR_DEPARTMENT_OTHER): Payer: Self-pay | Admitting: Cardiology

## 2023-12-03 DIAGNOSIS — Z961 Presence of intraocular lens: Secondary | ICD-10-CM | POA: Diagnosis not present

## 2023-12-03 DIAGNOSIS — H26491 Other secondary cataract, right eye: Secondary | ICD-10-CM | POA: Diagnosis not present

## 2023-12-03 DIAGNOSIS — H35352 Cystoid macular degeneration, left eye: Secondary | ICD-10-CM | POA: Diagnosis not present

## 2023-12-03 DIAGNOSIS — H401131 Primary open-angle glaucoma, bilateral, mild stage: Secondary | ICD-10-CM | POA: Diagnosis not present

## 2023-12-03 DIAGNOSIS — H35373 Puckering of macula, bilateral: Secondary | ICD-10-CM | POA: Diagnosis not present

## 2023-12-03 NOTE — Telephone Encounter (Signed)
 Ok to hold the lotensin  40 mg per day  Cont all other meds  ROV next available

## 2023-12-03 NOTE — Telephone Encounter (Signed)
 I see now he did mention not taking any meds already.   Pt should go to ED - ? Dehydration or infection?

## 2023-12-16 NOTE — Progress Notes (Signed)
 Cardiology Office Note   Date:  12/17/2023  ID:  EZRAH PANNING, DOB 1951/01/05, MRN [id number] PCP: Norleen Lynwood ORN, MD  [facility] Providers Cardiologist:  Shelda Bruckner, MD     [facility] Hyperlipidemia Hypertension GERD Prostate cancer SVT  Referred to cardiology and seen by Dr. Bruckner 02/25/2019 for palpitations/tachycardia and labile blood pressures. Seeing HR peak in the 150s and then drops down to normal 50-60s. Has noted this at rest and with exertion. At times notes BP is a little low on occasions when HR was high. Notes systolic BP of 90 or below but did not have home BP log for review. Had an episode a few months prior, was sitting at the table, wife came to check on him and he leaned back in hs chair, does not think he lost consciousness. Reported blood sugars were well controlled. His brother died of an MI in his 9s, mother had CHF. TTE 01/23/19 revealed LVEF 60-65%, no LVH, pseudonormalization pattern of LV diastolic filling, no significant valve disease.   Seen again 05/17/2022 after > 3 year absence. Monitoring HR and BP several times per day. Tachycardia independent of high or low BP, and typically associated with dyspnea and chest tightness/fullness that makes him feel unable to take deep breaths. Episodes of tachycardia 3-4 times per day, some brief others lasting up to 10 min. One episode a few days prior lasted for hours with no alleviating factors. Drinks tea but no energy drinks or supplements. Episodes do not worsen with caffeine. At times feeling like he may pass out from low BP. Prior hx of hematochezia but no recent bleeding concerns. Vary active, enjoys being outdoors.  Seen in ER 05/31/22 for similar symptoms. Recommended to try diltiazem  as he awaited cardiac monitor results.  Monitor completed 06/22/22 revealed predominant NSR with 6409 episodes of SVT, longest lasting 39 min 15 sec; 4.7% burden of SVEs, symptomatic events correlate with SVT. He was  tolerating diltiazem  without concerning side effects.  Referred to EP and seen by Dr. Waddell on 07/06/22. He had improvement in symptoms on CCB. Treatment of SVT discussed and plan was for watchful waiting.   Last cardiology clinic visit was 11/20/23 with Dr. Bruckner. Having issues with labile BP with BP high at 153/86 with activity and 100/60s at rest. PCP advised him to reduce benazepril  from 40 mg to 20 md daily. Has feeling of general weakness with lower BP but no syncope. Had been adding salt to his water. He stopped benazepril  for a few days and then returned to lower dose benazepril  and lowered his dose of spironolactone . He stopped hydrochlorothiazide . Palpitations were well controlled on diltiazem . He was advised to change benazepril  to 20 mg in the evenings and return in 4 weeks for f/u BP.   History of Present Illness Discussed the use of AI scribe software for clinical note transcription with the patient, who gave verbal consent to proceed.   History of Present Illness GAVIN TELFORD is a very pleasant 73 year old male who is here today for follow-up of hypertension. He has been monitoring BP and pulse consistently at home.  BP is typically elevated above 140/80 when he wakes up between 6 and 7 AM.  He typically takes his medication between 8 and 9 AM.  BP is typically well-controlled later in the day.  He has had 3 episodes of episodes of low blood pressure with HR increase to the 130s, accompanied by extreme fatigue and a sensation of near syncope.  No syncope. He experiences shortness of breath during these episodes but denies palpitations when this occurs. He self adjusted medication by holding benazepril  and spironolactone  since these episodes occurred.  He wanted to see if BP could be well-controlled without these medications. He maintains good hydration and regular eating habits, with occasional soda and only 1 cup of tea in the mornings.  During hypotensive episodes, he consumes  salty foods or drinks electrolyte beverages for relief.  He reports diet is overall healthy.   ROS: See HPI  Studies Reviewed      No results found for: LIPOA  Risk Assessment/Calculations   HYPERTENSION CONTROL Vitals:   12/17/23 1452 12/17/23 1954  BP: (!) 160/68 (!) 140/68    The patient's blood pressure is elevated above target today.  In order to address the patient's elevated BP: A current anti-hypertensive medication was adjusted today.          Physical Exam VS:  BP (!) 140/68   Pulse 65   Ht 5' 7 (1.702 m)   Wt 208 lb 8 oz (94.6 kg)   SpO2 96%   BMI 32.66 kg/m    Wt Readings from Last 3 Encounters:  12/17/23 208 lb 8 oz (94.6 kg)  11/20/23 208 lb (94.3 kg)  11/05/23 208 lb (94.3 kg)    GEN: Well nourished, well developed in no acute distress NECK: No JVD; No carotid bruits CARDIAC: RRR, no murmurs, rubs, gallops RESPIRATORY:  Clear to auscultation without rales, wheezing or rhonchi  ABDOMEN: Soft, non-tender, non-distended EXTREMITIES:  No edema; No deformity   ASSESSMENT AND PLAN  Hypertension Labile blood pressure BP initially elevated with some improvement on my recheck. Three episodes of elevated HR causing hypotension that concern him. He stopped spironolactone  and benzepril to see if BP would be stable without these medications and prevent further episodes of hypotension. These episodes occur when sitting and relaxing. Home BP typically > 140/80 when he wakes up but improves later in the day after medications. Recent cessation of benazepril  and spironolactone  slightly increased blood pressure. No problems with splitting benazepril  40 mg tablets.  -Restart benazepril  20 mg daily at night.  -Continue diltiazem  in the morning -Discontinue spironolactone  -Encourage salty snacks or electrolyte beverages during hypotensive episodes -We will get BMET today to ensure stable kidney function  -Continue to monitor home BP and pulse and send readings in 2  weeks  SVT He denies palpitations but has episodes of elevated HR in the 130s and subsequent hypotension. Epidoses occurred 3 times in the past month. This concerns him. He self discontinued benazepril  and spironolactone  to avoid future episodes of hypotension. - Recommended that he resume benazepril  20 mg daily at night -Continue diltiazem  in the mornings -Continue good hydration and adequate nutrition  Hyperlipidemia LDL goal < 70 Aortic atherosclerosis Aortic atherosclerosis noted on previous CT.  Lipid panel completed 05/29/2023 with total cholesterol 229, HDL 58, LDL 153, and triglycerides 87.  He reports compliance with atorvastatin . He denies chest pain, dyspnea, or other symptoms concerning for angina.  No indication for further ischemic evaluation at this time.  - Recheck lipid panel for surveillance -Continue aspirin         Dispo: 6 months with Dr. Lonni or APP  Signed, Rosaline Bane, NP-C

## 2023-12-17 ENCOUNTER — Encounter (HOSPITAL_BASED_OUTPATIENT_CLINIC_OR_DEPARTMENT_OTHER): Payer: Self-pay | Admitting: Nurse Practitioner

## 2023-12-17 ENCOUNTER — Ambulatory Visit (INDEPENDENT_AMBULATORY_CARE_PROVIDER_SITE_OTHER): Admitting: Nurse Practitioner

## 2023-12-17 VITALS — BP 140/68 | HR 65 | Ht 67.0 in | Wt 208.5 lb

## 2023-12-17 DIAGNOSIS — I10 Essential (primary) hypertension: Secondary | ICD-10-CM

## 2023-12-17 DIAGNOSIS — E785 Hyperlipidemia, unspecified: Secondary | ICD-10-CM | POA: Diagnosis not present

## 2023-12-17 DIAGNOSIS — I471 Supraventricular tachycardia, unspecified: Secondary | ICD-10-CM

## 2023-12-17 DIAGNOSIS — R002 Palpitations: Secondary | ICD-10-CM

## 2023-12-17 DIAGNOSIS — I479 Paroxysmal tachycardia, unspecified: Secondary | ICD-10-CM | POA: Diagnosis not present

## 2023-12-17 DIAGNOSIS — I700 Atherosclerosis of aorta: Secondary | ICD-10-CM | POA: Diagnosis not present

## 2023-12-17 DIAGNOSIS — R0989 Other specified symptoms and signs involving the circulatory and respiratory systems: Secondary | ICD-10-CM

## 2023-12-17 NOTE — Patient Instructions (Signed)
 Medication Instructions:   RESTART Benazepril  take one half (0.5)  tablet by mouth ( 20 mg) daily.   *If you need a refill on your cardiac medications before your next appointment, please call your pharmacy*  Lab Work:  TODAY!!! DIRECT LDL/BMET/LIPID  If you have labs (blood work) drawn today and your tests are completely normal, you will receive your results only by: MyChart Message (if you have MyChart) OR A paper copy in the mail If you have any lab test that is abnormal or we need to change your treatment, we will call you to review the results.  Testing/Procedures:  None ordered.  Follow-Up: At [facility], you and your health needs are our priority.  As part of our continuing mission to provide you with exceptional heart care, our providers are all part of one team.  This team includes your primary Cardiologist (physician) and Advanced Practice Providers or APPs (Physician Assistants and Nurse Practitioners) who all work together to provide you with the care you need, when you need it.  Your next appointment:   6 month(s)  Provider:   Shelda Bruckner, MD, Rosaline Bane, NP, or Reche Finder, NP    We recommend signing up for the patient portal called MyChart.  Sign up information is provided on this After Visit Summary.  MyChart is used to connect with patients for Virtual Visits (Telemedicine).  Patients are able to view lab/test results, encounter notes, upcoming appointments, etc.  Non-urgent messages can be sent to your provider as well.   To learn more about what you can do with MyChart, go to [email or web address].   Other Instructions  Your physician wants you to follow-up in: 6 months.  You will receive a reminder letter in the mail two months in advance. If you don't receive a letter, please call our office to schedule the follow-up appointment.

## 2023-12-18 ENCOUNTER — Ambulatory Visit: Payer: Self-pay | Admitting: Nurse Practitioner

## 2023-12-18 DIAGNOSIS — Z79899 Other long term (current) drug therapy: Secondary | ICD-10-CM

## 2023-12-18 DIAGNOSIS — R748 Abnormal levels of other serum enzymes: Secondary | ICD-10-CM

## 2023-12-18 LAB — BASIC METABOLIC PANEL WITH GFR
BUN/Creatinine Ratio: 14 (ref 10–24)
BUN: 20 mg/dL (ref 8–27)
CO2: 20 mmol/L (ref 20–29)
Calcium: 9.7 mg/dL (ref 8.6–10.2)
Chloride: 102 mmol/L (ref 96–106)
Creatinine, Ser: 1.45 mg/dL — ABNORMAL HIGH (ref 0.76–1.27)
Glucose: 82 mg/dL (ref 70–99)
Potassium: 4.4 mmol/L (ref 3.5–5.2)
Sodium: 139 mmol/L (ref 134–144)
eGFR: 50 mL/min/1.73 — ABNORMAL LOW (ref >59)

## 2023-12-18 LAB — LIPID PANEL
Chol/HDL Ratio: 2.8 ratio (ref 0.0–5.0)
Cholesterol, Total: 165 mg/dL (ref 100–199)
HDL: 58 mg/dL (ref >39)
LDL Chol Calc (NIH): 87 mg/dL (ref 0–99)
Triglycerides: 109 mg/dL (ref 0–149)
VLDL Cholesterol Cal: 20 mg/dL (ref 5–40)

## 2023-12-18 LAB — LDL CHOLESTEROL, DIRECT: LDL Direct: 87 mg/dL (ref 0–99)

## 2023-12-30 ENCOUNTER — Other Ambulatory Visit: Payer: Self-pay | Admitting: Internal Medicine

## 2024-01-08 ENCOUNTER — Encounter: Payer: Self-pay | Admitting: Internal Medicine

## 2024-01-09 MED ORDER — BENAZEPRIL HCL 10 MG PO TABS: 10.0000 mg | ORAL_TABLET | Freq: Every day | ORAL | 3 refills | Status: AC

## 2024-01-15 ENCOUNTER — Encounter (HOSPITAL_BASED_OUTPATIENT_CLINIC_OR_DEPARTMENT_OTHER): Payer: Self-pay

## 2024-01-27 ENCOUNTER — Other Ambulatory Visit: Payer: Self-pay | Admitting: Internal Medicine

## 2024-01-27 ENCOUNTER — Other Ambulatory Visit (HOSPITAL_BASED_OUTPATIENT_CLINIC_OR_DEPARTMENT_OTHER): Payer: Self-pay | Admitting: Cardiology

## 2024-01-27 DIAGNOSIS — I471 Supraventricular tachycardia, unspecified: Secondary | ICD-10-CM

## 2024-01-29 DIAGNOSIS — Z23 Encounter for immunization: Secondary | ICD-10-CM | POA: Diagnosis not present

## 2024-01-31 ENCOUNTER — Encounter (HOSPITAL_BASED_OUTPATIENT_CLINIC_OR_DEPARTMENT_OTHER): Payer: Self-pay | Admitting: Nurse Practitioner

## 2024-01-31 ENCOUNTER — Ambulatory Visit (HOSPITAL_BASED_OUTPATIENT_CLINIC_OR_DEPARTMENT_OTHER): Admitting: Nurse Practitioner

## 2024-01-31 VITALS — BP 150/88 | HR 64 | Resp 17 | Ht 67.0 in | Wt 210.0 lb

## 2024-01-31 DIAGNOSIS — I471 Supraventricular tachycardia, unspecified: Secondary | ICD-10-CM

## 2024-01-31 DIAGNOSIS — E785 Hyperlipidemia, unspecified: Secondary | ICD-10-CM | POA: Diagnosis not present

## 2024-01-31 DIAGNOSIS — R0989 Other specified symptoms and signs involving the circulatory and respiratory systems: Secondary | ICD-10-CM | POA: Diagnosis not present

## 2024-01-31 DIAGNOSIS — I10 Essential (primary) hypertension: Secondary | ICD-10-CM

## 2024-01-31 DIAGNOSIS — R748 Abnormal levels of other serum enzymes: Secondary | ICD-10-CM | POA: Diagnosis not present

## 2024-01-31 DIAGNOSIS — I700 Atherosclerosis of aorta: Secondary | ICD-10-CM

## 2024-01-31 DIAGNOSIS — Z79899 Other long term (current) drug therapy: Secondary | ICD-10-CM | POA: Diagnosis not present

## 2024-01-31 NOTE — Progress Notes (Unsigned)
 Cardiology Office Note   Date:  02/01/2024  ID:  [name] [name], DOB 08-Sep-1950, MRN [id number] PCP: Norleen Lynwood ORN, MD  [facility] Providers Cardiologist:  Shelda Bruckner, MD     [facility] Hyperlipidemia Hypertension GERD Prostate cancer SVT  Referred to cardiology and seen by Dr. Bruckner 02/25/2019 for palpitations/tachycardia and labile blood pressures. Seeing HR peak in the 150s and then drops down to normal 50-60s. Has noted this at rest and with exertion. At times notes BP is a little low on occasions when HR was high. Notes systolic BP of 90 or below but did not have home BP log for review. Had an episode a few months prior, was sitting at the table, wife came to check on him and he leaned back in hs chair, does not think he lost consciousness. Reported blood sugars were well controlled. His brother died of an MI in his 88s, mother had CHF. TTE 01/23/19 revealed LVEF 60-65%, no LVH, pseudonormalization pattern of LV diastolic filling, no significant valve disease.   Seen again 05/17/2022 after > 3 year absence. Monitoring HR and BP several times per day. Tachycardia independent of high or low BP, and typically associated with dyspnea and chest tightness/fullness that makes him feel unable to take deep breaths. Episodes of tachycardia 3-4 times per day, some brief others lasting up to 10 min. One episode a few days prior lasted for hours with no alleviating factors. Drinks tea but no energy drinks or supplements. Episodes do not worsen with caffeine. At times feeling like he may pass out from low BP. Prior hx of hematochezia but no recent bleeding concerns. Vary active, enjoys being outdoors.  Seen in ER 05/31/22 for similar symptoms. Recommended to try diltiazem  as he awaited cardiac monitor results.  Monitor completed 06/22/22 revealed predominant NSR with 6409 episodes of SVT, longest lasting 39 min 15 sec; 4.7% burden of SVEs, symptomatic events correlate with SVT. He was  tolerating diltiazem  without concerning side effects.  Referred to EP and seen by Dr. Waddell on 07/06/22. He had improvement in symptoms on CCB. Treatment of SVT discussed and plan was for watchful waiting.   Seen by Dr. Bruckner 11/20/23.  Having issues with labile BP with BP high at 153/86 with activity and 100/60s at rest. PCP advised him to reduce benazepril  from 40 mg to 20 mg daily. Has feeling of general weakness with lower BP but no syncope. Had been adding salt to his water. He stopped benazepril  for a few days and then returned to lower dose benazepril  and lowered his dose of spironolactone . He stopped hydrochlorothiazide . Palpitations were well controlled on diltiazem . He was advised to change benazepril  to 20 mg in the evenings and return in 4 weeks for f/u BP.   Seen by me on 12/17/23 with BP typically elevated above 140/80 when he wakes up between 6 and 7 AM.  He typically takes his medication between 8 and 9 AM with BP typically well-controlled later in the day.  3 episodes of episodes of low blood pressure with HR increase to the 130s, accompanied by extreme fatigue and a sensation of near syncope. No syncope. He experiences shortness of breath during these episodes but denies palpitations when this occurs. He self adjusted medication by holding benazepril  and spironolactone  since these episodes occurred.  He wanted to see if BP could be well-controlled without these medications. He maintains good hydration and regular eating habits, with occasional soda and only 1 cup of tea in the mornings.  During hypotensive episodes, he consumes salty foods or drinks electrolyte beverages for relief.  He reports diet is overall healthy. BMET at visit revealed Scr 1.45 and spironolactone  was discontinued.  He was advised to ensure good hydration and avoid dark soda and nephrotoxic over-the-counter pain relievers.  He called our office 01/15/2024 to report feeling poorly on diltiazem .  BP and heart rate were  fluctuating and he was subsequently scheduled for office visit.  History of Present Illness  Discussed the use of AI scribe software for clinical note transcription with the patient, who gave verbal consent to proceed.  History of Present Illness [name] [name] is a very pleasant 73 year old male who is here today for follow-up of hypertension. He experiences fluctuations in blood pressure, with most home readings above goal of < 140/80. He notes occasional drops, particularly during outdoor activities like gardening, even when well-hydrated. When his BP drops, his HR is typically elevated. He is currently taking benazepril  10 mg daily. Previously, he was on spironolactone  and diltiazem  120 mg, which were discontinued due to adverse effects, including low blood pressure and feeling unwell. Diltiazem  was stopped around January 15, 2024. Amlodipine  was effective in the past but was discontinued due to palpitations when blood pressure dropped. He prefers his blood pressure to be between 130 and 140 mmHg, as he feels better at these levels. His recent in-office blood pressure was 152/78 mmHg. He experiences palpitations only when his blood pressure drops. There is no chest pain, shortness of breath, swelling, or orthopnea.   ROS: See HPI  Studies Reviewed      No results found for: LIPOA  Risk Assessment/Calculations   HYPERTENSION CONTROL Vitals:   01/31/24 1425 01/31/24 1627  BP: (!) 150/88 (!) 150/78    The patient's blood pressure is elevated above target today.  In order to address the patient's elevated BP: A new medication was prescribed today.          Physical Exam VS:  BP (!) 150/78   Pulse 64   Resp 17   Ht 5' 7 (1.702 m)   Wt 210 lb (95.3 kg)   SpO2 97%   BMI 32.89 kg/m    Wt Readings from Last 3 Encounters:  01/31/24 210 lb (95.3 kg)  12/17/23 208 lb 8 oz (94.6 kg)  11/20/23 208 lb (94.3 kg)    GEN: Well nourished, well developed in no acute distress NECK:  No JVD; No carotid bruits CARDIAC: RRR, no murmurs, rubs, gallops RESPIRATORY:  Clear to auscultation without rales, wheezing or rhonchi  ABDOMEN: Soft, non-tender, non-distended EXTREMITIES:  No edema; No deformity   ASSESSMENT AND PLAN  Hypertension Labile blood pressure BP elevated in clinic today.  Since discontinuing diltiazem  and spironolactone , home BP readings have mostly been > 140/80.  He notes occasional hypotension, particularly after working outside even when he is well-hydrated. He would like to see his systolic BP between 130-140 mmHg. He follows a low sodium diet.  -Continue benazepril  10 mg daily at night.  -Check renal function today to ensure stability -Encourage salty snacks or electrolyte beverages during hypotensive episodes - We will add amlodipine  5 mg once daily - Advised him to take antihypertensive therapies, 1 in the morning and 1 in the evening - Return in 1 month for blood pressure check  SVT He denies palpitations but has episodes of elevated HR in the 130s with subsequent hypotension.  No presyncope or syncope.  As noted above, BP has been above goal  since he discontinued diltiazem  and spironolactone .  He does not want to resume diltiazem . -Antihypertensive therapy as outlined above -Continue good hydration and adequate nutrition  Hyperlipidemia LDL goal < 70 Aortic atherosclerosis Aortic atherosclerosis noted on previous CT. Lipid panel completed 12/17/2023 with total cholesterol 165, triglycerides 109, HDL 58, LDL-C 87.  He reports compliance with atorvastatin . He denies chest pain, dyspnea, or other symptoms concerning for angina.  No indication for further ischemic evaluation at this time.  - Continue atorvastatin  -Consider additional lipid-lowering therapy at next office visit -Continue aspirin         Dispo: 1 month nurse visit  Signed, Rosaline Bane, NP-C

## 2024-01-31 NOTE — Patient Instructions (Signed)
 Medication Instructions:   Your physician recommends that you continue on your current medications as directed. Please refer to the Current Medication list given to you today.  *If you need a refill on your cardiac medications before your next appointment, please call your pharmacy*    Follow-Up:  1.)  ONE MONTH NURSE VISIT FOR BLOOD PRESSURE CHECK  2.)  FOLLOW-UP WILL BE DETERMINED BASED ON YOUR NURSE VISIT/BP CHECK RESULTS

## 2024-02-01 LAB — BASIC METABOLIC PANEL WITH GFR
BUN/Creatinine Ratio: 11 (ref 10–24)
BUN: 15 mg/dL (ref 8–27)
CO2: 24 mmol/L (ref 20–29)
Calcium: 9.7 mg/dL (ref 8.6–10.2)
Chloride: 101 mmol/L (ref 96–106)
Creatinine, Ser: 1.33 mg/dL — ABNORMAL HIGH (ref 0.76–1.27)
Glucose: 75 mg/dL (ref 70–99)
Potassium: 4.3 mmol/L (ref 3.5–5.2)
Sodium: 140 mmol/L (ref 134–144)
eGFR: 56 mL/min/1.73 — ABNORMAL LOW (ref >59)

## 2024-02-01 MED ORDER — AMLODIPINE BESYLATE 5 MG PO TABS
5.0000 mg | ORAL_TABLET | Freq: Every day | ORAL | 1 refills | Status: DC
Start: 2024-02-01 — End: 2024-02-25

## 2024-02-01 NOTE — Addendum Note (Signed)
 Addended by: FREDIRICK NEWELL NOVAK on: 02/01/2024 05:38 PM   Modules accepted: Orders

## 2024-02-25 ENCOUNTER — Ambulatory Visit (INDEPENDENT_AMBULATORY_CARE_PROVIDER_SITE_OTHER): Admitting: *Deleted

## 2024-02-25 VITALS — BP 160/82 | Ht 67.0 in | Wt 200.4 lb

## 2024-02-25 DIAGNOSIS — I10 Essential (primary) hypertension: Secondary | ICD-10-CM

## 2024-02-25 MED ORDER — AMLODIPINE BESYLATE 5 MG PO TABS: 5.0000 mg | ORAL_TABLET | Freq: Two times a day (BID) | ORAL | 1 refills | Status: AC

## 2024-02-25 NOTE — Progress Notes (Signed)
   Nurse Visit   Date of Encounter: 02/25/2024 ID: [name] [name], DOB 1950-07-15, MRN [id number]  PCP:  Norleen Lynwood ORN, MD   [facility] Providers Cardiologist:  Shelda Bruckner, MD      Visit Details   VS:  Ht 5' 7 (1.702 m)   Wt 200 lb 6.4 oz (90.9 kg)   BMI 31.39 kg/m  , BMI Body mass index is 31.39 kg/m.  Wt Readings from Last 3 Encounters:  02/25/24 200 lb 6.4 oz (90.9 kg)  01/31/24 210 lb (95.3 kg)  12/17/23 208 lb 8 oz (94.6 kg)     Reason for visit: blood pressure check   Performed today: Blood pressure 160/82, weight 200.4 lb, height 5 ft 7 in Changes (medications, testing, etc.) : Dr Bruckner reviewed home blood pressure readings and will increase Amlodipine  to 5 mg twice a day, follow up in 6 weeks. Reviewed recommendations with patient, verbalized understanding  Length of Visit: 15 minutes    Medications Adjustments/Labs and Tests Ordered: No orders of the defined types were placed in this encounter.  No orders of the defined types were placed in this encounter.    Bonney Newell Birk, LPN  89/79/7974 1:08 PM

## 2024-02-25 NOTE — Patient Instructions (Addendum)
 INCREASE YOUR AMLODIPINE  TO 5 MG TWICE A DAY (MORNING AND EVENING)  MONITOR YOU BLOOD PRESSURE DAILY AT HOME, BRING READINGS AND MACHINE TO FOLLOW UP   Follow up 04/09/2024 11:40 am with Dr Lonni

## 2024-03-03 ENCOUNTER — Encounter (HOSPITAL_BASED_OUTPATIENT_CLINIC_OR_DEPARTMENT_OTHER): Payer: Self-pay

## 2024-03-03 DIAGNOSIS — C61 Malignant neoplasm of prostate: Secondary | ICD-10-CM | POA: Diagnosis not present

## 2024-03-03 MED ORDER — METOPROLOL TARTRATE 25 MG PO TABS: 25.0000 mg | ORAL_TABLET | Freq: Two times a day (BID) | ORAL | 2 refills | Status: DC | PRN

## 2024-03-10 DIAGNOSIS — N401 Enlarged prostate with lower urinary tract symptoms: Secondary | ICD-10-CM | POA: Diagnosis not present

## 2024-03-10 DIAGNOSIS — C61 Malignant neoplasm of prostate: Secondary | ICD-10-CM | POA: Diagnosis not present

## 2024-03-10 DIAGNOSIS — R3915 Urgency of urination: Secondary | ICD-10-CM | POA: Diagnosis not present

## 2024-03-24 ENCOUNTER — Encounter: Payer: Self-pay | Admitting: Internal Medicine

## 2024-03-24 ENCOUNTER — Ambulatory Visit: Admitting: Internal Medicine

## 2024-03-24 VITALS — BP 142/84 | HR 52 | Temp 97.9°F | Ht 67.0 in | Wt 207.1 lb

## 2024-03-24 DIAGNOSIS — M545 Low back pain, unspecified: Secondary | ICD-10-CM | POA: Diagnosis not present

## 2024-03-24 DIAGNOSIS — I10 Essential (primary) hypertension: Secondary | ICD-10-CM

## 2024-03-24 DIAGNOSIS — R739 Hyperglycemia, unspecified: Secondary | ICD-10-CM | POA: Diagnosis not present

## 2024-03-24 DIAGNOSIS — N3281 Overactive bladder: Secondary | ICD-10-CM | POA: Diagnosis not present

## 2024-03-24 DIAGNOSIS — E559 Vitamin D deficiency, unspecified: Secondary | ICD-10-CM | POA: Diagnosis not present

## 2024-03-24 MED ORDER — SOLIFENACIN SUCCINATE 10 MG PO TABS
10.0000 mg | ORAL_TABLET | Freq: Every day | ORAL | 3 refills | Status: AC
Start: 2024-03-24 — End: ?

## 2024-03-24 NOTE — Assessment & Plan Note (Signed)
 Last vitamin D Lab Results  Component Value Date   VD25OH 45.37 10/17/2021   Stable, cont oral replacement

## 2024-03-24 NOTE — Progress Notes (Signed)
 Patient ID: [name] [name], male   DOB: Sep 10, 1950, 73 y.o.   MRN: [id number]        Chief Complaint: follow up htn, low back pain, OAB, low vit d       HPI:  [name] [name] is a 73 y.o. male here with c/o 1 month persistent now constant mild pain across the lower back, without radiation or LE symptoms.  No gait change or falls.  BP has been controlled at home per pt.  LS spine films nov 2024 c/w lumbar djd and DDD; pain worse to stand up or bend.  Pt denies chest pain, increased sob or doe, wheezing, orthopnea, PND, increased LE swelling, palpitations, dizziness or syncope.   Pt denies polydipsia, polyuria, or new focal neuro s/s.   Denies urinary symptoms such as dysuria, urgency, flank pain, hematuria or n/v, fever, chills, but has increased urinary frequency with up to 7 times nocturia sometimes.  Taking alpha blocker and vesicare  5 mg seemd to help to start, but no longer working as well.        Wt Readings from Last 3 Encounters:  03/24/24 207 lb 2 oz (94 kg)  02/25/24 200 lb 6.4 oz (90.9 kg)  01/31/24 210 lb (95.3 kg)   BP Readings from Last 3 Encounters:  03/24/24 (!) 142/84  02/25/24 (!) 160/82  01/31/24 (!) 150/78         Past Medical History:  Diagnosis Date   BENIGN PROSTATIC HYPERTROPHY 05/06/2010   COMMON MIGRAINE 07/31/2007   GERD 07/31/2007   HYPERLIPIDEMIA 01/04/2007   HYPERTENSION 01/04/2007   OVERACTIVE BLADDER 03/06/2007   Prostate cancer (HCC) 02/04/2015   Past Surgical History:  Procedure Laterality Date   BIOPSY  12/11/2021   Procedure: BIOPSY;  Surgeon: Charlanne Groom, MD;  Location: [facility] ENDOSCOPY;  Service: Gastroenterology;;   COLONOSCOPY  2009   ESOPHAGOGASTRODUODENOSCOPY (EGD) WITH PROPOFOL  N/A 12/11/2021   Procedure: ESOPHAGOGASTRODUODENOSCOPY (EGD) WITH PROPOFOL ;  Surgeon: Charlanne Groom, MD;  Location: [facility] ENDOSCOPY;  Service: Gastroenterology;  Laterality: N/A;    reports that he has never smoked. He has never used smokeless tobacco. He reports that he does  not drink alcohol and does not use drugs. family history includes Heart disease (age of onset: 66) in his brother; Lung cancer (age of onset: 24) in his father. Allergies  Allergen Reactions   Beta Adrenergic Blockers Other (See Comments)    bradycardia   Celebrex [Celecoxib] Other (See Comments)    Stroke like symptoms   Crestor  [Rosuvastatin ] Other (See Comments)    weakness   Detrol  [Tolterodine ] Other (See Comments)    headache   Ditropan  [Oxybutynin ] Other (See Comments)    Dry mouth   Hydralazine  Other (See Comments)    Pt seems abnormally sensitive to med.  Got just 50mg  PO, dropped SBP from 180 to 73 with paradoxical sinus bradycardia in low 40s. If going to try to use in future, consider dramatically reduced dose.   Rapaflo  [Silodosin ] Other (See Comments)    Lwo blood pressures   Tadalafil  Other (See Comments)    headache   Tamsulosin  Other (See Comments)    Low blood pressure drops with taking   Toprol  Xl [Metoprolol ] Other (See Comments)    dizzy   Current Outpatient Medications on File Prior to Visit  Medication Sig Dispense Refill   alfuzosin (UROXATRAL) 10 MG 24 hr tablet Take 10 mg by mouth every other day.     amLODipine  (NORVASC ) 5 MG tablet Take 1 tablet (  5 mg total) by mouth in the morning and at bedtime. 180 tablet 1   aspirin  81 MG tablet Take 1 tablet (81 mg total) by mouth daily.     atorvastatin  (LIPITOR) 40 MG tablet TAKE 1 TABLET BY MOUTH EVERY DAY 90 tablet 3   benazepril  (LOTENSIN ) 10 MG tablet Take 1 tablet (10 mg total) by mouth daily. 90 tablet 3   Cholecalciferol  50 MCG (2000 UT) TABS 1 tab by mouth once daily 30 tablet 99   dorzolamide -timolol  (COSOPT ) 2-0.5 % ophthalmic solution INSTILL 1 DROP INTO BOTH EYES TWICE A DAY 10 mL 0   ketorolac  (ACULAR ) 0.5 % ophthalmic solution Place 1 drop into the left eye 4 (four) times daily. 5 mL 0   latanoprost  (XALATAN ) 0.005 % ophthalmic solution Place 1 drop into both eyes at bedtime.  1   metoprolol   tartrate (LOPRESSOR ) 25 MG tablet Take 1 tablet (25 mg total) by mouth 2 (two) times daily as needed (palpitations). 60 tablet 2   polyethylene glycol (MIRALAX / GLYCOLAX) packet Take 17 g by mouth as needed for mild constipation.     potassium chloride  (KLOR-CON ) 10 MEQ tablet TAKE 1 TABLET BY MOUTH EVERY DAY 90 tablet 3   SHINGRIX injection      sildenafil  (REVATIO ) 20 MG tablet 3 tabs by mouth once daily as needed 60 tablet 5   SYSTANE ULTRA 0.4-0.3 % SOLN Place 1 drop into both eyes daily as needed (dryness).     No current facility-administered medications on file prior to visit.        ROS:  All others reviewed and negative.  Objective        PE:  BP (!) 142/84   Pulse (!) 52   Temp 97.9 F (36.6 C) (Temporal)   Ht 5' 7 (1.702 m)   Wt 207 lb 2 oz (94 kg)   SpO2 97%   BMI 32.44 kg/m                 Constitutional: Pt appears in NAD               HENT: Head: NCAT.                Right Ear: External ear normal.                 Left Ear: External ear normal.                Eyes: . Pupils are equal, round, and reactive to light. Conjunctivae and EOM are normal               Nose: without d/c or deformity               Neck: Neck supple. Gross normal ROM               Cardiovascular: Normal rate and regular rhythm.                 Pulmonary/Chest: Effort normal and breath sounds without rales or wheezing.                Abd:  Soft, NT, ND, + BS, no organomegaly               Neurological: Pt is alert. At baseline orientation, motor grossly intact; spine nontender in midline or upper buttocks               Skin: Skin is warm. No rashes, no  other new lesions, LE edema - none               Psychiatric: Pt behavior is normal without agitation   Micro: none  Cardiac tracings I have personally interpreted today:  none  Pertinent Radiological findings (summarize): none   Lab Results  Component Value Date   WBC 6.5 05/29/2023   HGB 15.1 05/29/2023   HCT 45.4 05/29/2023   PLT  284.0 05/29/2023   GLUCOSE 75 01/31/2024   CHOL 165 12/17/2023   TRIG 109 12/17/2023   HDL 58 12/17/2023   LDLDIRECT 87 12/17/2023   LDLCALC 87 12/17/2023   ALT 16 05/29/2023   AST 21 05/29/2023   NA 140 01/31/2024   K 4.3 01/31/2024   CL 101 01/31/2024   CREATININE 1.33 (H) 01/31/2024   BUN 15 01/31/2024   CO2 24 01/31/2024   TSH 1.42 05/29/2023   PSA 0.22 07/20/2021   INR 1.0 12/08/2021   HGBA1C 6.2 05/29/2023   Assessment/Plan:  DELAND SLOCUMB is a 73 y.o. Black or African American [2] male with  has a past medical history of BENIGN PROSTATIC HYPERTROPHY (05/06/2010), COMMON MIGRAINE (07/31/2007), GERD (07/31/2007), HYPERLIPIDEMIA (01/04/2007), HYPERTENSION (01/04/2007), OVERACTIVE BLADDER (03/06/2007), and Prostate cancer (HCC) (02/04/2015).  Vitamin D  deficiency Last vitamin D  Lab Results  Component Value Date   VD25OH 45.37 10/17/2021   Stable, cont oral replacement   Primary hypertension BP Readings from Last 3 Encounters:  03/24/24 (!) 142/84  02/25/24 (!) 160/82  01/31/24 (!) 150/78   Uncontrolled here, but pt has documentation with controlled at home, pt to continue medical treatment norvasc  5 every day, lotensin  10 every day, lopressor  25 bid   OAB (overactive bladder) Mild uncontrolled, declines ua repeat but ok for increased vesicare  to 10 mg qd  Lumbar pain Now chronic persistent but remains mild overall, no exam change, now for tylenol  prn, hold on MRI or other imaging for now  Followup: Return in about 6 months (around 09/21/2024).  Lynwood Rush, MD 03/24/2024 2:42 PM [facility]

## 2024-03-24 NOTE — Patient Instructions (Signed)
 Ok to increase the solifenacin  to 10 mg per day  [facility] for tylenol  as needed for the lower back pain  Please continue all other medications as before, and refills have been done if requested.  Please have the pharmacy call with any other refills you may need.  Please continue your efforts at being more active, low cholesterol diet, and weight control.  Please keep your appointments with your specialists as you may have planned  Please make an Appointment to return in 6 months, or sooner if needed

## 2024-03-24 NOTE — Assessment & Plan Note (Signed)
 Mild uncontrolled, declines ua repeat but ok for increased vesicare  to 10 mg qd

## 2024-03-24 NOTE — Assessment & Plan Note (Signed)
 Now chronic persistent but remains mild overall, no exam change, now for tylenol  prn, hold on MRI or other imaging for now

## 2024-03-24 NOTE — Assessment & Plan Note (Signed)
 BP Readings from Last 3 Encounters:  03/24/24 (!) 142/84  02/25/24 (!) 160/82  01/31/24 (!) 150/78   Uncontrolled here, but pt has documentation with controlled at home, pt to continue medical treatment norvasc  5 every day, lotensin  10 every day, lopressor  25 bid

## 2024-03-24 NOTE — Assessment & Plan Note (Signed)
 Lab Results  Component Value Date   HGBA1C 6.2 05/29/2023   Stable, pt to continue current medical treatment  - diet, wt control

## 2024-04-08 ENCOUNTER — Ambulatory Visit
Admission: EM | Admit: 2024-04-08 | Discharge: 2024-04-08 | Disposition: A | Discharge status: Home / Self Care | Attending: Student | Admitting: Student

## 2024-04-08 ENCOUNTER — Encounter: Payer: Self-pay | Admitting: Emergency Medicine

## 2024-04-08 DIAGNOSIS — J069 Acute upper respiratory infection, unspecified: Secondary | ICD-10-CM

## 2024-04-08 LAB — POC COVID19/FLU A&B COMBO
Covid Antigen, POC: NEGATIVE
Influenza A Antigen, POC: NEGATIVE
Influenza B Antigen, POC: NEGATIVE

## 2024-04-08 MED ORDER — ALBUTEROL SULFATE HFA 108 (90 BASE) MCG/ACT IN AERS: 1.0000 | INHALATION_SPRAY | Freq: Four times a day (QID) | RESPIRATORY_TRACT | 0 refills | Status: AC | PRN

## 2024-04-08 MED ORDER — PROMETHAZINE-DM 6.25-15 MG/5ML PO SYRP: 5.0000 mL | ORAL_SOLUTION | Freq: Every evening | ORAL | 0 refills | Status: AC | PRN

## 2024-04-08 MED ORDER — PREDNISONE 10 MG PO TABS: 20.0000 mg | ORAL_TABLET | Freq: Every day | ORAL | 0 refills | Status: AC

## 2024-04-08 NOTE — ED Triage Notes (Signed)
 Pt c/o cough for 3 days. States not other symptoms but cough is worse at night.

## 2024-04-08 NOTE — Progress Notes (Signed)
 Cardiology Office Note:  .   Date:  04/09/2024  ID:  [name] [name], DOB [birth year], MRN [id number] PCP: Norleen Lynwood ORN, MD  [facility] Providers Cardiologist:  Shelda Bruckner, MD {  History of Present Illness: .   [name] [name] is a 73 y.o. male with a hx of SVT, hypertension, hyperlipidemia, GERD, BPH, and prostate cancer, who is seen for follow up today. I initially met him 05/17/22 as a new consult at the request of Norleen Lynwood ORN, MD for the evaluation and management of tachycardia.    FH: brother died of MI in his 46s. Mother had CHF.   BP history: Losartan  didn't help much.   Today: Reviewed last visit with [name] [name], felt poorly on diltiazem . This was stopped, and he was started on metoprolol  instead. He was also started on amlodipine  given labile blood pressures.  Has had intermittent fast heart rate episodes, had one on arrival here today. Longest episodes are up to an hour (has not been this long in some time), but sometimes can be a few minutes, average 5-10 minutes. No chest pain, shortness of breath, syncope with these, just feels tired.   Taking metoprolol  tartrate routinely twice a day and still having breakthrough SVT episodes  ROS: Denies chest pain, shortness of breath at rest. No PND, orthopnea, LE edema or unexpected weight gain. No syncope or palpitations. ROS otherwise negative except as noted.   Studies Reviewed: SABRA    EKG:  EKG Interpretation Date/Time:  Wednesday April 09 2024 12:26:44 EST Ventricular Rate:  120 PR Interval:    QRS Duration:  156 QT Interval:  340 QTC Calculation: 480 R Axis:   1  Text Interpretation: Supraventricular tachycardia Non-specific intra-ventricular conduction block Confirmed by Bruckner Shelda [phone]) on 04/13/2024 8:48:38 PM    Physical Exam:   VS:  BP 117/79 Comment: home  Pulse (!) 120   Ht 5' 7 (1.702 m)   Wt 209 lb (94.8 kg)   SpO2 97%   BMI 32.73 kg/m    Wt Readings from Last 3  Encounters:  04/09/24 209 lb (94.8 kg)  03/24/24 207 lb 2 oz (94 kg)  02/25/24 200 lb 6.4 oz (90.9 kg)    GEN: Well nourished, well developed in no acute distress HEENT: Normal, moist mucous membranes NECK: No JVD CARDIAC: regular rhythm, normal S1 and S2, no rubs or gallops. No murmur. VASCULAR: Radial and DP pulses 2+ bilaterally. No carotid bruits RESPIRATORY:  Largely clear with some rhonchi L lung.  ABDOMEN: Soft, non-tender, non-distended MUSCULOSKELETAL:  Ambulates independently SKIN: Warm and dry, no edema NEUROLOGIC:  Alert and oriented x 3. No focal neuro deficits noted. PSYCHIATRIC:  Normal affect    ASSESSMENT AND PLAN: .    Intermittent SVT, palpitations:  -lasting up to an hour, but typically several minutes -ECG today appears to have some retrograde P waves -we discussed updating monitor, referral to EP to discuss ablation. He has seen Dr. Waddell in the past -he does not wish to proceed with possible ablation at this time but will contact me for worsening symptoms -we reviewed vagal maneuvers today -he did not feel well on diltiazem , now on metoprolol  tartrate 25 mg BID (taking regularly, not PRN) -prior echo without significant abnormalities -prior MPI 2015, but no chest pain/anginal symptoms   Hypertension:  -has history of lability in his blood pressure -currently on amlodipine  5 mg daily, benazepril  10 mg daily, metoprolol  as above -BP at goal on recheck, and he reports  good numbers at home   Hypercholesterolemia: elevated ASCVD risk -LDL 153 05/29/2023, elevated from prior and no longer at goal (<100). He is unsure when his atorvastatin  dose was increased; appears this was done several months prior to his lab draw -currently on atorvastatin  40 mg daily, aspirin  81 mg daily (this is optional, but with hyperglycemia/nearing diabetes, reasonable to continue as he reports no bleeding issues) -discussed options today, including increasing dose or changing therapy.  Did not tolerate rosuvastatin  in the past. Discussed lifestyle recommendations. After shared decision making, prefers to make no changes at this time, will reassess at follow up  CV risk counseling and prevention -recommend heart healthy/Mediterranean diet, with whole grains, fruits, vegetable, fish, lean meats, nuts, and olive oil. Limit salt. -recommend moderate walking, 3-5 times/week for 30-50 minutes each session. Aim for at least 150 minutes.week. Goal should be pace of 3 miles/hours, or walking 1.5 miles in 30 minutes -recommend avoidance of tobacco products. Avoid excess alcohol. -ASCVD risk score: The 10-year ASCVD risk score (Arnett DK, et al., 2019) is: 17.3%   Values used to calculate the score:     Age: 48 years     Clincally relevant sex: Male     Is Non-Hispanic African American: Yes     Diabetic: No     Tobacco smoker: No     Systolic Blood Pressure: 117 mmHg     Is BP treated: Yes     HDL Cholesterol: 58 mg/dL     Total Cholesterol: 165 mg/dL    Dispo: 3 mos or sooner as needed  Signed, Shelda Bruckner, MD   Shelda Bruckner, MD, PhD, [facility]  [facility]  [place]  [facility] & Vascular at [facility] [address] [place], [place] [address] [phone]

## 2024-04-08 NOTE — ED Provider Notes (Signed)
 [facility]    CSN: [id number] Arrival date & time: 04/08/24  1334      History   Chief Complaint Chief Complaint  Patient presents with   Cough    HPI [name] [name] is a 73 y.o. male presenting for cough.  History of hypertension, GERD, OAB, GAD; denies h/o pulm ds. Pt c/o cough for 3 days. Cough is nonproductive. Cough is worse at night. Denies SOB. Has attempted nyquil. States was prescribed an inhaler for a past viral cough, which was helpful  HPI  Past Medical History:  Diagnosis Date   BENIGN PROSTATIC HYPERTROPHY 05/06/2010   COMMON MIGRAINE 07/31/2007   GERD 07/31/2007   HYPERLIPIDEMIA 01/04/2007   HYPERTENSION 01/04/2007   OVERACTIVE BLADDER 03/06/2007   Prostate cancer (HCC) 02/04/2015    Patient Active Problem List   Diagnosis Date Noted   Lumbar pain 03/24/2024   Hypercholesterolemia 06/01/2023   Right shoulder pain 11/06/2022   OAB (overactive bladder) 11/06/2022   SVT (supraventricular tachycardia) 07/06/2022   Anxiety 04/23/2022   Asthma 03/28/2022   Hypotension 12/11/2021   Diverticulosis of intestine with bleeding 12/11/2021   GI bleed 12/10/2021   Primary hypertension 12/10/2021   Acute blood loss anemia 12/10/2021   Lower GI bleeding 12/08/2021   Bradycardia 07/20/2021   Myofascial pain syndrome, cervical 04/19/2021   Vitamin D  deficiency 09/25/2019   Dysuria 05/15/2019   Palpitation 02/25/2019   Paroxysmal tachycardia (HCC) 02/25/2019   Tachycardia 01/14/2019   Generalized anxiety disorder with panic attacks 09/25/2018   Mid back pain on right side 05/14/2018   Hyperglycemia 03/26/2018   Low back pain 04/19/2017   Lightheadedness 10/26/2016   Eustachian tube dysfunction, right 08/17/2016   Allergic rhinitis 08/17/2016   Erectile dysfunction 08/17/2016   Diastolic dysfunction 05/17/2016   Syncope 04/04/2016   Weakness 03/26/2015   Malignant neoplasm of prostate (HCC) 02/23/2015   Chest pain 10/01/2013   Cough  08/04/2013   Asthmatic bronchitis 07/15/2013   Microhematuria 05/09/2012   Constipation 08/13/2011   Ventral hernia 08/13/2011   Umbilical hernia 08/13/2011   Preventative health care 04/25/2011   Nocturia 06/29/2010   BPH (benign prostatic hyperplasia) 05/06/2010   Backache 05/06/2010   FREQUENCY, URINARY 11/30/2008   GERD 07/31/2007    Past Surgical History:  Procedure Laterality Date   BIOPSY  12/11/2021   Procedure: BIOPSY;  Surgeon: Charlanne Groom, MD;  Location: [facility] ENDOSCOPY;  Service: Gastroenterology;;   COLONOSCOPY  2009   ESOPHAGOGASTRODUODENOSCOPY (EGD) WITH PROPOFOL  N/A 12/11/2021   Procedure: ESOPHAGOGASTRODUODENOSCOPY (EGD) WITH PROPOFOL ;  Surgeon: Charlanne Groom, MD;  Location: [facility] ENDOSCOPY;  Service: Gastroenterology;  Laterality: N/A;       Home Medications    Prior to Admission medications   Medication Sig Start Date End Date Taking? Authorizing Provider  albuterol  (VENTOLIN  HFA) 108 (90 Base) MCG/ACT inhaler Inhale 1-2 puffs into the lungs every 6 (six) hours as needed for wheezing or shortness of breath. 04/08/24  Yes Kedrick Mcnamee E, PA-C  predniSONE  (DELTASONE ) 10 MG tablet Take 2 tablets (20 mg total) by mouth daily for 5 days. 04/08/24 04/13/24 Yes Shakema Surita E, PA-C  promethazine-dextromethorphan (PROMETHAZINE-DM) 6.25-15 MG/5ML syrup Take 5 mLs by mouth at bedtime as needed for cough. 04/08/24  Yes Tonianne Fine E, PA-C  alfuzosin (UROXATRAL) 10 MG 24 hr tablet Take 10 mg by mouth every other day. 09/08/23   [provider]  amLODipine  (NORVASC ) 5 MG tablet Take 1 tablet (5 mg total) by mouth in the morning and  at bedtime. 02/25/24 05/25/24  Lonni Slain, MD  aspirin  81 MG tablet Take 1 tablet (81 mg total) by mouth daily. 12/18/21   Briana Elgin LABOR, MD  atorvastatin  (LIPITOR) 40 MG tablet TAKE 1 TABLET BY MOUTH EVERY DAY 12/31/23   Norleen Lynwood ORN, MD  benazepril  (LOTENSIN ) 10 MG tablet Take 1 tablet (10 mg total) by mouth daily. 01/09/24   Norleen Lynwood ORN, MD  Cholecalciferol  50 MCG (2000 UT) TABS 1 tab by mouth once daily 07/20/21   Norleen Lynwood ORN, MD  dorzolamide -timolol  (COSOPT ) 2-0.5 % ophthalmic solution INSTILL 1 DROP INTO BOTH EYES TWICE A DAY 08/01/23   Norleen Lynwood ORN, MD  ketorolac  (ACULAR ) 0.5 % ophthalmic solution Place 1 drop into the left eye 4 (four) times daily. 07/09/23   Norleen Lynwood ORN, MD  latanoprost  (XALATAN ) 0.005 % ophthalmic solution Place 1 drop into both eyes at bedtime. 11/09/17   [provider]  metoprolol  tartrate (LOPRESSOR ) 25 MG tablet Take 1 tablet (25 mg total) by mouth 2 (two) times daily as needed (palpitations). 03/03/24 06/01/24  Walker, Caitlin S, NP  polyethylene glycol (MIRALAX / GLYCOLAX) packet Take 17 g by mouth as needed for mild constipation.    [provider]  potassium chloride  (KLOR-CON ) 10 MEQ tablet TAKE 1 TABLET BY MOUTH EVERY DAY 01/28/24   Norleen Lynwood ORN, MD  [facility] injection  05/15/23   [provider]  sildenafil  (REVATIO ) 20 MG tablet 3 tabs by mouth once daily as needed 11/05/23   Norleen Lynwood ORN, MD  solifenacin  (VESICARE ) 10 MG tablet Take 1 tablet (10 mg total) by mouth daily. 03/24/24   John, James W, MD  SYSTANE ULTRA 0.4-0.3 % SOLN Place 1 drop into both eyes daily as needed (dryness). 08/14/19   [provider]    Family History Family History  Problem Relation Age of Onset   Lung cancer Father 72   Heart disease Brother 55       died from MI    Colon cancer Neg Hx    Esophageal cancer Neg Hx    Rectal cancer Neg Hx    Stomach cancer Neg Hx     Social History Social History   Tobacco Use   Smoking status: Never   Smokeless tobacco: Never  Vaping Use   Vaping status: Never Used  Substance Use Topics   Alcohol use: No    Alcohol/week: 0.0 standard drinks of alcohol   Drug use: No     Allergies   Beta adrenergic blockers, Celebrex [celecoxib], Crestor  [rosuvastatin ], Detrol  [tolterodine ], Ditropan  [oxybutynin ], Hydralazine , Rapaflo   [silodosin ], Tadalafil , Tamsulosin , and Toprol  xl [metoprolol ]   Review of Systems Review of Systems  Constitutional:  Negative for appetite change, chills and fever.  HENT:  Negative for congestion, ear pain, rhinorrhea, sinus pressure, sinus pain and sore throat.   Eyes:  Negative for redness and visual disturbance.  Respiratory:  Positive for cough. Negative for chest tightness, shortness of breath and wheezing.   Cardiovascular:  Negative for chest pain and palpitations.  Gastrointestinal:  Negative for abdominal pain, constipation, diarrhea, nausea and vomiting.  Genitourinary:  Negative for dysuria, frequency and urgency.  Musculoskeletal:  Negative for myalgias.  Neurological:  Negative for dizziness, weakness and headaches.  Psychiatric/Behavioral:  Negative for confusion.   All other systems reviewed and are negative.    Physical Exam Triage Vital Signs ED Triage Vitals  Encounter Vitals Group     BP      Girls Systolic  BP Percentile      Girls Diastolic BP Percentile      Boys Systolic BP Percentile      Boys Diastolic BP Percentile      Pulse      Resp      Temp      Temp src      SpO2      Weight      Height      Head Circumference      Peak Flow      Pain Score      Pain Loc      Pain Education      Exclude from Growth Chart    No data found.  Updated Vital Signs BP (!) 159/84 (BP Location: Left Arm)   Pulse 76   Temp 97.6 F (36.4 C) (Oral)   Resp 17   SpO2 94%   Visual Acuity Right Eye Distance:   Left Eye Distance:   Bilateral Distance:    Right Eye Near:   Left Eye Near:    Bilateral Near:     Physical Exam Vitals reviewed.  Constitutional:      General: He is not in acute distress.    Appearance: Normal appearance. He is not ill-appearing.  HENT:     Head: Normocephalic and atraumatic.     Right Ear: Tympanic membrane, ear canal and external ear normal. No tenderness. No middle ear effusion. There is no impacted cerumen. Tympanic  membrane is not perforated, erythematous, retracted or bulging.     Left Ear: Tympanic membrane, ear canal and external ear normal. No tenderness.  No middle ear effusion. There is no impacted cerumen. Tympanic membrane is not perforated, erythematous, retracted or bulging.     Nose: Nose normal. No congestion.     Mouth/Throat:     Mouth: Mucous membranes are moist.     Pharynx: Uvula midline. No oropharyngeal exudate or posterior oropharyngeal erythema.     Tonsils: No tonsillar exudate.  Eyes:     Extraocular Movements: Extraocular movements intact.     Pupils: Pupils are equal, round, and reactive to light.  Cardiovascular:     Rate and Rhythm: Normal rate and regular rhythm.     Heart sounds: Normal heart sounds.  Pulmonary:     Effort: Pulmonary effort is normal.     Breath sounds: Normal breath sounds. No decreased breath sounds, wheezing, rhonchi or rales.  Abdominal:     Palpations: Abdomen is soft.     Tenderness: There is no abdominal tenderness. There is no guarding or rebound.  Lymphadenopathy:     Cervical: No cervical adenopathy.     Right cervical: No superficial, deep or posterior cervical adenopathy.    Left cervical: No superficial, deep or posterior cervical adenopathy.  Skin:    Comments: No rash   Neurological:     General: No focal deficit present.     Mental Status: He is alert and oriented to person, place, and time.  Psychiatric:        Mood and Affect: Mood normal.        Behavior: Behavior normal.        Thought Content: Thought content normal.        Judgment: Judgment normal.      UC Treatments / Results  Labs (all labs ordered are listed, but only abnormal results are displayed) Labs Reviewed  POC COVID19/FLU A&B COMBO    EKG   Radiology No results found.  Procedures Procedures (including critical care time)  Medications Ordered in UC Medications - No data to display  Initial Impression / Assessment and Plan / UC Course  I have  reviewed the triage vital signs and the nursing notes.  Pertinent labs & imaging results that were available during my care of the patient were reviewed by me and considered in my medical decision making (see chart for details).     Patient is a 73 year old male presenting with viral URI with cough. The patient is afebrile and nontachycardic.  Antipyretic has not been administered today.  -Covid negative -Influenza negative  No diagnosis of kidney ds. Creatinine mildly elevated and GFR mildly reduced on 01/2024 BMP.  Will manage with low-dose prednisone , promethazine  DM. Also sent albuterol  inhaler, which he has taken during past URI, though he does not have a diagnosis of pulm ds and is not a smoker. Return precautions as below.   Final Clinical Impressions(s) / UC Diagnoses   Final diagnoses:  Viral URI with cough     Discharge Instructions      -Your covid, influenza tests were negative -Prednisone , 2 pills taken at the same time for 5 days in a row.  Try taking this earlier in the day as it can give you energy. Avoid NSAIDs like ibuprofen and alleve while taking this medication as they can increase your risk of stomach upset and even GI bleeding when in combination with a steroid. You can continue tylenol  (acetaminophen ) up to 1000mg  3x daily. -Albuterol  inhaler as needed for cough, wheezing, shortness of breath, 1 to 2 puffs every 6 hours as needed. -Promethazine  DM cough syrup for congestion/cough. This could make you drowsy, so take at night before bed. -Your cough should slowly get better instead of worse. If you develop a cough productive of dark or red sputum, new shortness of breath, new chest tightness, new fevers, etc - seek additional care.      ED Prescriptions     Medication Sig Dispense Auth. Provider   predniSONE  (DELTASONE ) 10 MG tablet Take 2 tablets (20 mg total) by mouth daily for 5 days. 10 tablet [name] Burd E, PA-C   promethazine -dextromethorphan  (PROMETHAZINE -DM) 6.25-15 MG/5ML syrup Take 5 mLs by mouth at bedtime as needed for cough. 118 mL [name] Kindel E, PA-C   albuterol  (VENTOLIN  HFA) 108 (90 Base) MCG/ACT inhaler Inhale 1-2 puffs into the lungs every 6 (six) hours as needed for wheezing or shortness of breath. 1 each [name] Leita BRAVO, PA-C      PDMP not reviewed this encounter.   [name] Leita BRAVO, PA-C 04/08/24 1423

## 2024-04-08 NOTE — Discharge Instructions (Addendum)
-  Your covid, influenza tests were negative -Prednisone , 2 pills taken at the same time for 5 days in a row.  Try taking this earlier in the day as it can give you energy. Avoid NSAIDs like ibuprofen and alleve while taking this medication as they can increase your risk of stomach upset and even GI bleeding when in combination with a steroid. You can continue tylenol  (acetaminophen ) up to 1000mg  3x daily. -Albuterol  inhaler as needed for cough, wheezing, shortness of breath, 1 to 2 puffs every 6 hours as needed. -Promethazine DM cough syrup for congestion/cough. This could make you drowsy, so take at night before bed. -Your cough should slowly get better instead of worse. If you develop a cough productive of dark or red sputum, new shortness of breath, new chest tightness, new fevers, etc - seek additional care.

## 2024-04-09 ENCOUNTER — Ambulatory Visit (INDEPENDENT_AMBULATORY_CARE_PROVIDER_SITE_OTHER): Admitting: Cardiology

## 2024-04-09 ENCOUNTER — Encounter (HOSPITAL_BASED_OUTPATIENT_CLINIC_OR_DEPARTMENT_OTHER): Payer: Self-pay | Admitting: Cardiology

## 2024-04-09 VITALS — BP 117/79 | HR 120 | Ht 67.0 in | Wt 209.0 lb

## 2024-04-09 DIAGNOSIS — H401131 Primary open-angle glaucoma, bilateral, mild stage: Secondary | ICD-10-CM | POA: Diagnosis not present

## 2024-04-09 DIAGNOSIS — I471 Supraventricular tachycardia, unspecified: Secondary | ICD-10-CM

## 2024-04-09 DIAGNOSIS — H35352 Cystoid macular degeneration, left eye: Secondary | ICD-10-CM | POA: Diagnosis not present

## 2024-04-09 DIAGNOSIS — E7800 Pure hypercholesterolemia, unspecified: Secondary | ICD-10-CM | POA: Diagnosis not present

## 2024-04-09 DIAGNOSIS — H35373 Puckering of macula, bilateral: Secondary | ICD-10-CM | POA: Diagnosis not present

## 2024-04-09 DIAGNOSIS — H0102A Squamous blepharitis right eye, upper and lower eyelids: Secondary | ICD-10-CM | POA: Diagnosis not present

## 2024-04-09 DIAGNOSIS — Z7189 Other specified counseling: Secondary | ICD-10-CM

## 2024-04-09 DIAGNOSIS — H02831 Dermatochalasis of right upper eyelid: Secondary | ICD-10-CM | POA: Diagnosis not present

## 2024-04-09 DIAGNOSIS — H02834 Dermatochalasis of left upper eyelid: Secondary | ICD-10-CM | POA: Diagnosis not present

## 2024-04-09 DIAGNOSIS — H26491 Other secondary cataract, right eye: Secondary | ICD-10-CM | POA: Diagnosis not present

## 2024-04-09 DIAGNOSIS — H0102B Squamous blepharitis left eye, upper and lower eyelids: Secondary | ICD-10-CM | POA: Diagnosis not present

## 2024-04-09 DIAGNOSIS — R0989 Other specified symptoms and signs involving the circulatory and respiratory systems: Secondary | ICD-10-CM

## 2024-04-09 DIAGNOSIS — Z961 Presence of intraocular lens: Secondary | ICD-10-CM | POA: Diagnosis not present

## 2024-04-09 NOTE — Patient Instructions (Addendum)
 Vagal maneuvers are other ways to try to manage SVT. Here is some information for the [facility]: What are vagal maneuvers? Vagal maneuvers are physical actions that make your vagus nerve act on your heart's natural pacemaker, slowing down its electrical impulses. Your vagus nerve -- which goes from your brainstem to your belly -- plays a major role in your parasympathetic nervous system, which controls a number of things in your body, including heart rate.  Healthcare providers can do vagal maneuvers when it makes sense for a person with a fast heart rate. Don't try these yourself without talking to your healthcare provider first.  Types of vagal maneuvers Healthcare providers often use these:  Valsalva maneuver (bearing down like you're having a bowel movement (pooping). See below). Diving reflex. Carotid sinus massage. Gag reflex. Coughing. Handstand for 30 seconds. (In one study, healthcare providers taught parents how to help their kids do this.) Applied abdominal pressure. (Try lying on your back and folding your lower body toward your face until your feet are past your head. Take a breath and strain for 20 to 30 seconds.) Why are vagal maneuvers used? Vagal maneuvers are a first-line (first choice) treatment for supraventricular tachycardia (SVT) (fast heart rate) because they're a low-risk, low-cost way to slow down a heart rate that's too fast. They can have a 20% to 40% success rate for getting certain fast heart rhythms (more than 100 beats a minute) back to normal rhythms.  Vagal maneuvers can also help your healthcare provider diagnose which type of arrhythmia (irregular or abnormal beat) you have, as certain types of heart rhythm disorders classically respond to this maneuver.  Who shouldn't have vagal maneuvers? Your healthcare provider will only use vagal maneuvers if you're considered stable. They won't do vagal maneuvers if you're unstable, meaning you have:  Low blood  pressure. Chest pain. Shortness of breath. A shortage of oxygen in your body. An inability to get enough blood to your organs. If you're unstable, your healthcare provider will do cardioversion (using medicine or an electrical shock) instead of vagal maneuvers. Anyone who's feeling unwell should go to an emergency room or call 911 immediately.  How commonly are vagal maneuvers used? Supraventricular tachycardia (SVT) is common in adults and children, and is the most common heart rhythm abnormality in children. An estimated 1 in 250 to 1 in 1,000 children have SVT. Since vagal maneuvers are the first treatment choice for SVT, they're commonly used.  Procedure Details What happens before vagal maneuvers? Your healthcare provider will do an electrocardiogram (EKG) to check your heart rhythm. They'll monitor your heart rate, blood pressure and oxygen level.  What happens during vagal maneuvers? Here's how healthcare providers do the three most common vagal maneuvers:  Diving reflex While sitting, you'll take several deep breaths, hold your breath and then quickly put your whole face into a container of ice water. Keep your face submerged as long as you can.  The alternative approach is putting a bag of ice water or an ice-cold, wet towel against your face.  Valsalva maneuver While lying on your back, take a deep breath and act like you're exhaling but with your nose and mouth closed for 10 to 30 seconds. It should feel like trying to breathe air out into a blocked straw.  In a modified version of this maneuver (which can work better than the original method), you can do this while sitting up and then have your healthcare provider quickly drop the part of the  bed supporting your upper body.  When they lower your bed, they bring your knees to your chest or put your legs in the air. Keep your legs in that position 30 to 45 seconds longer than holding your breath.  Another Valsalva technique  healthcare providers use for kids is to have them blow on their thumb without letting any air out.  Carotid sinus massage You'll lie on your back with your head turned to one side. Your healthcare provider will use their fingers to push on your carotid sinus for five to 10 seconds. If it doesn't work, they can try again after a minute or try the other side of your neck.  What happens after vagal maneuvers? Hopefully, the arrhythmia (irregular or abnormal beat) resolves. Your healthcare provider will do another electrocardiogram (EKG) to see if the vagal maneuver was successful at bringing your heart rhythm back to normal. If they try vagal maneuvers two or three times and they don't work, they can give you medication to treat your arrhythmia. Medical or electrical cardioversion is another treatment option.  If vagal maneuvers don't work, your healthcare provider may contact a cardiologist (heart specialist) to evaluate you.   Follow up in 3 months with Dr. Lonni, Reche Finder, NP or Rosaline Bane, NP

## 2024-04-13 ENCOUNTER — Encounter (HOSPITAL_BASED_OUTPATIENT_CLINIC_OR_DEPARTMENT_OTHER): Payer: Self-pay | Admitting: Cardiology

## 2024-05-21 ENCOUNTER — Encounter (HOSPITAL_BASED_OUTPATIENT_CLINIC_OR_DEPARTMENT_OTHER): Payer: Self-pay | Admitting: Cardiology

## 2024-05-21 DIAGNOSIS — I471 Supraventricular tachycardia, unspecified: Secondary | ICD-10-CM

## 2024-05-22 MED ORDER — METOPROLOL TARTRATE 25 MG PO TABS: 25.0000 mg | ORAL_TABLET | Freq: Three times a day (TID) | ORAL | Status: DC | PRN

## 2024-05-23 ENCOUNTER — Other Ambulatory Visit (HOSPITAL_BASED_OUTPATIENT_CLINIC_OR_DEPARTMENT_OTHER): Payer: Self-pay | Admitting: Family

## 2024-05-26 NOTE — Telephone Encounter (Signed)
 In accordance with refill protocols, please review and address the following requirements before this medication refill can be authorized:  Other testing  HR not in [place]

## 2024-06-09 MED ORDER — METOPROLOL SUCCINATE ER 50 MG PO TB24: 50.0000 mg | ORAL_TABLET | Freq: Every day | ORAL | 3 refills | Status: AC

## 2024-06-09 MED ORDER — METOPROLOL TARTRATE 25 MG PO TABS: 25.0000 mg | ORAL_TABLET | Freq: Every day | ORAL | 0 refills | Status: AC | PRN

## 2024-06-09 NOTE — Telephone Encounter (Signed)
 Called patient and reviewed the information from Dr. Lonni.  Start Toprol  XL 50 mg daily and use Lopressor  25 mg for daily as needed for breakthrough.  Pt voices understanding and agreement.  I also sent a message to EP scheduling as the patient has been has been referred but not yet scheduled.

## 2024-06-16 ENCOUNTER — Ambulatory Visit (HOSPITAL_BASED_OUTPATIENT_CLINIC_OR_DEPARTMENT_OTHER): Admitting: Cardiology

## 2024-06-26 ENCOUNTER — Ambulatory Visit (HOSPITAL_BASED_OUTPATIENT_CLINIC_OR_DEPARTMENT_OTHER): Admitting: Cardiology

## 2024-10-17 ENCOUNTER — Ambulatory Visit
